# Patient Record
Sex: Male | Born: 1960 | Race: Black or African American | Hispanic: No | Marital: Married | Smoking: Never smoker
Health system: Southern US, Community
[De-identification: ages and names within clinical notes are randomized; demographics above are authoritative.]

## PROBLEM LIST (undated history)

## (undated) DIAGNOSIS — J45909 Unspecified asthma, uncomplicated: Secondary | ICD-10-CM

## (undated) DIAGNOSIS — C801 Malignant (primary) neoplasm, unspecified: Secondary | ICD-10-CM

## (undated) DIAGNOSIS — K219 Gastro-esophageal reflux disease without esophagitis: Secondary | ICD-10-CM

## (undated) DIAGNOSIS — G4730 Sleep apnea, unspecified: Secondary | ICD-10-CM

## (undated) HISTORY — PX: TONSILLECTOMY: SUR1361

## (undated) HISTORY — DX: Unspecified asthma, uncomplicated: J45.909

---

## 2020-04-30 ENCOUNTER — Emergency Department (HOSPITAL_COMMUNITY)
Admission: EM | Admit: 2020-04-30 | Discharge: 2020-04-30 | Disposition: A | Discharge status: Home / Self Care | Payer: BC Managed Care – PPO | Attending: Emergency Medicine | Admitting: Emergency Medicine

## 2020-04-30 ENCOUNTER — Encounter (HOSPITAL_COMMUNITY): Payer: Self-pay | Admitting: Emergency Medicine

## 2020-04-30 ENCOUNTER — Other Ambulatory Visit: Payer: Self-pay

## 2020-04-30 ENCOUNTER — Emergency Department (HOSPITAL_COMMUNITY): Payer: BC Managed Care – PPO

## 2020-04-30 DIAGNOSIS — R221 Localized swelling, mass and lump, neck: Secondary | ICD-10-CM

## 2020-04-30 HISTORY — DX: Gastro-esophageal reflux disease without esophagitis: K21.9

## 2020-04-30 LAB — CBC WITH DIFFERENTIAL/PLATELET
Abs Immature Granulocytes: 0.03 10*3/uL (ref 0.00–0.07)
Basophils Absolute: 0.1 10*3/uL (ref 0.0–0.1)
Basophils Relative: 1 %
Eosinophils Absolute: 0.2 10*3/uL (ref 0.0–0.5)
Eosinophils Relative: 2 %
HCT: 46.2 % (ref 39.0–52.0)
Hemoglobin: 15.4 g/dL (ref 13.0–17.0)
Immature Granulocytes: 0 %
Lymphocytes Relative: 22 %
Lymphs Abs: 2 10*3/uL (ref 0.7–4.0)
MCH: 30.6 pg (ref 26.0–34.0)
MCHC: 33.3 g/dL (ref 30.0–36.0)
MCV: 91.8 fL (ref 80.0–100.0)
Monocytes Absolute: 0.8 10*3/uL (ref 0.1–1.0)
Monocytes Relative: 8 %
Neutro Abs: 6.3 10*3/uL (ref 1.7–7.7)
Neutrophils Relative %: 67 %
Platelets: 215 10*3/uL (ref 150–400)
RBC: 5.03 MIL/uL (ref 4.22–5.81)
RDW: 14.1 % (ref 11.5–15.5)
WBC: 9.4 10*3/uL (ref 4.0–10.5)
nRBC: 0 % (ref 0.0–0.2)

## 2020-04-30 LAB — BASIC METABOLIC PANEL
Anion gap: 9 (ref 5–15)
BUN: 13 mg/dL (ref 6–20)
CO2: 25 mmol/L (ref 22–32)
Calcium: 9.5 mg/dL (ref 8.9–10.3)
Chloride: 103 mmol/L (ref 98–111)
Creatinine, Ser: 1.18 mg/dL (ref 0.61–1.24)
GFR, Estimated: >60 mL/min (ref >60)
Glucose, Bld: 103 mg/dL — ABNORMAL HIGH (ref 70–99)
Potassium: 4.4 mmol/L (ref 3.5–5.1)
Sodium: 137 mmol/L (ref 135–145)

## 2020-04-30 LAB — HEPATIC FUNCTION PANEL
ALT: 24 U/L (ref 0–44)
AST: 23 U/L (ref 15–41)
Albumin: 3.8 g/dL (ref 3.5–5.0)
Alkaline Phosphatase: 52 U/L (ref 38–126)
Bilirubin, Direct: 0.2 mg/dL (ref 0.0–0.2)
Indirect Bilirubin: 0.9 mg/dL (ref 0.3–0.9)
Total Bilirubin: 1.1 mg/dL (ref 0.3–1.2)
Total Protein: 7.4 g/dL (ref 6.5–8.1)

## 2020-04-30 LAB — LIPASE, BLOOD: Lipase: 38 U/L (ref 11–51)

## 2020-04-30 MED ORDER — IOHEXOL 300 MG/ML  SOLN
75.0000 mL | Freq: Once | INTRAMUSCULAR | Status: AC | PRN
  Administered 2020-04-30: 75 mL via INTRAVENOUS

## 2020-04-30 MED ORDER — PREDNISONE 10 MG PO TABS: 20.0000 mg | ORAL_TABLET | Freq: Every day | ORAL | 0 refills | Status: DC

## 2020-04-30 MED ORDER — PREDNISONE 20 MG PO TABS
40.0000 mg | ORAL_TABLET | Freq: Once | ORAL | Status: AC
  Administered 2020-04-30: 40 mg via ORAL
  Filled 2020-04-30: qty 2

## 2020-04-30 NOTE — ED Notes (Signed)
Pt is alert and oriented, speech changes noted that sound like something is swollen in throat, denies any pain at this time but reports radiating pain intermittently from throat into head. Pain increases in evening and pt reports that this swelling began about 4-55months ago and has been getting worse and pain has been increasing, lately he hasz required ibuprofen to be able to sleep at night and has become difficult to understand when speaking on the phone.  Delay in seeking dx due to issues with getting his insurance in [place] after move from [place].

## 2020-04-30 NOTE — ED Triage Notes (Signed)
Patient coming from home. Complaint of neck swelling that has progressively become worse recently. Patient endorses intermittent pain with movement. VSS. NAD.

## 2020-04-30 NOTE — ED Notes (Signed)
Pt verbalized understanding of d/c instructions, medications and strict instructions when to return to ED as well as appointment for f/u with ENT. Pt ambulatory to [facility] with steady gait, NAD

## 2020-04-30 NOTE — ED Provider Notes (Signed)
[facility]ERGENCY DEPARTMENT Provider Note   CSN: [id number] Arrival date & time: 04/30/20  2440     History Chief Complaint  Patient presents with  . Abscess    Alexander [name] is a 60 y.o. male.  HPI   60 year old male history of GERD presents today complaining of left neck swelling.  He states has been present for several months.  The symptoms have worsened.  He feels like it began gradually.  He had some voice change with this.  He has also noted that he has some difficulty swallowing.  He is not having any difficulty breathing.  The mass has been painless.  He has not noted fever or chills.  He has not had anything similar in the past.  He has not sought any previous medical evaluation for this.  Patient is non-smoker and drinks very no alcohol.  He has no history of malignancy  Past Medical History:  Diagnosis Date  . GERD (gastroesophageal reflux disease)     There are no problems to display for this patient.   The histories are not reviewed yet. Please review them in the "History" navigator section and refresh this [place].     No family history on file.     Home Medications Prior to Admission medications   Not on File    Allergies    Patient has no known allergies.  Review of Systems   Review of Systems  Constitutional: Negative for fever and unexpected weight change.  HENT: Positive for trouble swallowing and voice change.   Eyes: Negative.   Respiratory: Negative.   Cardiovascular: Negative.   Gastrointestinal: Negative.   Endocrine: Negative.   Genitourinary: Negative.   Musculoskeletal: Negative.   Skin: Negative.   Allergic/Immunologic: Negative.   Neurological: Negative.   Hematological: Negative.   Psychiatric/Behavioral: Negative.   All other systems reviewed and are negative.   Physical Exam Updated Vital Signs BP (!) 152/84   Pulse 79   Temp 98.4 F (36.9 C) (Oral)   Resp 16   SpO2 97%   Physical  Exam Vitals and nursing note reviewed.  Constitutional:      General: He is not in acute distress.    Appearance: Normal appearance. He is obese.  HENT:     Head: Normocephalic.     Right Ear: External ear normal.     Left Ear: External ear normal.     Nose: Nose normal.     Mouth/Throat:     Mouth: Mucous membranes are moist.     Comments: Left soft palate some swelling No fluctuance or pus noted Eyes:     Extraocular Movements: Extraocular movements intact.     Pupils: Pupils are equal, round, and reactive to light.  Neck:     Comments: Left submandibular with induration palpable Cardiovascular:     Rate and Rhythm: Normal rate and regular rhythm.     Pulses: Normal pulses.  Pulmonary:     Effort: Pulmonary effort is normal.  Abdominal:     General: Abdomen is flat. Bowel sounds are normal.     Palpations: Abdomen is soft.  Musculoskeletal:        General: Normal range of motion.     Cervical back: Normal range of motion. No rigidity.  Skin:    Capillary Refill: Capillary refill takes less than 2 seconds.  Neurological:     General: No focal deficit present.     Mental Status: He is alert.  Psychiatric:  Mood and Affect: Mood normal.     ED Results / Procedures / Treatments   Labs (all labs ordered are listed, but only abnormal results are displayed) Labs Reviewed  BASIC METABOLIC PANEL - Abnormal; Notable for the following components:      Result Value   Glucose, Bld 103 (*)    All other components within normal limits  CBC WITH DIFFERENTIAL/PLATELET  HEPATIC FUNCTION PANEL  LIPASE, BLOOD    EKG None  Radiology No results found.  Procedures Procedures   Medications Ordered in ED Medications - No data to display  ED Course  I have reviewed the triage vital signs and the nursing notes.  Pertinent labs & imaging results that were available during my care of the patient were reviewed by me and considered in my medical decision making (see chart  for details).  Clinical Course as of 04/30/20 1521  Mon Apr 30, 2020  1119 CBC with Differential [DR]  1610 Basic metabolic panel(!) Cbc and bmet reviewed and normal [DR]    Clinical Course User Index [DR] Pattricia Boss, MD   MDM Rules/Calculators/A&P                          Discussed discussed CT results with radiology.  Reviewed CT results personally Consulted with Dr. [name]- Patient's airway appears clear he is speaking without difficulty.  Plan close follow-up  Dr. [name] will see in office at 1230 on Thursday Plan steroids Discussed symptoms that should have the patient return to the emergency department such as difficulty breathing. Final Clinical Impression(s) / ED Diagnoses Final diagnoses:  Neck mass    Rx / DC Orders ED Discharge Orders    None       Pattricia Boss, MD 04/30/20 1534

## 2020-04-30 NOTE — Discharge Instructions (Addendum)
Please follow-up with Dr. [name] as scheduled on Thursday at 17 Return to the emergency department you have any difficulty breathing

## 2020-05-03 ENCOUNTER — Other Ambulatory Visit: Payer: Self-pay

## 2020-05-03 ENCOUNTER — Ambulatory Visit (INDEPENDENT_AMBULATORY_CARE_PROVIDER_SITE_OTHER): Payer: PRIVATE HEALTH INSURANCE | Admitting: Otolaryngology

## 2020-05-03 DIAGNOSIS — D3702 Neoplasm of uncertain behavior of tongue: Secondary | ICD-10-CM

## 2020-05-03 NOTE — Progress Notes (Signed)
HPI: Alexander [name] is a 60 y.o. male who presents is referred by ED for evaluation of left neck mass and left base of tongue mass noted on recent CT scan performed in the ED.  Patient just recently moved here about a year ago from [place].  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  He had seen the ENT in [place] for nasal polyps.  More recently over the last 3 to 4 months he has noticed a nodule in the left neck that is gradually gotten little bit larger.  He denies any sore throat.  He has had no weight loss. He does have history of obstructive sleep apnea but presently does not wear nasal CPAP.[name] Patient does not smoke or drink.  Past Medical History:  Diagnosis Date  . GERD (gastroesophageal reflux disease)    No past surgical history on file. Social History   Socioeconomic History  . Marital status: Single    Spouse name: Not on file  . Number of children: Not on file  . Years of education: Not on file  . Highest education level: Not on file  Occupational History  . Not on file  Tobacco Use  . Smoking status: Not on file  . Smokeless tobacco: Not on file  Substance and Sexual Activity  . Alcohol use: Not on file  . Drug use: Not on file  . Sexual activity: Not on file  Other Topics Concern  . Not on file  Social History Narrative  . Not on file   Social Determinants of Health   Financial Resource Strain: Not on file  Food Insecurity: Not on file  Transportation Needs: Not on file  Physical Activity: Not on file  Stress: Not on file  Social Connections: Not on file   No family history on file. No Known Allergies Prior to Admission medications   Medication Sig Start Date End Date Taking? Authorizing Provider  albuterol (VENTOLIN HFA) 108 (90 Base) MCG/ACT inhaler Inhale 2 puffs into the lungs as needed for wheezing or shortness of breath.    [provider]  Ascorbic Acid (VITAMIN C PO) Take 1 tablet by mouth daily.    [provider]  cetirizine (ZYRTEC) 10  MG tablet Take 10 mg by mouth daily.    [provider]  fluticasone (FLONASE) 50 MCG/ACT nasal spray Place 1 spray into both nostrils daily.    [provider]  Fluticasone-Salmeterol (WIXELA INHUB) 100-50 MCG/DOSE AEPB Inhale 1 puff into the lungs daily.    [provider]  Multiple Vitamin (MULTIVITAMIN ADULT PO) Take 1 tablet by mouth daily.    [provider]  Multiple Vitamins-Minerals (ZINC PO) Take 1 tablet by mouth daily.    [provider]  omeprazole (PRILOSEC) 40 MG capsule Take 40 mg by mouth daily.    [provider]  predniSONE (DELTASONE) 10 MG tablet Take 2 tablets (20 mg total) by mouth daily. 04/30/20   Pattricia Boss, MD  predniSONE (DELTASONE) 10 MG tablet Take 2 tablets (20 mg total) by mouth daily. 04/30/20   Pattricia Boss, MD     Positive ROS: Otherwise negative  All other systems have been reviewed and were otherwise negative with the exception of those mentioned in the HPI and as above.  Physical Exam: Constitutional: Alert, well-appearing, no acute distress.  Presently no hoarseness or airway problems. Ears: External ears without lesions or tenderness. Ear canals are clear bilaterally with intact, clear TMs.  Nasal: External nose without lesions.  Septum with minimal deformity.  Mild rhinitis bilaterally.  On nasal endoscopy patient has small sinonasal polyps that are nonobstructing..  Oral: Lips and gums without lesions. Tongue and palate mucosa without lesions. Posterior oropharynx clear.  Patient has a large tongue base making evaluation of the posterior oropharynx little bit difficult. Fiberoptic laryngoscopy was performed through the right nostril.  He has some polyps within the nasal cavity but these are not obstructing.  The nasopharynx is clear.  The base of tongue reveals a mass and questionable ulceration involving the left side.  It distorts the epiglottis back a little bit posteriorly.  On evaluation [name]  epiglottis the vocal cords were clear bilaterally with symmetric vocal cord mobility. Neck: Patient with firm adenopathy in the left upper neck consistent with metastatic disease. Lungs: Clear to auscultation Cardiac exam: Regular rate and rhythm without murmur. Respiratory: Breathing comfortably  Skin: No facial/neck lesions or rash noted.  Laryngoscopy  Date/Time: 05/03/2020 6:34 PM Performed by: Rozetta Nunnery, MD Authorized by: Rozetta Nunnery, MD   Consent:    Consent obtained:  Verbal   Consent given by:  Patient Procedure details:    Indications: direct visualization of the upper aerodigestive tract     Medication:  Afrin   Instrument: flexible fiberoptic laryngoscope     Scope location: right nare   Sinus:    Right nasopharynx: normal   Throat:    True vocal cords: normal   Comments:     On fiberoptic laryngoscopy through the right nostril the nasopharynx was clear.  The base of tongue was full on the left side and the epiglottis was pushed back posteriorly a little bit with questionable ulcer on the left base of tongue extending down to the left vallecular area.    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  Plan: We will schedule patient for direct laryngoscopy and biopsy next week.  Reviewed with the patient concerning probability of HPV positive squamous cell carcinoma of the base of tongue. We will also plan on scheduling patient for a PET scan. I reviewed the clinical findings and CT scan findings with the patient as well as his daughter.   Radene Journey, MD   CC:

## 2020-05-04 ENCOUNTER — Other Ambulatory Visit (INDEPENDENT_AMBULATORY_CARE_PROVIDER_SITE_OTHER): Payer: Self-pay

## 2020-05-04 DIAGNOSIS — D3702 Neoplasm of uncertain behavior of tongue: Secondary | ICD-10-CM

## 2020-05-07 ENCOUNTER — Other Ambulatory Visit (HOSPITAL_COMMUNITY)
Admission: RE | Admit: 2020-05-07 | Discharge: 2020-05-07 | Disposition: A | Discharge status: Home / Self Care | Payer: BC Managed Care – PPO | Source: Ambulatory Visit | Attending: Otolaryngology | Admitting: Otolaryngology

## 2020-05-07 ENCOUNTER — Ambulatory Visit: Payer: Self-pay | Admitting: Otolaryngology

## 2020-05-07 DIAGNOSIS — Z20822 Contact with and (suspected) exposure to covid-19: Secondary | ICD-10-CM | POA: Insufficient documentation

## 2020-05-07 DIAGNOSIS — Z01812 Encounter for preprocedural laboratory examination: Secondary | ICD-10-CM | POA: Insufficient documentation

## 2020-05-07 NOTE — H&P (View-Only) (Signed)
PREOPERATIVE H&P  Chief Complaint: Left neck and left base of tongue mass  HPI: Alexander [name] is a 60 y.o. male who presents for evaluation of left neck and left base of tongue mass.  Patient was referred by the ED because of an enlarged left neck mass left base of tongue mass with a CT scan demonstrated a large base of tongue mass on the left side and enlarged left neck node consistent with possible carcinoma.  On evaluation in the office patient has a large left neck lymphadenopathy as well as a large partially ulcerative left base of tongue mass.  This was unable to be biopsied in the office.  Patient is taken to the operating room for direct laryngoscopy and biopsy.  Past Medical History:  Diagnosis Date  . GERD (gastroesophageal reflux disease)    No past surgical history on file. Social History   Socioeconomic History  . Marital status: Single    Spouse name: Not on file  . Number of children: Not on file  . Years of education: Not on file  . Highest education level: Not on file  Occupational History  . Not on file  Tobacco Use  . Smoking status: Not on file  . Smokeless tobacco: Not on file  Substance and Sexual Activity  . Alcohol use: Not on file  . Drug use: Not on file  . Sexual activity: Not on file  Other Topics Concern  . Not on file  Social History Narrative  . Not on file   Social Determinants of Health   Financial Resource Strain: Not on file  Food Insecurity: Not on file  Transportation Needs: Not on file  Physical Activity: Not on file  Stress: Not on file  Social Connections: Not on file   No family history on file. No Known Allergies Prior to Admission medications   Medication Sig Start Date End Date Taking? Authorizing Provider  albuterol (VENTOLIN HFA) 108 (90 Base) MCG/ACT inhaler Inhale 2 puffs into the lungs as needed for wheezing or shortness of breath.    [provider]  Ascorbic Acid (VITAMIN C PO) Take 1 tablet by mouth daily.     [provider]  cetirizine (ZYRTEC) 10 MG tablet Take 10 mg by mouth daily.    [provider]  fluticasone (FLONASE) 50 MCG/ACT nasal spray Place 1 spray into both nostrils daily.    [provider]  Fluticasone-Salmeterol (WIXELA INHUB) 100-50 MCG/DOSE AEPB Inhale 1 puff into the lungs daily.    [provider]  Multiple Vitamin (MULTIVITAMIN ADULT PO) Take 1 tablet by mouth daily.    [provider]  Multiple Vitamins-Minerals (ZINC PO) Take 1 tablet by mouth daily.    [provider]  omeprazole (PRILOSEC) 40 MG capsule Take 40 mg by mouth daily.    [provider]  predniSONE (DELTASONE) 10 MG tablet Take 2 tablets (20 mg total) by mouth daily. 04/30/20   Pattricia Boss, MD  predniSONE (DELTASONE) 10 MG tablet Take 2 tablets (20 mg total) by mouth daily. 04/30/20   Pattricia Boss, MD     Positive ROS: Otherwise negative  All other systems have been reviewed and were otherwise negative with the exception of those mentioned in the HPI and as above.  Physical Exam: There were no vitals filed for this visit.  General: Alert, no acute distress Oral: Normal oral mucosa and tonsils.  Patient with a large base of tongue making visualization a little difficult but on fiberoptic laryngoscopy  patient has a mass involving mostly the left side of the base of tongue with some ulceration.  Epiglottis is partially pushed back posteriorly.  Vocal cords were clear with normal vocal mobility. Nasal: Clear nasal passages Neck: No palpable adenopathy or thyroid nodules.  Enlarged left upper neck nodes. Ear: Ear canal is clear with normal appearing TMs Cardiovascular: Regular rate and rhythm, no murmur.  Respiratory: Clear to auscultation Neurologic: Alert and oriented x 3   Assessment/Plan: Left base of tongue mass with left neck lymphadenopathy consistent with probable neoplasm.  Plan for direct laryngoscopy and biopsy of the base of  tongue mass.   Melony Overly, MD 05/07/2020 12:41 PM

## 2020-05-07 NOTE — H&P (Signed)
PREOPERATIVE H&P  Chief Complaint: Left neck and left base of tongue mass  HPI: Alexander [name] is a 60 y.o. male who presents for evaluation of left neck and left base of tongue mass.  Patient was referred by the ED because of an enlarged left neck mass left base of tongue mass with a CT scan demonstrated a large base of tongue mass on the left side and enlarged left neck node consistent with possible carcinoma.  On evaluation in the office patient has a large left neck lymphadenopathy as well as a large partially ulcerative left base of tongue mass.  This was unable to be biopsied in the office.  Patient is taken to the operating room for direct laryngoscopy and biopsy.  Past Medical History:  Diagnosis Date  . GERD (gastroesophageal reflux disease)    No past surgical history on file. Social History   Socioeconomic History  . Marital status: Single    Spouse name: Not on file  . Number of children: Not on file  . Years of education: Not on file  . Highest education level: Not on file  Occupational History  . Not on file  Tobacco Use  . Smoking status: Not on file  . Smokeless tobacco: Not on file  Substance and Sexual Activity  . Alcohol use: Not on file  . Drug use: Not on file  . Sexual activity: Not on file  Other Topics Concern  . Not on file  Social History Narrative  . Not on file   Social Determinants of Health   Financial Resource Strain: Not on file  Food Insecurity: Not on file  Transportation Needs: Not on file  Physical Activity: Not on file  Stress: Not on file  Social Connections: Not on file   No family history on file. No Known Allergies Prior to Admission medications   Medication Sig Start Date End Date Taking? Authorizing Provider  albuterol (VENTOLIN HFA) 108 (90 Base) MCG/ACT inhaler Inhale 2 puffs into the lungs as needed for wheezing or shortness of breath.    [provider]  Ascorbic Acid (VITAMIN C PO) Take 1 tablet by mouth daily.     [provider]  cetirizine (ZYRTEC) 10 MG tablet Take 10 mg by mouth daily.    [provider]  fluticasone (FLONASE) 50 MCG/ACT nasal spray Place 1 spray into both nostrils daily.    [provider]  Fluticasone-Salmeterol (WIXELA INHUB) 100-50 MCG/DOSE AEPB Inhale 1 puff into the lungs daily.    [provider]  Multiple Vitamin (MULTIVITAMIN ADULT PO) Take 1 tablet by mouth daily.    [provider]  Multiple Vitamins-Minerals (ZINC PO) Take 1 tablet by mouth daily.    [provider]  omeprazole (PRILOSEC) 40 MG capsule Take 40 mg by mouth daily.    [provider]  predniSONE (DELTASONE) 10 MG tablet Take 2 tablets (20 mg total) by mouth daily. 04/30/20   Pattricia Boss, MD  predniSONE (DELTASONE) 10 MG tablet Take 2 tablets (20 mg total) by mouth daily. 04/30/20   Pattricia Boss, MD     Positive ROS: Otherwise negative  All other systems have been reviewed and were otherwise negative with the exception of those mentioned in the HPI and as above.  Physical Exam: There were no vitals filed for this visit.  General: Alert, no acute distress Oral: Normal oral mucosa and tonsils.  Patient with a large base of tongue making visualization a little difficult but on fiberoptic laryngoscopy  patient has a mass involving mostly the left side of the base of tongue with some ulceration.  Epiglottis is partially pushed back posteriorly.  Vocal cords were clear with normal vocal mobility. Nasal: Clear nasal passages Neck: No palpable adenopathy or thyroid nodules.  Enlarged left upper neck nodes. Ear: Ear canal is clear with normal appearing TMs Cardiovascular: Regular rate and rhythm, no murmur.  Respiratory: Clear to auscultation Neurologic: Alert and oriented x 3   Assessment/Plan: Left base of tongue mass with left neck lymphadenopathy consistent with probable neoplasm.  Plan for direct laryngoscopy and biopsy of the base of  tongue mass.   Melony Overly, MD 05/07/2020 12:41 PM

## 2020-05-08 ENCOUNTER — Other Ambulatory Visit: Payer: Self-pay

## 2020-05-08 ENCOUNTER — Encounter (HOSPITAL_COMMUNITY): Payer: Self-pay | Admitting: Otolaryngology

## 2020-05-08 LAB — SARS CORONAVIRUS 2 (TAT 6-24 HRS): SARS Coronavirus 2: NEGATIVE

## 2020-05-08 MED ORDER — DEXTROSE 5 % IV SOLN
3.0000 g | INTRAVENOUS | Status: DC
  Filled 2020-05-08: qty 3000

## 2020-05-08 NOTE — Progress Notes (Signed)
PCP - denies - just moved from [place] and has not been able to establish new providers yet d/t covid delays Cardiologist - per pt, has some cardiology work-ups in [place] and will bring copies tomorrow to add into chart - per pt no significant heart hx and is not on any cardiac medications  Chest x-ray -  EKG -  Stress Test -  ECHO -  Cardiac Cath -   COVID TEST- 05/07/20  Anesthesia review: n/a  -------------  SDW INSTRUCTIONS:  Your procedure is scheduled on 05/09/20. Please report to [facility] Main Entrance "A" at 10:45 A.M., and check in at the Admitting office. Call this number if you have problems the morning of surgery: [phone]   Remember: Do not eat or drink after midnight the night before your surgery   Medications to take morning of surgery with a sip of water include: cetirizine (ZYRTEC)  predniSONE (DELTASONE)  omeprazole (PRILOSEC)  fluticasone (FLONASE)   If needed: albuterol (VENTOLIN HFA)    As of today, STOP taking any Aspirin (unless otherwise instructed by your surgeon), Aleve, Naproxen, Ibuprofen, Motrin, Advil, Goody's, BC's, all herbal medications, fish oil, and all vitamins.    The Morning of Surgery Do not wear jewelry Do not wear lotions, powders, colognes, or deodorant Men may shave face and neck. Do not bring valuables to the hospital. [facility] is not responsible for any belongings or valuables. If you are a smoker, DO NOT Smoke 24 hours prior to surgery If you wear a CPAP at night please bring your mask the morning of surgery  Remember that you must have someone to transport you home after your surgery, and remain with you for 24 hours if you are discharged the same day. Please bring cases for contacts, glasses, hearing aids, dentures or bridgework because it cannot be worn into surgery.   Patients discharged the day of surgery will not be allowed to drive home.   Please shower the NIGHT BEFORE SURGERY and the MORNING OF SURGERY with DIAL  Soap. Wear comfortable clothes the morning of surgery. Oral Hygiene is also important to reduce your risk of infection.  Remember - BRUSH YOUR TEETH THE MORNING OF SURGERY WITH YOUR REGULAR TOOTHPASTE  Patient denies shortness of breath, fever, cough and chest pain.

## 2020-05-09 ENCOUNTER — Inpatient Hospital Stay (HOSPITAL_COMMUNITY)
Admission: AD | Admit: 2020-05-09 | Discharge: 2020-05-12 | DRG: 011 | Disposition: A | Discharge status: Home / Self Care | Payer: BC Managed Care – PPO | Attending: Internal Medicine | Admitting: Internal Medicine

## 2020-05-09 ENCOUNTER — Encounter (HOSPITAL_COMMUNITY)
Admission: AD | Disposition: A | Discharge status: Home / Self Care | Payer: Self-pay | Source: Home / Self Care | Attending: Internal Medicine

## 2020-05-09 ENCOUNTER — Encounter (HOSPITAL_COMMUNITY): Payer: Self-pay | Admitting: Otolaryngology

## 2020-05-09 ENCOUNTER — Ambulatory Visit (HOSPITAL_COMMUNITY): Payer: BC Managed Care – PPO | Admitting: Anesthesiology

## 2020-05-09 DIAGNOSIS — R001 Bradycardia, unspecified: Secondary | ICD-10-CM | POA: Diagnosis present

## 2020-05-09 DIAGNOSIS — Z79899 Other long term (current) drug therapy: Secondary | ICD-10-CM | POA: Diagnosis not present

## 2020-05-09 DIAGNOSIS — K148 Other diseases of tongue: Secondary | ICD-10-CM | POA: Diagnosis not present

## 2020-05-09 DIAGNOSIS — D72829 Elevated white blood cell count, unspecified: Secondary | ICD-10-CM | POA: Diagnosis present

## 2020-05-09 DIAGNOSIS — E669 Obesity, unspecified: Secondary | ICD-10-CM | POA: Diagnosis present

## 2020-05-09 DIAGNOSIS — R634 Abnormal weight loss: Secondary | ICD-10-CM | POA: Diagnosis not present

## 2020-05-09 DIAGNOSIS — R590 Localized enlarged lymph nodes: Secondary | ICD-10-CM | POA: Diagnosis present

## 2020-05-09 DIAGNOSIS — C01 Malignant neoplasm of base of tongue: Secondary | ICD-10-CM | POA: Diagnosis present

## 2020-05-09 DIAGNOSIS — Z6836 Body mass index (BMI) 36.0-36.9, adult: Secondary | ICD-10-CM

## 2020-05-09 DIAGNOSIS — Z7952 Long term (current) use of systemic steroids: Secondary | ICD-10-CM | POA: Diagnosis not present

## 2020-05-09 DIAGNOSIS — G4733 Obstructive sleep apnea (adult) (pediatric): Secondary | ICD-10-CM | POA: Diagnosis present

## 2020-05-09 DIAGNOSIS — Z8042 Family history of malignant neoplasm of prostate: Secondary | ICD-10-CM

## 2020-05-09 DIAGNOSIS — Z7951 Long term (current) use of inhaled steroids: Secondary | ICD-10-CM

## 2020-05-09 DIAGNOSIS — K219 Gastro-esophageal reflux disease without esophagitis: Secondary | ICD-10-CM | POA: Diagnosis present

## 2020-05-09 DIAGNOSIS — Z430 Encounter for attention to tracheostomy: Secondary | ICD-10-CM

## 2020-05-09 DIAGNOSIS — C109 Malignant neoplasm of oropharynx, unspecified: Secondary | ICD-10-CM | POA: Diagnosis not present

## 2020-05-09 DIAGNOSIS — J9601 Acute respiratory failure with hypoxia: Secondary | ICD-10-CM | POA: Diagnosis present

## 2020-05-09 DIAGNOSIS — Z20822 Contact with and (suspected) exposure to covid-19: Secondary | ICD-10-CM | POA: Diagnosis present

## 2020-05-09 DIAGNOSIS — J988 Other specified respiratory disorders: Secondary | ICD-10-CM | POA: Diagnosis present

## 2020-05-09 DIAGNOSIS — J45909 Unspecified asthma, uncomplicated: Secondary | ICD-10-CM | POA: Diagnosis present

## 2020-05-09 DIAGNOSIS — R221 Localized swelling, mass and lump, neck: Secondary | ICD-10-CM | POA: Diagnosis not present

## 2020-05-09 DIAGNOSIS — C029 Malignant neoplasm of tongue, unspecified: Secondary | ICD-10-CM

## 2020-05-09 DIAGNOSIS — Z930 Tracheostomy status: Secondary | ICD-10-CM

## 2020-05-09 DIAGNOSIS — R1319 Other dysphagia: Secondary | ICD-10-CM | POA: Diagnosis not present

## 2020-05-09 HISTORY — PX: MASS EXCISION: SHX2000

## 2020-05-09 HISTORY — PX: TRACHEOSTOMY TUBE PLACEMENT: SHX814

## 2020-05-09 HISTORY — DX: Sleep apnea, unspecified: G47.30

## 2020-05-09 HISTORY — PX: DIRECT LARYNGOSCOPY: SHX5326

## 2020-05-09 LAB — GLUCOSE, CAPILLARY
Glucose-Capillary: 131 mg/dL — ABNORMAL HIGH (ref 70–99)
Glucose-Capillary: 136 mg/dL — ABNORMAL HIGH (ref 70–99)
Glucose-Capillary: 143 mg/dL — ABNORMAL HIGH (ref 70–99)

## 2020-05-09 LAB — MRSA PCR SCREENING: MRSA by PCR: NEGATIVE

## 2020-05-09 SURGERY — EXCISION MASS
Anesthesia: General | Site: Throat

## 2020-05-09 MED ORDER — FENTANYL CITRATE (PF) 100 MCG/2ML IJ SOLN
INTRAMUSCULAR | Status: DC | PRN
  Administered 2020-05-09 (×3): 50 ug via INTRAVENOUS
  Administered 2020-05-09: 150 ug via INTRAVENOUS
  Administered 2020-05-09: 50 ug via INTRAVENOUS

## 2020-05-09 MED ORDER — CHLORHEXIDINE GLUCONATE CLOTH 2 % EX PADS: 6.0000 | MEDICATED_PAD | Freq: Once | CUTANEOUS | Status: DC

## 2020-05-09 MED ORDER — MIDAZOLAM HCL 2 MG/2ML IJ SOLN
INTRAMUSCULAR | Status: AC
  Filled 2020-05-09: qty 2

## 2020-05-09 MED ORDER — PANTOPRAZOLE SODIUM 40 MG IV SOLR
40.0000 mg | Freq: Two times a day (BID) | INTRAVENOUS | Status: DC
  Administered 2020-05-09: 40 mg via INTRAVENOUS
  Filled 2020-05-09: qty 40

## 2020-05-09 MED ORDER — CEFAZOLIN SODIUM-DEXTROSE 1-4 GM/50ML-% IV SOLN
1.0000 g | Freq: Three times a day (TID) | INTRAVENOUS | Status: DC
  Administered 2020-05-09 – 2020-05-10 (×2): 1 g via INTRAVENOUS
  Filled 2020-05-09 (×3): qty 50

## 2020-05-09 MED ORDER — FENTANYL CITRATE (PF) 100 MCG/2ML IJ SOLN
25.0000 ug | INTRAMUSCULAR | Status: DC | PRN
  Administered 2020-05-09: 25 ug via INTRAVENOUS

## 2020-05-09 MED ORDER — ALBUTEROL SULFATE HFA 108 (90 BASE) MCG/ACT IN AERS
2.0000 | INHALATION_SPRAY | RESPIRATORY_TRACT | Status: DC | PRN
  Filled 2020-05-09: qty 6.7

## 2020-05-09 MED ORDER — SUGAMMADEX SODIUM 200 MG/2ML IV SOLN
INTRAVENOUS | Status: DC | PRN
  Administered 2020-05-09: 400 mg via INTRAVENOUS

## 2020-05-09 MED ORDER — LIDOCAINE-EPINEPHRINE 1 %-1:100000 IJ SOLN
INTRAMUSCULAR | Status: DC | PRN
  Administered 2020-05-09: 10 mL

## 2020-05-09 MED ORDER — GLYCOPYRROLATE PF 0.2 MG/ML IJ SOSY
PREFILLED_SYRINGE | INTRAMUSCULAR | Status: AC
  Filled 2020-05-09: qty 1

## 2020-05-09 MED ORDER — OXYCODONE HCL 5 MG/5ML PO SOLN
5.0000 mg | Freq: Once | ORAL | Status: DC | PRN
Start: 2020-05-09 — End: 2020-05-09

## 2020-05-09 MED ORDER — IBUPROFEN 100 MG/5ML PO SUSP: 400.0000 mg | Freq: Four times a day (QID) | ORAL | Status: DC | PRN

## 2020-05-09 MED ORDER — FENTANYL CITRATE (PF) 100 MCG/2ML IJ SOLN: 50.0000 ug | INTRAMUSCULAR | Status: DC | PRN

## 2020-05-09 MED ORDER — PANTOPRAZOLE SODIUM 40 MG PO TBEC: 40.0000 mg | DELAYED_RELEASE_TABLET | Freq: Every day | ORAL | Status: DC

## 2020-05-09 MED ORDER — EPINEPHRINE 1 MG/10ML IJ SOSY
PREFILLED_SYRINGE | INTRAMUSCULAR | Status: DC | PRN
  Administered 2020-05-09 (×3): .1 mg via INTRAVENOUS

## 2020-05-09 MED ORDER — DEXTROSE 5 % IV SOLN
INTRAVENOUS | Status: DC | PRN
  Administered 2020-05-09: 3 g via INTRAVENOUS

## 2020-05-09 MED ORDER — DEXMEDETOMIDINE (PRECEDEX) IN NS 20 MCG/5ML (4 MCG/ML) IV SYRINGE
PREFILLED_SYRINGE | INTRAVENOUS | Status: DC | PRN
  Administered 2020-05-09: 12 ug via INTRAVENOUS
  Administered 2020-05-09: 8 ug via INTRAVENOUS

## 2020-05-09 MED ORDER — HYDROMORPHONE HCL 1 MG/ML IJ SOLN: 0.2000 mg | INTRAMUSCULAR | Status: DC | PRN

## 2020-05-09 MED ORDER — ALBUMIN HUMAN 5 % IV SOLN: INTRAVENOUS | Status: DC | PRN

## 2020-05-09 MED ORDER — FENTANYL CITRATE (PF) 100 MCG/2ML IJ SOLN
INTRAMUSCULAR | Status: AC
  Filled 2020-05-09: qty 2

## 2020-05-09 MED ORDER — DEXAMETHASONE SODIUM PHOSPHATE 10 MG/ML IJ SOLN
INTRAMUSCULAR | Status: DC | PRN
  Administered 2020-05-09: 10 mg via INTRAVENOUS

## 2020-05-09 MED ORDER — HYDROCODONE-ACETAMINOPHEN 7.5-325 MG/15ML PO SOLN: 10.0000 mL | ORAL | Status: DC | PRN

## 2020-05-09 MED ORDER — ORAL CARE MOUTH RINSE: 15.0000 mL | Freq: Once | OROMUCOSAL | Status: AC

## 2020-05-09 MED ORDER — OXYCODONE HCL 5 MG PO TABS
5.0000 mg | ORAL_TABLET | Freq: Once | ORAL | Status: DC | PRN
Start: 2020-05-09 — End: 2020-05-09

## 2020-05-09 MED ORDER — MORPHINE SULFATE (PF) 2 MG/ML IV SOLN: 2.0000 mg | INTRAVENOUS | Status: DC | PRN

## 2020-05-09 MED ORDER — PHENYLEPHRINE HCL-NACL 10-0.9 MG/250ML-% IV SOLN
INTRAVENOUS | Status: DC | PRN
  Administered 2020-05-09: 50 ug/min via INTRAVENOUS

## 2020-05-09 MED ORDER — FENTANYL CITRATE (PF) 250 MCG/5ML IJ SOLN
INTRAMUSCULAR | Status: AC
  Filled 2020-05-09: qty 5

## 2020-05-09 MED ORDER — KCL IN DEXTROSE-NACL 20-5-0.45 MEQ/L-%-% IV SOLN
INTRAVENOUS | Status: DC
  Filled 2020-05-09: qty 1000

## 2020-05-09 MED ORDER — TRANEXAMIC ACID FOR INHALATION
500.0000 mg | Freq: Two times a day (BID) | RESPIRATORY_TRACT | Status: DC | PRN
  Filled 2020-05-09: qty 10

## 2020-05-09 MED ORDER — ONDANSETRON HCL 4 MG/2ML IJ SOLN: 4.0000 mg | INTRAMUSCULAR | Status: DC | PRN

## 2020-05-09 MED ORDER — PHENYLEPHRINE 40 MCG/ML (10ML) SYRINGE FOR IV PUSH (FOR BLOOD PRESSURE SUPPORT)
PREFILLED_SYRINGE | INTRAVENOUS | Status: DC | PRN
  Administered 2020-05-09 (×2): 120 ug via INTRAVENOUS
  Administered 2020-05-09 (×2): 80 ug via INTRAVENOUS

## 2020-05-09 MED ORDER — STERILE WATER FOR IRRIGATION IR SOLN
Status: DC | PRN
  Administered 2020-05-09: 1000 mL

## 2020-05-09 MED ORDER — BACITRACIN ZINC 500 UNIT/GM EX OINT
1.0000 "application " | TOPICAL_OINTMENT | Freq: Three times a day (TID) | CUTANEOUS | Status: DC
  Administered 2020-05-09 – 2020-05-12 (×9): 1 via TOPICAL
  Filled 2020-05-09: qty 28.4

## 2020-05-09 MED ORDER — LIDOCAINE 2% (20 MG/ML) 5 ML SYRINGE
INTRAMUSCULAR | Status: DC | PRN
  Administered 2020-05-09: 60 mg via INTRAVENOUS

## 2020-05-09 MED ORDER — SUCCINYLCHOLINE CHLORIDE 200 MG/10ML IV SOSY
PREFILLED_SYRINGE | INTRAVENOUS | Status: DC | PRN
  Administered 2020-05-09: 200 mg via INTRAVENOUS

## 2020-05-09 MED ORDER — OXYMETAZOLINE HCL 0.05 % NA SOLN
NASAL | Status: AC
  Filled 2020-05-09: qty 30

## 2020-05-09 MED ORDER — CHLORHEXIDINE GLUCONATE 0.12 % MT SOLN
15.0000 mL | Freq: Once | OROMUCOSAL | Status: AC
  Administered 2020-05-09: 15 mL via OROMUCOSAL
  Filled 2020-05-09: qty 15

## 2020-05-09 MED ORDER — LACTATED RINGERS IV SOLN: INTRAVENOUS | Status: DC

## 2020-05-09 MED ORDER — PROPOFOL 10 MG/ML IV BOLUS
INTRAVENOUS | Status: DC | PRN
  Administered 2020-05-09: 40 mg via INTRAVENOUS
  Administered 2020-05-09: 150 mg via INTRAVENOUS

## 2020-05-09 MED ORDER — ACETAMINOPHEN 500 MG PO TABS: 1000.0000 mg | ORAL_TABLET | Freq: Once | ORAL | Status: DC

## 2020-05-09 MED ORDER — PROPOFOL 10 MG/ML IV BOLUS
INTRAVENOUS | Status: AC
  Filled 2020-05-09: qty 20

## 2020-05-09 MED ORDER — ONDANSETRON HCL 4 MG/2ML IJ SOLN: 4.0000 mg | Freq: Once | INTRAMUSCULAR | Status: DC | PRN

## 2020-05-09 MED ORDER — LORAZEPAM 2 MG/ML IJ SOLN: 1.0000 mg | Freq: Every evening | INTRAMUSCULAR | Status: DC | PRN

## 2020-05-09 MED ORDER — MIDAZOLAM HCL 2 MG/2ML IJ SOLN
INTRAMUSCULAR | Status: DC | PRN
  Administered 2020-05-09 (×3): 2 mg via INTRAVENOUS

## 2020-05-09 MED ORDER — LIDOCAINE-EPINEPHRINE 1 %-1:100000 IJ SOLN
INTRAMUSCULAR | Status: AC
  Filled 2020-05-09: qty 1

## 2020-05-09 MED ORDER — ROCURONIUM BROMIDE 10 MG/ML (PF) SYRINGE
PREFILLED_SYRINGE | INTRAVENOUS | Status: DC | PRN
  Administered 2020-05-09: 60 mg via INTRAVENOUS

## 2020-05-09 MED ORDER — ONDANSETRON HCL 4 MG/2ML IJ SOLN
INTRAMUSCULAR | Status: DC | PRN
  Administered 2020-05-09: 4 mg via INTRAVENOUS

## 2020-05-09 MED ORDER — ONDANSETRON HCL 4 MG PO TABS: 4.0000 mg | ORAL_TABLET | ORAL | Status: DC | PRN

## 2020-05-09 MED ORDER — 0.9 % SODIUM CHLORIDE (POUR BTL) OPTIME
TOPICAL | Status: DC | PRN
  Administered 2020-05-09: 1000 mL

## 2020-05-09 MED ORDER — ESMOLOL HCL 100 MG/10ML IV SOLN
INTRAVENOUS | Status: DC | PRN
  Administered 2020-05-09: 60 mg via INTRAVENOUS

## 2020-05-09 SURGICAL SUPPLY — 61 items
ATTRACTOMAT 16X20 MAGNETIC DRP (DRAPES) IMPLANT
BLADE SURG 15 STRL LF DISP TIS (BLADE) IMPLANT
BLADE SURG 15 STRL SS (BLADE)
BNDG CONFORM 2 STRL LF (GAUZE/BANDAGES/DRESSINGS) IMPLANT
BNDG GAUZE ELAST 4 BULKY (GAUZE/BANDAGES/DRESSINGS) IMPLANT
CANISTER SUCT 3000ML PPV (MISCELLANEOUS) IMPLANT
CATH ROBINSON RED A/P 16FR (CATHETERS) IMPLANT
CLEANER TIP ELECTROSURG 2X2 (MISCELLANEOUS) ×4 IMPLANT
CNTNR URN SCR LID CUP LEK RST (MISCELLANEOUS) ×6 IMPLANT
CONT SPEC 4OZ STRL OR WHT (MISCELLANEOUS) ×2
COVER SURGICAL LIGHT HANDLE (MISCELLANEOUS) ×4 IMPLANT
COVER WAND RF STERILE (DRAPES) ×4 IMPLANT
DERMABOND ADVANCED (GAUZE/BANDAGES/DRESSINGS) ×1
DERMABOND ADVANCED .7 DNX12 (GAUZE/BANDAGES/DRESSINGS) ×3 IMPLANT
DRAIN PENROSE 1/4X12 LTX STRL (WOUND CARE) IMPLANT
DRAIN SNY 10 ROU (WOUND CARE) IMPLANT
DRAIN SNY 7 FPER (WOUND CARE) IMPLANT
DRAPE HALF SHEET 40X57 (DRAPES) IMPLANT
DRSG EMULSION OIL 3X3 NADH (GAUZE/BANDAGES/DRESSINGS) IMPLANT
DRSG MEPILEX BORD LITE 2X5 (GAUZE/BANDAGES/DRESSINGS) ×4 IMPLANT
ELECT COATED BLADE 2.86 ST (ELECTRODE) ×8 IMPLANT
ELECT NEEDLE TIP 2.8 STRL (NEEDLE) IMPLANT
ELECT REM PT RETURN 9FT ADLT (ELECTROSURGICAL) ×4
ELECTRODE REM PT RTRN 9FT ADLT (ELECTROSURGICAL) ×3 IMPLANT
GAUZE 4X4 16PLY RFD (DISPOSABLE) ×4 IMPLANT
GAUZE SPONGE 4X4 12PLY STRL (GAUZE/BANDAGES/DRESSINGS) IMPLANT
GLOVE ECLIPSE 7.5 STRL STRAW (GLOVE) ×4 IMPLANT
GOWN STRL REUS W/ TWL LRG LVL3 (GOWN DISPOSABLE) ×6 IMPLANT
GOWN STRL REUS W/ TWL XL LVL3 (GOWN DISPOSABLE) ×3 IMPLANT
GOWN STRL REUS W/TWL LRG LVL3 (GOWN DISPOSABLE) ×2
GOWN STRL REUS W/TWL XL LVL3 (GOWN DISPOSABLE) ×1
KIT BASIN OR (CUSTOM PROCEDURE TRAY) ×4 IMPLANT
KIT TURNOVER KIT B (KITS) ×4 IMPLANT
NEEDLE 25GX 5/8IN NON SAFETY (NEEDLE) IMPLANT
NEEDLE HYPO 25GX1X1/2 BEV (NEEDLE) IMPLANT
NS IRRIG 1000ML POUR BTL (IV SOLUTION) ×4 IMPLANT
PAD ARMBOARD 7.5X6 YLW CONV (MISCELLANEOUS) ×8 IMPLANT
PENCIL FOOT CONTROL (ELECTRODE) ×4 IMPLANT
POSITIONER HEAD DONUT 9IN (MISCELLANEOUS) IMPLANT
SPONGE DRAIN TRACH 4X4 STRL 2S (GAUZE/BANDAGES/DRESSINGS) ×4 IMPLANT
SPONGE NEURO XRAY DETECT 1X3 (DISPOSABLE) ×4 IMPLANT
SUT CHROMIC 4 0 P 3 18 (SUTURE) ×4 IMPLANT
SUT ETHILON 4 0 PS 2 18 (SUTURE) ×4 IMPLANT
SUT ETHILON 5 0 P 3 18 (SUTURE) ×1
SUT NYLON ETHILON 5-0 P-3 1X18 (SUTURE) ×3 IMPLANT
SUT SILK 2 0 SH CR/8 (SUTURE) ×4 IMPLANT
SUT SILK 4 0 (SUTURE) ×1
SUT SILK 4-0 18XBRD TIE 12 (SUTURE) ×3 IMPLANT
SWAB COLLECTION DEVICE MRSA (MISCELLANEOUS) IMPLANT
SWAB CULTURE ESWAB REG 1ML (MISCELLANEOUS) IMPLANT
SYR 10ML LL (SYRINGE) ×4 IMPLANT
SYR BULB IRRIG 60ML STRL (SYRINGE) IMPLANT
SYR CONTROL 10ML LL (SYRINGE) ×4 IMPLANT
SYR TB 1ML LUER SLIP (SYRINGE) IMPLANT
TOWEL GREEN STERILE FF (TOWEL DISPOSABLE) ×4 IMPLANT
TRAY ENT MC OR (CUSTOM PROCEDURE TRAY) ×4 IMPLANT
TUBE CONNECTING 20X1/4 (TUBING) ×4 IMPLANT
TUBE TRACH  6.0 CUFF FLEX (MISCELLANEOUS) ×1
TUBE TRACH 6.0 CUFF FLEX (MISCELLANEOUS) ×3 IMPLANT
WATER STERILE IRR 1000ML POUR (IV SOLUTION) ×4 IMPLANT
YANKAUER SUCT BULB TIP NO VENT (SUCTIONS) ×4 IMPLANT

## 2020-05-09 NOTE — Addendum Note (Signed)
Addendum  created 05/09/20 1628 by Pervis Hocking, DO   Order list changed, Pharmacy for encounter modified

## 2020-05-09 NOTE — Anesthesia Postprocedure Evaluation (Signed)
Anesthesia Post Note  Patient: Alexander [name]  Procedure(s) Performed: EXCISION TONGUE MASS (N/A ) DIRECT LARYNGOSCOPY (Throat) TRACHEOSTOMY (Neck)     Patient location during evaluation: PACU Anesthesia Type: General Level of consciousness: awake and alert, oriented and patient cooperative Pain management: pain level controlled Vital Signs Assessment: post-procedure vital signs reviewed and stable Respiratory status: spontaneous breathing, nonlabored ventilation, respiratory function stable and patient connected to tracheostomy mask oxygen Cardiovascular status: blood pressure returned to baseline and stable Postop Assessment: no apparent nausea or vomiting Anesthetic complications: yes (unable to intubate/ventilate) Comments: Unable to ventilate, extremely difficult mask ventilation 2/2 airway mass. Emergent tracheostomy intraoperatively. Pt alert and oriented in PACU, hemodynamically stable on tracheostomy collar.    No complications documented.  Last Vitals:  Vitals:   05/09/20 1104 05/09/20 1545  BP: (!) 140/95 (!) 142/82  Pulse: 97 86  Resp: 18 17  Temp: 37.1 C (!) 36.2 C  SpO2: 98% 95%    Last Pain:  Vitals:   05/09/20 1545  TempSrc:   PainSc: 0-No pain                 Pervis Hocking

## 2020-05-09 NOTE — Anesthesia Preprocedure Evaluation (Addendum)
Anesthesia Evaluation  Patient identified by MRN, date of birth, ID band Patient awake    Reviewed: Allergy & Precautions, NPO status , Patient's Chart, lab work & pertinent test results  Airway Mallampati: III  TM Distance: >3 FB Neck ROM: Full  Mouth opening: Limited Mouth Opening Comment: Limited mouth opening 2/2 neck swelling L side. Pt states that the limited mouth opening is not secondary to pain, but rather he states he simply cannot open further Neck extension relatively normal but left angle of mandible obscured by swelling Dental  (+) Teeth Intact, Missing, Dental Advisory Given,    Pulmonary sleep apnea (no CPAP for years) ,    Pulmonary exam normal breath sounds clear to auscultation       Cardiovascular negative cardio ROS Normal cardiovascular exam Rhythm:Regular Rate:Normal     Neuro/Psych negative neurological ROS  negative psych ROS   GI/Hepatic Neg liver ROS, GERD  Medicated and Controlled,  Endo/Other  Obesity BMI 37  Renal/GU negative Renal ROS  negative genitourinary   Musculoskeletal negative musculoskeletal ROS (+)   Abdominal (+) + obese,   Peds  Hematology negative hematology ROS (+)   Anesthesia Other Findings IMPRESSION: Large soft tissue mass centered in the region of the left tongue base also extending posteriorly to the glossotonsillar sulcus and vallecula. This portion of the mass has poorly defined margins but appears to cross midline and is estimated to measure at least 5.0 x 4.7 x 3.4 cm. The epiglottis and aryepiglottic folds are not well separately delineated and may be involved by the mass. Suspect tumor extension inferiorly at least to the level of the supraglottic larynx. There is also apparent tumor extension on the right into the strap muscles at the level of the supraglottic larynx. Also of note, there is an infiltrated appearance of the pre-epiglottic fat and a more  subtly infiltrated appearance of the paraglottic fat concerning for tumor infiltration. Furthermore, there is asymmetric prominence of the left piriformis sinus suspicious for left vocal cord palsy. ENT consultation is recommended.  Results in moderate/severe effacement of the oropharyngeal airway.  Nonspecific edema within the floor of mouth/submandibular space.  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 There is concern for extracapsular extension, as described.  The mid left internal jugular vein is markedly effaced by lymphadenopathy.  Reproductive/Obstetrics negative OB ROS                           Anesthesia Physical Anesthesia Plan  ASA: IV  Anesthesia Plan: General   Post-op Pain Management:    Induction: Inhalational and Intravenous  PONV Risk Score and Plan: 3 and Ondansetron, Dexamethasone, Midazolam and Treatment may vary due to age or medical condition  Airway Management Planned: Oral ETT, Awake Intubation Planned and Fiberoptic Intubation Planned  Additional Equipment: None  Intra-op Plan:   Post-operative Plan: Possible Post-op intubation/ventilation and Extubation in OR  Informed Consent: I have reviewed the patients History and Physical, chart, labs and discussed the procedure including the risks, benefits and alternatives for the proposed anesthesia with the patient or authorized representative who has indicated his/her understanding and acceptance.     Dental advisory given  Plan Discussed with: CRNA  Anesthesia Plan Comments: (High likelihood of difficult airway given "moderate-severe effacement of the oropharyngeal airway" by mass on CT. D/w patient "awake" intubation, with minimal sedation and he is reluctant but agreeable. D/w surgeon, who is requesting inducing general anesthesia normally and attempting glidescope  intubation to the right side of the mouth which is thought to  be unaffected by mass. Backup plan, per surgeon, is rigid bronchoscopy. tracheostomy tray available. )      Anesthesia Quick Evaluation

## 2020-05-09 NOTE — Brief Op Note (Signed)
05/09/2020  3:23 PM  PATIENT:  Alexander [name]  60 y.o. male  PRE-OPERATIVE DIAGNOSIS:  TONGUE MASS  POST-OPERATIVE DIAGNOSIS:  TONGUE MASS  PROCEDURE:  Procedure(s): EXCISION TONGUE MASS (N/A) DIRECT LARYNGOSCOPY TRACHEOSTOMY Emergent  SURGEON:  Surgeon(s) and Role:    Rozetta Nunnery, MD - Primary  PHYSICIAN ASSISTANT:   ASSISTANTS: none   ANESTHESIA:   general  EBL:  50 cc   BLOOD ADMINISTERED:none  DRAINS: none   LOCAL MEDICATIONS USED:  XYLOCAINE   SPECIMEN:  Source of Specimen:  base of tongue  DISPOSITION OF SPECIMEN:  PATHOLOGY  COUNTS:  YES  TOURNIQUET:  * No tourniquets in log *  DICTATION: .Other Dictation: Dictation Number [id number]  PLAN OF CARE: Admit to inpatient   PATIENT DISPOSITION:  PACU - hemodynamically stable.   Delay start of Pharmacological VTE agent (>24hrs) due to surgical blood loss or risk of bleeding: yes

## 2020-05-09 NOTE — Op Note (Signed)
Alexander [name], Alexander [name] MEDICAL RECORD NO: [id number] ACCOUNT NO: [id number] DATE OF BIRTH: [birth year] FACILITY: [facility] LOCATION: [facility] PHYSICIAN: Leonides Sake. [name], MD  Operative Report   DATE OF PROCEDURE: 05/09/2020  PREOPERATIVE DIAGNOSIS:  Base of tongue mass.  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  OPERATION PERFORMED:  Emergent tracheostomy.  Direct laryngoscopy and biopsy.  SURGEON:  Leonides Sake. [name], MD  ANESTHESIA:  General via tracheostomy.  BLOOD LOSS:  50 mL.  COMPLICATIONS:  The patient required emergent tracheostomy during the case.  BRIEF CLINICAL NOTE:  The patient is a 61 year old gentleman who was sent to me via the Emergency Department because of large left neck mass.  CT scan was performed and showed a large base of tongue mass.  This was consistent with probable carcinoma.  On  fiberoptic laryngoscopy in the office, the patient had a large base of tongue mass that pushed the epiglottis back a little bit posteriorly.  On exam of the vocal cords, vocal cords were clear with normal vocal cord mobility otherwise.  The patient has  been scheduled for a PET scan, but is taken to the operating room at this time for direct laryngoscopy and biopsy to get a tissue diagnosis.  DESCRIPTION OF PROCEDURE:  The patient was brought to the operating room and placed in the supine position.  Rapid sequence intubation was attempted, but was unable to adequately visualize the vocal cords.  The patient was a large patient, and the base  of tongue mass was large preventing adequate displacement of the tongue anteriorly.  One blind intubation was attempted, but this was not within the trachea.  Because of bleeding, further mask ventilation was very difficult, and his O2 sats were  dropping, and emergent tracheostomy was recommended.  The neck was quickly injected with Xylocaine with epinephrine and Betadine applied to the neck.   Vertical incision was made at midline neck just at the level of the cricoid cartilage.  Dissection was  carried down quickly with cautery and retractors down to the cricoid and trachea.  Horizontal tracheotomy was performed just below the cricoid cartilage, and a #6 endotracheal tube was emergently inserted and the patient was ventilated.  This was  confirmed to be within the trachea.  From the point of starting the emergent trach from when his sats dropped was probably 4-5 minutes before we got the trach in.  The patient's sats returned back to normal.  Following this, the neck was further prepped  with Betadine, draped out in sterile towels, and the tracheal incision was explored for bleeding as he had minor bleeding.  This was controlled with cautery.  Dissection was carried down bluntly to the trachea.  It appears the tracheostomy was just below  the cricoid cartilage.  The tracheostomy was initially intubated with a #6 endotracheal tube.  This was removed, and a standard Shiley #6 tracheostomy was positioned.  The patient bled well.  Hemostasis was obtained with cautery, and the new #6  tracheostomy tube was secured to the neck with 2-0 silk sutures x4 and Velcro trach collar around the neck.  Following completion of this, direct laryngoscopy was performed again in order to obtain tissue biopsy.  The patient had minor bleeding within  the oropharynx from attempted traumatic intubation, but there was no large amount of bleeding.  Several tissue fragments were obtained from the base of tongue mass and sent to pathology.  On palpation of this, this occupied majority of the  base of  tongue.  There was minimal bleeding within the oropharynx upon completion of this.  A nasogastric tube was passed into the stomach with minimal blood within the stomach.  The patient was subsequently transferred to the ICU postoperatively for postop  care.   ROH D: 05/09/2020 3:37:34 pm T: 05/09/2020 10:11:00 pm  JOB:  [id number]/ [id number]

## 2020-05-09 NOTE — Consult Note (Signed)
NAME:  Alexander [name], MRN:  [id number], DOB:  04/09/1960, LOS: 0 ADMISSION DATE:  05/09/2020, CONSULTATION DATE:  05/09/2020 REFERRING MD:  Dr. [name], CHIEF COMPLAINT:  Emergent trach    History of Present Illness:  Alexander [name] is a 60 y.o. male with PMH significant for recent left neck and left tongue base mass seen for which ENT was consulted 05/07/2020 CT confirmed a large base of the tongue mass on the left side and enlarged left neck node consistent with possible carcinoma. Given concern for cancer, patient elective for biopsy with Dr. [name] today 3/23.   Per anesthesia initially the palm was to preform an awake intubation but decision was later made to precede with sedation for intubation. Per ENT, Dr, [name] patient was unable to be intubated and hypoxia ensued resulting in need for emergent tracheostomy. ENT reported 4-5 min of hypoxia during establishment of trach.   PCCM consulted for further management.     Pertinent  Medical History   Sleep apnea, [facility] Events: Including procedures, antibiotic start and stop dates in addition to other pertinent events    3/23 > Left tongue and neck mass prompting biopsy and need for emergent trach given inability to endotracheal intubate   Interim History / Subjective:  Seen sitting up in bed in PACU, drowsy but able to ask appropriate questions and follow simple commands   Objective   Blood pressure (!) 140/95, pulse 97, temperature 98.7 F (37.1 C), temperature source Oral, resp. rate 18, height 6\' 3"  (1.905 m), weight 132.9 kg, SpO2 98 %.        Intake/Output Summary (Last 24 hours) at 05/09/2020 1555 Last data filed at 05/09/2020 1540 Gross per 24 hour  Intake 300 ml  Output 150 ml  Net 150 ml   Filed Weights   05/08/20 1247 05/09/20 1104  Weight: 133.8 kg 132.9 kg    Examination: General: acutely ill appearing obese M, reclined in bed NAD HENT: Anicteric sclera. Trach with small amount of blood,  secure. Small amount of bloody oral secretions. Pink mmm Lungs: CTAb. Symmetrical chest expansion, no accessory use Cardiovascular: rrr s1s2 cap refill brisk Abdomen: Soft round ndnt +bowel sounds Extremities: No acute deformity, no cyanosis or clubbing  Neuro: Drowsy, PERRL. Moving spontaneously, following commands  GU: defer   Labs/imaging that I havepersonally reviewed   PTA 3/14 CT soft tissue neck -- large soft tissue mass at left tongue base  No current labs or images to view at time of assessment   Resolved Hospital Problem list   Periop bradycardia  Assessment & Plan:  60 yo s/p emergent trach in OR for tongue mass excision. Post op, ENT has asked PCCM to take over as primary for ongoing care.   Acute encephalopathy -suspect this is related to sedation for case. Was hypoxic spo2 50s x about 5 minutes-- consider this if doesn't improve after metabolism of meds P Follow neuro exam Minimize cns depressing meds as able   Acute respiratory failure with hypoxia requiring emergent tracheostomy -unable to intubate HX of Sleep apnea  P Monitor in ICU given fresh trach  Trach per ENT Monitor for post-op bleeding, routine trach care and pulm hygiene Trach collar as tolerated  PRN analgesia   Tongue mass s/p excision -base of tongue excised 3/23 P Follow up  Path -- Per ENT  Will need radiation Defer VTE ppx x at least 24hrs  Will check post op CBC (EBL approx 143ml, pre-op CBC WNL)  GERD P PPI   Best practice (evaluated daily)  Diet:  NPO Pain/Anxiety/Delirium protocol (if indicated): Yes (RASS goal 0) VAP protocol (if indicated): Not indicated DVT prophylaxis: SCD GI prophylaxis: N/A Glucose control:  Monitor Central venous access:  N/A Arterial line:  N/A Foley:  N/A Mobility:  bed rest  PT consulted: Yes Last date of multidisciplinary goals of care discussion: N/A Code Status:  full code Disposition: ICU  Labs   CBC: No results for input(s): WBC,  NEUTROABS, HGB, HCT, MCV, PLT in the last 168 hours.  Basic Metabolic Panel: No results for input(s): NA, K, CL, CO2, GLUCOSE, BUN, CREATININE, CALCIUM, MG, PHOS in the last 168 hours. GFR: Estimated Creatinine Clearance: 99.1 mL/min (by C-G formula based on SCr of 1.18 mg/dL). No results for input(s): PROCALCITON, WBC, LATICACIDVEN in the last 168 hours.  Liver Function Tests: No results for input(s): AST, ALT, ALKPHOS, BILITOT, PROT, ALBUMIN in the last 168 hours. No results for input(s): LIPASE, AMYLASE in the last 168 hours. No results for input(s): AMMONIA in the last 168 hours.  ABG No results found for: PHART, PCO2ART, PO2ART, HCO3, TCO2, ACIDBASEDEF, O2SAT   Coagulation Profile: No results for input(s): INR, PROTIME in the last 168 hours.  Cardiac Enzymes: No results for input(s): CKTOTAL, CKMB, CKMBINDEX, TROPONINI in the last 168 hours.  HbA1C: No results found for: HGBA1C  CBG: No results for input(s): GLUCAP in the last 168 hours.  Review of Systems:   Negative except stated in HPI  Past Medical History:  He,  has a past medical history of GERD (gastroesophageal reflux disease) and Sleep apnea.   Surgical History:   Past Surgical History:  Procedure Laterality Date  . TONSILLECTOMY       Social History:   reports that he has never smoked. He has never used smokeless tobacco. He reports previous alcohol use. He reports that he does not use drugs.   Family History:  His family history is not on file.   Allergies No Known Allergies   Home Medications  Prior to Admission medications   Medication Sig Start Date End Date Taking? Authorizing Provider  Ascorbic Acid (VITAMIN C PO) Take 1 tablet by mouth daily.   Yes [provider]  cetirizine (ZYRTEC) 10 MG tablet Take 10 mg by mouth daily.   Yes [provider]  fluticasone (FLONASE) 50 MCG/ACT nasal spray Place 1 spray into both nostrils daily.   Yes [provider]   Fluticasone-Salmeterol (ADVAIR) 100-50 MCG/DOSE AEPB Inhale 1 puff into the lungs daily.   Yes [provider]  Multiple Vitamin (MULTIVITAMIN ADULT PO) Take 1 tablet by mouth daily.   Yes [provider]  Multiple Vitamins-Minerals (ZINC PO) Take 1 tablet by mouth daily.   Yes [provider]  omeprazole (PRILOSEC) 40 MG capsule Take 40 mg by mouth daily.   Yes [provider]  predniSONE (DELTASONE) 10 MG tablet Take 2 tablets (20 mg total) by mouth daily. 04/30/20  Yes Pattricia Boss, MD  predniSONE (DELTASONE) 10 MG tablet Take 2 tablets (20 mg total) by mouth daily. 04/30/20  Yes Pattricia Boss, MD  albuterol (VENTOLIN HFA) 108 (90 Base) MCG/ACT inhaler Inhale 2 puffs into the lungs as needed for wheezing or shortness of breath.    [provider]     Signature:    Johnsie Cancel, NP-C  Pulmonary & Critical Care Personal contact information can be found on Amion  If no response please page: Adult pulmonary  and critical care medicine pager on Amion unitl 7pm After 7pm please call [phone] 05/09/2020, 4:29 PM

## 2020-05-09 NOTE — Transfer of Care (Signed)
Immediate Anesthesia Transfer of Care Note  Patient: Alexander [name]  Procedure(s) Performed: EXCISION TONGUE MASS (N/A ) DIRECT LARYNGOSCOPY (Throat) TRACHEOSTOMY (Neck)  Patient Location: PACU  Anesthesia Type:General  Level of Consciousness: awake, alert  and patient cooperative  Airway & Oxygen Therapy: Patient Spontanous Breathing  Post-op Assessment: Report given to RN and Post -op Vital signs reviewed and stable  Post vital signs: Reviewed and stable  Last Vitals:  Vitals Value Taken Time  BP    Temp    Pulse    Resp    SpO2      Last Pain:  Vitals:   05/09/20 1125  TempSrc:   PainSc: 4          Complications: No complications documented.

## 2020-05-09 NOTE — Interval H&P Note (Signed)
History and Physical Interval Note:  05/09/2020 12:18 PM  Alexander [name]  has presented today for surgery, with the diagnosis of TONGUE MASS.  The various methods of treatment have been discussed with the patient and family. After consideration of risks, benefits and other options for treatment, the patient has consented to  Procedure(s): EXCISION TONGUE MASS (N/A) as a surgical intervention.  The patient's history has been reviewed, patient examined, no change in status, stable for surgery.  I have reviewed the patient's chart and labs.  Questions were answered to the patient's satisfaction.     Melony Overly

## 2020-05-10 ENCOUNTER — Ambulatory Visit
Admit: 2020-05-10 | Discharge: 2020-05-10 | Disposition: A | Discharge status: Home / Self Care | Payer: BC Managed Care – PPO | Attending: Radiation Oncology | Admitting: Radiation Oncology

## 2020-05-10 ENCOUNTER — Inpatient Hospital Stay (HOSPITAL_COMMUNITY): Payer: BC Managed Care – PPO

## 2020-05-10 ENCOUNTER — Encounter (HOSPITAL_COMMUNITY): Payer: Self-pay | Admitting: Otolaryngology

## 2020-05-10 ENCOUNTER — Encounter (INDEPENDENT_AMBULATORY_CARE_PROVIDER_SITE_OTHER): Payer: Self-pay | Admitting: Otolaryngology

## 2020-05-10 DIAGNOSIS — C029 Malignant neoplasm of tongue, unspecified: Secondary | ICD-10-CM | POA: Diagnosis not present

## 2020-05-10 DIAGNOSIS — R221 Localized swelling, mass and lump, neck: Secondary | ICD-10-CM

## 2020-05-10 DIAGNOSIS — K148 Other diseases of tongue: Secondary | ICD-10-CM

## 2020-05-10 DIAGNOSIS — R1319 Other dysphagia: Secondary | ICD-10-CM

## 2020-05-10 DIAGNOSIS — K219 Gastro-esophageal reflux disease without esophagitis: Secondary | ICD-10-CM

## 2020-05-10 DIAGNOSIS — Z430 Encounter for attention to tracheostomy: Secondary | ICD-10-CM

## 2020-05-10 DIAGNOSIS — C109 Malignant neoplasm of oropharynx, unspecified: Secondary | ICD-10-CM

## 2020-05-10 DIAGNOSIS — R634 Abnormal weight loss: Secondary | ICD-10-CM

## 2020-05-10 DIAGNOSIS — G4733 Obstructive sleep apnea (adult) (pediatric): Secondary | ICD-10-CM

## 2020-05-10 DIAGNOSIS — Z930 Tracheostomy status: Secondary | ICD-10-CM

## 2020-05-10 DIAGNOSIS — Z79899 Other long term (current) drug therapy: Secondary | ICD-10-CM

## 2020-05-10 DIAGNOSIS — J9601 Acute respiratory failure with hypoxia: Secondary | ICD-10-CM | POA: Diagnosis not present

## 2020-05-10 LAB — CBC
HCT: 44.3 % (ref 39.0–52.0)
Hemoglobin: 15 g/dL (ref 13.0–17.0)
MCH: 30.7 pg (ref 26.0–34.0)
MCHC: 33.9 g/dL (ref 30.0–36.0)
MCV: 90.8 fL (ref 80.0–100.0)
Platelets: 218 10*3/uL (ref 150–400)
RBC: 4.88 MIL/uL (ref 4.22–5.81)
RDW: 14.3 % (ref 11.5–15.5)
WBC: 16.3 10*3/uL — ABNORMAL HIGH (ref 4.0–10.5)
nRBC: 0 % (ref 0.0–0.2)

## 2020-05-10 LAB — GLUCOSE, CAPILLARY
Glucose-Capillary: 117 mg/dL — ABNORMAL HIGH (ref 70–99)
Glucose-Capillary: 112 mg/dL — ABNORMAL HIGH (ref 70–99)
Glucose-Capillary: 100 mg/dL — ABNORMAL HIGH (ref 70–99)
Glucose-Capillary: 99 mg/dL (ref 70–99)
Glucose-Capillary: 101 mg/dL — ABNORMAL HIGH (ref 70–99)
Glucose-Capillary: 107 mg/dL — ABNORMAL HIGH (ref 70–99)

## 2020-05-10 LAB — BASIC METABOLIC PANEL
Anion gap: 8 (ref 5–15)
BUN: 10 mg/dL (ref 6–20)
CO2: 28 mmol/L (ref 22–32)
Calcium: 9.3 mg/dL (ref 8.9–10.3)
Chloride: 100 mmol/L (ref 98–111)
Creatinine, Ser: 1.07 mg/dL (ref 0.61–1.24)
GFR, Estimated: >60 mL/min (ref >60)
Glucose, Bld: 128 mg/dL — ABNORMAL HIGH (ref 70–99)
Potassium: 4.4 mmol/L (ref 3.5–5.1)
Sodium: 136 mmol/L (ref 135–145)

## 2020-05-10 LAB — MAGNESIUM: Magnesium: 2.2 mg/dL (ref 1.7–2.4)

## 2020-05-10 LAB — PHOSPHORUS: Phosphorus: 4.1 mg/dL (ref 2.5–4.6)

## 2020-05-10 MED ORDER — CHLORHEXIDINE GLUCONATE CLOTH 2 % EX PADS
6.0000 | MEDICATED_PAD | Freq: Every day | CUTANEOUS | Status: DC
  Administered 2020-05-10 – 2020-05-12 (×3): 6 via TOPICAL

## 2020-05-10 MED ORDER — PANTOPRAZOLE SODIUM 40 MG IV SOLR
40.0000 mg | INTRAVENOUS | Status: DC
  Administered 2020-05-10: 40 mg via INTRAVENOUS
  Filled 2020-05-10: qty 40

## 2020-05-10 NOTE — Evaluation (Signed)
Clinical/Bedside Swallow Evaluation Patient Details  Name: Alexander [name] MRN: [id number] Date of Birth: [birth year]  Today's Date: 05/10/2020 Time: SLP Start Time (ACUTE ONLY): 1028 SLP Stop Time (ACUTE ONLY): 1050 SLP Time Calculation (min) (ACUTE ONLY): 22 min  Past Medical History:  Past Medical History:  Diagnosis Date   GERD (gastroesophageal reflux disease)    Sleep apnea    Past Surgical History:  Past Surgical History:  Procedure Laterality Date   DIRECT LARYNGOSCOPY  05/09/2020   Procedure: DIRECT LARYNGOSCOPY;  Surgeon: Rozetta Nunnery, MD;  Location: [facility] OR;  Service: ENT;;   MASS EXCISION N/A 05/09/2020   Procedure: EXCISION TONGUE MASS;  Surgeon: Rozetta Nunnery, MD;  Location: [place];  Service: ENT;  Laterality: N/A;   TONSILLECTOMY     TRACHEOSTOMY TUBE PLACEMENT  05/09/2020   Procedure: TRACHEOSTOMY;  Surgeon: Rozetta Nunnery, MD;  Location: [place];  Service: ENT;;   HPI:  Pt is admitted following biopsy of a large base of tongue mass. Under fiberoptic laryngoscopy in the ENT office, pt "had a large base of tongue mass that pushed the epiglottis back a little bit posteriorly.  On exam of the vocal cords, vocal cords were clear with normal vocal cord mobility otherwise."  During biopsy procedure pt could not be intubated due to mass and body habitus obstructing the airway and emergent trach was placed.   Assessment / Plan / Recommendation Clinical Impression  Pt reports a mild dysphagia prior to surgery related to mass; pt says he swallowed "on the right" but also gestures that he tilted his head to the right. Reports he needed to follow bites with sips. Pt grimaces during swallowing, but shows no other signs of aspiration though his intake was limited. Pt fearful regarding swallowing asks "what keeps it from going down the wrong pipe?" SLP provided visual instruction on mechanism of airway protection with swallowing and a trach to reinforce minimal  change in pts swallowing ability after trach. Reinforced the benefit of PMSV with swallowing for effective coughing. Pt able to continue full liquids and should be able to upgrade when he is comfortable. Will continue to follow pt. Will need to consider appropriate f/u given probably future radiation therapy. SLP Visit Diagnosis: Dysphagia, oropharyngeal phase (R13.12)    Aspiration Risk  Mild aspiration risk    Diet Recommendation Thin liquid;Regular   Liquid Administration via: Cup;Straw Medication Administration: Crushed with puree Supervision: Patient able to self feed Compensations: Slow rate;Small sips/bites;Follow solids with liquid Postural Changes: Seated upright at 90 degrees    Other  Recommendations Oral Care Recommendations: Oral care BID   Follow up Recommendations Home health SLP      Frequency and Duration min 2x/week  2 weeks       Prognosis        Swallow Study   General HPI: Pt is admitted following biopsy of a large base of tongue mass. Under fiberoptic laryngoscopy in the ENT office, pt "had a large base of tongue mass that pushed the epiglottis back a little bit posteriorly.  On exam of the vocal cords, vocal cords were clear with normal vocal cord mobility otherwise."  During biopsy procedure pt could not be intubated due to mass and body habitus obstructing the airway and emergent trach was placed. Type of Study: Bedside Swallow Evaluation Diet Prior to this Study: Thin liquids Respiratory Status: Trach Collar History of Recent Intubation: No Behavior/Cognition: Alert;Cooperative Oral Cavity Assessment: Within Functional Limits Oral Care Completed by SLP: No  Oral Cavity - Dentition: Adequate natural dentition Self-Feeding Abilities: Able to feed self Patient Positioning: Upright in bed Baseline Vocal Quality: Hoarse Volitional Cough: Strong Volitional Swallow: Able to elicit    Oral/Motor/Sensory Function Overall Oral Motor/Sensory Function: Within  functional limits   Ice Chips     Thin Liquid Thin Liquid: Within functional limits Presentation: Straw    Nectar Thick Nectar Thick Liquid: Not tested   Honey Thick Honey Thick Liquid: Not tested   Puree Puree: Within functional limits   Solid     Solid: Not tested     Herbie Baltimore, MA [facility] Pager [phone] Office [phone]  Lynann Beaver 05/10/2020,2:03 PM

## 2020-05-10 NOTE — Consult Note (Addendum)
[facility]  [phone] [phone]   MEDICAL ONCOLOGY - INITIAL CONSULTATION  Referral MD: Dr. Jacky Kindle  Reason for Referral: Tongue mass  HPI: Alexander [name] is a 60 year old male with a past medical history significant for asthma, GERD, and obstructive sleep apnea.  The patient presented to the emergency room on 3/14 for a neck mass and neck swelling.  Symptoms have been present for several months.  He had a CT of the neck performed which showed a large soft tissue mass centered in the region of the left tongue base also extending posteriorly to the glossotonsillar sulcus and vallecula.  The portion of the mass has poorly defined margins but appears to cross midline and is estimated to measure at least 5.0 x 4.7 x 3.4 cm, the epiglottis and aryepiglottic folds are not well separately delineated may involve the mass, suspect tumor extension inferiorly at least to the level of the supraglottic larynx, also apparent tumor extension on the right into the strap muscles at the level of supraglottic larynx. Outpatient ENT referral was made.  The patient was set up for laryngoscopy and biopsy but required emergent tracheostomy on 05/07/2020.  He was transferred to the ICU postop.  I met with the patient in his hospital room.  His daughter joined [place] at the bedside.  The patient reports that he has been having neck swelling for the past 4 months which has increased in size.  He also reported having some headaches at night which seem to have now resolved.  He was having difficulty eating and swallowing due to the mass on his tongue.  He has lost approximately 30 pounds.  He is not having any discomfort to his trach site.  He denies fevers, chills, chest pain, shortness of breath, abdominal pain, nausea, vomiting.  No bleeding reported.  The patient is divorced.  He has 4 children.  Drinks alcohol rarely and denies history of tobacco use.  Family history significant for a  brother and a father with prostate cancer and a mother with breast and cervical cancer.  Medical oncology was asked see the patient made recommendations regarding his tongue mass.   Past Medical History:  Diagnosis Date  . GERD (gastroesophageal reflux disease)   . Sleep apnea   :  Past Surgical History:  Procedure Laterality Date  . DIRECT LARYNGOSCOPY  05/09/2020   Procedure: DIRECT LARYNGOSCOPY;  Surgeon: Rozetta Nunnery, MD;  Location: [place];  Service: ENT;;  . MASS EXCISION N/A 05/09/2020   Procedure: EXCISION TONGUE MASS;  Surgeon: Rozetta Nunnery, MD;  Location: [place];  Service: ENT;  Laterality: N/A;  . TONSILLECTOMY    . TRACHEOSTOMY TUBE PLACEMENT  05/09/2020   Procedure: TRACHEOSTOMY;  Surgeon: Rozetta Nunnery, MD;  Location: [place];  Service: ENT;;  :  Current Facility-Administered Medications  Medication Dose Route Frequency Provider Last Rate Last Admin  . albuterol (VENTOLIN HFA) 108 (90 Base) MCG/ACT inhaler 2 puff  2 puff Inhalation PRN Rozetta Nunnery, MD      . bacitracin ointment 1 application  1 application Topical P8E Rozetta Nunnery, MD   1 application at 42/35/36 0645  . Chlorhexidine Gluconate Cloth 2 % PADS 6 each  6 each Topical Daily Candee Furbish, MD      . fentaNYL (SUBLIMAZE) injection 50 mcg  50 mcg Intravenous Q1H PRN Candee Furbish, MD      . LORazepam (ATIVAN) injection 1 mg  1 mg Intravenous QHS  PRN Candee Furbish, MD      . ondansetron [facility]) tablet 4 mg  4 mg Oral Q4H PRN Rozetta Nunnery, MD       Or  . ondansetron [facility]) injection 4 mg  4 mg Intravenous Q4H PRN Rozetta Nunnery, MD      . pantoprazole (PROTONIX) injection 40 mg  40 mg Intravenous Q24H Jacky Kindle, MD      . tranexamic acid (CYKLOKAPRON) 1000 MG/10ML nebulizer solution 500 mg  500 mg Nebulization Q12H PRN Candee Furbish, MD         No Known Allergies:  History reviewed. No pertinent family history.:  Social History    Socioeconomic History  . Marital status: Single    Spouse name: Not on file  . Number of children: Not on file  . Years of education: Not on file  . Highest education level: Not on file  Occupational History  . Not on file  Tobacco Use  . Smoking status: Never Smoker  . Smokeless tobacco: Never Used  Vaping Use  . Vaping Use: Never used  Substance and Sexual Activity  . Alcohol use: Not Currently    Comment: "4-5 drinks a year for special ocassions" 05/08/20  . Drug use: Never  . Sexual activity: Not on file  Other Topics Concern  . Not on file  Social History Narrative  . Not on file   Social Determinants of Health   Financial Resource Strain: Not on file  Food Insecurity: Not on file  Transportation Needs: Not on file  Physical Activity: Not on file  Stress: Not on file  Social Connections: Not on file  Intimate Partner Violence: Not on file  :  Review of Systems: A comprehensive 14 point review of systems was negative except as noted in the HPI.  Exam: Patient Vitals for the past 24 hrs:  BP Temp Temp src Pulse Resp SpO2  05/10/20 1400 119/86 - - 78 17 95 %  05/10/20 1300 132/78 - - (!) 55 16 94 %  05/10/20 1200 133/81 - - 68 18 94 %  05/10/20 1130 - 98.4 F (36.9 C) Oral - - -  05/10/20 1114 133/85 - - 77 15 96 %  05/10/20 1100 133/85 - - 61 15 95 %  05/10/20 1000 (!) 119/101 - - 83 (!) 24 96 %  05/10/20 0900 - - - 66 17 95 %  05/10/20 0800 131/77 - - 63 20 95 %  05/10/20 0742 - 98.3 F (36.8 C) Oral - - -  05/10/20 0715 127/82 - - 64 14 97 %  05/10/20 0700 127/82 - - (!) 57 18 96 %  05/10/20 0600 129/80 - - 88 (!) 28 96 %  05/10/20 0500 128/80 - - (!) 57 19 97 %  05/10/20 0400 135/79 - - 68 17 96 %  05/10/20 0333 - 98.1 F (36.7 C) Oral - - -  05/10/20 0324 - - - (!) 57 17 97 %  05/10/20 0300 131/80 - - (!) 53 18 96 %  05/10/20 0200 137/81 - - (!) 54 17 98 %  05/10/20 0100 135/75 - - (!) 55 18 97 %  05/10/20 0034 - - - 68 18 97 %  05/10/20 0000  139/87 - - 73 17 96 %  05/09/20 2336 - 97.9 F (36.6 C) Oral - - -  05/09/20 2300 (!) 147/86 - - 63 20 96 %  05/09/20 2200 (!) 144/81 - - 71 19 94 %  05/09/20 2100 - - - (!) 52 17 96 %  05/09/20 2000 135/76 - - 73 14 97 %  05/09/20 1935 - 98 F (36.7 C) Oral - - -  05/09/20 1800 132/87 - - 92 16 97 %  05/09/20 1723 - 97.6 F (36.4 C) Oral - - -  05/09/20 1645 136/84 (!) 97.2 F (36.2 C) - 83 15 94 %  05/09/20 1630 131/85 - - 89 16 93 %  05/09/20 1615 118/76 - - 85 18 93 %  05/09/20 1545 (!) 142/82 (!) 97.1 F (36.2 C) - 86 17 95 %    General: Awake and alert, no distress Eyes:  no scleral icterus.   ENT: No thrush or mucositis. Neck: Tracheostomy midline. Lymphatics: Palpable left cervical adenopathy. Respiratory: lungs were clear bilaterally without wheezing or crackles.   Cardiovascular:  Regular rate and rhythm, S1/S2, without murmur, rub or gallop.  There was no pedal edema.   GI:  abdomen was soft, flat, nontender, nondistended, without organomegaly.   Musculoskeletal: Strength symmetrical in the upper and lower extremities. Skin exam was without echymosis, petichae.   Neuro exam was nonfocal. Patient was alert and oriented.  Attention was good.   Communicates through writing.   Lab Results  Component Value Date   WBC 16.3 (H) 05/10/2020   HGB 15.0 05/10/2020   HCT 44.3 05/10/2020   PLT 218 05/10/2020   GLUCOSE 128 (H) 05/10/2020   ALT 24 04/30/2020   AST 23 04/30/2020   NA 136 05/10/2020   K 4.4 05/10/2020   CL 100 05/10/2020   CREATININE 1.07 05/10/2020   BUN 10 05/10/2020   CO2 28 05/10/2020    CT Soft Tissue Neck W Contrast  Result Date: 04/30/2020 CLINICAL DATA:  Soft tissue mass, neck, no prior imaging. EXAM: CT NECK WITH CONTRAST TECHNIQUE: Multidetector CT imaging of the neck was performed using the standard protocol following the bolus administration of intravenous contrast. CONTRAST:  47mL OMNIPAQUE IOHEXOL 300 MG/ML  SOLN COMPARISON:  No pertinent  prior exams available for comparison. FINDINGS: Pharynx and larynx: There is a large soft tissue mass centered in the region of the left tongue base which also extends posteriorly to the glossotonsillar sulcus and vallecula. The mass has poorly defined margins appears to cross midline and is estimated to measure at least 5.0 x 4.7 x 3.4 cm. The epiglottis and aryepiglottic folds are not well separately delineated and may be involved by the mass. Suspect tumor extension inferiorly at least to the level of the supraglottic larynx. Apparent tumor extension on the right into the strap muscles at the level of the supraglottic larynx (series 3, image 89). Infiltrated appearance of the pre epiglottic fat. There is a more subtly infiltrated appearance of the paraglottic fat. Additionally, there is asymmetric prominence of the left piriformis sinus suspicious for left vocal cord palsy. There is moderate/severe effacement of the oropharyngeal airway. Nonspecific edema within the floor of mouth/submandibular space. Salivary glands: No inflammation, mass, or stone. Thyroid: Unremarkable. Lymph nodes: Extensive bilateral cervical lymphadenopathy compatible with nodal metastatic disease. Some of the lymph nodes demonstrate abnormal internal low-density. Most notably, there is a bulky node/nodal conglomerate at the left level 2/3 stations measuring 6.2 x 3.4 cm (series 5, image 81). This lymph node has poorly defined margins concerning for extracapsular extension. Additional lymphadenopathy at the left level 2, 3 and 5 stations. An enlarged right level 2 lymph node measures 4.9 x 2.5 cm in transaxial dimensions. Additional lymphadenopathy at the right  2 and 3 stations. For instance, an enlarged right level 3 lymph node measures 1.7 cm in short axis. Vascular: The major vascular structures of the neck are patent. The mid left internal jugular vein is markedly narrowed by left level 2/3 lymphadenopathy. Limited intracranial: No  acute intracranial abnormality is identified. Visualized orbits: No mass or acute finding. Mastoids and visualized paranasal sinuses: Mild bilateral frontal sinus mucosal thickening. Extensive partial opacification of the bilateral ethmoid air cells. Mild right maxillary sinus mucosal thickening. Moderate left maxillary sinus mucosal thickening greatest inferiorly. Skeleton: Mild age-indeterminate C4 superior endplate vertebral compression deformity. Elsewhere, there is no acute bony abnormality or aggressive osseous lesion. Upper chest: No consolidation within the imaged lung apices. These results were called by telephone at the time of interpretation on 04/30/2020 at 2:38 pm to provider [facility] RAY , who verbally acknowledged these results. IMPRESSION: Large soft tissue mass centered in the region of the left tongue base also extending posteriorly to the glossotonsillar sulcus and vallecula. This portion of the mass has poorly defined margins but appears to cross midline and is estimated to measure at least 5.0 x 4.7 x 3.4 cm. The epiglottis and aryepiglottic folds are not well separately delineated and may be involved by the mass. Suspect tumor extension inferiorly at least to the level of the supraglottic larynx. There is also apparent tumor extension on the right into the strap muscles at the level of the supraglottic larynx. Also of note, there is an infiltrated appearance of the pre-epiglottic fat and a more subtly infiltrated appearance of the paraglottic fat concerning for tumor infiltration. Furthermore, there is asymmetric prominence of the left piriformis sinus suspicious for left vocal cord palsy. ENT consultation is recommended. Results in moderate/severe effacement of the oropharyngeal airway. Nonspecific edema within the floor of mouth/submandibular space. 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 There is concern for extracapsular extension, as described.  The mid left internal jugular vein is markedly effaced by lymphadenopathy. Paranasal sinus disease as described. Mild age-indeterminate T4 superior endplate vertebral compression deformity. Electronically Signed   By: Jackey Loge DO   On: 04/30/2020 14:38   DG CHEST PORT 1 VIEW  Result Date: 05/10/2020 CLINICAL DATA:  Tracheostomy. EXAM: PORTABLE CHEST 1 VIEW COMPARISON:  No prior. FINDINGS: Tracheostomy noted in stable position. Heart size normal. Low lung volumes with mild bibasilar atelectasis. Questionable tiny nodule left upper lung. PA lateral chest x-ray suggested for further evaluation. Tiny bilateral pleural effusions cannot be excluded. No pneumothorax. IMPRESSION: 1. Tracheostomy tube in stable position. 2. Low lung volumes with mild bibasilar atelectasis. Tiny bilateral pleural effusions cannot be excluded. 3. Questionable tiny pulmonary nodule left upper lung. PA lateral chest x-ray suggested for further evaluation. Electronically Signed   By: Maisie Fus  Register   On: 05/10/2020 08:16     CT Soft Tissue Neck W Contrast  Result Date: 04/30/2020 CLINICAL DATA:  Soft tissue mass, neck, no prior imaging. EXAM: CT NECK WITH CONTRAST TECHNIQUE: Multidetector CT imaging of the neck was performed using the standard protocol following the bolus administration of intravenous contrast. CONTRAST:  41mL OMNIPAQUE IOHEXOL 300 MG/ML  SOLN COMPARISON:  No pertinent prior exams available for comparison. FINDINGS: Pharynx and larynx: There is a large soft tissue mass centered in the region of the left tongue base which also extends posteriorly to the glossotonsillar sulcus and vallecula. The mass has poorly defined margins appears to cross midline and is estimated to measure at least 5.0 x 4.7 x 3.4 cm.  The epiglottis and aryepiglottic folds are not well separately delineated and may be involved by the mass. Suspect tumor extension inferiorly at least to the level of the supraglottic larynx. Apparent tumor  extension on the right into the strap muscles at the level of the supraglottic larynx (series 3, image 89). Infiltrated appearance of the pre epiglottic fat. There is a more subtly infiltrated appearance of the paraglottic fat. Additionally, there is asymmetric prominence of the left piriformis sinus suspicious for left vocal cord palsy. There is moderate/severe effacement of the oropharyngeal airway. Nonspecific edema within the floor of mouth/submandibular space. Salivary glands: No inflammation, mass, or stone. Thyroid: Unremarkable. Lymph nodes: Extensive bilateral cervical lymphadenopathy compatible with nodal metastatic disease. Some of the lymph nodes demonstrate abnormal internal low-density. Most notably, there is a bulky node/nodal conglomerate at the left level 2/3 stations measuring 6.2 x 3.4 cm (series 5, image 81). This lymph node has poorly defined margins concerning for extracapsular extension. Additional lymphadenopathy at the left level 2, 3 and 5 stations. An enlarged right level 2 lymph node measures 4.9 x 2.5 cm in transaxial dimensions. Additional lymphadenopathy at the right 2 and 3 stations. For instance, an enlarged right level 3 lymph node measures 1.7 cm in short axis. Vascular: The major vascular structures of the neck are patent. The mid left internal jugular vein is markedly narrowed by left level 2/3 lymphadenopathy. Limited intracranial: No acute intracranial abnormality is identified. Visualized orbits: No mass or acute finding. Mastoids and visualized paranasal sinuses: Mild bilateral frontal sinus mucosal thickening. Extensive partial opacification of the bilateral ethmoid air cells. Mild right maxillary sinus mucosal thickening. Moderate left maxillary sinus mucosal thickening greatest inferiorly. Skeleton: Mild age-indeterminate C4 superior endplate vertebral compression deformity. Elsewhere, there is no acute bony abnormality or aggressive osseous lesion. Upper chest: No  consolidation within the imaged lung apices. These results were called by telephone at the time of interpretation on 04/30/2020 at 2:38 pm to provider [facility] RAY , who verbally acknowledged these results. IMPRESSION: Large soft tissue mass centered in the region of the left tongue base also extending posteriorly to the glossotonsillar sulcus and vallecula. This portion of the mass has poorly defined margins but appears to cross midline and is estimated to measure at least 5.0 x 4.7 x 3.4 cm. The epiglottis and aryepiglottic folds are not well separately delineated and may be involved by the mass. Suspect tumor extension inferiorly at least to the level of the supraglottic larynx. There is also apparent tumor extension on the right into the strap muscles at the level of the supraglottic larynx. Also of note, there is an infiltrated appearance of the pre-epiglottic fat and a more subtly infiltrated appearance of the paraglottic fat concerning for tumor infiltration. Furthermore, there is asymmetric prominence of the left piriformis sinus suspicious for left vocal cord palsy. ENT consultation is recommended. Results in moderate/severe effacement of the oropharyngeal airway. Nonspecific edema within the floor of mouth/submandibular space. 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 There is concern for extracapsular extension, as described. The mid left internal jugular vein is markedly effaced by lymphadenopathy. Paranasal sinus disease as described. Mild age-indeterminate T4 superior endplate vertebral compression deformity. Electronically Signed   By: Kellie Simmering DO   On: 04/30/2020 14:38   DG CHEST PORT 1 VIEW  Result Date: 05/10/2020 CLINICAL DATA:  Tracheostomy. EXAM: PORTABLE CHEST 1 VIEW COMPARISON:  No prior. FINDINGS: Tracheostomy noted in stable position. Heart size normal. Low lung volumes with mild bibasilar  atelectasis. Questionable tiny nodule left upper  lung. PA lateral chest x-ray suggested for further evaluation. Tiny bilateral pleural effusions cannot be excluded. No pneumothorax. IMPRESSION: 1. Tracheostomy tube in stable position. 2. Low lung volumes with mild bibasilar atelectasis. Tiny bilateral pleural effusions cannot be excluded. 3. Questionable tiny pulmonary nodule left upper lung. PA lateral chest x-ray suggested for further evaluation. Electronically Signed   By: Dustin Acres   On: 05/10/2020 08:16   Assessment and Plan:  1.  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  Acute hypoxic respiratory failure status post emergent tracheostomy 3.  Asthma 4.  GERD 5.  Obstructive sleep apnea  -Imaging findings concerning for malignancy of the tongue.  Biopsy is currently pending.  Discussed with the patient and his daughter that we will await the biopsy results to confirm malignancy.  Further recommendations regarding treatment pending biopsy results. -The patient is already scheduled for a PET scan as an outpatient on 05/15/2020.  Recommend for him to keep this appointment.  Will defer to Dr. Marin Olp whether we need to obtain additional scans while in the hospital such as CT scans or MRI brain. -Radiation oncology consult pending.  Thank you for this referral.   Alexander Bussing, DNP, AGPCNP-BC, AOCNP  ADDENDUM: I saw and examined Alexander [name].  He is very nice.  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  He has never smoked or drank.  He clearly has extensive cervical disease.  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  The p16 test will be pending.  I would think a CT scan of the chest would not be a bad idea.  He clearly will need chemotherapy.  I would think induction chemotherapy would be a reasonable way to go given the extent of his cervical disease.  He has a tracheotomy and right now.  I talked him about his situation.  The E83 test will certainly be  helpful.  He really is a very healthy individual.  It is unusual for [place] to see patients with head neck cancer being such good shape.  I would have to think that this tumor is somehow related to him being a first responder on 9/11.  Hopefully, he does not have metastatic disease to his chest.  I think he is due for a PET scan next week.  I think that he will need to have a Port-A-Cath placed.  Whether or not he needs a feeding tube I think will really come down to the stage of his disease.  If he has locally advanced disease in his neck, clearly a feeding tube will be needed given that he will have radiation treatments.  Again, I do think that induction chemotherapy is probably going to be the way to initially treat him.  He is certainly able to take aggressive therapy.  I thanked him for his dedication is for sacrifice as a first responder.  I would love to be able to talk to him and he be able to talk back 1 day.  Lattie Haw, MD  [place] 14:14

## 2020-05-10 NOTE — Progress Notes (Signed)
NAME:  Alexander [name], MRN:  [id number], DOB:  02-02-1961, LOS: 1 ADMISSION DATE:  05/09/2020, CONSULTATION DATE:  05/09/2020 REFERRING MD:  Dr. [name] CHIEF COMPLAINT:  Emergent trach  History of Present Illness:  Alexander [name] is a 60 y.o. male with PMH significant for recent left neck and left tongue base mass seen for which ENT was consulted 05/07/2020 CT confirmed a large base of the tongue mass on the left side and enlarged left neck node consistent with possible carcinoma. Given concern for cancer, patient elective for biopsy with Dr. [name] today 3/23.   Per anesthesia initially the palm was to preform an awake intubation but decision was later made to precede with sedation for intubation. Per ENT, Dr, [name] patient was unable to be intubated and hypoxia ensued resulting in need for emergent tracheostomy. ENT reported 4-5 min of hypoxia during establishment of trach.   PCCM consulted for further management.    Pertinent  Medical History  Nasal polyps, GERD, OSA  Significant Hospital Events: Including procedures, antibiotic start and stop dates in addition to other pertinent events    3/23 > Left tongue and neck mass prompting biopsy and need for emergent trach given inability to endotracheal intubate.  CXR: Questionable tiny pulmonary nodule on L upper lobe of lung  Surg path 3/23 >>  Interim History / Subjective:  Patient laying comfortable in bed on trach collar Follows commands, ask questions using pen and paper.  Update: Now able to speak through speaking valve  Objective   Blood pressure (!) 119/101, pulse 83, temperature 98.3 F (36.8 C), temperature source Oral, resp. rate (!) 24, height 6\' 3"  (1.905 m), weight 132.9 kg, SpO2 96 %.    FiO2 (%):  [28 %] 28 %   Intake/Output Summary (Last 24 hours) at 05/10/2020 1013 Last data filed at 05/10/2020 0800 Gross per 24 hour  Intake 400 ml  Output 850 ml  Net -450 ml   Filed Weights   05/08/20 1247 05/09/20 1104   Weight: 133.8 kg 132.9 kg    Examination: General: Obese, pleasant man laying in bed with trach collar on. NAD HENT: Large neck. Trach in place w/ small bloody secretions Lungs: CTAB. No wheezing or rales Cardiovascular: RRR. No m.r.g. No LE edema Abdomen: Soft. NT/ND. Normal BS. Extremities: Well-perfused Neuro: Alter and oriented. Moves all extremities spontaneously. Respond to questions by using head movements. Ask questions by writing on paper.   Labs/imaging that I havepersonally reviewed   White count 16.3, hemoglobin, BMP, Mag and all within normal limits CXR: Questionable tiny pulmonary nodule on L upper lobe of lung  Resolved Hospital Problem list   Acute encephalopathy  Assessment & Plan:  Acute Hypoxic respiratory failure s/p emergent trach Patient w/ hx of OSA now with tracheostomy due to inability to be intubated for surgery. Patient now on trach collar with appropriate O2 sat. Mild oozing of blood around trach but trach in stable position. Hemodynically stable and mentation back to baseline.  P: --Continue trach collar, on room air with O2 sats above 92% --Speaking valve in place --Routine trach care --PRN fentanyl and ativan --PRN albuterol  Tongue mass s/p excisional biopsy Post-op day 1. Biopsy of base of tongue mass sent to lab. Small oozing of blood around trach so will hold off on anticoagulation until at least 24 hr from surgery. Mild leukocytosis 2/2 stress but normal hemoglobin and electrolytes. P: --ENT following, recommending radiation --Consulting medical oncology and radiation --Surg path pending --Swallow study --Morning CBC,  BMP, Mag, phos --Scheduled for PET scan on 3/29  GERD --IV Protonix 40 mg q24hr  OSA --Will need CPAP at home  Best practice (evaluated daily)  Diet:  NPO Pain/Anxiety/Delirium protocol (if indicated): Yes, Ativan, fentanyl VAP protocol (if indicated): Not indicated DVT prophylaxis: SCD GI prophylaxis: PPI Glucose  control: N/A Central venous access:  N/A Arterial line:  N/A Foley:  N/A Mobility:  bed rest  PT consulted: N/A Last date of multidisciplinary goals of care discussion 3/24 with daughter Tandy Gaw Code Status:  full code Disposition: Transferring to floor, progressive  Labs   CBC: Recent Labs  Lab 05/10/20 0210  WBC 16.3*  HGB 15.0  HCT 44.3  MCV 90.8  PLT 981    Basic Metabolic Panel: Recent Labs  Lab 05/10/20 0210  NA 136  K 4.4  CL 100  CO2 28  GLUCOSE 128*  BUN 10  CREATININE 1.07  CALCIUM 9.3  MG 2.2  PHOS 4.1   GFR: Estimated Creatinine Clearance: 109.2 mL/min (by C-G formula based on SCr of 1.07 mg/dL). Recent Labs  Lab 05/10/20 0210  WBC 16.3*    Liver Function Tests: No results for input(s): AST, ALT, ALKPHOS, BILITOT, PROT, ALBUMIN in the last 168 hours. No results for input(s): LIPASE, AMYLASE in the last 168 hours. No results for input(s): AMMONIA in the last 168 hours.  ABG No results found for: PHART, PCO2ART, PO2ART, HCO3, TCO2, ACIDBASEDEF, O2SAT   Coagulation Profile: No results for input(s): INR, PROTIME in the last 168 hours.  Cardiac Enzymes: No results for input(s): CKTOTAL, CKMB, CKMBINDEX, TROPONINI in the last 168 hours.  HbA1C: No results found for: HGBA1C  CBG: Recent Labs  Lab 05/09/20 1720 05/09/20 1901 05/09/20 2304 05/10/20 0308 05/10/20 0705  GLUCAP 131* 136* 143* 117* 112*    Past Medical History:  He,  has a past medical history of GERD (gastroesophageal reflux disease) and Sleep apnea.   Surgical History:   Past Surgical History:  Procedure Laterality Date  . DIRECT LARYNGOSCOPY  05/09/2020   Procedure: DIRECT LARYNGOSCOPY;  Surgeon: Rozetta Nunnery, MD;  Location: [place];  Service: ENT;;  . MASS EXCISION N/A 05/09/2020   Procedure: EXCISION TONGUE MASS;  Surgeon: Rozetta Nunnery, MD;  Location: [place];  Service: ENT;  Laterality: N/A;  . TONSILLECTOMY    . TRACHEOSTOMY TUBE PLACEMENT   05/09/2020   Procedure: TRACHEOSTOMY;  Surgeon: Rozetta Nunnery, MD;  Location: [place];  Service: ENT;;     Social History:   reports that he has never smoked. He has never used smokeless tobacco. He reports previous alcohol use. He reports that he does not use drugs.   Family History:  His family history is not on file.   Allergies No Known Allergies   Home Medications  Prior to Admission medications   Medication Sig Start Date End Date Taking? Authorizing Provider  Ascorbic Acid (VITAMIN C PO) Take 1 tablet by mouth daily.   Yes [provider]  cetirizine (ZYRTEC) 10 MG tablet Take 10 mg by mouth daily.   Yes [provider]  fluticasone (FLONASE) 50 MCG/ACT nasal spray Place 1 spray into both nostrils daily.   Yes [provider]  Fluticasone-Salmeterol (ADVAIR) 100-50 MCG/DOSE AEPB Inhale 1 puff into the lungs daily.   Yes [provider]  Multiple Vitamin (MULTIVITAMIN ADULT PO) Take 1 tablet by mouth daily.   Yes [provider]  Multiple Vitamins-Minerals (ZINC PO) Take 1 tablet by mouth daily.  Yes [provider]  omeprazole (PRILOSEC) 40 MG capsule Take 40 mg by mouth daily.   Yes [provider]  predniSONE (DELTASONE) 10 MG tablet Take 2 tablets (20 mg total) by mouth daily. 04/30/20  Yes Pattricia Boss, MD  predniSONE (DELTASONE) 10 MG tablet Take 2 tablets (20 mg total) by mouth daily. 04/30/20  Yes Pattricia Boss, MD  albuterol (VENTOLIN HFA) 108 (90 Base) MCG/ACT inhaler Inhale 2 puffs into the lungs as needed for wheezing or shortness of breath.    [provider]     Critical care time: 61

## 2020-05-10 NOTE — Progress Notes (Signed)
POD 1 Patient doing reasonably well following emergent tracheostomy performed yesterday. He is able to drink liquids. He is having no airway problems and minimal bleeding from the tracheostomy site. Reviewed with the patient as well as his daughters concerning the final path report of the base of tongue biopsy showed invasive squamous cell carcinoma. Radiation oncology has already been consulted as has medical oncology.  Most likely will require combined therapy to treat this. Presently patient is scheduled for a PET scan for next Tuesday and hopefully he can be discharged prior to that time to have this performed as an outpatient. Discussed with the patient as well as his daughters concerning follow-up in my office in 12 to 16 days for recheck of the tracheostomy.

## 2020-05-10 NOTE — Progress Notes (Signed)
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 Tracheostomy care ([place])  Z43.0 DG CHEST PORT 1 VIEW    DG CHEST PORT 1 VIEW    HISTORY OF PRESENT ILLNESS::Alexander [name] is a 60 y.o. 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  He is registered with [employer] as a first responder due to exposure at the [facility] site.  He relocated to [facility] in the past year.  He noted gradually enlarging left neck mass with some voice changes.  He had some challenges transferring to new physicians due to [place] and he presented to the ED on 04/30/20 with an enlarging neck mass.  CT neck showed a 5 cm soft tissue mass centered in the region of the left tongue base also extending posteriorly to the glossotonsillar sulcus and vallecula without clear separation from the epiglottis:     In addition, bilateral cervical lymphadenopathy was seen measuring up to 6.2 cm on the left:    Subsequently, he was referred for ENT consultation with Dr. Radene Journey on 05/03/20.  In the office, fiberoptic laryngoscopy showed a left base of tongue mass pushing the epiglottis back with possible ulceration.  Given suspicion for a primary cancer, the patient was set up for PET-CT at [facility] on 05/15/20 and diagnostic laryngoscopy with biopsy.  The laryngoscopy procedure was performed yesterday and intraoperatively, and emergent tracheostomy was required due to tumor obscuring and affecting the airway.  Palpation of the tongue intraoperatively suggested diffuse infiltration of the tongue base.  Biopsies were obtained, revealing squamous  cell carcinoma.  Additional pathology features pending.  The patient is recovering from the procedure in the medical ICU with stable airway.  We have been asked to evaluate the patient from the perspective of potential radiation therapy.  PREVIOUS RADIATION THERAPY: No  Past Medical History:  Diagnosis Date  . GERD (gastroesophageal reflux disease)   . Sleep apnea   :  Past Surgical History:  Procedure Laterality Date  . DIRECT LARYNGOSCOPY  05/09/2020   Procedure: DIRECT LARYNGOSCOPY;  Surgeon: Rozetta Nunnery, MD;  Location: [place];  Service: ENT;;  . MASS EXCISION N/A 05/09/2020   Procedure: EXCISION TONGUE MASS;  Surgeon: Rozetta Nunnery, MD;  Location: [place];  Service: ENT;  Laterality: N/A;  . TONSILLECTOMY    . TRACHEOSTOMY TUBE PLACEMENT  05/09/2020   Procedure: TRACHEOSTOMY;  Surgeon: Rozetta Nunnery, MD;  Location: [place];  Service: ENT;;  :   Current Facility-Administered Medications:  .  albuterol (VENTOLIN HFA) 108 (90 Base) MCG/ACT inhaler 2 puff, 2 puff, Inhalation, PRN, Rozetta Nunnery, MD .  bacitracin ointment 1 application, 1 application, Topical, Z3A, Rozetta Nunnery, MD, 1 application at 07/62/26 0645 .  Chlorhexidine Gluconate Cloth 2 % PADS 6 each, 6 each, Topical, Daily, Candee Furbish, MD .  fentaNYL (SUBLIMAZE) injection 50 mcg, 50 mcg, Intravenous, Q1H PRN, Candee Furbish, MD .  LORazepam (ATIVAN) injection 1 mg, 1 mg, Intravenous, QHS PRN, Candee Furbish, MD .  ondansetron [facility]) tablet 4 mg, 4 mg, Oral, Q4H PRN **OR** ondansetron (ZOFRAN) injection  4 mg, 4 mg, Intravenous, Q4H PRN, Rozetta Nunnery, MD .  pantoprazole (PROTONIX) injection 40 mg, 40 mg, Intravenous, Q24H, Chand, Sudham, MD .  tranexamic acid (CYKLOKAPRON) 1000 MG/10ML nebulizer solution 500 mg, 500 mg, Nebulization, Q12H PRN, Candee Furbish, MD:  No Known Allergies:  History reviewed. No pertinent family history.:  Social History   Socioeconomic  History  . Marital status: Single    Spouse name: Not on file  . Number of children: Not on file  . Years of education: Not on file  . Highest education level: Not on file  Occupational History  . Not on file  Tobacco Use  . Smoking status: Never Smoker  . Smokeless tobacco: Never Used  Vaping Use  . Vaping Use: Never used  Substance and Sexual Activity  . Alcohol use: Not Currently    Comment: "4-5 drinks a year for special ocassions" 05/08/20  . Drug use: Never  . Sexual activity: Not on file  Other Topics Concern  . Not on file  Social History Narrative  . Not on file   Social Determinants of Health   Financial Resource Strain: Not on file  Food Insecurity: Not on file  Transportation Needs: Not on file  Physical Activity: Not on file  Stress: Not on file  Social Connections: Not on file  Intimate Partner Violence: Not on file  :  REVIEW OF SYSTEMS:  A 15 point review of systems is documented in the electronic medical record. This was obtained by the nursing staff. However, I reviewed this with the patient to discuss relevant findings and make appropriate changes.  Pertinent items are noted in HPI.   PHYSICAL EXAM:  Blood pressure 119/86, pulse 78, temperature 98.4 F (36.9 C), temperature source Axillary, resp. rate 17, height 6\' 3"  (1.905 m), weight 293 lb (132.9 kg), SpO2 95 %. Per ENT:  General: Alert, no acute distress Oral: Normal oral mucosa and tonsils.  Patient with a large base of tongue making visualization a little difficult but on fiberoptic laryngoscopy patient has a mass involving mostly the left side of the base of tongue with some ulceration.  Epiglottis is partially pushed back posteriorly.  Vocal cords were clear with normal vocal mobility. Nasal: Clear nasal passages Neck: No palpable thyroid nodules.  Enlarged left upper neck nodes. Ear: Ear canal is clear with normal appearing TMs Cardiovascular: Regular rate and rhythm, no murmur.  Respiratory:  Clear to auscultation Neurologic: Alert and oriented x 3  KPS = 80  100 - Normal; no complaints; no evidence of disease. 90   - Able to carry on normal activity; minor signs or symptoms of disease. 80   - Normal activity with effort; some signs or symptoms of disease. 43   - Cares for self; unable to carry on normal activity or to do active work. 60   - Requires occasional assistance, but is able to care for most of his personal needs. 50   - Requires considerable assistance and frequent medical care. 74   - Disabled; requires special care and assistance. 68   - Severely disabled; hospital admission is indicated although death not imminent. 23   - Very sick; hospital admission necessary; active supportive treatment necessary. 10   - Moribund; fatal processes progressing rapidly. 0     - Dead  Karnofsky DA, Abelmann WH, Craver LS and Burchenal [facility] [phone]) The use of the nitrogen mustards in the palliative treatment of carcinoma: with particular reference to bronchogenic carcinoma Cancer  1 634-56  LABORATORY DATA:  Lab Results  Component Value Date   WBC 16.3 (H) 05/10/2020   HGB 15.0 05/10/2020   HCT 44.3 05/10/2020   MCV 90.8 05/10/2020   PLT 218 05/10/2020   Lab Results  Component Value Date   NA 136 05/10/2020   K 4.4 05/10/2020   CL 100 05/10/2020   CO2 28 05/10/2020   Lab Results  Component Value Date   ALT 24 04/30/2020   AST 23 04/30/2020   ALKPHOS 52 04/30/2020   BILITOT 1.1 04/30/2020     RADIOGRAPHY: CT Soft Tissue Neck W Contrast  Result Date: 04/30/2020 CLINICAL DATA:  Soft tissue mass, neck, no prior imaging. EXAM: CT NECK WITH CONTRAST TECHNIQUE: Multidetector CT imaging of the neck was performed using the standard protocol following the bolus administration of intravenous contrast. CONTRAST:  59mL OMNIPAQUE IOHEXOL 300 MG/ML  SOLN COMPARISON:  No pertinent prior exams available for comparison. FINDINGS: Pharynx and larynx: There is a large soft tissue mass  centered in the region of the left tongue base which also extends posteriorly to the glossotonsillar sulcus and vallecula. The mass has poorly defined margins appears to cross midline and is estimated to measure at least 5.0 x 4.7 x 3.4 cm. The epiglottis and aryepiglottic folds are not well separately delineated and may be involved by the mass. Suspect tumor extension inferiorly at least to the level of the supraglottic larynx. Apparent tumor extension on the right into the strap muscles at the level of the supraglottic larynx (series 3, image 89). Infiltrated appearance of the pre epiglottic fat. There is a more subtly infiltrated appearance of the paraglottic fat. Additionally, there is asymmetric prominence of the left piriformis sinus suspicious for left vocal cord palsy. There is moderate/severe effacement of the oropharyngeal airway. Nonspecific edema within the floor of mouth/submandibular space. Salivary glands: No inflammation, mass, or stone. Thyroid: Unremarkable. Lymph nodes: Extensive bilateral cervical lymphadenopathy compatible with nodal metastatic disease. Some of the lymph nodes demonstrate abnormal internal low-density. Most notably, there is a bulky node/nodal conglomerate at the left level 2/3 stations measuring 6.2 x 3.4 cm (series 5, image 81). This lymph node has poorly defined margins concerning for extracapsular extension. Additional lymphadenopathy at the left level 2, 3 and 5 stations. An enlarged right level 2 lymph node measures 4.9 x 2.5 cm in transaxial dimensions. Additional lymphadenopathy at the right 2 and 3 stations. For instance, an enlarged right level 3 lymph node measures 1.7 cm in short axis. Vascular: The major vascular structures of the neck are patent. The mid left internal jugular vein is markedly narrowed by left level 2/3 lymphadenopathy. Limited intracranial: No acute intracranial abnormality is identified. Visualized orbits: No mass or acute finding. Mastoids and  visualized paranasal sinuses: Mild bilateral frontal sinus mucosal thickening. Extensive partial opacification of the bilateral ethmoid air cells. Mild right maxillary sinus mucosal thickening. Moderate left maxillary sinus mucosal thickening greatest inferiorly. Skeleton: Mild age-indeterminate C4 superior endplate vertebral compression deformity. Elsewhere, there is no acute bony abnormality or aggressive osseous lesion. Upper chest: No consolidation within the imaged lung apices. These results were called by telephone at the time of interpretation on 04/30/2020 at 2:38 pm to provider [facility] RAY , who verbally acknowledged these results. IMPRESSION: Large soft tissue mass centered in the region of the left tongue base also extending posteriorly to the glossotonsillar sulcus and vallecula. This portion of the mass has poorly defined margins but appears to cross midline and is estimated to  measure at least 5.0 x 4.7 x 3.4 cm. The epiglottis and aryepiglottic folds are not well separately delineated and may be involved by the mass. Suspect tumor extension inferiorly at least to the level of the supraglottic larynx. There is also apparent tumor extension on the right into the strap muscles at the level of the supraglottic larynx. Also of note, there is an infiltrated appearance of the pre-epiglottic fat and a more subtly infiltrated appearance of the paraglottic fat concerning for tumor infiltration. Furthermore, there is asymmetric prominence of the left piriformis sinus suspicious for left vocal cord palsy. ENT consultation is recommended. Results in moderate/severe effacement of the oropharyngeal airway. Nonspecific edema within the floor of mouth/submandibular space. 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 There is concern for extracapsular extension, as described. The mid left internal jugular vein is markedly effaced by lymphadenopathy. Paranasal sinus disease as  described. Mild age-indeterminate T4 superior endplate vertebral compression deformity. Electronically Signed   By: Kellie Simmering DO   On: 04/30/2020 14:38   DG CHEST PORT 1 VIEW  Result Date: 05/10/2020 CLINICAL DATA:  Tracheostomy. EXAM: PORTABLE CHEST 1 VIEW COMPARISON:  No prior. FINDINGS: Tracheostomy noted in stable position. Heart size normal. Low lung volumes with mild bibasilar atelectasis. Questionable tiny nodule left upper lung. PA lateral chest x-ray suggested for further evaluation. Tiny bilateral pleural effusions cannot be excluded. No pneumothorax. IMPRESSION: 1. Tracheostomy tube in stable position. 2. Low lung volumes with mild bibasilar atelectasis. Tiny bilateral pleural effusions cannot be excluded. 3. Questionable tiny pulmonary nodule left upper lung. PA lateral chest x-ray suggested for further evaluation. 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  He now has a stable airway and is recovering uneventfully.  He has not had staging PET-CT set for next Tuesday.  His molecular pathology for p16 and HPV are pending.  PLAN:Today, I talked to the patient about the findings and work-up thus far.  We discussed the natural history of locally advanced oropharyngeal cancer and general treatment, highlighting the role of radiotherapy in the management.  We discussed the available radiation techniques, and focused on the details of logistics and delivery.  We reviewed the anticipated acute and late sequelae associated with radiation in this setting.    At this point, I would suggest that the patient recover from recent surgery and ideally complete staging PET-CT as outpatient next week.  Following staging, I will arrange for the patient to follow-up with Dr. Eppie Gibson in our clinic to discuss radiation treatment from a  multidisciplinary perspective.  I spent 70 minutes on this encounter gathering medical records, reviewing notes, reviewing his CT scan, authoring this note, seeing the patient face-to-face, corresponding with multidisciplinary teammates for ongoing care.   ------------------------------------------------   Tyler Pita, MD [place]: [phone]  Fax: [phone] [email or web address]  LinkedIn

## 2020-05-10 NOTE — Evaluation (Signed)
Passy-Muir Speaking Valve - Evaluation Patient Details  Name: Alexander [name] MRN: [id number] Date of Birth: December 02, 1960  Today's Date: 05/10/2020 Time: 1028-1050 SLP Time Calculation (min) (ACUTE ONLY): 22 min  Past Medical History:  Past Medical History:  Diagnosis Date  . GERD (gastroesophageal reflux disease)   . Sleep apnea    Past Surgical History:  Past Surgical History:  Procedure Laterality Date  . DIRECT LARYNGOSCOPY  05/09/2020   Procedure: DIRECT LARYNGOSCOPY;  Surgeon: Rozetta Nunnery, MD;  Location: [facility];  Service: ENT;;  . MASS EXCISION N/A 05/09/2020   Procedure: EXCISION TONGUE MASS;  Surgeon: Rozetta Nunnery, MD;  Location: [facility];  Service: ENT;  Laterality: N/A;  . TONSILLECTOMY    . TRACHEOSTOMY TUBE PLACEMENT  05/09/2020   Procedure: TRACHEOSTOMY;  Surgeon: Rozetta Nunnery, MD;  Location: [facility];  Service: ENT;;   HPI:  Pt is admitted following biopsy of a large base of tongue mass. Under fiberoptic laryngoscopy in the ENT office, pt "had a large base of tongue mass that pushed the epiglottis back a little bit posteriorly.  On exam of the vocal cords, vocal cords were clear with normal vocal cord mobility otherwise."  During biopsy procedure pt could not be intubated due to mass and body habitus obstructing the airway and emergent trach was placed.   Assessment / Plan / Recommendation Clinical Impression  Pt demonstrates excellent tolerance of PMSV placement. SLP provided verbal and visual teaching to pt regarding PMSV prior to placement to assuage his anxiety. Pt is a retired [employer] account executive and is familiar with head and neck anatomy. Pt was able to orally expectorate secretions with PMSV in place and then achieve dysphonic but intelligible phonation and speech. Pt denies discomfort, no signs of airtrapping or change in vital signs over 20 minutes. Pt able to follow cues to increased breath support and volume while counting with a 1:1 word to breath ratio. Provided  verbal and written education regarding precautions. Pt may wear PMSV all waking hours. Demonstrated placement and removal with daughter. Will f/u for further education and care as needed.      SLP Assessment       Follow Up Recommendations  Home health SLP    Frequency and Duration min 2x/week       PMSV Trial PMSV was placed for: 20 minutes Able to Attain Phonation: Yes Voice Quality: Low vocal intensity;Hoarse Able to Expectorate Secretions: Yes Level of Secretion Expectoration with PMSV: Oral Breath Support for Phonation: Moderately decreased Intelligibility: Intelligible Respirations During Trial: 17 SpO2 During Trial: 98 %   Tracheostomy Tube  Additional Tracheostomy Tube Assessment Trach Collar Period: all waking hours Secretion Description: thin bloody    Vent Dependency  FiO2 (%): (S) 21 %    Cuff Deflation Trial  GO Tolerated Cuff Deflation: Yes Length of Time for Cuff Deflation Trial: yes Behavior: Alert;Cooperative Cuff Deflation Trial - Comments: deflated at baseline        DeBlois, Katherene Ponto 05/10/2020, 1:52 PM

## 2020-05-10 NOTE — Progress Notes (Signed)
Postop check Patient is status post direct laryngoscopy and biopsy with an emergent tracheostomy performed in the operating room yesterday. Patient is presently wearing a Passy-Muir valve on his tracheostomy is able to speak okay.  His daughter is in the room with him. He has had no significant bleeding from around the tracheostomy and no swelling of his neck. He is able to drink liquids with mild discomfort. 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 Presently he is scheduled for a PET scan at [facility] next Tuesday. Following the PET scan he will have an appointment arranged with radiation oncology concerning further treatment of this with probable chemoradiation as this is nonsurgical. He should follow-up in my office in 10 to 14days and discussed this with the patient as well as his daughter. He should be able to be discharged when he is comfortable with care of his trach at home and should be receiving trach care.

## 2020-05-11 ENCOUNTER — Inpatient Hospital Stay (HOSPITAL_COMMUNITY): Payer: BC Managed Care – PPO

## 2020-05-11 DIAGNOSIS — Z79899 Other long term (current) drug therapy: Secondary | ICD-10-CM

## 2020-05-11 DIAGNOSIS — G4733 Obstructive sleep apnea (adult) (pediatric): Secondary | ICD-10-CM

## 2020-05-11 DIAGNOSIS — R221 Localized swelling, mass and lump, neck: Secondary | ICD-10-CM

## 2020-05-11 DIAGNOSIS — R634 Abnormal weight loss: Secondary | ICD-10-CM

## 2020-05-11 DIAGNOSIS — Z930 Tracheostomy status: Secondary | ICD-10-CM

## 2020-05-11 DIAGNOSIS — K148 Other diseases of tongue: Secondary | ICD-10-CM

## 2020-05-11 DIAGNOSIS — C109 Malignant neoplasm of oropharynx, unspecified: Secondary | ICD-10-CM

## 2020-05-11 DIAGNOSIS — J9601 Acute respiratory failure with hypoxia: Secondary | ICD-10-CM

## 2020-05-11 DIAGNOSIS — R1319 Other dysphagia: Secondary | ICD-10-CM

## 2020-05-11 DIAGNOSIS — K219 Gastro-esophageal reflux disease without esophagitis: Secondary | ICD-10-CM

## 2020-05-11 LAB — BASIC METABOLIC PANEL
Anion gap: 10 (ref 5–15)
BUN: 13 mg/dL (ref 6–20)
CO2: 26 mmol/L (ref 22–32)
Calcium: 9.5 mg/dL (ref 8.9–10.3)
Chloride: 99 mmol/L (ref 98–111)
Creatinine, Ser: 1.1 mg/dL (ref 0.61–1.24)
GFR, Estimated: >60 mL/min (ref >60)
Glucose, Bld: 106 mg/dL — ABNORMAL HIGH (ref 70–99)
Potassium: 4.3 mmol/L (ref 3.5–5.1)
Sodium: 135 mmol/L (ref 135–145)

## 2020-05-11 LAB — CBC
HCT: 46 % (ref 39.0–52.0)
Hemoglobin: 15.5 g/dL (ref 13.0–17.0)
MCH: 30.4 pg (ref 26.0–34.0)
MCHC: 33.7 g/dL (ref 30.0–36.0)
MCV: 90.2 fL (ref 80.0–100.0)
Platelets: 206 10*3/uL (ref 150–400)
RBC: 5.1 MIL/uL (ref 4.22–5.81)
RDW: 14.3 % (ref 11.5–15.5)
WBC: 15.5 10*3/uL — ABNORMAL HIGH (ref 4.0–10.5)
nRBC: 0 % (ref 0.0–0.2)

## 2020-05-11 LAB — SURGICAL PATHOLOGY

## 2020-05-11 LAB — PHOSPHORUS: Phosphorus: 3.3 mg/dL (ref 2.5–4.6)

## 2020-05-11 LAB — MAGNESIUM: Magnesium: 2.2 mg/dL (ref 1.7–2.4)

## 2020-05-11 LAB — GLUCOSE, CAPILLARY: Glucose-Capillary: 99 mg/dL (ref 70–99)

## 2020-05-11 MED ORDER — ADULT MULTIVITAMIN LIQUID CH
15.0000 mL | Freq: Every day | ORAL | Status: DC
  Administered 2020-05-11 – 2020-05-12 (×2): 15 mL via ORAL
  Filled 2020-05-11 (×2): qty 15

## 2020-05-11 MED ORDER — IOHEXOL 300 MG/ML  SOLN
75.0000 mL | Freq: Once | INTRAMUSCULAR | Status: AC | PRN
  Administered 2020-05-11: 75 mL via INTRAVENOUS

## 2020-05-11 MED ORDER — ENOXAPARIN SODIUM 80 MG/0.8ML ~~LOC~~ SOLN
65.0000 mg | SUBCUTANEOUS | Status: DC
  Administered 2020-05-11 – 2020-05-12 (×2): 65 mg via SUBCUTANEOUS
  Filled 2020-05-11: qty 0.8
  Filled 2020-05-11: qty 0.65

## 2020-05-11 MED ORDER — MELATONIN 3 MG PO TABS: 3.0000 mg | ORAL_TABLET | Freq: Every evening | ORAL | Status: DC | PRN

## 2020-05-11 MED ORDER — PANTOPRAZOLE SODIUM 40 MG PO PACK
40.0000 mg | PACK | Freq: Every day | ORAL | Status: DC
  Administered 2020-05-11 – 2020-05-12 (×2): 40 mg via ORAL
  Filled 2020-05-11 (×2): qty 20

## 2020-05-11 NOTE — Progress Notes (Signed)
   Speech Language Pathology Treatment: Nada Boozer Speaking valve  Patient Details Name: Alexander [name] MRN: [id number] DOB: [birth year] Today's Date: 05/11/2020 Time: 4098-1191 SLP Time Calculation (min) (ACUTE ONLY): 26.98 min  Assessment / Plan / Recommendation Clinical Impression  Pt was alert and coopertive throughout the session and his fiancee was present. Both parties agreed that his voice and speech are notably different from his baseline and they were educated regarding the impact of the tumor on articulation and resonance. Both parties verbalized understanding as well as agreement. Vitals were RR 19, SpO2 95%, HR 83 at baseline. He tolerated PMSV for 27 minutes with vitals ranging RR 17-19, SpO2 93-95, and HR 76-85 during this period and pt denied any respiratory difficulty. Vocal quality was hoarse, vocal intensity reduced, articulatory precision reduced, and respiratory support reduced. Pt demonstrated clavicular breathing during attempts to increase breath support. He was educated regarding diaphragmatic breathing and was able to implement this with verbal prompts and tactile feedback of pt's hand on stomach. Vocal intensity and speech intelligibility were improved with facilitative technique of continuous phonation with /h/(I) words. PMSV may be used during all waking hours. SLP will continue to follow pt.    HPI HPI: Pt is admitted following biopsy of a large base of tongue mass. Under fiberoptic laryngoscopy in the ENT office, pt "had a large base of tongue mass that pushed the epiglottis back a little bit posteriorly.  On exam of the vocal cords, vocal cords were clear with normal vocal cord mobility otherwise."  During biopsy procedure pt could not be intubated due to mass and body habitus obstructing the airway and emergent trach was placed. CT soft tissue 3/14: Large soft tissue mass centered in the region of the left tongue  base also extending posteriorly to the glossotonsillar  sulcus and  vallecula. This portion of the mass has poorly defined margins but appears to cross midline and is estimated to measure at least 5.0 x  4.7 x 3.4 cm. The epiglottis and aryepiglottic folds are not well  separately delineated and may be involved by the mass. Suspect tumor  extension inferiorly at least to the level of the supraglottic  larynx. There is also apparent tumor extension on the right into the  strap muscles at the level of the supraglottic larynx.      SLP Plan  Continue with current plan of care       Recommendations         Patient may use Passy-Muir Speech Valve: During PO intake/meals;During all waking hours (remove during sleep);During all therapies with supervision PMSV Supervision: Intermittent MD: Please consider changing trach tube to : Cuffless         Oral Care Recommendations: Oral care BID Follow up Recommendations: Home health SLP SLP Visit Diagnosis: Dysphagia, oropharyngeal phase (R13.12) Plan: Continue with current plan of care       Shayaan Parke I. Hardin Negus, [place], [place] Office number [phone] Pager [place] 05/11/2020, 1:55 PM

## 2020-05-11 NOTE — Progress Notes (Addendum)
Patient ID: Alexander [name], male   DOB: [birth year], 60 y.o.   MRN: [id number]  PROGRESS NOTE    Vivian Okelley  GQB:[id number] DOB: [birth year] DOA: 05/09/2020 PCP: Patient, No Pcp Per   Brief Narrative:  60 year old male with history of GERD, OSA with recent diagnosis of left neck and left tongue base mass for which patient underwent he underwent elective biopsy by Dr. [name] on 05/09/2020.  Subsequently, patient was unable to be extubated and required emergent tracheostomy by ENT.  He was admitted to ICU.  PCCM was consulted.  Patient was subsequently switched to to trach collar.  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  Oncology and radiation oncology were consulted.  He was transferred to [facility] service on 05/11/2020.  Assessment & Plan:   Acute hypoxic respiratory failure status post emergent tracheostomy History of OSA -Patient underwent emergent tracheostomy and subsequently was switched to trach collar -Care has been transferred to [facility] service on 05/11/20 -Follow further ENT recommendations. -Diet advancement as per SLP.  Will try and advance to soft diet today: Patient currently hesitant/scared of advancing diet  Squamous cell carcinoma of tongue -He underwent biopsy on 05/09/2020.  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  He will need outpatient PET scan and follow-up with oncology and radiation oncology.  Possible port placement as an outpatient as well.  Leukocytosis -Probably reactive.  Monitor  GERD - continue Protonix   DVT prophylaxis: SCDs Code Status: Full Family Communication: None at bedside Disposition Plan: Status is: Inpatient  Remains inpatient appropriate because:Inpatient level of care appropriate due to severity of illness   Dispo: The patient is from: Home              Anticipated d/c is to: Home in 1 to 2 days if remains stable and tolerates advanced diet               Patient currently is not medically stable to d/c.   Difficult to place patient No  Consultants: ENT/PCCM  Procedures: Intubation/emergent tracheostomy  Antimicrobials: Perioperative   Subjective: Patient seen and examined at bedside.  Responds to questions by nodding his head.  Denies worsening cough, chest pain, abdominal pain, vomiting.  No overnight fever reported  Objective: Vitals:   05/11/20 0708 05/11/20 0715 05/11/20 0831 05/11/20 0832  BP: 122/88  102/67   Pulse: 73  92 94  Resp: 17  (!) 24 15  Temp:  98.5 F (36.9 C)    TempSrc:  Oral    SpO2: 92%  92% 92%  Weight:      Height:        Intake/Output Summary (Last 24 hours) at 05/11/2020 1117 Last data filed at 05/10/2020 2005 Gross per 24 hour  Intake 200 ml  Output --  Net 200 ml   Filed Weights   05/08/20 1247 05/09/20 1104  Weight: 133.8 kg 132.9 kg    Examination:  General exam: Appears calm and comfortable.  Currently on 5 L oxygen via tracheostomy ENT: Tracheostomy in place Respiratory system: Bilateral decreased breath sounds at bases with scattered crackles.  Intermittently tachypneic Cardiovascular system: S1 & S2 heard, Rate controlled Gastrointestinal system: Abdomen is obese, nondistended, soft and nontender. Normal bowel sounds heard. Extremities: No cyanosis, clubbing, edema  Central nervous system: Alert and awake. No focal neurological deficits. Moving extremities Skin: No rashes, lesions or ulcers Psychiatry: Flat affect.    Data Reviewed: I have personally reviewed following labs and imaging studies  CBC: Recent Labs  Lab 05/10/20 0210 05/11/20 0432  WBC 16.3* 15.5*  HGB 15.0 15.5  HCT 44.3 46.0  MCV 90.8 90.2  PLT 218 355   Basic Metabolic Panel: Recent Labs  Lab 05/10/20 0210 05/11/20 0432  NA 136 135  K 4.4 4.3  CL 100 99  CO2 28 26  GLUCOSE 128* 106*  BUN 10 13  CREATININE 1.07 1.10  CALCIUM 9.3 9.5  MG 2.2 2.2  PHOS 4.1 3.3   GFR: Estimated Creatinine  Clearance: 106.3 mL/min (by C-G formula based on SCr of 1.1 mg/dL). Liver Function Tests: No results for input(s): AST, ALT, ALKPHOS, BILITOT, PROT, ALBUMIN in the last 168 hours. No results for input(s): LIPASE, AMYLASE in the last 168 hours. No results for input(s): AMMONIA in the last 168 hours. Coagulation Profile: No results for input(s): INR, PROTIME in the last 168 hours. Cardiac Enzymes: No results for input(s): CKTOTAL, CKMB, CKMBINDEX, TROPONINI in the last 168 hours. BNP (last 3 results) No results for input(s): PROBNP in the last 8760 hours. HbA1C: No results for input(s): HGBA1C in the last 72 hours. CBG: Recent Labs  Lab 05/10/20 1122 05/10/20 1515 05/10/20 1907 05/10/20 2313 05/11/20 0302  GLUCAP 100* 99 101* 107* 99   Lipid Profile: No results for input(s): CHOL, HDL, LDLCALC, TRIG, CHOLHDL, LDLDIRECT in the last 72 hours. Thyroid Function Tests: No results for input(s): TSH, T4TOTAL, FREET4, T3FREE, THYROIDAB in the last 72 hours. Anemia Panel: No results for input(s): VITAMINB12, FOLATE, FERRITIN, TIBC, IRON, RETICCTPCT in the last 72 hours. Sepsis Labs: No results for input(s): PROCALCITON, LATICACIDVEN in the last 168 hours.  Recent Results (from the past 240 hour(s))  SARS CORONAVIRUS 2 (TAT 6-24 HRS) Nasopharyngeal Nasopharyngeal Swab     Status: None   Collection Time: 05/07/20 10:01 AM   Specimen: Nasopharyngeal Swab  Result Value Ref Range Status   SARS Coronavirus 2 NEGATIVE NEGATIVE Final    Comment: (NOTE) SARS-CoV-2 target nucleic acids are NOT DETECTED.  The SARS-CoV-2 RNA is generally detectable in upper and lower respiratory specimens during the acute phase of infection. Negative results do not preclude SARS-CoV-2 infection, do not rule out co-infections with other pathogens, and should not be used as the sole basis for treatment or other patient management decisions. Negative results must be combined with clinical observations, patient  history, and epidemiological information. The expected result is Negative.  Fact Sheet for Patients: SugarRoll.be  Fact Sheet for Healthcare Providers: https://www.woods-mathews.com/  This test is not yet approved or cleared by the [place] FDA and  has been authorized for detection and/or diagnosis of SARS-CoV-2 by FDA under an Emergency Use Authorization (EUA). This EUA will remain  in effect (meaning this test can be used) for the duration of the COVID-19 declaration under Se ction 564(b)(1) of the Act, 21 U.S.C. section 360bbb-3(b)(1), unless the authorization is terminated or revoked sooner.  Performed at [facility], [place] [address] [place], [place] [address]   MRSA PCR Screening     Status: None   Collection Time: 05/09/20  6:29 PM   Specimen: Nasal Mucosa; Nasopharyngeal  Result Value Ref Range Status   MRSA by PCR NEGATIVE NEGATIVE Final    Comment:        The GeneXpert MRSA Assay (FDA approved for NASAL specimens only), is one component of a comprehensive MRSA colonization surveillance program. It is not intended to diagnose MRSA infection nor to guide or monitor treatment for MRSA infections. Performed at [facility]  [facility], [place] [address] [place], [place] [address]          Radiology Studies: CT CHEST W CONTRAST  Result Date: 05/11/2020 CLINICAL DATA:  60 year old male with left tongue base mass. EXAM: CT CHEST WITH CONTRAST TECHNIQUE: Multidetector CT imaging of the chest was performed during intravenous contrast administration. CONTRAST:  16mL OMNIPAQUE IOHEXOL 300 MG/ML  SOLN COMPARISON:  Chest radiograph dated 05/10/2020 FINDINGS: Cardiovascular: There is no cardiomegaly or pericardial effusion. The thoracic aorta is unremarkable. The origins of the great vessels of the aortic arch appear patent as visualized. The central pulmonary arteries appear patent for the degree of opacification.  Mediastinum/Nodes: Subcarinal mass/adenopathy measures 2.3 cm in short axis. No hilar adenopathy. The esophagus is grossly unremarkable. No mediastinal fluid collection. Small amount of fluid and air in the anterior mediastinum likely secondary to tracheostomy. Lungs/Pleura: There is shallow inspiration with bibasilar atelectasis. No pleural effusion or pneumothorax. The central airways are patent. A tracheostomy is noted with tip approximately 4.5 cm above the carina. Upper Abdomen: Probable fatty liver. Musculoskeletal: Degenerative changes of the spine. No acute osseous pathology. IMPRESSION: 1. No acute intrathoracic pathology. 2. Subcarinal mass/adenopathy. 3. Low lung volumes with bibasilar atelectasis. Electronically Signed   By: Anner Crete M.D.   On: 05/11/2020 02:06   DG CHEST PORT 1 VIEW  Result Date: 05/10/2020 CLINICAL DATA:  Tracheostomy. EXAM: PORTABLE CHEST 1 VIEW COMPARISON:  No prior. FINDINGS: Tracheostomy noted in stable position. Heart size normal. Low lung volumes with mild bibasilar atelectasis. Questionable tiny nodule left upper lung. PA lateral chest x-ray suggested for further evaluation. Tiny bilateral pleural effusions cannot be excluded. No pneumothorax. IMPRESSION: 1. Tracheostomy tube in stable position. 2. Low lung volumes with mild bibasilar atelectasis. Tiny bilateral pleural effusions cannot be excluded. 3. Questionable tiny pulmonary nodule left upper lung. PA lateral chest x-ray suggested for further evaluation. Electronically Signed   By: [place]   On: 05/10/2020 08:16        Scheduled Meds: . bacitracin  1 application Topical L8L  . Chlorhexidine Gluconate Cloth  6 each Topical Daily  . pantoprazole sodium  40 mg Oral Daily   Continuous Infusions:        Aline August, MD [facility] 05/11/2020, 11:17 AM

## 2020-05-11 NOTE — Progress Notes (Signed)
PT Cancellation Note  Patient Details Name: Alexander [name] MRN: [id number] DOB: December 14, 1960   Cancelled Treatment:    Reason Eval/Treat Not Completed: PT screened, no needs identified, will sign off. Pt is amb without difficulty. Amb from 30M to 2W. Instructed pt to have staff free him from lines to be able to amb several times a day.   Shary Decamp [facility] 05/11/2020, 4:21 PM [place] Pager [phone] Office [phone]

## 2020-05-11 NOTE — Progress Notes (Signed)
Speech Language Pathology Treatment: Dysphagia  Patient Details Name: Alexander [name] MRN: [id number] DOB: Jan 24, 1961 Today's Date: 05/11/2020 Time: 5885-0277 SLP Time Calculation (min) (ACUTE ONLY): 24.03 min  Assessment / Plan / Recommendation Clinical Impression  Pt was seen for dysphagia and PMSV treatment. PMSV care was deferred during the earlier session to allow MD to evaluate the pt. SLP returned on this date since pt may be discharged prior to next planned SLP session. Pt and his fiancee reported that the pt tolerated the upgraded soft diet. Pt reported that it has been easier for him to swallow solids with liquids or using a liquid wash. However, he denied any symptoms of pharyngeal dysphagia. Pt stated he is comfortable with the soft food that he received for lunch and that he was able to eat the mashed potatoes and some of the roast beef without difficulty. No s/sx of aspiration were noted with chopped peaches, mixed consistency boluses, or thin liquids via straw. Mastication as mildly prolonged, but functional and pt consistently used a liquid wash. PMSV was in place throughout the session and vitals were stable. Pt and his fiancee were educated regarding PMSV care and precautions. They were referred to the provided PMSV literature and specific pages regarding care were highlighted. Pt was educated on donning and doffing. He was was independent with this following initial instruction and cueing for hand/finger placement. Pt initially used his phone's camera for visual feedback, but this was soon faded. Pt reported that he is hopeful that he will be discharged over the weekend. A dysphagia 3 diet is recommended at this time. SLP will continue to follow pt acutely and continued SLP services are recommended following discharge.    HPI HPI: Pt is admitted following biopsy of a large base of tongue mass. Under fiberoptic laryngoscopy in the ENT office, pt "had a large base of tongue mass that  pushed the epiglottis back a little bit posteriorly.  On exam of the vocal cords, vocal cords were clear with normal vocal cord mobility otherwise."  During biopsy procedure pt could not be intubated due to mass and body habitus obstructing the airway and emergent trach was placed. CT soft tissue 3/14: Large soft tissue mass centered in the region of the left tongue  base also extending posteriorly to the glossotonsillar sulcus and  vallecula. This portion of the mass has poorly defined margins but appears to cross midline and is estimated to measure at least 5.0 x  4.7 x 3.4 cm. The epiglottis and aryepiglottic folds are not well  separately delineated and may be involved by the mass. Suspect tumor  extension inferiorly at least to the level of the supraglottic  larynx. There is also apparent tumor extension on the right into the  strap muscles at the level of the supraglottic larynx. Plan for radiation treatment.      SLP Plan  Continue with current plan of care       Recommendations  Diet recommendations: Dysphagia 3 (mechanical soft);Thin liquid Liquids provided via: Cup;Straw Medication Administration: Crushed with puree Supervision: Staff to assist with self feeding Compensations: Slow rate;Small sips/bites;Follow solids with liquid Postural Changes and/or Swallow Maneuvers: Seated upright 90 degrees      Patient may use Passy-Muir Speech Valve: During PO intake/meals;During all waking hours (remove during sleep);During all therapies with supervision PMSV Supervision: Intermittent MD: Please consider changing trach tube to : Cuffless         Oral Care Recommendations: Oral care BID Follow up Recommendations: Home health  SLP SLP Visit Diagnosis: Dysphagia, oropharyngeal phase (R13.12) Plan: Continue with current plan of care       Verdene Creson I. Hardin Negus, [place], [place] Office number [phone] Pager [place] 05/11/2020, 2:52 PM

## 2020-05-11 NOTE — Progress Notes (Addendum)
NAME:  Alexander [name], MRN:  [id number], DOB:  Nov 22, 1960, LOS: 2 ADMISSION DATE:  05/09/2020, CONSULTATION DATE:  05/09/2020 REFERRING MD:  Dr. [name], CHIEF COMPLAINT:  Emergent trach    History of Present Illness:  Alexander [name] is a 60 y.o. male with PMH significant for recent left neck and left tongue base mass seen for which ENT was consulted 05/07/2020 CT confirmed a large base of the tongue mass on the left side and enlarged left neck node consistent with possible carcinoma. Given concern for cancer, patient elective for biopsy with Dr. [name] today 3/23.   Per anesthesia initially the palm was to preform an awake intubation but decision was later made to precede with sedation for intubation. Per ENT, Dr, [name] patient was unable to be intubated and hypoxia ensued resulting in need for emergent tracheostomy. ENT reported 4-5 min of hypoxia during establishment of trach.   PCCM consulted for further management.     Pertinent  Medical History   Sleep apnea, [facility] Events: Including procedures, antibiotic start and stop dates in addition to other pertinent events    3/23 > Left tongue and neck mass prompting biopsy and need for emergent trach given inability to endotracheal intubate   Interim History / Subjective:  Awake and alert.  Has passed swallow evaluation.  Currently on room room air.  Objective   Blood pressure 102/67, pulse 94, temperature 98.5 F (36.9 C), temperature source Oral, resp. rate 15, height 6\' 3"  (1.905 m), weight 132.9 kg, SpO2 92 %.    FiO2 (%):  [21 %] 21 %   Intake/Output Summary (Last 24 hours) at 05/11/2020 1124 Last data filed at 05/10/2020 2005 Gross per 24 hour  Intake 200 ml  Output --  Net 200 ml   Filed Weights   05/08/20 1247 05/09/20 1104  Weight: 133.8 kg 132.9 kg    Examination: General: Well-nourished well-developed male no acute distress HEENT: Tracheostomy is in place.  Currently on 21% FiO2 via humidified  trach collar.  Sutures in place on trach.  This is a cuffed trach. Neuro: Grossly intact without focal defects awake alert CV: Heart sounds are regular without murmur  PULM: Clear to auscultation   GI: soft, bsx4 active  GU: Voids Extremities: warm/dry,  edema  Skin: no rashes or lesions   Labs/imaging that I havepersonally reviewed   PTA 3/14 CT soft tissue neck -- large soft tissue mass at left tongue base  No current labs or images to view at time of assessment   Resolved Hospital Problem list   Periop bradycardia  Assessment & Plan:  60 yo s/p emergent trach in OR for tongue mass excision. Post op, ENT has asked PCCM to take over as primary for ongoing care.   Acute encephalopathy -suspect this is related to sedation for case. Was hypoxic spo2 50s x about 5 minutes-- consider this if doesn't improve after metabolism of meds P Resolved  Acute respiratory failure with hypoxia requiring emergent tracheostomy -unable to intubate HX of Sleep apnea  P Respiratory failure resolved with emergency trach performed on 05/09/2020 per ENT Meets criteria for moving out of the intensive care unit Note his tracheostomy is cuffed and sutures remain in place. Tracheostomy change and suture removal will need to be done by the ENT physician office follow-up. Instructions given the patient concerning a cuffed trach not to any time inflate the cuff.  Also not to remove the sutures until done by ENT physician  Tongue mass s/p excision -base of tongue excised 3/23 P Pathology follow.  Per ENT Radiation and medical oncology consult completed  Discussion: Pulmonary critical care is signed off.  He will need to follow-up with ENT for trach change suture removal and further evaluation of airway per ENT. He can follow-up with pulmonary critical care is trach clinic in the future. [phone] He and daughters will need training on trach care  He has had radiation and medical oncology  consults with treatments to ensue in the future. He can do inhalers vis trach collar. He was instructed that he has a cuffed trach therefore not to inflate the cuff at any time.  He also instructed he has sutures in the trach does not to be removed until ENT sees him. Check with ENT concerning need for prednisone since he now has tracheostomy.  GERD P PPI  Was on preop  Best practice (evaluated daily)  Diet:  regular Pain/Anxiety/Delirium protocol (if indicated): no VAP protocol (if indicated): Not indicated DVT prophylaxis: SCD GI prophylaxis: N/A Glucose control:  Monitor Central venous access:  N/A Arterial line:  N/A Foley:  N/A Mobility:  bed rest  PT consulted: Yes Last date of multidisciplinary goals of care discussion: N/A Code Status:  full code Disposition:SDU  Labs   CBC: Recent Labs  Lab 05/10/20 0210 05/11/20 0432  WBC 16.3* 15.5*  HGB 15.0 15.5  HCT 44.3 46.0  MCV 90.8 90.2  PLT 218 940    Basic Metabolic Panel: Recent Labs  Lab 05/10/20 0210 05/11/20 0432  NA 136 135  K 4.4 4.3  CL 100 99  CO2 28 26  GLUCOSE 128* 106*  BUN 10 13  CREATININE 1.07 1.10  CALCIUM 9.3 9.5  MG 2.2 2.2  PHOS 4.1 3.3   GFR: Estimated Creatinine Clearance: 106.3 mL/min (by C-G formula based on SCr of 1.1 mg/dL). Recent Labs  Lab 05/10/20 0210 05/11/20 0432  WBC 16.3* 15.5*    Liver Function Tests: No results for input(s): AST, ALT, ALKPHOS, BILITOT, PROT, ALBUMIN in the last 168 hours. No results for input(s): LIPASE, AMYLASE in the last 168 hours. No results for input(s): AMMONIA in the last 168 hours.  ABG No results found for: PHART, PCO2ART, PO2ART, HCO3, TCO2, ACIDBASEDEF, O2SAT   Coagulation Profile: No results for input(s): INR, PROTIME in the last 168 hours.  Cardiac Enzymes: No results for input(s): CKTOTAL, CKMB, CKMBINDEX, TROPONINI in the last 168 hours.  HbA1C: No results found for: HGBA1C  CBG: Recent Labs  Lab 05/10/20 1122  05/10/20 1515 05/10/20 1907 05/10/20 2313 05/11/20 0302  GLUCAP 100* 99 101* 107* 99    Signature:    Richardson Landry Alexander [name] ACNP Acute Care Nurse Practitioner [place] Please consult Amion 05/11/2020, 11:24 AM

## 2020-05-12 LAB — BASIC METABOLIC PANEL
Anion gap: 7 (ref 5–15)
BUN: 14 mg/dL (ref 6–20)
CO2: 29 mmol/L (ref 22–32)
Calcium: 9.4 mg/dL (ref 8.9–10.3)
Chloride: 100 mmol/L (ref 98–111)
Creatinine, Ser: 1.08 mg/dL (ref 0.61–1.24)
GFR, Estimated: >60 mL/min (ref >60)
Glucose, Bld: 118 mg/dL — ABNORMAL HIGH (ref 70–99)
Potassium: 3.6 mmol/L (ref 3.5–5.1)
Sodium: 136 mmol/L (ref 135–145)

## 2020-05-12 LAB — MAGNESIUM: Magnesium: 2.2 mg/dL (ref 1.7–2.4)

## 2020-05-12 LAB — CBC
HCT: 45.5 % (ref 39.0–52.0)
Hemoglobin: 14.8 g/dL (ref 13.0–17.0)
MCH: 30.2 pg (ref 26.0–34.0)
MCHC: 32.5 g/dL (ref 30.0–36.0)
MCV: 92.9 fL (ref 80.0–100.0)
Platelets: 203 10*3/uL (ref 150–400)
RBC: 4.9 MIL/uL (ref 4.22–5.81)
RDW: 14.3 % (ref 11.5–15.5)
WBC: 13.3 10*3/uL — ABNORMAL HIGH (ref 4.0–10.5)
nRBC: 0 % (ref 0.0–0.2)

## 2020-05-12 MED ORDER — BACITRACIN ZINC 500 UNIT/GM EX OINT: 1.0000 "application " | TOPICAL_OINTMENT | Freq: Three times a day (TID) | CUTANEOUS | 0 refills | Status: DC

## 2020-05-12 NOTE — Discharge Summary (Signed)
Physician Discharge Summary  Alexander [name] TIW:[id number] DOB: November 14, 1960 DOA: 05/09/2020  PCP: Patient, No Pcp Per  Admit date: 05/09/2020 Discharge date: 05/12/2020  Admitted From: Home Disposition: Home  Recommendations for Outpatient Follow-up:  1. Follow up with PCP in 1 week with repeat CBC/BMP 2. Outpatient follow-up with ENT/oncology/radiation oncology 3. Follow up in ED if symptoms worsen or new appear   Home Health: No Equipment/Devices: Tracheostomy  Discharge Condition: Stable CODE STATUS: Full Diet recommendation: Heart healthy/dysphagia 3 diet as per SLP recommendations  Brief/Interim Summary: 60 year old male with history of GERD, OSA with recent diagnosis of left neck and left tongue base mass for which patient underwent he underwent elective biopsy by Dr. [name] on 05/09/2020.  Subsequently, patient was unable to be extubated and required emergent tracheostomy by ENT.  He was admitted to ICU.  PCCM was consulted.  Patient was subsequently switched to to trach collar.  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  Oncology and radiation oncology were consulted.  He was transferred to [facility] service on 05/11/2020.  Patient has been placed on diet as per SLP recommendations.  He will be discharged home today once patient/family have been taught about trach care.  Outpatient follow-up with ENT/oncology/radiation oncology.  Discharge Diagnoses:   Acute hypoxic respiratory failure status post emergent tracheostomy History of OSA -Patient underwent emergent tracheostomy and subsequently was switched to trach collar -Currently requiring 5 L oxygen via trach -Care has been transferred to [facility] service on 05/11/20 -Follow further ENT recommendations. -Diet advancement as per SLP.  -He will be discharged home today once patient/family have been taught about trach care.  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  He will need outpatient PET scan and follow-up with oncology and radiation oncology.  Possible port placement as an outpatient as well.  Leukocytosis -Probably reactive.    Improving  GERD - continue PPI Discharge Instructions  Discharge Instructions    Diet - low sodium heart healthy   Complete by: As directed    Dysphagia 3 diet   Discharge wound care:   Complete by: As directed    Trach care as per ENT recommendations/trach team   Increase activity slowly   Complete by: As directed      Allergies as of 05/12/2020   No Known Allergies     Medication List    STOP taking these medications   MULTIVITAMIN ADULT PO   predniSONE 10 MG tablet Commonly known as: DELTASONE     TAKE these medications   albuterol 108 (90 Base) MCG/ACT inhaler Commonly known as: VENTOLIN HFA Inhale 2 puffs into the lungs as needed for wheezing or shortness of breath.   bacitracin ointment Apply 1 application topically every 8 (eight) hours. Apply around trach wound   cetirizine 10 MG tablet Commonly known as: ZYRTEC Take 10 mg by mouth daily.   fluticasone 50 MCG/ACT nasal spray Commonly known as: FLONASE Place 1 spray into both nostrils daily.   Fluticasone-Salmeterol 100-50 MCG/DOSE Aepb Commonly known as: ADVAIR Inhale 1 puff into the lungs daily.   omeprazole 40 MG capsule Commonly known as: PRILOSEC Take 40 mg by mouth daily.   VITAMIN C PO Take 1 tablet by mouth daily.   ZINC PO Take 1 tablet by mouth daily.            Discharge Care Instructions  (From admission,  onward)         Start     Ordered   05/12/20 0000  Discharge wound care:       Comments: Trach care as per ENT recommendations/trach team   05/12/20 [phone]          Follow-up Information    Rozetta Nunnery, MD.   Specialty: Otolaryngology Why: Call office for follow up appt  in 11-15 days Contact information: [place] [address] [phone]        pcp. Schedule an appointment as soon as possible for a visit in 1 week(s).              No Known Allergies  Consultations:  ENT/PCCM/oncology/radiation oncology   Procedures/Studies: CT Soft Tissue Neck W Contrast  Result Date: 04/30/2020 CLINICAL DATA:  Soft tissue mass, neck, no prior imaging. EXAM: CT NECK WITH CONTRAST TECHNIQUE: Multidetector CT imaging of the neck was performed using the standard protocol following the bolus administration of intravenous contrast. CONTRAST:  56mL OMNIPAQUE IOHEXOL 300 MG/ML  SOLN COMPARISON:  No pertinent prior exams available for comparison. FINDINGS: Pharynx and larynx: There is a large soft tissue mass centered in the region of the left tongue base which also extends posteriorly to the glossotonsillar sulcus and vallecula. The mass has poorly defined margins appears to cross midline and is estimated to measure at least 5.0 x 4.7 x 3.4 cm. The epiglottis and aryepiglottic folds are not well separately delineated and may be involved by the mass. Suspect tumor extension inferiorly at least to the level of the supraglottic larynx. Apparent tumor extension on the right into the strap muscles at the level of the supraglottic larynx (series 3, image 89). Infiltrated appearance of the pre epiglottic fat. There is a more subtly infiltrated appearance of the paraglottic fat. Additionally, there is asymmetric prominence of the left piriformis sinus suspicious for left vocal cord palsy. There is moderate/severe effacement of the oropharyngeal airway. Nonspecific edema within the floor of mouth/submandibular space. Salivary glands: No inflammation, mass, or stone. Thyroid: Unremarkable. Lymph nodes: Extensive bilateral cervical lymphadenopathy compatible with nodal metastatic disease. Some of the lymph nodes demonstrate abnormal internal low-density. Most  notably, there is a bulky node/nodal conglomerate at the left level 2/3 stations measuring 6.2 x 3.4 cm (series 5, image 81). This lymph node has poorly defined margins concerning for extracapsular extension. Additional lymphadenopathy at the left level 2, 3 and 5 stations. An enlarged right level 2 lymph node measures 4.9 x 2.5 cm in transaxial dimensions. Additional lymphadenopathy at the right 2 and 3 stations. For instance, an enlarged right level 3 lymph node measures 1.7 cm in short axis. Vascular: The major vascular structures of the neck are patent. The mid left internal jugular vein is markedly narrowed by left level 2/3 lymphadenopathy. Limited intracranial: No acute intracranial abnormality is identified. Visualized orbits: No mass or acute finding. Mastoids and visualized paranasal sinuses: Mild bilateral frontal sinus mucosal thickening. Extensive partial opacification of the bilateral ethmoid air cells. Mild right maxillary sinus mucosal thickening. Moderate left maxillary sinus mucosal thickening greatest inferiorly. Skeleton: Mild age-indeterminate C4 superior endplate vertebral compression deformity. Elsewhere, there is no acute bony abnormality or aggressive osseous lesion. Upper chest: No consolidation within the imaged lung apices. These results were called by telephone at the time of interpretation on 04/30/2020 at 2:38 pm to provider [facility] RAY , who verbally acknowledged these results. IMPRESSION: Large soft tissue mass centered in the region of the left  tongue base also extending posteriorly to the glossotonsillar sulcus and vallecula. This portion of the mass has poorly defined margins but appears to cross midline and is estimated to measure at least 5.0 x 4.7 x 3.4 cm. The epiglottis and aryepiglottic folds are not well separately delineated and may be involved by the mass. Suspect tumor extension inferiorly at least to the level of the supraglottic larynx. There is also apparent tumor  extension on the right into the strap muscles at the level of the supraglottic larynx. Also of note, there is an infiltrated appearance of the pre-epiglottic fat and a more subtly infiltrated appearance of the paraglottic fat concerning for tumor infiltration. Furthermore, there is asymmetric prominence of the left piriformis sinus suspicious for left vocal cord palsy. ENT consultation is recommended. Results in moderate/severe effacement of the oropharyngeal airway. Nonspecific edema within the floor of mouth/submandibular space. 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 There is concern for extracapsular extension, as described. The mid left internal jugular vein is markedly effaced by lymphadenopathy. Paranasal sinus disease as described. Mild age-indeterminate T4 superior endplate vertebral compression deformity. Electronically Signed   By: Kellie Simmering DO   On: 04/30/2020 14:38   CT CHEST W CONTRAST  Result Date: 05/11/2020 CLINICAL DATA:  60 year old male with left tongue base mass. EXAM: CT CHEST WITH CONTRAST TECHNIQUE: Multidetector CT imaging of the chest was performed during intravenous contrast administration. CONTRAST:  21mL OMNIPAQUE IOHEXOL 300 MG/ML  SOLN COMPARISON:  Chest radiograph dated 05/10/2020 FINDINGS: Cardiovascular: There is no cardiomegaly or pericardial effusion. The thoracic aorta is unremarkable. The origins of the great vessels of the aortic arch appear patent as visualized. The central pulmonary arteries appear patent for the degree of opacification. Mediastinum/Nodes: Subcarinal mass/adenopathy measures 2.3 cm in short axis. No hilar adenopathy. The esophagus is grossly unremarkable. No mediastinal fluid collection. Small amount of fluid and air in the anterior mediastinum likely secondary to tracheostomy. Lungs/Pleura: There is shallow inspiration with bibasilar atelectasis. No pleural effusion or pneumothorax. The central airways  are patent. A tracheostomy is noted with tip approximately 4.5 cm above the carina. Upper Abdomen: Probable fatty liver. Musculoskeletal: Degenerative changes of the spine. No acute osseous pathology. IMPRESSION: 1. No acute intrathoracic pathology. 2. Subcarinal mass/adenopathy. 3. Low lung volumes with bibasilar atelectasis. Electronically Signed   By: Anner Crete M.D.   On: 05/11/2020 02:06   DG CHEST PORT 1 VIEW  Result Date: 05/10/2020 CLINICAL DATA:  Tracheostomy. EXAM: PORTABLE CHEST 1 VIEW COMPARISON:  No prior. FINDINGS: Tracheostomy noted in stable position. Heart size normal. Low lung volumes with mild bibasilar atelectasis. Questionable tiny nodule left upper lung. PA lateral chest x-ray suggested for further evaluation. Tiny bilateral pleural effusions cannot be excluded. No pneumothorax. IMPRESSION: 1. Tracheostomy tube in stable position. 2. Low lung volumes with mild bibasilar atelectasis. Tiny bilateral pleural effusions cannot be excluded. 3. Questionable tiny pulmonary nodule left upper lung. PA lateral chest x-ray suggested for further evaluation. Electronically Signed   By: Marcello Moores  Register   On: 05/10/2020 08:16       Subjective: Patient seen and examined at bedside.  Denies any overnight fever, vomiting.  Tolerating some diet.  Waiting for his daughter's to be taught about trach care.  Discharge Exam: Vitals:   05/12/20 0423 05/12/20 0433  BP:  129/71  Pulse: 84 81  Resp: 18 18  Temp:  98.6 F (37 C)  SpO2: 94% 93%    General: Pt is alert, awake, not in  acute distress.  Currently has trach and is on 5 L oxygen via trach Cardiovascular: rate controlled, S1/S2 + Respiratory: bilateral decreased breath sounds at bases with some scattered crackles Abdominal: Soft, NT, ND, bowel sounds + Extremities: Trace lower extremity edema; no cyanosis    The results of significant diagnostics from this hospitalization (including imaging, microbiology, ancillary and  laboratory) are listed below for reference.     Microbiology: Recent Results (from the past 240 hour(s))  SARS CORONAVIRUS 2 (TAT 6-24 HRS) Nasopharyngeal Nasopharyngeal Swab     Status: None   Collection Time: 05/07/20 10:01 AM   Specimen: Nasopharyngeal Swab  Result Value Ref Range Status   SARS Coronavirus 2 NEGATIVE NEGATIVE Final    Comment: (NOTE) SARS-CoV-2 target nucleic acids are NOT DETECTED.  The SARS-CoV-2 RNA is generally detectable in upper and lower respiratory specimens during the acute phase of infection. Negative results do not preclude SARS-CoV-2 infection, do not rule out co-infections with other pathogens, and should not be used as the sole basis for treatment or other patient management decisions. Negative results must be combined with clinical observations, patient history, and epidemiological information. The expected result is Negative.  Fact Sheet for Patients: SugarRoll.be  Fact Sheet for Healthcare Providers: https://www.woods-mathews.com/  This test is not yet approved or cleared by the [place] FDA and  has been authorized for detection and/or diagnosis of SARS-CoV-2 by FDA under an Emergency Use Authorization (EUA). This EUA will remain  in effect (meaning this test can be used) for the duration of the COVID-19 declaration under Se ction 564(b)(1) of the Act, 21 U.S.C. section 360bbb-3(b)(1), unless the authorization is terminated or revoked sooner.  Performed at [facility], [place] [address] [place], [place] [address]   MRSA PCR Screening     Status: None   Collection Time: 05/09/20  6:29 PM   Specimen: Nasal Mucosa; Nasopharyngeal  Result Value Ref Range Status   MRSA by PCR NEGATIVE NEGATIVE Final    Comment:        The GeneXpert MRSA Assay (FDA approved for NASAL specimens only), is one component of a comprehensive MRSA colonization surveillance program. It is not intended to  diagnose MRSA infection nor to guide or monitor treatment for MRSA infections. Performed at [facility], [place] [address] [place], [place] [address]      Labs: BNP (last 3 results) No results for input(s): BNP in the last 8760 hours. Basic Metabolic Panel: Recent Labs  Lab 05/10/20 0210 05/11/20 0432 05/12/20 0142  NA 136 135 136  K 4.4 4.3 3.6  CL 100 99 100  CO2 28 26 29   GLUCOSE 128* 106* 118*  BUN 10 13 14   CREATININE 1.07 1.10 1.08  CALCIUM 9.3 9.5 9.4  MG 2.2 2.2 2.2  PHOS 4.1 3.3  --    Liver Function Tests: No results for input(s): AST, ALT, ALKPHOS, BILITOT, PROT, ALBUMIN in the last 168 hours. No results for input(s): LIPASE, AMYLASE in the last 168 hours. No results for input(s): AMMONIA in the last 168 hours. CBC: Recent Labs  Lab 05/10/20 0210 05/11/20 0432 05/12/20 0142  WBC 16.3* 15.5* 13.3*  HGB 15.0 15.5 14.8  HCT 44.3 46.0 45.5  MCV 90.8 90.2 92.9  PLT 218 206 203   Cardiac Enzymes: No results for input(s): CKTOTAL, CKMB, CKMBINDEX, TROPONINI in the last 168 hours. BNP: Invalid input(s): POCBNP CBG: Recent Labs  Lab 05/10/20 1122 05/10/20 1515 05/10/20 1907 05/10/20 2313 05/11/20 0302  GLUCAP 100* 99 101* 107* 99   D-Dimer No results for input(s): DDIMER in the last 72 hours. Hgb A1c No results for input(s): HGBA1C in the last 72 hours. Lipid Profile No results for input(s): CHOL, HDL, LDLCALC, TRIG, CHOLHDL, LDLDIRECT in the last 72 hours. Thyroid function studies No results for input(s): TSH, T4TOTAL, T3FREE, THYROIDAB in the last 72 hours.  Invalid input(s): FREET3 Anemia work up No results for input(s): VITAMINB12, FOLATE, FERRITIN, TIBC, IRON, RETICCTPCT in the last 72 hours. Urinalysis No results found for: COLORURINE, APPEARANCEUR, [place], [place], [place], [place], [place], [place], PROTEINUR, UROBILINOGEN, NITRITE, LEUKOCYTESUR Sepsis Labs Invalid input(s): PROCALCITONIN,  WBC,   LACTICIDVEN Microbiology Recent Results (from the past 240 hour(s))  SARS CORONAVIRUS 2 (TAT 6-24 HRS) Nasopharyngeal Nasopharyngeal Swab     Status: None   Collection Time: 05/07/20 10:01 AM   Specimen: Nasopharyngeal Swab  Result Value Ref Range Status   SARS Coronavirus 2 NEGATIVE NEGATIVE Final    Comment: (NOTE) SARS-CoV-2 target nucleic acids are NOT DETECTED.  The SARS-CoV-2 RNA is generally detectable in upper and lower respiratory specimens during the acute phase of infection. Negative results do not preclude SARS-CoV-2 infection, do not rule out co-infections with other pathogens, and should not be used as the sole basis for treatment or other patient management decisions. Negative results must be combined with clinical observations, patient history, and epidemiological information. The expected result is Negative.  Fact Sheet for Patients: SugarRoll.be  Fact Sheet for Healthcare Providers: https://www.woods-mathews.com/  This test is not yet approved or cleared by the [place] FDA and  has been authorized for detection and/or diagnosis of SARS-CoV-2 by FDA under an Emergency Use Authorization (EUA). This EUA will remain  in effect (meaning this test can be used) for the duration of the COVID-19 declaration under Se ction 564(b)(1) of the Act, 21 U.S.C. section 360bbb-3(b)(1), unless the authorization is terminated or revoked sooner.  Performed at [facility], [place] [address] [place], [place] [address]   MRSA PCR Screening     Status: None   Collection Time: 05/09/20  6:29 PM   Specimen: Nasal Mucosa; Nasopharyngeal  Result Value Ref Range Status   MRSA by PCR NEGATIVE NEGATIVE Final    Comment:        The GeneXpert MRSA Assay (FDA approved for NASAL specimens only), is one component of a comprehensive MRSA colonization surveillance program. It is not intended to diagnose MRSA infection nor to guide  or monitor treatment for MRSA infections. Performed at [facility], [place] [address] [place], [place] [address]      Time coordinating discharge: 35 minutes  SIGNED:   Aline August, MD  [facility] 05/12/2020, 9:25 AM

## 2020-05-12 NOTE — Progress Notes (Signed)
SATURATION QUALIFICATIONS: (This note is used to comply with regulatory documentation for home oxygen)  Patient Saturations on Room Air at Rest = 94%  Patient Saturations on Room Air while Ambulating = 86%  Patient Saturations on 5 Liters of oxygen while Ambulating = 93%  Please briefly explain why patient needs home oxygen: Patient has new trach, requiring 5L for ambulation. Requires humidification at rest.

## 2020-05-12 NOTE — TOC Transition Note (Addendum)
Transition of Care [facility]) - CM/SW Discharge Note   Patient Details  Name: Alexander [name] MRN: [id number] Date of Birth: July 27, 1960  Transition of Care [facility] CM/SW Contact:  Carles Collet, RN Phone Number: 05/12/2020, 11:18 AM   Clinical Narrative:   [name] w patient at bedside. He states that he lives at home alone. He has a daughter who lives across the street, a daughter who lives 8 minutes away, and a son in town who will all be covering 24 hour supervision for him when he gets home.  Patient will need trach supplies (order obtained from MD), oxygen, humidity, suction, yaunkers, 14 Fr suction caths, trach cleaning kits for home. Patient verified address on file and he understands that suction and oxygen will need to be set up at the house before he can discharge. He will DC by car, verified w Dr Erin Fulling that he can travel home with unhumidified oxygen.   Patient states that he is to have training by RT today before discharge, and his children will be coming to the room to participate in trach care training. He states that he retired as an Public relations account executive in 2015 and in 2021 and is familiar with suctioning and some trach care.   [employer] will also send an RT, [name], to the home today to assess set up of equipment.   Discussed follow up appointment w him, he understands that he will need to call Monday to schedule an appointment w ENT, contact info is in AVS. I also included the contact number for the trach clinic.     Final next level of care: Home/Self Care Barriers to Discharge: Equipment Delay   Patient Goals and CMS Choice Patient states their goals for this hospitalization and ongoing recovery are:: to go home      Discharge Placement                       Discharge Plan and Services                DME Arranged: [name] supplies,Oxygen,Suction DME Agency: [employer] Date DME Agency Contacted: 05/12/20 Time DME Agency Contacted: 1117 Representative spoke with at DME Agency:  Darrall Dears Arranged: Respirator Therapy   Date [name]: 05/12/20 Time [name]: 1117 Representative spoke with at [place]: Thedore Mins ([employer] will send RT to patient's house after DC today to assess eqipment set up)  Social Determinants of Health (SDOH) Interventions     Readmission Risk Interventions No flowsheet data found.

## 2020-05-12 NOTE — Progress Notes (Signed)
Tracheostomy care completed, family at bedside, instructions given, family and patient have a good understanding of airway safety and management.  Extra supplies, suction catheters, extra trach, obturator, inner cannulas, H20 bottle, drain sponges, PMV packed up with family.  Pt and family understand about his oxygen flow and demand.  All questions answered, airway is secure.

## 2020-05-14 ENCOUNTER — Telehealth: Payer: Self-pay | Admitting: Radiation Oncology

## 2020-05-14 NOTE — Telephone Encounter (Signed)
The patient is aware of his upcoming appt w/Dr. Isidore Moos. While he had me on the phone, he asked about his trach supplies. I talked to Dr. [name] and was instructed that the [facility] team, at [facility], would/should tell him about his supplies. Our nurse, [name] called them and left them a message. The number she has is [phone].Anderson Malta called and had to leave a message. She has asked them to call her back. I'm really not sure how quickly they will be able to see the patient. Home Health may be helping with the supplies. She's looked at his discharge notes from 3/26. He was given extra supplies and the case manager appears to have contacted [place] to help with future supplies. He was also given the trach clinic's number.   I have also sent a message to Carles Collet, RN.

## 2020-05-15 ENCOUNTER — Ambulatory Visit: Admission: RE | Admit: 2020-05-15 | Payer: BC Managed Care – PPO | Source: Ambulatory Visit

## 2020-05-15 ENCOUNTER — Ambulatory Visit: Payer: BC Managed Care – PPO

## 2020-05-16 ENCOUNTER — Ambulatory Visit: Payer: BC Managed Care – PPO

## 2020-05-16 NOTE — Progress Notes (Signed)
Head and Neck Cancer Location of Tumor / Histology:  Squamous cell carcinoma of base of tongue, p16(+)  Patient presented with symptoms of: left neck swelling that had been present for several months. Went to ED on 04/30/2020 for worsening symptoms (increased swelling and some difficulty swallowing). Also has history of OSA but doesn't wear a CPAP currently.  CT Neck w/ Contrast --04/30/2020 IMPRESSION: --Large soft tissue mass centered in the region of the left tongue base also extending posteriorly to the glossotonsillar sulcus and vallecula. This portion of the mass has poorly defined margins but appears to cross midline and is estimated to measure at least 5.0 x 4.7 x 3.4 cm. The epiglottis and aryepiglottic folds are not well separately delineated and may be involved by the mass. Suspect tumor extension inferiorly at least to the level of the supraglottic larynx. There is also apparent tumor extension on the right into the strap muscles at the level of the supraglottic larynx. Also of note, there is an infiltrated appearance of the pre-epiglottic fat and a more subtly infiltrated appearance of the paraglottic fat concerning for tumor infiltration. Furthermore, there is asymmetric prominence of the left piriformis sinus suspicious for left vocal cord palsy. --ENT consultation is recommended  Biopsies revealed:  05/09/2020 FINAL MICROSCOPIC DIAGNOSIS:  A. TONGUE MASS, BASE, BIOPSY:  - Invasive squamous cell carcinoma.  COMMENT:  P16 will be ordered and reported in an addendum. ADDENDUM:  P16 is strongly positive  Nutrition Status Yes No Comments  Weight changes? [x]  []  Reports ~30 lb unintentional weight loss since being hospitalized  Swallowing concerns? [x]  []  States that ever since his biopsy he has struggled with solids and liquids. He also reports a depressed appetite  PEG? []  [x]     Referrals Yes No Comments  Social Work? [x]  []    Dentistry? [x]  []    Swallowing therapy? [x]  []     Nutrition? [x]  []    Med/Onc? []  [x]  No referral placed at this time   Safety Issues Yes No Comments  Prior radiation? []  [x]    Pacemaker/ICD? []  [x]    Possible current pregnancy? []  [x]  N/A  Is the patient on methotrexate? []  [x]     Tobacco/Marijuana/Snuff/ETOH use: Patient has never smoked or used smokeless tobacco, drinks alcohol rarely and denies any recreational drug use  Past/Anticipated interventions by otolaryngology, if any:  05/09/2020 Dr. Melony Overly --[place] (N/A) --DIRECT LARYNGOSCOPY --TRACHEOSTOMY Emergent  Past/Anticipated interventions by medical oncology, if any:  No referral placed at this time  Current Complaints / other details:   Patient has received both Pfizer vaccines as well as Moderna booster. Tentatively scheduled for video bronchoscopy and biopsy on 05/22/2020 by Dr. Leory Plowman Icard. PET scan scheduled for 05/28/2020

## 2020-05-17 ENCOUNTER — Encounter: Payer: Self-pay | Admitting: Pulmonary Disease

## 2020-05-17 ENCOUNTER — Telehealth (INDEPENDENT_AMBULATORY_CARE_PROVIDER_SITE_OTHER): Payer: Self-pay

## 2020-05-17 ENCOUNTER — Ambulatory Visit (INDEPENDENT_AMBULATORY_CARE_PROVIDER_SITE_OTHER): Payer: BC Managed Care – PPO | Admitting: Pulmonary Disease

## 2020-05-17 ENCOUNTER — Other Ambulatory Visit: Payer: Self-pay

## 2020-05-17 ENCOUNTER — Telehealth: Payer: Self-pay | Admitting: Pulmonary Disease

## 2020-05-17 VITALS — BP 116/78 | HR 102 | Temp 97.6°F | Ht 74.0 in | Wt 286.8 lb

## 2020-05-17 DIAGNOSIS — R590 Localized enlarged lymph nodes: Secondary | ICD-10-CM | POA: Diagnosis not present

## 2020-05-17 DIAGNOSIS — C109 Malignant neoplasm of oropharynx, unspecified: Secondary | ICD-10-CM | POA: Diagnosis not present

## 2020-05-17 DIAGNOSIS — C01 Malignant neoplasm of base of tongue: Secondary | ICD-10-CM | POA: Diagnosis present

## 2020-05-17 MED ORDER — ALBUTEROL SULFATE (2.5 MG/3ML) 0.083% IN NEBU: 2.5000 mg | INHALATION_SOLUTION | Freq: Four times a day (QID) | RESPIRATORY_TRACT | 12 refills | Status: AC | PRN

## 2020-05-17 NOTE — Progress Notes (Signed)
Synopsis: Referred in March 2022 for mediastinal adenopathy referred by Dr. Isidore Moos  Subjective:   PATIENT ID: Alexander [name] GENDER: male DOB: May 25, 1960, MRN: [id number]  Chief Complaint  Patient presents with  . Consult    Referral for abnormal chest CT.     This is a 60 year old gentleman, past medical history of GERD, sleep apnea, recent hospitalization following biopsy of mass at left tongue base.  Patient was found to have a left neck and left tongue base mass by ear nose and throat on 05/07/2020.  Patient was taken to the operating room by Dr. [name].  They were unable to successfully intubate the patient and the decision was made for need of tracheostomy during this procedure.  Pulmonary critical care was actually consulted for admission to the ICU following the surgery.  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  At discharge patient was scheduled for a nuclear medicine PET scan.  However this has been pushed out and rescheduled for a few weeks from now.  I was contacted by Dr. Isidore Moos from radiation oncology for consideration of biopsy of the mediastinum as there is an enlarged lymph node on his CT chest.  CT chest was completed on 05/11/2020 by Dr. Marin Olp.  There is subcarinal adenopathy concerning for potential metastatic disease.  Patient is seeing me today for discussion regarding video bronchoscopy endobronchial ultrasound and tissue sampling of the mediastinal node.     Past Medical History:  Diagnosis Date  . Asthma   . GERD (gastroesophageal reflux disease)   . Sleep apnea      History reviewed. No pertinent family history.   Past Surgical History:  Procedure Laterality Date  . DIRECT LARYNGOSCOPY  05/09/2020   Procedure: DIRECT LARYNGOSCOPY;  Surgeon: Rozetta Nunnery, MD;  Location: [facility];  Service: ENT;;  . MASS EXCISION N/A 05/09/2020   Procedure: EXCISION TONGUE MASS;  Surgeon: Rozetta Nunnery, MD;  Location: [facility];   Service: ENT;  Laterality: N/A;  . TONSILLECTOMY    . TRACHEOSTOMY TUBE PLACEMENT  05/09/2020   Procedure: TRACHEOSTOMY;  Surgeon: Rozetta Nunnery, MD;  Location: [facility];  Service: ENT;;    Social History   Socioeconomic History  . Marital status: Single    Spouse name: Not on file  . Number of children: Not on file  . Years of education: Not on file  . Highest education level: Not on file  Occupational History  . Not on file  Tobacco Use  . Smoking status: Never Smoker  . Smokeless tobacco: Never Used  Vaping Use  . Vaping Use: Never used  Substance and Sexual Activity  . Alcohol use: Not Currently    Comment: "4-5 drinks a year for special ocassions" 05/08/20  . Drug use: Never  . Sexual activity: Not on file  Other Topics Concern  . Not on file  Social History Narrative  . Not on file   Social Determinants of Health   Financial Resource Strain: Not on file  Food Insecurity: Not on file  Transportation Needs: Not on file  Physical Activity: Not on file  Stress: Not on file  Social Connections: Not on file  Intimate Partner Violence: Not on file     No Known Allergies   Outpatient Medications Prior to Visit  Medication Sig Dispense Refill  . cetirizine (ZYRTEC) 10 MG tablet Take 10 mg by mouth daily.    . fluticasone (FLONASE) 50 MCG/ACT nasal spray Place 1 spray into both  nostrils daily.    [name] omeprazole (PRILOSEC) 40 MG capsule Take 40 mg by mouth daily.    [name] albuterol (VENTOLIN HFA) 108 (90 Base) MCG/ACT inhaler Inhale 2 puffs into the lungs as needed for wheezing or shortness of breath. (Patient not taking: Reported on 05/17/2020)    . Ascorbic Acid (VITAMIN C PO) Take 1 tablet by mouth daily. (Patient not taking: Reported on 05/17/2020)    . bacitracin ointment Apply 1 application topically every 8 (eight) hours. Apply around trach wound (Patient not taking: Reported on 05/17/2020) 120 g 0  . Fluticasone-Salmeterol (ADVAIR) 100-50 MCG/DOSE AEPB Inhale 1 puff  into the lungs daily. (Patient not taking: Reported on 05/17/2020)    . Multiple Vitamins-Minerals (ZINC PO) Take 1 tablet by mouth daily. (Patient not taking: Reported on 05/17/2020)     No facility-administered medications prior to visit.    Review of Systems  Constitutional: Negative for chills, fever, malaise/fatigue and weight loss.  HENT: Negative for hearing loss, sore throat and tinnitus.   Eyes: Negative for blurred vision and double vision.  Respiratory: Positive for cough and shortness of breath. Negative for hemoptysis, sputum production, wheezing and stridor.   Cardiovascular: Negative for chest pain, palpitations, orthopnea, leg swelling and PND.  Gastrointestinal: Negative for abdominal pain, constipation, diarrhea, heartburn, nausea and vomiting.  Genitourinary: Negative for dysuria, hematuria and urgency.  Musculoskeletal: Negative for joint pain and myalgias.  Skin: Negative for itching and rash.  Neurological: Negative for dizziness, tingling, weakness and headaches.  Endo/Heme/Allergies: Negative for environmental allergies. Does not bruise/bleed easily.  Psychiatric/Behavioral: Negative for depression. The patient is not nervous/anxious and does not have insomnia.   All other systems reviewed and are negative.    Objective:  Physical Exam Vitals reviewed.  Constitutional:      General: He is not in acute distress.    Appearance: He is well-developed. He is obese.  HENT:     Head: Normocephalic and atraumatic.  Eyes:     General: No scleral icterus.    Conjunctiva/sclera: Conjunctivae normal.     Pupils: Pupils are equal, round, and reactive to light.  Neck:     Vascular: No JVD.     Trachea: No tracheal deviation.     Comments: Tracheostomy in place  Cardiovascular:     Rate and Rhythm: Normal rate and regular rhythm.     Heart sounds: Normal heart sounds. No murmur heard.   Pulmonary:     Effort: Pulmonary effort is normal. No tachypnea, accessory  muscle usage or respiratory distress.     Breath sounds: No stridor. No wheezing, rhonchi or rales.  Abdominal:     General: Bowel sounds are normal. There is no distension.     Palpations: Abdomen is soft.     Tenderness: There is no abdominal tenderness.  Musculoskeletal:        General: No tenderness.     Cervical back: Neck supple.  Lymphadenopathy:     Cervical: No cervical adenopathy.  Skin:    General: Skin is warm and dry.     Capillary Refill: Capillary refill takes less than 2 seconds.     Findings: No rash.  Neurological:     Mental Status: He is alert and oriented to person, place, and time.  Psychiatric:        Behavior: Behavior normal.      Vitals:   05/17/20 1350  BP: 116/78  Pulse: (!) 102  Temp: 97.6 F (36.4 C)  TempSrc: Temporal  SpO2: 96%  Weight: 286 lb 12.8 oz (130.1 kg)  Height: 6\' 2"  (1.88 m)   96% on RA BMI Readings from Last 3 Encounters:  05/17/20 36.82 kg/m  05/09/20 36.62 kg/m   Wt Readings from Last 3 Encounters:  05/17/20 286 lb 12.8 oz (130.1 kg)  05/09/20 293 lb (132.9 kg)     CBC    Component Value Date/Time   WBC 13.3 (H) 05/12/2020 0142   RBC 4.90 05/12/2020 0142   HGB 14.8 05/12/2020 0142   HCT 45.5 05/12/2020 0142   PLT 203 05/12/2020 0142   MCV 92.9 05/12/2020 0142   MCH 30.2 05/12/2020 0142   MCHC 32.5 05/12/2020 0142   RDW 14.3 05/12/2020 0142   LYMPHSABS 2.0 04/30/2020 0953   MONOABS 0.8 04/30/2020 0953   EOSABS 0.2 04/30/2020 0953   BASOSABS 0.1 04/30/2020 0953      Chest Imaging: 05/11/2020: CT chest no obvious pulmonary nodules however 2.3 cm subcarinal adenopathy. Concerning for metastatic disease of invasive squamous cell carcinoma from the head neck. The patient's images have been independently reviewed by me.    Pulmonary Functions Testing Results: No flowsheet data found.  FeNO:   Pathology:   05/09/2020 tongue left base biopsy: Invasive squamous cell carcinoma.  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)  C01   3. 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  Presented to the hospital with newfound oropharyngeal mass, biopsy was completed by ENT unable to maintain airway and urgent tracheostomy tube was placed.  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  He had CT scan of the chest with an enlarged subcarinal node at 2.3 cm concerning for metastatic disease.  Plan: Today in the office we discussed risk benefits and alternatives of proceeding with tissue biopsy and bronchoscopy . Patient needs to undergo video bronchoscopy with endobronchial ultrasound transbronchial needle aspirations of the station 7 lymph node.  This was a referral from radiation oncology.  He has yet to meet with Dr. Isidore Moos and has an appointment scheduled with her tomorrow.  We will tentatively schedule his biopsy on 05/22/2020.    Current Outpatient Medications:  .  cetirizine (ZYRTEC) 10 MG tablet, Take 10 mg by mouth daily., Disp: , Rfl:  .  fluticasone (FLONASE) 50 MCG/ACT nasal spray, Place 1 spray into both nostrils daily., Disp: , Rfl:  .  omeprazole (PRILOSEC) 40 MG capsule, Take 40 mg by mouth daily., Disp: , Rfl:  .  albuterol (VENTOLIN HFA) 108 (90 Base) MCG/ACT inhaler, Inhale 2 puffs into the lungs as needed for wheezing or shortness of breath. (Patient not taking: Reported on 05/17/2020), Disp: , Rfl:  .  Ascorbic Acid (VITAMIN C PO), Take 1 tablet by mouth daily. (Patient not taking: Reported on 05/17/2020), Disp: , Rfl:  .  bacitracin ointment, Apply 1 application topically every 8 (eight) hours. Apply around trach wound (Patient not taking: Reported on 05/17/2020), Disp: 120 g, Rfl: 0 .  Fluticasone-Salmeterol  (ADVAIR) 100-50 MCG/DOSE AEPB, Inhale 1 puff into the lungs daily. (Patient not taking: Reported on 05/17/2020), Disp: , Rfl:  .  Multiple Vitamins-Minerals (ZINC PO), Take 1 tablet by mouth daily. (Patient not taking: Reported on  05/17/2020), Disp: , Rfl:    I spent 61 minutes dedicated to the care of this patient on the date of this encounter to include pre-visit review of records, face-to-face time with the patient discussing conditions above, post visit ordering of testing, clinical documentation with the electronic health record, making appropriate referrals as documented, and communicating necessary findings to members of the patients care team.   Garner Nash, DO [place] 05/17/2020 2:18 PM

## 2020-05-17 NOTE — H&P (View-Only) (Signed)
Synopsis: Referred in March 2022 for mediastinal adenopathy referred by Dr. Isidore Moos  Subjective:   PATIENT ID: Alexander [name] GENDER: male DOB: 04-11-1960, MRN: [id number]  Chief Complaint  Patient presents with  . Consult    Referral for abnormal chest CT.     This is a 60 year old gentleman, past medical history of GERD, sleep apnea, recent hospitalization following biopsy of mass at left tongue base.  Patient was found to have a left neck and left tongue base mass by ear nose and throat on 05/07/2020.  Patient was taken to the operating room by Dr. [name].  They were unable to successfully intubate the patient and the decision was made for need of tracheostomy during this procedure.  Pulmonary critical care was actually consulted for admission to the ICU following the surgery.  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  At discharge patient was scheduled for a nuclear medicine PET scan.  However this has been pushed out and rescheduled for a few weeks from now.  I was contacted by Dr. Isidore Moos from radiation oncology for consideration of biopsy of the mediastinum as there is an enlarged lymph node on his CT chest.  CT chest was completed on 05/11/2020 by Dr. Marin Olp.  There is subcarinal adenopathy concerning for potential metastatic disease.  Patient is seeing me today for discussion regarding video bronchoscopy endobronchial ultrasound and tissue sampling of the mediastinal node.     Past Medical History:  Diagnosis Date  . Asthma   . GERD (gastroesophageal reflux disease)   . Sleep apnea      History reviewed. No pertinent family history.   Past Surgical History:  Procedure Laterality Date  . DIRECT LARYNGOSCOPY  05/09/2020   Procedure: DIRECT LARYNGOSCOPY;  Surgeon: Rozetta Nunnery, MD;  Location: [facility];  Service: ENT;;  . MASS EXCISION N/A 05/09/2020   Procedure: EXCISION TONGUE MASS;  Surgeon: Rozetta Nunnery, MD;  Location: [facility];   Service: ENT;  Laterality: N/A;  . TONSILLECTOMY    . TRACHEOSTOMY TUBE PLACEMENT  05/09/2020   Procedure: TRACHEOSTOMY;  Surgeon: Rozetta Nunnery, MD;  Location: [facility];  Service: ENT;;    Social History   Socioeconomic History  . Marital status: Single    Spouse name: Not on file  . Number of children: Not on file  . Years of education: Not on file  . Highest education level: Not on file  Occupational History  . Not on file  Tobacco Use  . Smoking status: Never Smoker  . Smokeless tobacco: Never Used  Vaping Use  . Vaping Use: Never used  Substance and Sexual Activity  . Alcohol use: Not Currently    Comment: "4-5 drinks a year for special ocassions" 05/08/20  . Drug use: Never  . Sexual activity: Not on file  Other Topics Concern  . Not on file  Social History Narrative  . Not on file   Social Determinants of Health   Financial Resource Strain: Not on file  Food Insecurity: Not on file  Transportation Needs: Not on file  Physical Activity: Not on file  Stress: Not on file  Social Connections: Not on file  Intimate Partner Violence: Not on file     No Known Allergies   Outpatient Medications Prior to Visit  Medication Sig Dispense Refill  . cetirizine (ZYRTEC) 10 MG tablet Take 10 mg by mouth daily.    . fluticasone (FLONASE) 50 MCG/ACT nasal spray Place 1 spray into both  nostrils daily.    [name] omeprazole (PRILOSEC) 40 MG capsule Take 40 mg by mouth daily.    [name] albuterol (VENTOLIN HFA) 108 (90 Base) MCG/ACT inhaler Inhale 2 puffs into the lungs as needed for wheezing or shortness of breath. (Patient not taking: Reported on 05/17/2020)    . Ascorbic Acid (VITAMIN C PO) Take 1 tablet by mouth daily. (Patient not taking: Reported on 05/17/2020)    . bacitracin ointment Apply 1 application topically every 8 (eight) hours. Apply around trach wound (Patient not taking: Reported on 05/17/2020) 120 g 0  . Fluticasone-Salmeterol (ADVAIR) 100-50 MCG/DOSE AEPB Inhale 1 puff  into the lungs daily. (Patient not taking: Reported on 05/17/2020)    . Multiple Vitamins-Minerals (ZINC PO) Take 1 tablet by mouth daily. (Patient not taking: Reported on 05/17/2020)     No facility-administered medications prior to visit.    Review of Systems  Constitutional: Negative for chills, fever, malaise/fatigue and weight loss.  HENT: Negative for hearing loss, sore throat and tinnitus.   Eyes: Negative for blurred vision and double vision.  Respiratory: Positive for cough and shortness of breath. Negative for hemoptysis, sputum production, wheezing and stridor.   Cardiovascular: Negative for chest pain, palpitations, orthopnea, leg swelling and PND.  Gastrointestinal: Negative for abdominal pain, constipation, diarrhea, heartburn, nausea and vomiting.  Genitourinary: Negative for dysuria, hematuria and urgency.  Musculoskeletal: Negative for joint pain and myalgias.  Skin: Negative for itching and rash.  Neurological: Negative for dizziness, tingling, weakness and headaches.  Endo/Heme/Allergies: Negative for environmental allergies. Does not bruise/bleed easily.  Psychiatric/Behavioral: Negative for depression. The patient is not nervous/anxious and does not have insomnia.   All other systems reviewed and are negative.    Objective:  Physical Exam Vitals reviewed.  Constitutional:      General: He is not in acute distress.    Appearance: He is well-developed. He is obese.  HENT:     Head: Normocephalic and atraumatic.  Eyes:     General: No scleral icterus.    Conjunctiva/sclera: Conjunctivae normal.     Pupils: Pupils are equal, round, and reactive to light.  Neck:     Vascular: No JVD.     Trachea: No tracheal deviation.     Comments: Tracheostomy in place  Cardiovascular:     Rate and Rhythm: Normal rate and regular rhythm.     Heart sounds: Normal heart sounds. No murmur heard.   Pulmonary:     Effort: Pulmonary effort is normal. No tachypnea, accessory  muscle usage or respiratory distress.     Breath sounds: No stridor. No wheezing, rhonchi or rales.  Abdominal:     General: Bowel sounds are normal. There is no distension.     Palpations: Abdomen is soft.     Tenderness: There is no abdominal tenderness.  Musculoskeletal:        General: No tenderness.     Cervical back: Neck supple.  Lymphadenopathy:     Cervical: No cervical adenopathy.  Skin:    General: Skin is warm and dry.     Capillary Refill: Capillary refill takes less than 2 seconds.     Findings: No rash.  Neurological:     Mental Status: He is alert and oriented to person, place, and time.  Psychiatric:        Behavior: Behavior normal.      Vitals:   05/17/20 1350  BP: 116/78  Pulse: (!) 102  Temp: 97.6 F (36.4 C)  TempSrc: Temporal  SpO2: 96%  Weight: 286 lb 12.8 oz (130.1 kg)  Height: 6\' 2"  (1.88 m)   96% on RA BMI Readings from Last 3 Encounters:  05/17/20 36.82 kg/m  05/09/20 36.62 kg/m   Wt Readings from Last 3 Encounters:  05/17/20 286 lb 12.8 oz (130.1 kg)  05/09/20 293 lb (132.9 kg)     CBC    Component Value Date/Time   WBC 13.3 (H) 05/12/2020 0142   RBC 4.90 05/12/2020 0142   HGB 14.8 05/12/2020 0142   HCT 45.5 05/12/2020 0142   PLT 203 05/12/2020 0142   MCV 92.9 05/12/2020 0142   MCH 30.2 05/12/2020 0142   MCHC 32.5 05/12/2020 0142   RDW 14.3 05/12/2020 0142   LYMPHSABS 2.0 04/30/2020 0953   MONOABS 0.8 04/30/2020 0953   EOSABS 0.2 04/30/2020 0953   BASOSABS 0.1 04/30/2020 0953      Chest Imaging: 05/11/2020: CT chest no obvious pulmonary nodules however 2.3 cm subcarinal adenopathy. Concerning for metastatic disease of invasive squamous cell carcinoma from the head neck. The patient's images have been independently reviewed by me.    Pulmonary Functions Testing Results: No flowsheet data found.  FeNO:   Pathology:   05/09/2020 tongue left base biopsy: Invasive squamous cell carcinoma.  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)  C01   3. 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  Presented to the hospital with newfound oropharyngeal mass, biopsy was completed by ENT unable to maintain airway and urgent tracheostomy tube was placed.  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  He had CT scan of the chest with an enlarged subcarinal node at 2.3 cm concerning for metastatic disease.  Plan: Today in the office we discussed risk benefits and alternatives of proceeding with tissue biopsy and bronchoscopy . Patient needs to undergo video bronchoscopy with endobronchial ultrasound transbronchial needle aspirations of the station 7 lymph node.  This was a referral from radiation oncology.  He has yet to meet with Dr. Isidore Moos and has an appointment scheduled with her tomorrow.  We will tentatively schedule his biopsy on 05/22/2020.    Current Outpatient Medications:  .  cetirizine (ZYRTEC) 10 MG tablet, Take 10 mg by mouth daily., Disp: , Rfl:  .  fluticasone (FLONASE) 50 MCG/ACT nasal spray, Place 1 spray into both nostrils daily., Disp: , Rfl:  .  omeprazole (PRILOSEC) 40 MG capsule, Take 40 mg by mouth daily., Disp: , Rfl:  .  albuterol (VENTOLIN HFA) 108 (90 Base) MCG/ACT inhaler, Inhale 2 puffs into the lungs as needed for wheezing or shortness of breath. (Patient not taking: Reported on 05/17/2020), Disp: , Rfl:  .  Ascorbic Acid (VITAMIN C PO), Take 1 tablet by mouth daily. (Patient not taking: Reported on 05/17/2020), Disp: , Rfl:  .  bacitracin ointment, Apply 1 application topically every 8 (eight) hours. Apply around trach wound (Patient not taking: Reported on 05/17/2020), Disp: 120 g, Rfl: 0 .  Fluticasone-Salmeterol  (ADVAIR) 100-50 MCG/DOSE AEPB, Inhale 1 puff into the lungs daily. (Patient not taking: Reported on 05/17/2020), Disp: , Rfl:  .  Multiple Vitamins-Minerals (ZINC PO), Take 1 tablet by mouth daily. (Patient not taking: Reported on  05/17/2020), Disp: , Rfl:    I spent 61 minutes dedicated to the care of this patient on the date of this encounter to include pre-visit review of records, face-to-face time with the patient discussing conditions above, post visit ordering of testing, clinical documentation with the electronic health record, making appropriate referrals as documented, and communicating necessary findings to members of the patients care team.   Garner Nash, DO [facility] 05/17/2020 2:18 PM

## 2020-05-17 NOTE — Telephone Encounter (Signed)
Alexander [name] called from the [facility] coverage for people that were there. She stated to me that she is sending me papers to be filled out for a PET order to be sent to [facility] for pt so he can have coverage on PET. I stated to her that pt ins. [employer] stated to me that he did not have out of state radiology coverage. I spoke w/pt on Tues. And told him I would be out yesterday and would contact him as soon ZI recalled insurance again to confirm coverage again. Waiting on fax from Alexander [name] at this point and will f/up ins. Again after clinic today.

## 2020-05-17 NOTE — Patient Instructions (Signed)
Thank you for visiting Dr. Valeta Harms at [facility]. Today we recommend the following:  Orders Placed This Encounter  Procedures  . Procedural/ Surgical Case Request: VIDEO BRONCHOSCOPY WITH ENDOBRONCHIAL ULTRASOUND  . Ambulatory referral to Pulmonology   Case scheduled for 05/22/2020 Nothing to eat or drink past midnight the night before.   Return in about 3 months (around 08/16/2020), or if symptoms worsen or fail to improve, for with APP or Dr. Valeta Harms.    Please do your part to reduce the spread of COVID-19.

## 2020-05-18 ENCOUNTER — Ambulatory Visit
Admission: RE | Admit: 2020-05-18 | Discharge: 2020-05-18 | Disposition: A | Discharge status: Home / Self Care | Payer: BC Managed Care – PPO | Source: Ambulatory Visit | Attending: Radiation Oncology | Admitting: Radiation Oncology

## 2020-05-18 ENCOUNTER — Other Ambulatory Visit (INDEPENDENT_AMBULATORY_CARE_PROVIDER_SITE_OTHER): Payer: Self-pay

## 2020-05-18 ENCOUNTER — Other Ambulatory Visit: Payer: Self-pay

## 2020-05-18 ENCOUNTER — Encounter: Payer: Self-pay | Admitting: Radiation Oncology

## 2020-05-18 ENCOUNTER — Telehealth (INDEPENDENT_AMBULATORY_CARE_PROVIDER_SITE_OTHER): Payer: Self-pay

## 2020-05-18 VITALS — BP 130/75 | HR 92 | Temp 98.8°F | Resp 19 | Ht 75.0 in | Wt 285.2 lb

## 2020-05-18 DIAGNOSIS — C01 Malignant neoplasm of base of tongue: Secondary | ICD-10-CM | POA: Diagnosis not present

## 2020-05-18 DIAGNOSIS — C109 Malignant neoplasm of oropharynx, unspecified: Secondary | ICD-10-CM

## 2020-05-18 MED ORDER — CEPHALEXIN 500 MG PO CAPS: 500.0000 mg | ORAL_CAPSULE | Freq: Three times a day (TID) | ORAL | 0 refills | Status: AC

## 2020-05-18 NOTE — Telephone Encounter (Signed)
After several calls redirected through pt [employer]. I got answers from [place] carrier. No P/A is needed for pt's PET scan. It is out of net work coverage and p twill have a $200.00 deductible then 100% coverage. Call Ref# [id number]. I spoke w/pt and his daughter Tandy Gaw about this matter and informed them I had received all the paper work from [facility] but they would only cover [place]. Burdette and his daughter stated to move ahead with [place] and our faculties for his care. He did not want to go to [facility]. I have e mailed the person to reschedule Lennette Bihari w.[facility] for PET and informed them to move forward.

## 2020-05-18 NOTE — Progress Notes (Signed)
Oncology Nurse Navigator Documentation  Met with patient during initial consult with Mr. Bazen. He was accompanied by his fiancee and daughter.  .  Introduced myself as his/their Navigator, explained my role as a member of the Care Team. . Provided New Patient Information packet: o Contact information for physician, this navigator, other members of the Care Team o Advance Directive information ([place] blue pamphlet with LCSW insert); provided [facility] AD booklet at his request,  o Fall Prevention Patient [place] sheet o Symptom Management Clinic information o [facility] campus map with highlight of [place] o SLP Information sheet . Provided and discussed educational handouts for PEG and PAC. [name] Assisted with post-consult appt scheduling. . Explained the location of Dr. Raynelle Dick office and [facility] as reference for future appts, including arrival procedure for these appts.   . I have called the trach clinic and set up an appointment for him on 4/7with COVID screening on 4/4. [name] Dr. Isidore Moos has placed an order for a MBSS and he is aware that he will be called with an appointment date/time.  . They verbalized understanding of information provided. . I encouraged them to call with questions/concerns moving forward.    Navigator Initial Assessment . Employment Status: he is retired . Currently on FMLA / STD: no . Living Situation: He lives with his fiancee.  . Support System: family and friends  . PCP: no . PCD: no . Financial Concerns:no . Transportation Needs: no . Sensory Deficits:no . Language Barriers/Interpreter Needed:  no . Ambulation Needs: no . DME Used in Home: no . Psychosocial Needs:  no . Concerns/Needs Understanding Cancer:  addressed/answered by navigator to best of ability . Self-Expressed Needs: no   [name] RN, BSN, OCN Head & Neck Oncology Nurse [place] at [facility] Phone # [phone]  Fax # [phone]

## 2020-05-18 NOTE — Telephone Encounter (Signed)
Placed in pt's chart Alexander [name]

## 2020-05-18 NOTE — Progress Notes (Signed)
Radiation Oncology         [phone] ________________________________  Outpatient follow-up  Name: Alexander [name] MRN: [id number]  Date: 05/18/2020  DOB: [birth year]  YO:VZCHYIF, No Pcp Per (Inactive)  Rozetta Nunnery, *   REFERRING PHYSICIAN: Melony Overly E, *  DIAGNOSIS:    ICD-10-CM   1. 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 male who presented to the ED on 04/30/2020 for evaluation of several month history of left-sided neck swelling.  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  He is here with his daughter and fianc today.  He recently moved to [facility]  The patient saw Dr. [name] on 05/03/2020, who performed a fiberoptic laryngoscopy through the right nostril. Nasopharyngeal was clear, but the base of the tongue was full on the left side and epiglottis was slightly pushed back posteriorly with questionable ulcer on the left base of the tongue extending down tot he left vallecular area.  Biopsy of the base of the tongue on 05/09/2020 revealed: invasive squamous cell carcinoma.  Strongly  p16 positive; emergent tracheostomy was also performed at that time.  He has been seen by my partner, Dr. Tammi Klippel.  The patient presents to see me as an outpatient to establish care given that I subspecialist and head and neck oncology and Dr. Tammi Klippel recommended that I assume his care.  The patient has seen Dr. Marin Olp of medical  oncology but he wishes to establish care at our outpatient [facility] office in the long-term.  Pertinent imaging thus far includes: (I have personally reviewed his imaging) 1. Soft tissue neck CT scan performed on 04/30/2020 that showed a large soft tissue mass centered in the region of thel eft tongue base that also extended posteriorly to the glossotonsillar sulcus and vallecula. That portion of the mass had poorly defined margins but appeared to cross the midline and was estimated to measure at least 5.0 x 4.7 x 3.4 cm. The epiglottis and aryeglottic folds were not well separately delineated and may have been involved by mass. Tumor extension was suspected inferiorly at least to the level of the supraglottic larynx. There was also apparent tumor extension on the right into the strap muscles at the level of the supraglottic larynx. Also of note, there was an infiltrated appearance of the pre-epiglottic fat and a more subtly infiltrated appearance of the paraglottic fat, concerning for tumor infiltration. Furthermore, there was asymmetric prominence of th left piriformis sinus, suspicious for left vocal cord palsy. Results were in favor of moderate/severe effacement of the oropharyngeal airway. Additionally, there was non-specific edema within the floor of the mouth/submandibular space as well as extensive bilateral cervical lymphadenopathy that was compatible with nodal metastatic disease. There was also concern for extracapsular extension. Finally, the mid left internal jugular vein was markedly effaced by lymphadenopathy, there was paranasal sinus disease noted, and the was a mild, age-indeterminate T4 superior endplate vertebral compression deformity.  2. Chest CT scan  on 05/11/2020 that showed subcarinal mass/adenopathy and low lung volumes with bibasilar atelectasis. However, there was no acute intrathoracic pathology.  Swallowing issues, if any: He states that ever since his biopsy he is struggled  with solids and liquids.  He has a depressed appetite  Weight Changes: Unintentional 30 pound weight loss  Tobacco history, if any: Denies  ETOH abuse, if any: Denies/rare drinker  He is vaccinated and boosted against COVID.  PREVIOUS RADIATION THERAPY: No  PAST MEDICAL HISTORY:  has a past medical history of Asthma, Cancer ([place]), GERD (gastroesophageal reflux disease), and Sleep apnea.    PAST SURGICAL HISTORY: Past Surgical History:  Procedure Laterality Date  . DIRECT LARYNGOSCOPY  05/09/2020   Procedure: DIRECT LARYNGOSCOPY;  Surgeon: Rozetta Nunnery, MD;  Location: [place];  Service: ENT;;  . MASS EXCISION N/A 05/09/2020   Procedure: EXCISION TONGUE MASS;  Surgeon: Rozetta Nunnery, MD;  Location: [place];  Service: ENT;  Laterality: N/A;  . TONSILLECTOMY    . TRACHEOSTOMY TUBE PLACEMENT  05/09/2020   Procedure: TRACHEOSTOMY;  Surgeon: Rozetta Nunnery, MD;  Location: [place];  Service: ENT;;    FAMILY HISTORY: family history is not on file.  SOCIAL HISTORY:  reports that he has never smoked. He has never used smokeless tobacco. He reports previous alcohol use. He reports that he does not use drugs.  ALLERGIES: Patient has no known allergies.  MEDICATIONS:  No current facility-administered medications for this encounter.   No current outpatient medications on file.   Facility-Administered Medications Ordered in Other Encounters  Medication Dose Route Frequency Provider Last Rate Last Admin  . lactated ringers infusion   Intravenous Continuous Annye Asa, MD 10 mL/hr at 05/22/20 0813 Restarted at 05/22/20 0815    REVIEW OF SYSTEMS:  Notable for that above.   PHYSICAL EXAM:  height is 6\' 3"  (1.905 m) and weight is 285 lb 4 oz (129.4 kg). His oral temperature is 98.8 F (37.1 C). His blood pressure is 130/75 and his pulse is 92. His respiration is 19 and oxygen saturation is 98%.   General: Alert and oriented, in no acute distress HEENT: Head is  normocephalic. Extraocular movements are intact. Oropharynx is notable for firmness in the posterior tongue with asymmetric elevation of posterior tongue.  No visible mucosal tumor in upper throat. Neck: Neck is notable for bilateral palpable adenopathy in the cervical neck, bulky, left greater than right.  Trach intact with copious mucus secretions. Heart: Regular in rate and rhythm with no murmurs, rubs, or gallops. Chest: Clear to auscultation bilaterally, with no rhonchi, wheezes, or rales. Abdomen: Soft, nontender, nondistended, with no rigidity or guarding. Lymphatics: see Neck Exam Skin: Notable for a wound at the inferior aspect of tracheostomy/plastic trach device appears to be digging into his tissues with moist desquamation and fluid discharge Musculoskeletal: symmetric strength and muscle tone throughout. Neurologic: Cranial nerves II through XII are grossly intact. No obvious focalities. Speech is fluent. Coordination is intact. Psychiatric: Judgment and insight are intact. Affect is appropriate.   ECOG = 1  0 - Asymptomatic (Fully active, able to carry on all predisease activities without restriction)  1 - Symptomatic but completely ambulatory (Restricted in physically strenuous activity but ambulatory and able to carry out work of a light or sedentary nature. For example, light housework, office work)  2 - Symptomatic, <50% in bed during the day (Ambulatory and capable of all self care but unable to carry out any work activities. Up and about more  than 50% of waking hours)  3 - Symptomatic, >50% in bed, but not bedbound (Capable of only limited self-care, confined to bed or chair 50% or more of waking hours)  4 - Bedbound (Completely disabled. Cannot carry on any self-care. Totally confined to bed or chair)  5 - Death   Eustace Pen MM, Creech RH, Tormey DC, et al. [phone]). "Toxicity and response criteria of the [facility]". [place] Oncol. 5 (6):  649-55   LABORATORY DATA:  Lab Results  Component Value Date   WBC 13.3 (H) 05/12/2020   HGB 14.8 05/12/2020   HCT 45.5 05/12/2020   MCV 92.9 05/12/2020   PLT 203 05/12/2020   CMP     Component Value Date/Time   NA 136 05/12/2020 0142   K 3.6 05/12/2020 0142   CL 100 05/12/2020 0142   CO2 29 05/12/2020 0142   GLUCOSE 118 (H) 05/12/2020 0142   BUN 14 05/12/2020 0142   CREATININE 1.08 05/12/2020 0142   CALCIUM 9.4 05/12/2020 0142   PROT 7.4 04/30/2020 0953   ALBUMIN 3.8 04/30/2020 0953   AST 23 04/30/2020 0953   ALT 24 04/30/2020 0953   ALKPHOS 52 04/30/2020 0953   BILITOT 1.1 04/30/2020 0953   GFRNONAA >60 05/12/2020 0142      No results found for: TSH   RADIOGRAPHY: CT Soft Tissue Neck W Contrast  Result Date: 04/30/2020 CLINICAL DATA:  Soft tissue mass, neck, no prior imaging. EXAM: CT NECK WITH CONTRAST TECHNIQUE: Multidetector CT imaging of the neck was performed using the standard protocol following the bolus administration of intravenous contrast. CONTRAST:  64mL OMNIPAQUE IOHEXOL 300 MG/ML  SOLN COMPARISON:  No pertinent prior exams available for comparison. FINDINGS: Pharynx and larynx: There is a large soft tissue mass centered in the region of the left tongue base which also extends posteriorly to the glossotonsillar sulcus and vallecula. The mass has poorly defined margins appears to cross midline and is estimated to measure at least 5.0 x 4.7 x 3.4 cm. The epiglottis and aryepiglottic folds are not well separately delineated and may be involved by the mass. Suspect tumor extension inferiorly at least to the level of the supraglottic larynx. Apparent tumor extension on the right into the strap muscles at the level of the supraglottic larynx (series 3, image 89). Infiltrated appearance of the pre epiglottic fat. There is a more subtly infiltrated appearance of the paraglottic fat. Additionally, there is asymmetric prominence of the left piriformis sinus suspicious for  left vocal cord palsy. There is moderate/severe effacement of the oropharyngeal airway. Nonspecific edema within the floor of mouth/submandibular space. Salivary glands: No inflammation, mass, or stone. Thyroid: Unremarkable. Lymph nodes: Extensive bilateral cervical lymphadenopathy compatible with nodal metastatic disease. Some of the lymph nodes demonstrate abnormal internal low-density. Most notably, there is a bulky node/nodal conglomerate at the left level 2/3 stations measuring 6.2 x 3.4 cm (series 5, image 81). This lymph node has poorly defined margins concerning for extracapsular extension. Additional lymphadenopathy at the left level 2, 3 and 5 stations. An enlarged right level 2 lymph node measures 4.9 x 2.5 cm in transaxial dimensions. Additional lymphadenopathy at the right 2 and 3 stations. For instance, an enlarged right level 3 lymph node measures 1.7 cm in short axis. Vascular: The major vascular structures of the neck are patent. The mid left internal jugular vein is markedly narrowed by left level 2/3 lymphadenopathy. Limited intracranial: No acute intracranial abnormality is identified. Visualized orbits: No mass or  acute finding. Mastoids and visualized paranasal sinuses: Mild bilateral frontal sinus mucosal thickening. Extensive partial opacification of the bilateral ethmoid air cells. Mild right maxillary sinus mucosal thickening. Moderate left maxillary sinus mucosal thickening greatest inferiorly. Skeleton: Mild age-indeterminate C4 superior endplate vertebral compression deformity. Elsewhere, there is no acute bony abnormality or aggressive osseous lesion. Upper chest: No consolidation within the imaged lung apices. These results were called by telephone at the time of interpretation on 04/30/2020 at 2:38 pm to provider [employer] RAY , who verbally acknowledged these results. IMPRESSION: Large soft tissue mass centered in the region of the left tongue base also extending posteriorly to the  glossotonsillar sulcus and vallecula. This portion of the mass has poorly defined margins but appears to cross midline and is estimated to measure at least 5.0 x 4.7 x 3.4 cm. The epiglottis and aryepiglottic folds are not well separately delineated and may be involved by the mass. Suspect tumor extension inferiorly at least to the level of the supraglottic larynx. There is also apparent tumor extension on the right into the strap muscles at the level of the supraglottic larynx. Also of note, there is an infiltrated appearance of the pre-epiglottic fat and a more subtly infiltrated appearance of the paraglottic fat concerning for tumor infiltration. Furthermore, there is asymmetric prominence of the left piriformis sinus suspicious for left vocal cord palsy. ENT consultation is recommended. Results in moderate/severe effacement of the oropharyngeal airway. Nonspecific edema within the floor of mouth/submandibular space. 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 There is concern for extracapsular extension, as described. The mid left internal jugular vein is markedly effaced by lymphadenopathy. Paranasal sinus disease as described. Mild age-indeterminate T4 superior endplate vertebral compression deformity. Electronically Signed   By: Kellie Simmering DO   On: 04/30/2020 14:38   CT CHEST W CONTRAST  Result Date: 05/11/2020 CLINICAL DATA:  60 year old male with left tongue base mass. EXAM: CT CHEST WITH CONTRAST TECHNIQUE: Multidetector CT imaging of the chest was performed during intravenous contrast administration. CONTRAST:  42mL OMNIPAQUE IOHEXOL 300 MG/ML  SOLN COMPARISON:  Chest radiograph dated 05/10/2020 FINDINGS: Cardiovascular: There is no cardiomegaly or pericardial effusion. The thoracic aorta is unremarkable. The origins of the great vessels of the aortic arch appear patent as visualized. The central pulmonary arteries appear patent for the degree of  opacification. Mediastinum/Nodes: Subcarinal mass/adenopathy measures 2.3 cm in short axis. No hilar adenopathy. The esophagus is grossly unremarkable. No mediastinal fluid collection. Small amount of fluid and air in the anterior mediastinum likely secondary to tracheostomy. Lungs/Pleura: There is shallow inspiration with bibasilar atelectasis. No pleural effusion or pneumothorax. The central airways are patent. A tracheostomy is noted with tip approximately 4.5 cm above the carina. Upper Abdomen: Probable fatty liver. Musculoskeletal: Degenerative changes of the spine. No acute osseous pathology. IMPRESSION: 1. No acute intrathoracic pathology. 2. Subcarinal mass/adenopathy. 3. Low lung volumes with bibasilar atelectasis. Electronically Signed   By: Anner Crete M.D.   On: 05/11/2020 02:06   DG CHEST PORT 1 VIEW  Result Date: 05/10/2020 CLINICAL DATA:  Tracheostomy. EXAM: PORTABLE CHEST 1 VIEW COMPARISON:  No prior. FINDINGS: Tracheostomy noted in stable position. Heart size normal. Low lung volumes with mild bibasilar atelectasis. Questionable tiny nodule left upper lung. PA lateral chest x-ray suggested for further evaluation. Tiny bilateral pleural effusions cannot be excluded. No pneumothorax. IMPRESSION: 1. Tracheostomy tube in stable position. 2. Low lung volumes with mild bibasilar atelectasis. Tiny bilateral pleural effusions cannot be excluded. 3. Questionable tiny  pulmonary nodule left upper lung. PA lateral chest x-ray suggested for further evaluation. Electronically Signed   By: Marcello Moores  Register   On: 05/10/2020 08:16      IMPRESSION/PLAN: Tongue cancer  This is a delightful patient with head and neck cancer.  He has gone through so much already and I let the patient and his family know that I am so sorry that he is suffering from such a terrible and aggressive disease.  He understands his disease is life-threatening and serious, and his final staging is not yet complete.  He wants to be  aggressive in his treatment.  He has served our Chartered loss adjuster and we will do everything we can to fight for him and help him heal. He and his family understand that more information is needed before his care plan can be optimized.  PET scan is pending.  He also has a subcarinal mass of unclear etiology.  I have spoken with pulmonology, Dr. Valeta Harms, about his case to expedite bronchoscopy and biopsy of the subcarinal mass.  The patient has family understand that if he has evidence of distant metastatic disease that it may end up being in his best interest to start induction chemotherapy alone and radiation would be held indefinitely.  Alternatively, if his disease is confined to the head and neck region we may start with chemoradiation concurrently.  Anderson Malta, our navigator, was present for this appointment.  She will transition medical oncology care to Dr. Chryl Heck at the patient's request so that care can be given close to home.  We discussed the potential risks, benefits, and side effects of radiotherapy to the head and neck region in case it is conducted. We talked in detail about acute and late effects. We discussed that some of the most bothersome acute effects may be mucositis, dysgeusia, salivary changes, skin irritation, hair loss, dehydration, weight loss and fatigue. We talked about late effects which include but are not necessarily limited to dysphagia, hypothyroidism, nerve injury, vascular injury, spinal cord injury, xerostomia, trismus, neck edema, and potential injury to any of the tissues in the head and neck region. No guarantees of treatment were given. A consent form was signed and placed in the patient's medical record. The patient is enthusiastic about proceeding with treatment. I look forward to participating in the patient's care as warranted.    We also discussed that the treatment of head and neck cancer is a multidisciplinary process to maximize treatment outcomes and quality of  life. For this reason the following referrals have been or will be made:   Medical oncology to discuss chemotherapy    Dentistry for dental evaluation, possible extractions in the radiation fields, and /or advice on reducing risk of cavities, osteoradionecrosis, or other oral issues.   Nutritionist for nutrition support during and after treatment.   Speech language pathology for swallowing and/or speech therapy.  MBSS due to dysphagia   Social work for social support.    Physical therapy due to risk of lymphedema in neck and deconditioning.   Baseline labs including TSH.  Referral to trach clinic given wound around tracheostomy and copious secretions.    On date of service, in total, I spent 60 minutes on this encounter. Patient was seen in person.  __________________________________________   Eppie Gibson, MD  This document serves as a record of services personally performed by Eppie Gibson, MD. It was created on his behalf by Clerance Lav, a trained medical scribe. The creation of this record is based on  the scribe's personal observations and the provider's statements to them. This document has been checked and approved by the attending provider.

## 2020-05-21 ENCOUNTER — Other Ambulatory Visit (HOSPITAL_COMMUNITY): Payer: PRIVATE HEALTH INSURANCE

## 2020-05-21 ENCOUNTER — Other Ambulatory Visit (HOSPITAL_COMMUNITY)
Admission: RE | Admit: 2020-05-21 | Discharge: 2020-05-21 | Disposition: A | Discharge status: Home / Self Care | Payer: BC Managed Care – PPO | Source: Ambulatory Visit | Attending: Pulmonary Disease | Admitting: Pulmonary Disease

## 2020-05-21 ENCOUNTER — Ambulatory Visit (INDEPENDENT_AMBULATORY_CARE_PROVIDER_SITE_OTHER): Payer: PRIVATE HEALTH INSURANCE | Admitting: Otolaryngology

## 2020-05-21 ENCOUNTER — Telehealth: Payer: Self-pay | Admitting: Hematology and Oncology

## 2020-05-21 ENCOUNTER — Other Ambulatory Visit: Payer: Self-pay

## 2020-05-21 ENCOUNTER — Encounter (HOSPITAL_COMMUNITY): Payer: Self-pay | Admitting: Pulmonary Disease

## 2020-05-21 VITALS — Temp 97.3°F

## 2020-05-21 DIAGNOSIS — C01 Malignant neoplasm of base of tongue: Secondary | ICD-10-CM | POA: Diagnosis present

## 2020-05-21 DIAGNOSIS — G4730 Sleep apnea, unspecified: Secondary | ICD-10-CM | POA: Diagnosis not present

## 2020-05-21 DIAGNOSIS — J45909 Unspecified asthma, uncomplicated: Secondary | ICD-10-CM | POA: Diagnosis not present

## 2020-05-21 DIAGNOSIS — Z79899 Other long term (current) drug therapy: Secondary | ICD-10-CM | POA: Diagnosis not present

## 2020-05-21 DIAGNOSIS — Z01812 Encounter for preprocedural laboratory examination: Secondary | ICD-10-CM | POA: Insufficient documentation

## 2020-05-21 DIAGNOSIS — Z4889 Encounter for other specified surgical aftercare: Secondary | ICD-10-CM

## 2020-05-21 DIAGNOSIS — Z20822 Contact with and (suspected) exposure to covid-19: Secondary | ICD-10-CM | POA: Insufficient documentation

## 2020-05-21 DIAGNOSIS — C109 Malignant neoplasm of oropharynx, unspecified: Secondary | ICD-10-CM | POA: Diagnosis not present

## 2020-05-21 DIAGNOSIS — Z7951 Long term (current) use of inhaled steroids: Secondary | ICD-10-CM | POA: Diagnosis not present

## 2020-05-21 DIAGNOSIS — K219 Gastro-esophageal reflux disease without esophagitis: Secondary | ICD-10-CM | POA: Diagnosis not present

## 2020-05-21 NOTE — Progress Notes (Signed)
HPI: Alexander [name] is a 60 y.o. male who presents 12 days s/p emergent tracheostomy with direct laryngoscopy and biopsy of base of tongue cancer.  He has had no airway problems.  Presents today to have the tracheostomy wound checked.  It has been doing better the past few days..   Past Medical History:  Diagnosis Date  . Asthma   . GERD (gastroesophageal reflux disease)   . Sleep apnea    Past Surgical History:  Procedure Laterality Date  . DIRECT LARYNGOSCOPY  05/09/2020   Procedure: DIRECT LARYNGOSCOPY;  Surgeon: Rozetta Nunnery, MD;  Location: [place];  Service: ENT;;  . MASS EXCISION N/A 05/09/2020   Procedure: EXCISION TONGUE MASS;  Surgeon: Rozetta Nunnery, MD;  Location: [place];  Service: ENT;  Laterality: N/A;  . TONSILLECTOMY    . TRACHEOSTOMY TUBE PLACEMENT  05/09/2020   Procedure: TRACHEOSTOMY;  Surgeon: Rozetta Nunnery, MD;  Location: [place];  Service: ENT;;   Social History   Socioeconomic History  . Marital status: Single    Spouse name: Not on file  . Number of children: Not on file  . Years of education: Not on file  . Highest education level: Not on file  Occupational History  . Not on file  Tobacco Use  . Smoking status: Never Smoker  . Smokeless tobacco: Never Used  Vaping Use  . Vaping Use: Never used  Substance and Sexual Activity  . Alcohol use: Not Currently    Comment: "4-5 drinks a year for special ocassions" 05/08/20  . Drug use: Never  . Sexual activity: Not Currently  Other Topics Concern  . Not on file  Social History Narrative  . Not on file   Social Determinants of Health   Financial Resource Strain: Not on file  Food Insecurity: Not on file  Transportation Needs: Not on file  Physical Activity: Not on file  Stress: Not on file  Social Connections: Not on file   No family history on file. No Known Allergies Prior to Admission medications   Medication Sig Start Date End Date Taking? Authorizing Provider  albuterol  (PROVENTIL) (2.5 MG/3ML) 0.083% nebulizer solution Take 3 mLs (2.5 mg total) by nebulization every 6 (six) hours as needed for wheezing or shortness of breath. Patient not taking: Reported on 05/18/2020 05/17/20   June Leap L, DO  albuterol (VENTOLIN HFA) 108 (90 Base) MCG/ACT inhaler Inhale 2 puffs into the lungs as needed for wheezing or shortness of breath. Patient not taking: Reported on 05/18/2020    [provider]  Ascorbic Acid (VITAMIN C PO) Take 1 tablet by mouth daily. Patient not taking: Reported on 05/18/2020    [provider]  bacitracin ointment Apply 1 application topically every 8 (eight) hours. Apply around trach wound Patient not taking: Reported on 05/18/2020 05/12/20   Aline August, MD  cephALEXin (KEFLEX) 500 MG capsule Take 1 capsule (500 mg total) by mouth 3 (three) times daily for 5 days. 05/18/20 05/23/20  Rozetta Nunnery, MD  cetirizine (ZYRTEC) 10 MG tablet Take 10 mg by mouth daily.    [provider]  fluticasone (FLONASE) 50 MCG/ACT nasal spray Place 1 spray into both nostrils daily.    [provider]  Fluticasone-Salmeterol (ADVAIR) 100-50 MCG/DOSE AEPB Inhale 1 puff into the lungs daily. Patient not taking: Reported on 05/18/2020    [provider]  Multiple Vitamins-Minerals (ZINC PO) Take 1 tablet by mouth daily. Patient not taking: Reported on 05/18/2020  [provider]  omeprazole (PRILOSEC) 40 MG capsule Take 40 mg by mouth daily.    [provider]     Physical Exam: Sutures are still intact and these were removed in the office today.  He has an open wound inferior to the tracheostomy from the emergent tracheostomy performed in the OR.  This is healing adequately with no signs of surrounding cellulitis or infection.  He has moderate granulation tissue.  The lower bevel of the tracheostomy is digging slightly into the wound.  The sutures were removed in the office today and reapplied the  tracheostomy spine so it lied inferior to the bore portion of the tracheostomy on.   Assessment: S/p emergent tracheostomy with direct laryngoscopy and biopsy  Plan: He apparently is having a procedure done tomorrow.  He will follow-up on Friday for possible change of the tracheostomy from a cuffed tube to a noncuffed tracheostomy tube.   Radene Journey, MD

## 2020-05-21 NOTE — Telephone Encounter (Signed)
Scheduled apt per 4/1 inbasket msg. Called pt, no answer. Left msg with appt date and time.

## 2020-05-21 NOTE — Telephone Encounter (Signed)
Scheduled appt per 4/4 inbasket msg. Pt aware.

## 2020-05-21 NOTE — Progress Notes (Signed)
Alexander [name] denies chest pain or shortness of breath. Patient was tested for Covid and has been in quarantine since that time.

## 2020-05-21 NOTE — Anesthesia Preprocedure Evaluation (Addendum)
Anesthesia Evaluation  Patient identified by MRN, date of birth, ID band Patient awake    Reviewed: Allergy & Precautions, NPO status , Patient's Chart, lab work & pertinent test results  Airway Mallampati: Trach  TM Distance: >3 FB Neck ROM: Full  Mouth opening: Limited Mouth Opening Comment: Limited mouth opening 2/2 neck swelling L side. Pt states that the limited mouth opening is not secondary to pain, but rather he states he simply cannot open further Neck extension relatively normal but left angle of mandible obscured by swelling Dental  (+)    Pulmonary sleep apnea (no CPAP for years) ,    Pulmonary exam normal breath sounds clear to auscultation       Cardiovascular negative cardio ROS Normal cardiovascular exam Rhythm:Regular Rate:Normal     Neuro/Psych negative neurological ROS  negative psych ROS   GI/Hepatic Neg liver ROS, GERD  Medicated and Controlled,  Endo/Other  Obesity BMI 37  Renal/GU negative Renal ROS  negative genitourinary   Musculoskeletal negative musculoskeletal ROS (+)   Abdominal (+) + obese,   Peds  Hematology negative hematology ROS (+)   Anesthesia Other Findings IMPRESSION: Large soft tissue mass centered in the region of the left tongue base also extending posteriorly to the glossotonsillar sulcus and vallecula. This portion of the mass has poorly defined margins but appears to cross midline and is estimated to measure at least 5.0 x 4.7 x 3.4 cm. The epiglottis and aryepiglottic folds are not well separately delineated and may be involved by the mass. Suspect tumor extension inferiorly at least to the level of the supraglottic larynx. There is also apparent tumor extension on the right into the strap muscles at the level of the supraglottic larynx. Also of note, there is an infiltrated appearance of the pre-epiglottic fat and a more subtly infiltrated appearance of the paraglottic  fat concerning for tumor infiltration. Furthermore, there is asymmetric prominence of the left piriformis sinus suspicious for left vocal cord palsy. ENT consultation is recommended.  Results in moderate/severe effacement of the oropharyngeal airway.  Nonspecific edema within the floor of mouth/submandibular space.  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 There is concern for extracapsular extension, as described.  The mid left internal jugular vein is markedly effaced by lymphadenopathy.  Reproductive/Obstetrics negative OB ROS                            Anesthesia Physical  Anesthesia Plan  ASA: IV  Anesthesia Plan: General   Post-op Pain Management:    Induction: Inhalational and Intravenous  PONV Risk Score and Plan: 3 and Ondansetron, Dexamethasone, Midazolam and Treatment may vary due to age or medical condition  Airway Management Planned: Tracheostomy  Additional Equipment: None  Intra-op Plan:   Post-operative Plan: Possible Post-op intubation/ventilation and Extubation in OR  Informed Consent: I have reviewed the patients History and Physical, chart, labs and discussed the procedure including the risks, benefits and alternatives for the proposed anesthesia with the patient or authorized representative who has indicated his/her understanding and acceptance.     Dental advisory given  Plan Discussed with: CRNA and Anesthesiologist  Anesthesia Plan Comments:         Anesthesia Quick Evaluation

## 2020-05-22 ENCOUNTER — Encounter: Payer: Self-pay | Admitting: Radiation Oncology

## 2020-05-22 ENCOUNTER — Other Ambulatory Visit: Payer: Self-pay

## 2020-05-22 ENCOUNTER — Encounter (HOSPITAL_COMMUNITY): Payer: Self-pay | Admitting: Pulmonary Disease

## 2020-05-22 ENCOUNTER — Ambulatory Visit (HOSPITAL_COMMUNITY)
Admission: RE | Admit: 2020-05-22 | Discharge: 2020-05-22 | Disposition: A | Discharge status: Home / Self Care | Payer: BC Managed Care – PPO | Attending: Pulmonary Disease | Admitting: Pulmonary Disease

## 2020-05-22 ENCOUNTER — Ambulatory Visit (HOSPITAL_COMMUNITY): Payer: BC Managed Care – PPO | Admitting: Anesthesiology

## 2020-05-22 ENCOUNTER — Encounter (HOSPITAL_COMMUNITY)
Admission: RE | Disposition: A | Discharge status: Home / Self Care | Payer: Self-pay | Source: Home / Self Care | Attending: Pulmonary Disease

## 2020-05-22 DIAGNOSIS — R590 Localized enlarged lymph nodes: Secondary | ICD-10-CM | POA: Diagnosis present

## 2020-05-22 DIAGNOSIS — C01 Malignant neoplasm of base of tongue: Secondary | ICD-10-CM | POA: Diagnosis not present

## 2020-05-22 DIAGNOSIS — J45909 Unspecified asthma, uncomplicated: Secondary | ICD-10-CM | POA: Insufficient documentation

## 2020-05-22 DIAGNOSIS — C109 Malignant neoplasm of oropharynx, unspecified: Secondary | ICD-10-CM | POA: Insufficient documentation

## 2020-05-22 DIAGNOSIS — Z7951 Long term (current) use of inhaled steroids: Secondary | ICD-10-CM | POA: Insufficient documentation

## 2020-05-22 DIAGNOSIS — Z20822 Contact with and (suspected) exposure to covid-19: Secondary | ICD-10-CM | POA: Insufficient documentation

## 2020-05-22 DIAGNOSIS — Z79899 Other long term (current) drug therapy: Secondary | ICD-10-CM | POA: Insufficient documentation

## 2020-05-22 DIAGNOSIS — K219 Gastro-esophageal reflux disease without esophagitis: Secondary | ICD-10-CM | POA: Insufficient documentation

## 2020-05-22 DIAGNOSIS — R5383 Other fatigue: Secondary | ICD-10-CM

## 2020-05-22 DIAGNOSIS — G4730 Sleep apnea, unspecified: Secondary | ICD-10-CM | POA: Insufficient documentation

## 2020-05-22 DIAGNOSIS — R5381 Other malaise: Secondary | ICD-10-CM

## 2020-05-22 HISTORY — PX: BRONCHIAL NEEDLE ASPIRATION BIOPSY: SHX5106

## 2020-05-22 HISTORY — PX: VIDEO BRONCHOSCOPY WITH ENDOBRONCHIAL ULTRASOUND: SHX6177

## 2020-05-22 HISTORY — DX: Malignant (primary) neoplasm, unspecified: C80.1

## 2020-05-22 LAB — SARS CORONAVIRUS 2 (TAT 6-24 HRS): SARS Coronavirus 2: NEGATIVE

## 2020-05-22 SURGERY — BRONCHOSCOPY, WITH EBUS
Anesthesia: General

## 2020-05-22 MED ORDER — DEXAMETHASONE SODIUM PHOSPHATE 10 MG/ML IJ SOLN
INTRAMUSCULAR | Status: DC | PRN
  Administered 2020-05-22: 10 mg via INTRAVENOUS

## 2020-05-22 MED ORDER — CHLORHEXIDINE GLUCONATE 0.12 % MT SOLN
OROMUCOSAL | Status: AC
  Administered 2020-05-22: 15 mL
  Filled 2020-05-22: qty 15

## 2020-05-22 MED ORDER — PROPOFOL 10 MG/ML IV BOLUS
INTRAVENOUS | Status: DC | PRN
  Administered 2020-05-22: 50 mg via INTRAVENOUS

## 2020-05-22 MED ORDER — ONDANSETRON HCL 4 MG/2ML IJ SOLN
INTRAMUSCULAR | Status: DC | PRN
  Administered 2020-05-22: 4 mg via INTRAVENOUS

## 2020-05-22 MED ORDER — LACTATED RINGERS IV SOLN: INTRAVENOUS | Status: DC

## 2020-05-22 SURGICAL SUPPLY — 29 items

## 2020-05-22 NOTE — Transfer of Care (Signed)
Immediate Anesthesia Transfer of Care Note  Patient: Alexander [name]  Procedure(s) Performed: VIDEO BRONCHOSCOPY WITH ENDOBRONCHIAL ULTRASOUND (N/A ) BRONCHIAL NEEDLE ASPIRATION BIOPSIES  Patient Location: PACU  Anesthesia Type:General  Level of Consciousness: awake and patient cooperative  Airway & Oxygen Therapy: Patient Spontanous Breathing and Patient connected to tracheostomy mask oxygen  Post-op Assessment: Report given to RN and Post -op Vital signs reviewed and stable  Post vital signs: Reviewed and stable  Last Vitals:  Vitals Value Taken Time  BP 121/85 05/22/20 0858  Temp    Pulse 84 05/22/20 0902  Resp 26 05/22/20 0902  SpO2 96 % 05/22/20 0902  Vitals shown include unvalidated device data.  Last Pain:  Vitals:   05/22/20 0725  TempSrc:   PainSc: 0-No pain      Patients Stated Pain Goal: 3 (27/78/24 2353)  Complications: No complications documented.

## 2020-05-22 NOTE — Interval H&P Note (Signed)
History and Physical Interval Note:  05/22/2020 8:10 AM  Alexander [name]  has presented today for surgery, with the diagnosis of adenopathy, squamous cell carcinoma.  The various methods of treatment have been discussed with the patient and family. After consideration of risks, benefits and other options for treatment, the patient has consented to  Procedure(s): [place] (N/A) as a surgical intervention.  The patient's history has been reviewed, patient examined, no change in status, stable for surgery.  I have reviewed the patient's chart and labs.  Questions were answered to the patient's satisfaction.     [name]

## 2020-05-22 NOTE — Anesthesia Postprocedure Evaluation (Signed)
Anesthesia Post Note  Patient: Alexander [name]  Procedure(s) Performed: VIDEO BRONCHOSCOPY WITH ENDOBRONCHIAL ULTRASOUND (N/A ) BRONCHIAL NEEDLE ASPIRATION BIOPSIES     Patient location during evaluation: PACU Anesthesia Type: General Level of consciousness: awake and alert Pain management: pain level controlled Vital Signs Assessment: post-procedure vital signs reviewed and stable Respiratory status: spontaneous breathing, nonlabored ventilation, respiratory function stable and patient connected to nasal cannula oxygen Cardiovascular status: blood pressure returned to baseline and stable Postop Assessment: no apparent nausea or vomiting Anesthetic complications: no   No complications documented.  Last Vitals:  Vitals:   05/22/20 0930 05/22/20 0945  BP: 115/82 115/68  Pulse: 81 78  Resp: 15 16  Temp:  36.7 C  SpO2: 100% 92%    Last Pain:  Vitals:   05/22/20 0930  TempSrc:   PainSc: 0-No pain                 Kiandria Clum

## 2020-05-22 NOTE — Discharge Instructions (Signed)
Flexible Bronchoscopy, Care After This sheet gives you information about how to care for yourself after your test. Your doctor may also give you more specific instructions. If you have problems or questions, contact your doctor. Follow these instructions at home: Eating and drinking  Do not eat or drink anything (not even water) for 2 hours after your test, or until your numbing medicine (local anesthetic) wears off.  When your numbness is gone and your cough and gag reflexes have come back, you may: ? Eat only soft foods. ? Slowly drink liquids.  The day after the test, go back to your normal diet. Driving  Do not drive for 24 hours if you were given a medicine to help you relax (sedative).  Do not drive or use heavy machinery while taking prescription pain medicine. General instructions   Take over-the-counter and prescription medicines only as told by your doctor.  Return to your normal activities as told. Ask what activities are safe for you.  Do not use any products that have nicotine or tobacco in them. This includes cigarettes and e-cigarettes. If you need help quitting, ask your doctor.  Keep all follow-up visits as told by your doctor. This is important. It is very important if you had a tissue sample (biopsy) taken. Get help right away if:  You have shortness of breath that gets worse.  You get light-headed.  You feel like you are going to pass out (faint).  You have chest pain.  You cough up: ? More than a little blood. ? More blood than before. Summary  Do not eat or drink anything (not even water) for 2 hours after your test, or until your numbing medicine wears off.  Do not use cigarettes. Do not use e-cigarettes.  Get help right away if you have chest pain.   This information is not intended to replace advice given to you by your health care provider. Make sure you discuss any questions you have with your health care provider. Document Released:  12/01/2008 Document Revised: 01/16/2017 Document Reviewed: 02/22/2016 Elsevier Patient Education  2020 [employer].

## 2020-05-22 NOTE — Progress Notes (Signed)
Dental Form with Estimates of Radiation Dose      Diagnosis:    ICD-10-CM   1. 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  We still do not have pet imaging.  Anticipate that right tooth roots will be spared but the 50 Gray line refers to his LEFT sided tooth roots.  He is pending biopsy results of chest and PET scan.  Unfortunately there has been a delay in getting pet imaging.  Want to have him ready for radiation therapy if it is ultimately indicated.  Thanks Please contact Eppie Gibson, MD, with patient's disposition after evaluation and/or dental treatment.

## 2020-05-22 NOTE — Op Note (Signed)
Video Bronchoscopy with Endobronchial Ultrasound Procedure Note  Date of Operation: 05/22/2020  Pre-op Diagnosis: adenopathy, squamous cell of tongue  Post-op Diagnosis: adenopathy, squamous cell of tongue  Surgeon: Garner Nash, DO   Assistants: none  Anesthesia: General endotracheal anesthesia  Operation: Flexible video fiberoptic bronchoscopy with endobronchial ultrasound and biopsies.  Estimated Blood Loss: Minimal, <7SJ   Complications: None   Indications and History: Alexander [name] is a 60 y.o. male with history of squamous cell of tongue base, now with ct with mediastinal adenopathy.  The risks, benefits, complications, treatment options and expected outcomes were discussed with the patient.  The possibilities of pneumothorax, pneumonia, reaction to medication, pulmonary aspiration, perforation of a viscus, bleeding, failure to diagnose a condition and creating a complication requiring transfusion or operation were discussed with the patient who freely signed the consent.    Description of Procedure: The patient was examined in the preoperative area and history and data from the preprocedure consultation were reviewed. It was deemed appropriate to proceed.  The patient was taken to [facility] endoscopy room 2, identified as Gae Gallop and the procedure verified as Flexible Video Fiberoptic Bronchoscopy.  A Time Out was held and the above information confirmed. After being taken to the operating room general anesthesia was initiated and the patient's existing tracheostomy was used for insertion into the airway. The video fiberoptic bronchoscope was introduced via the endotracheal tube and a general inspection was performed which showed normal right and left lung anatomy no evidence of endobronchial lesion however the mucosa was erythematous.. The standard scope was then withdrawn and the endobronchial ultrasound was used to identify and characterize the peritracheal, hilar and bronchial  lymph nodes. Inspection showed enlarged subcarinal adenopathy. Using real-time ultrasound guidance Wang needle biopsies were take from Station 7 nodes and were sent for cytology. The patient tolerated the procedure well without apparent complications.  The standard therapeutic bronchoscope was inserted back into the patient's airway and used for therapeutic aspiration of the bilateral mainstem's for removal of any remaining debris, thick secretions and blood clots.  There was patency of all distal subsegments at the termination of the procedure.  There was no significant blood loss. The bronchoscope was withdrawn. Anesthesia was reversed and the patient was taken to the PACU for recovery.   Samples: 1. Wang needle biopsies from Station 7 node  Plans:  The patient will be discharged from the PACU to home when recovered from anesthesia. We will review the cytology results with the patient when they become available. Outpatient followup will be with Garner Nash, DO, as well as medical and radiation oncology.    Garner Nash, DO [facility] 05/22/2020 8:50 AM

## 2020-05-23 ENCOUNTER — Ambulatory Visit: Payer: PRIVATE HEALTH INSURANCE | Admitting: Hematology and Oncology

## 2020-05-23 ENCOUNTER — Other Ambulatory Visit: Payer: Self-pay

## 2020-05-23 ENCOUNTER — Encounter (HOSPITAL_COMMUNITY): Payer: Self-pay | Admitting: Pulmonary Disease

## 2020-05-23 ENCOUNTER — Telehealth (INDEPENDENT_AMBULATORY_CARE_PROVIDER_SITE_OTHER): Payer: Self-pay

## 2020-05-23 LAB — CYTOLOGY - NON PAP

## 2020-05-24 ENCOUNTER — Other Ambulatory Visit: Payer: Self-pay

## 2020-05-24 ENCOUNTER — Ambulatory Visit
Admission: RE | Admit: 2020-05-24 | Discharge: 2020-05-24 | Disposition: A | Discharge status: Home / Self Care | Payer: Commercial Managed Care - PPO | Source: Ambulatory Visit | Attending: Otolaryngology | Admitting: Otolaryngology

## 2020-05-24 ENCOUNTER — Ambulatory Visit (HOSPITAL_COMMUNITY)
Admission: RE | Admit: 2020-05-24 | Discharge: 2020-05-24 | Disposition: A | Discharge status: Home / Self Care | Payer: BC Managed Care – PPO | Source: Ambulatory Visit | Attending: Acute Care | Admitting: Acute Care

## 2020-05-24 DIAGNOSIS — C7800 Secondary malignant neoplasm of unspecified lung: Secondary | ICD-10-CM | POA: Insufficient documentation

## 2020-05-24 DIAGNOSIS — J9509 Other tracheostomy complication: Secondary | ICD-10-CM | POA: Diagnosis present

## 2020-05-24 DIAGNOSIS — L89899 Pressure ulcer of other site, unspecified stage: Secondary | ICD-10-CM | POA: Insufficient documentation

## 2020-05-24 DIAGNOSIS — C760 Malignant neoplasm of head, face and neck: Secondary | ICD-10-CM | POA: Diagnosis not present

## 2020-05-24 DIAGNOSIS — C029 Malignant neoplasm of tongue, unspecified: Secondary | ICD-10-CM | POA: Diagnosis not present

## 2020-05-24 DIAGNOSIS — D3702 Neoplasm of uncertain behavior of tongue: Secondary | ICD-10-CM

## 2020-05-24 DIAGNOSIS — L8990 Pressure ulcer of unspecified site, unspecified stage: Secondary | ICD-10-CM | POA: Diagnosis not present

## 2020-05-24 DIAGNOSIS — C7989 Secondary malignant neoplasm of other specified sites: Secondary | ICD-10-CM | POA: Diagnosis not present

## 2020-05-24 DIAGNOSIS — Y838 Other surgical procedures as the cause of abnormal reaction of the patient, or of later complication, without mention of misadventure at the time of the procedure: Secondary | ICD-10-CM | POA: Diagnosis not present

## 2020-05-24 DIAGNOSIS — Z930 Tracheostomy status: Secondary | ICD-10-CM

## 2020-05-24 LAB — GLUCOSE, CAPILLARY: Glucose-Capillary: 100 mg/dL — ABNORMAL HIGH (ref 70–99)

## 2020-05-24 MED ORDER — FLUDEOXYGLUCOSE F - 18 (FDG) INJECTION
14.8000 | Freq: Once | INTRAVENOUS | Status: AC | PRN
  Administered 2020-05-24: 15.01 via INTRAVENOUS

## 2020-05-24 NOTE — Progress Notes (Signed)
[facility]   Reason for visit:  Trach management and care  HPI:  This is a 60 year old male w/ recent dx of SCC of tongue for which he had emergent trach placed 3/23 for airway compromise by [employer]. He is followed by Vida Rigger for radiation ONC and [facility] who just performed FOB w/ EBUS which was cytology positive for malignant cells c/w NSCC and is s/p PET scan today for further staging. He actually is scheduled to see Dr [name] again on 4/8 for planned trach change but has been referred to [place] by radiation oncology for additional support.   Note: I actually initially recommended that he hold off on our visit as he is still being managed by ENT (and I prefer these cases to be released from ENT first)  but he voiced concern that he needs further trach home care education and support so I agreed to see him    PMH   has a past medical history of Asthma, Cancer ([place]), GERD (gastroesophageal reflux disease), and Sleep apnea.  Additional Malignant hx mentioned above  Surgical history    Past Surgical History:  Procedure Laterality Date  . BRONCHIAL NEEDLE ASPIRATION BIOPSY  05/22/2020   Procedure: BRONCHIAL NEEDLE ASPIRATION BIOPSIES;  Surgeon: Garner Nash, DO;  Location: [place];  Service: Pulmonary;;  . DIRECT LARYNGOSCOPY  05/09/2020   Procedure: DIRECT LARYNGOSCOPY;  Surgeon: Rozetta Nunnery, MD;  Location: [place];  Service: ENT;;  . MASS EXCISION N/A 05/09/2020   Procedure: EXCISION TONGUE MASS;  Surgeon: Rozetta Nunnery, MD;  Location: [place];  Service: ENT;  Laterality: N/A;  . TONSILLECTOMY    . TRACHEOSTOMY TUBE PLACEMENT  05/09/2020   Procedure: TRACHEOSTOMY;  Surgeon: Rozetta Nunnery, MD;  Location: [place];  Service: ENT;;  . VIDEO BRONCHOSCOPY WITH ENDOBRONCHIAL ULTRASOUND N/A 05/22/2020   Procedure: VIDEO BRONCHOSCOPY WITH ENDOBRONCHIAL ULTRASOUND;  Surgeon: Garner Nash, DO;  Location: [place];  Service: Pulmonary;  Laterality:  N/A;   Social History   Retired first responder  Not smoking & never did  Family history   I did not ask  Allergies   No Known Allergies     No outpatient medications have been marked as taking for the 05/24/20 encounter [facility] Encounter) with [facility].     ROS  Review of Systems - History obtained from the patient General ROS: negative Psychological ROS: negative ENT ROS: positive for - some tracheal secretions requiring suction. clear mostly does have fairly large ulceration located at 6 o'clock in relation to stoma. see below. He reports pain associated with this has improve.d  negative for - epistaxis, headaches, nasal congestion, nasal discharge, sinus pain or sore throat Hematological and Lymphatic ROS: negative Respiratory ROS: no cough, shortness of breath, or wheezing Cardiovascular ROS: no chest pain or dyspnea on exertion Gastrointestinal ROS: no abdominal pain, change in bowel habits, or black or bloody stools Musculoskeletal ROS: negative Neurological ROS: no TIA or stroke symptoms  Vital signs:  reviewed Exam:  Physical Exam Vitals reviewed.  Constitutional:      General: He is not in acute distress.    Appearance: Normal appearance. He is not ill-appearing.  HENT:     Head: Normocephalic and atraumatic.     Nose: Nose normal.     Mouth/Throat:     Mouth: Mucous membranes are moist.  Eyes:     Pupils: Pupils are equal, round, and reactive to light.  Neck:  Comments: [name] site w/out sig secretions. Has no stridor w/ occlusion of trach but does have small rush of air. Phonation is strong w/ PMV however. (see picture below re: wound) Cardiovascular:     Rate and Rhythm: Normal rate.     Pulses: Normal pulses.  Pulmonary:     Effort: Pulmonary effort is normal.     Breath sounds: Normal breath sounds.  Abdominal:     General: Abdomen is flat. Bowel sounds are normal.     Palpations: Abdomen is soft.  Musculoskeletal:        General: Normal  range of motion.     Cervical back: Normal range of motion.  Skin:    General: Skin is warm.     Capillary Refill: Capillary refill takes less than 2 seconds.  Neurological:     General: No focal deficit present.     Mental Status: He is alert.  Psychiatric:        Mood and Affect: Mood normal.     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 He has yet to undergo chemo or XRT but my understanding is that tumor board has met and plan is currently being arranged. From a trach standpoint he has a appointment w/ Dr [name] tomorrow for routine trach change as he currently has a # 6 cuffed. I had initially planned on sending him w/ 6 cuffless flexible shiley BUT when I saw the ulcer noted above I fear that continuing to use this model of trach will not really help the situation. I had hoped to send him with a 7 portex but unfortunately We do not have any in stock here.  Plan  I am out of town for next 2 weeks.  I am setting him up to see me when I get back.  He believes Dr [name] has already planned to change the trach to a model w/ a smaller flange. I have messaged him and instructed Alexander [name] to ask Dr [name] to let me know what trach he decides to go with so that I can order him replacements.  In the meantime I have also provided him w/ mepiplex  dressings which he can change every 4 days My hope is that this will help heal the wound faster and get at least some pressure off the wound so that it can heal.   My time 45 minutes.      Alexander [name] ACNP-BC [place]

## 2020-05-24 NOTE — Progress Notes (Signed)
Tracheostomy Procedure Note  Gervase Colberg [id number] 1960-11-12  Pre Procedure Tracheostomy Information  Trach Brand: Shiley Size: 6.0  Flexed trach Style: Cuffed Secured by: Velcro   Procedure: Cleaning and Education    Post Procedure Tracheostomy Information  Trach Brand: Shiley Size: 6.0 Style: cuffed Secured by: Velcro   Post Procedure Evaluation: No trach change today   Education: Cleaning and trach changing instructions wound care  Prescription needs: none    Additional needs: New PMV given to patient today

## 2020-05-25 ENCOUNTER — Telehealth (INDEPENDENT_AMBULATORY_CARE_PROVIDER_SITE_OTHER): Payer: Self-pay

## 2020-05-25 ENCOUNTER — Ambulatory Visit: Payer: PRIVATE HEALTH INSURANCE | Admitting: Hematology and Oncology

## 2020-05-25 ENCOUNTER — Encounter (INDEPENDENT_AMBULATORY_CARE_PROVIDER_SITE_OTHER): Payer: PRIVATE HEALTH INSURANCE | Admitting: Otolaryngology

## 2020-05-25 ENCOUNTER — Ambulatory Visit (INDEPENDENT_AMBULATORY_CARE_PROVIDER_SITE_OTHER): Payer: BC Managed Care – PPO | Admitting: Otolaryngology

## 2020-05-25 ENCOUNTER — Other Ambulatory Visit: Payer: Self-pay

## 2020-05-25 ENCOUNTER — Other Ambulatory Visit (INDEPENDENT_AMBULATORY_CARE_PROVIDER_SITE_OTHER): Payer: Self-pay

## 2020-05-25 VITALS — Temp 97.3°F

## 2020-05-25 DIAGNOSIS — Z4889 Encounter for other specified surgical aftercare: Secondary | ICD-10-CM

## 2020-05-25 MED ORDER — FLUTICASONE PROPIONATE 50 MCG/ACT NA SUSP: 2.0000 | Freq: Every day | NASAL | 11 refills | Status: DC

## 2020-05-25 MED ORDER — CETIRIZINE HCL 10 MG PO TABS: 10.0000 mg | ORAL_TABLET | Freq: Every day | ORAL | 11 refills | Status: DC

## 2020-05-25 NOTE — Progress Notes (Signed)
Zyrtec  

## 2020-05-25 NOTE — Telephone Encounter (Signed)
[name] from [facility] called me about sending Alexander [name] in a Rx for Alexander [name] to get Flonase and Zyertec so they can cover the meds for him. This was OK by Dr. [name] and sent in to pharmacy.

## 2020-05-25 NOTE — Progress Notes (Signed)
HPI: Alexander [name] is a 60 y.o. male who presents emergent tracheostomy a little over 2 weeks ago during direct laryngoscopy and biopsy for evaluation of base of tongue cancer.  He underwent a PET scan earlier today.  He returns today for follow-up with his tracheostomy care.  He is receiving care from the tracheostomy team.  He presently has a cuffed #6 Shiley tracheostomy tube in place.  Past Medical History:  Diagnosis Date  . Asthma   . Cancer ([place])   . GERD (gastroesophageal reflux disease)   . Sleep apnea    CPAP- does not wear   Past Surgical History:  Procedure Laterality Date  . BRONCHIAL NEEDLE ASPIRATION BIOPSY  05/22/2020   Procedure: BRONCHIAL NEEDLE ASPIRATION BIOPSIES;  Surgeon: Garner Nash, DO;  Location: [facility];  Service: Pulmonary;;  . DIRECT LARYNGOSCOPY  05/09/2020   Procedure: DIRECT LARYNGOSCOPY;  Surgeon: Rozetta Nunnery, MD;  Location: [place];  Service: ENT;;  . MASS EXCISION N/A 05/09/2020   Procedure: EXCISION TONGUE MASS;  Surgeon: Rozetta Nunnery, MD;  Location: [place];  Service: ENT;  Laterality: N/A;  . TONSILLECTOMY    . TRACHEOSTOMY TUBE PLACEMENT  05/09/2020   Procedure: TRACHEOSTOMY;  Surgeon: Rozetta Nunnery, MD;  Location: [place];  Service: ENT;;  . VIDEO BRONCHOSCOPY WITH ENDOBRONCHIAL ULTRASOUND N/A 05/22/2020   Procedure: VIDEO BRONCHOSCOPY WITH ENDOBRONCHIAL ULTRASOUND;  Surgeon: Garner Nash, DO;  Location: [place];  Service: Pulmonary;  Laterality: N/A;   Social History   Socioeconomic History  . Marital status: Single    Spouse name: Not on file  . Number of children: Not on file  . Years of education: Not on file  . Highest education level: Not on file  Occupational History  . Not on file  Tobacco Use  . Smoking status: Never Smoker  . Smokeless tobacco: Never Used  Vaping Use  . Vaping Use: Never used  Substance and Sexual Activity  . Alcohol use: Not Currently    Comment: "4-5 drinks a year for  special ocassions" 05/08/20  . Drug use: Never  . Sexual activity: Not Currently  Other Topics Concern  . Not on file  Social History Narrative  . Not on file   Social Determinants of Health   Financial Resource Strain: Not on file  Food Insecurity: Not on file  Transportation Needs: Not on file  Physical Activity: Not on file  Stress: Not on file  Social Connections: Not on file   No family history on file. No Known Allergies Prior to Admission medications   Medication Sig Start Date End Date Taking? Authorizing Provider  albuterol (PROVENTIL) (2.5 MG/3ML) 0.083% nebulizer solution Take 3 mLs (2.5 mg total) by nebulization every 6 (six) hours as needed for wheezing or shortness of breath. Patient not taking: No sig reported 05/17/20   Icard, Bradley L, DO  albuterol (VENTOLIN HFA) 108 (90 Base) MCG/ACT inhaler Inhale 2 puffs into the lungs as needed for wheezing or shortness of breath. Patient not taking: No sig reported    [provider]  Ascorbic Acid (VITAMIN C PO) Take 1 tablet by mouth daily.    [provider]  bacitracin ointment Apply 1 application topically every 8 (eight) hours. Apply around trach wound 05/12/20   Aline August, MD  cetirizine (ZYRTEC) 10 MG tablet Take 10 mg by mouth daily.    [provider]  fluticasone (FLONASE) 50 MCG/ACT nasal spray Place 1 spray into both nostrils daily.  [provider]  Fluticasone-Salmeterol (ADVAIR) 100-50 MCG/DOSE AEPB Inhale 1 puff into the lungs daily.    [provider]  Multiple Vitamins-Minerals (ZINC PO) Take 1 tablet by mouth daily.    [provider]  omeprazole (PRILOSEC) 40 MG capsule Take 40 mg by mouth daily.    [provider]     Physical Exam: The cuffed #6 tracheostomy tube was removed in the office today.  He has an open wound inferior to the tracheostomy that has granulation tissue and seems to be healing adequately with no signs of cellulitis  or infection.  A new #6 cuffless Shiley tracheostomy tube was inserted along with gauze around the tracheostomy.  I reviewed the tracheostomy with the patient.  He has a red stopper for this tracheostomy they could use if he does not feel like he needs to use the trach and we tried this temporarily but he felt more comfortable breathing with the tracheostomy functioning.  We put the inner cannula which is reasonable and should be cleaned at least once a day and reviewed this with him.  Suggested possibly using the cannula with blockage of the tracheostomy with a red top to occlude the trach when he takes a shower so they can wash around this area safely without aspirating water.  Definitely recommended use of the tracheostomy being open at night when he sleeps as he has history of obstructive sleep apnea as well as a large base of tongue cancer.  But he is doing well and is comfortable with the trach.  He will receive further trach care with the tracheostomy team.   Assessment: S/p emergent tracheostomy following direct laryngoscopy and biopsy for evaluation of a large base of tongue cancer.  Plan: Patient has just completed his PET scan and hopefully will be starting radiation and chemotherapy soon.  Discussed with him that he will probably require use of the tracheostomy until he completes radiation therapy. Reviewed the tracheostomy with the patient and his daughter in the office today.   Radene Journey, MD

## 2020-05-27 ENCOUNTER — Encounter (HOSPITAL_COMMUNITY): Payer: Self-pay

## 2020-05-27 ENCOUNTER — Other Ambulatory Visit: Payer: Self-pay

## 2020-05-27 ENCOUNTER — Emergency Department (HOSPITAL_COMMUNITY): Payer: BC Managed Care – PPO

## 2020-05-27 ENCOUNTER — Inpatient Hospital Stay (HOSPITAL_COMMUNITY)
Admission: EM | Admit: 2020-05-27 | Discharge: 2020-05-30 | DRG: 202 | Disposition: A | Discharge status: Home / Self Care | Payer: BC Managed Care – PPO | Attending: Internal Medicine | Admitting: Internal Medicine

## 2020-05-27 DIAGNOSIS — J40 Bronchitis, not specified as acute or chronic: Principal | ICD-10-CM | POA: Diagnosis present

## 2020-05-27 DIAGNOSIS — E869 Volume depletion, unspecified: Secondary | ICD-10-CM | POA: Diagnosis present

## 2020-05-27 DIAGNOSIS — Z20822 Contact with and (suspected) exposure to covid-19: Secondary | ICD-10-CM | POA: Diagnosis present

## 2020-05-27 DIAGNOSIS — K219 Gastro-esophageal reflux disease without esophagitis: Secondary | ICD-10-CM | POA: Diagnosis present

## 2020-05-27 DIAGNOSIS — R52 Pain, unspecified: Secondary | ICD-10-CM

## 2020-05-27 DIAGNOSIS — R6510 Systemic inflammatory response syndrome (SIRS) of non-infectious origin without acute organ dysfunction: Secondary | ICD-10-CM | POA: Diagnosis not present

## 2020-05-27 DIAGNOSIS — A419 Sepsis, unspecified organism: Secondary | ICD-10-CM | POA: Diagnosis present

## 2020-05-27 DIAGNOSIS — E441 Mild protein-calorie malnutrition: Secondary | ICD-10-CM | POA: Diagnosis present

## 2020-05-27 DIAGNOSIS — Z930 Tracheostomy status: Secondary | ICD-10-CM

## 2020-05-27 DIAGNOSIS — C01 Malignant neoplasm of base of tongue: Secondary | ICD-10-CM | POA: Diagnosis present

## 2020-05-27 DIAGNOSIS — I959 Hypotension, unspecified: Secondary | ICD-10-CM | POA: Diagnosis present

## 2020-05-27 DIAGNOSIS — G4733 Obstructive sleep apnea (adult) (pediatric): Secondary | ICD-10-CM | POA: Diagnosis present

## 2020-05-27 DIAGNOSIS — C109 Malignant neoplasm of oropharynx, unspecified: Secondary | ICD-10-CM | POA: Diagnosis present

## 2020-05-27 DIAGNOSIS — Z6835 Body mass index (BMI) 35.0-35.9, adult: Secondary | ICD-10-CM

## 2020-05-27 DIAGNOSIS — E440 Moderate protein-calorie malnutrition: Secondary | ICD-10-CM | POA: Diagnosis present

## 2020-05-27 DIAGNOSIS — R824 Acetonuria: Secondary | ICD-10-CM | POA: Diagnosis present

## 2020-05-27 DIAGNOSIS — R509 Fever, unspecified: Secondary | ICD-10-CM

## 2020-05-27 DIAGNOSIS — E871 Hypo-osmolality and hyponatremia: Secondary | ICD-10-CM | POA: Diagnosis present

## 2020-05-27 LAB — CBC WITH DIFFERENTIAL/PLATELET
Abs Immature Granulocytes: 0.11 10*3/uL — ABNORMAL HIGH (ref 0.00–0.07)
Basophils Absolute: 0 10*3/uL (ref 0.0–0.1)
Basophils Relative: 0 %
Eosinophils Absolute: 0 10*3/uL (ref 0.0–0.5)
Eosinophils Relative: 0 %
HCT: 40 % (ref 39.0–52.0)
Hemoglobin: 13.1 g/dL (ref 13.0–17.0)
Immature Granulocytes: 1 %
Lymphocytes Relative: 3 %
Lymphs Abs: 0.5 10*3/uL — ABNORMAL LOW (ref 0.7–4.0)
MCH: 30.1 pg (ref 26.0–34.0)
MCHC: 32.8 g/dL (ref 30.0–36.0)
MCV: 92 fL (ref 80.0–100.0)
Monocytes Absolute: 0.6 10*3/uL (ref 0.1–1.0)
Monocytes Relative: 3 %
Neutro Abs: 17.3 10*3/uL — ABNORMAL HIGH (ref 1.7–7.7)
Neutrophils Relative %: 93 %
Platelets: 207 10*3/uL (ref 150–400)
RBC: 4.35 MIL/uL (ref 4.22–5.81)
RDW: 13.6 % (ref 11.5–15.5)
WBC: 18.6 10*3/uL — ABNORMAL HIGH (ref 4.0–10.5)
nRBC: 0 % (ref 0.0–0.2)

## 2020-05-27 LAB — COMPREHENSIVE METABOLIC PANEL
ALT: 57 U/L — ABNORMAL HIGH (ref 0–44)
AST: 33 U/L (ref 15–41)
Albumin: 3.1 g/dL — ABNORMAL LOW (ref 3.5–5.0)
Alkaline Phosphatase: 52 U/L (ref 38–126)
Anion gap: 10 (ref 5–15)
BUN: 7 mg/dL (ref 6–20)
CO2: 23 mmol/L (ref 22–32)
Calcium: 8.8 mg/dL — ABNORMAL LOW (ref 8.9–10.3)
Chloride: 101 mmol/L (ref 98–111)
Creatinine, Ser: 1.35 mg/dL — ABNORMAL HIGH (ref 0.61–1.24)
GFR, Estimated: >60 mL/min (ref >60)
Glucose, Bld: 128 mg/dL — ABNORMAL HIGH (ref 70–99)
Potassium: 3.7 mmol/L (ref 3.5–5.1)
Sodium: 134 mmol/L — ABNORMAL LOW (ref 135–145)
Total Bilirubin: 1.8 mg/dL — ABNORMAL HIGH (ref 0.3–1.2)
Total Protein: 6.5 g/dL (ref 6.5–8.1)

## 2020-05-27 LAB — APTT: aPTT: 35 seconds (ref 24–36)

## 2020-05-27 LAB — LACTIC ACID, PLASMA
Lactic Acid, Venous: 1.2 mmol/L (ref 0.5–1.9)
Lactic Acid, Venous: 1.1 mmol/L (ref 0.5–1.9)

## 2020-05-27 LAB — PROTIME-INR
INR: 1.3 — ABNORMAL HIGH (ref 0.8–1.2)
Prothrombin Time: 15.7 seconds — ABNORMAL HIGH (ref 11.4–15.2)

## 2020-05-27 MED ORDER — LACTATED RINGERS IV BOLUS (SEPSIS)
1000.0000 mL | Freq: Once | INTRAVENOUS | Status: AC
  Administered 2020-05-28: 1000 mL via INTRAVENOUS

## 2020-05-27 MED ORDER — SODIUM CHLORIDE 0.9 % IV SOLN
2.0000 g | Freq: Three times a day (TID) | INTRAVENOUS | Status: DC
  Administered 2020-05-28 – 2020-05-29 (×4): 2 g via INTRAVENOUS
  Filled 2020-05-27 (×4): qty 2

## 2020-05-27 MED ORDER — VANCOMYCIN HCL 1000 MG/200ML IV SOLN: 1000.0000 mg | Freq: Once | INTRAVENOUS | Status: DC

## 2020-05-27 MED ORDER — VANCOMYCIN HCL 1000 MG/200ML IV SOLN
1000.0000 mg | Freq: Two times a day (BID) | INTRAVENOUS | Status: DC
  Administered 2020-05-28 – 2020-05-29 (×3): 1000 mg via INTRAVENOUS
  Filled 2020-05-27 (×3): qty 200

## 2020-05-27 MED ORDER — LACTATED RINGERS IV BOLUS (SEPSIS)
600.0000 mL | Freq: Once | INTRAVENOUS | Status: AC
  Administered 2020-05-28: 600 mL via INTRAVENOUS

## 2020-05-27 MED ORDER — ACETAMINOPHEN 500 MG PO TABS
1000.0000 mg | ORAL_TABLET | Freq: Once | ORAL | Status: AC
  Administered 2020-05-27: 1000 mg via ORAL
  Filled 2020-05-27: qty 2

## 2020-05-27 MED ORDER — LACTATED RINGERS IV SOLN: INTRAVENOUS | Status: AC

## 2020-05-27 MED ORDER — SODIUM CHLORIDE 0.9 % IV SOLN
2.0000 g | Freq: Once | INTRAVENOUS | Status: AC
  Administered 2020-05-27: 2 g via INTRAVENOUS
  Filled 2020-05-27: qty 2

## 2020-05-27 MED ORDER — METRONIDAZOLE IN NACL 5-0.79 MG/ML-% IV SOLN
500.0000 mg | Freq: Once | INTRAVENOUS | Status: AC
  Administered 2020-05-28: 500 mg via INTRAVENOUS
  Filled 2020-05-27: qty 100

## 2020-05-27 MED ORDER — IOHEXOL 300 MG/ML  SOLN
75.0000 mL | Freq: Once | INTRAMUSCULAR | Status: AC | PRN
  Administered 2020-05-27: 75 mL via INTRAVENOUS

## 2020-05-27 MED ORDER — VANCOMYCIN HCL 2000 MG/400ML IV SOLN
2000.0000 mg | Freq: Once | INTRAVENOUS | Status: AC
  Administered 2020-05-28: 2000 mg via INTRAVENOUS
  Filled 2020-05-27: qty 400

## 2020-05-27 NOTE — Progress Notes (Signed)
Pharmacy Antibiotic Note  Alexander [name] is a 60 y.o. male admitted on 05/27/2020 with with recent trach admitted with fevers and sepsis. Pharmacy has been consulted for vancomycin and cefepime dosing. Cr is up slightly from baseline.  Plan: Cefepime 2g q8h Vancomycin 2000mg  x1 then 1000mg  q12h Follow Cr, Cx, LOT Vancomycin levels as needed  Height: 6\' 3"  (190.5 cm) Weight: 129.3 kg (285 lb) IBW/kg (Calculated) : 84.5  Temp (24hrs), Avg:103.3 F (39.6 C), Min:103.3 F (39.6 C), Max:103.3 F (39.6 C)  Recent Labs  Lab 05/27/20 2201  WBC 18.6*    Estimated Creatinine Clearance: 106.7 mL/min (by C-G formula based on SCr of 1.08 mg/dL).    No Known Allergies  Antimicrobials this admission: Vancomycin 4/10  >>   Cefepime 4/10 >>    Microbiology results: pending  Thank you for allowing pharmacy to be a part of this patient's care.  Arrie Senate, PharmD, BCPS, [facility] Clinical Pharmacist [phone] Please check AMION for all [place] numbers 05/27/2020

## 2020-05-27 NOTE — Progress Notes (Signed)
[facility] following for Sepsis Protocol 

## 2020-05-27 NOTE — ED Provider Notes (Signed)
[facility] EMERGENCY DEPARTMENT Provider Note   CSN: [id number] Arrival date & time: 05/27/20  2141     History Chief Complaint  Patient presents with  . Fever    Kayce Betty is a 60 y.o. male.  Patient presents to the ED with a chief complaint of fever and chills.  He states that the fever started this morning.  He has history of throat cancer, but has not yet started chemo or radiation.  He just had his trach changed from cuffed to uncuffed two days ago.  He reports taking Tylenol 4 hours ago.  He states that he has had some productive cough with yellow sputum.  He denies SOB, CP, or abdominal pain.  The history is provided by the patient. No language interpreter was used.       Past Medical History:  Diagnosis Date  . Asthma   . Cancer ([place])   . GERD (gastroesophageal reflux disease)   . Sleep apnea    CPAP- does not wear    Patient Active Problem List   Diagnosis Date Noted  . Squamous cell cancer of tongue ([place])   . Pressure ulcer caused by device   . 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 Mediastinal adenopathy 05/17/2020  . 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 Compromised airway 05/09/2020  . Tracheostomy care ([place])   . Acute hypoxemic respiratory failure [facility])     Past Surgical History:  Procedure Laterality Date  . BRONCHIAL NEEDLE ASPIRATION BIOPSY  05/22/2020   Procedure: BRONCHIAL NEEDLE ASPIRATION BIOPSIES;  Surgeon: Garner Nash, DO;  Location: [place] [facility];  Service: Pulmonary;;  . DIRECT LARYNGOSCOPY  05/09/2020   Procedure: DIRECT LARYNGOSCOPY;  Surgeon: Rozetta Nunnery, MD;  Location: [place];  Service: ENT;;  . MASS EXCISION N/A 05/09/2020   Procedure: EXCISION TONGUE MASS;  Surgeon: Rozetta Nunnery, MD;  Location: [place];  Service: ENT;  Laterality: N/A;  . TONSILLECTOMY    . TRACHEOSTOMY TUBE PLACEMENT  05/09/2020   Procedure: TRACHEOSTOMY;   Surgeon: Rozetta Nunnery, MD;  Location: [place];  Service: ENT;;  . VIDEO BRONCHOSCOPY WITH ENDOBRONCHIAL ULTRASOUND N/A 05/22/2020   Procedure: VIDEO BRONCHOSCOPY WITH ENDOBRONCHIAL ULTRASOUND;  Surgeon: Garner Nash, DO;  Location: [place];  Service: Pulmonary;  Laterality: N/A;       History reviewed. No pertinent family history.  Social History   Tobacco Use  . Smoking status: Never Smoker  . Smokeless tobacco: Never Used  Vaping Use  . Vaping Use: Never used  Substance Use Topics  . Alcohol use: Not Currently    Comment: "4-5 drinks a year for special ocassions" 05/08/20  . Drug use: Never    Home Medications Prior to Admission medications   Medication Sig Start Date End Date Taking? Authorizing Provider  albuterol (PROVENTIL) (2.5 MG/3ML) 0.083% nebulizer solution Take 3 mLs (2.5 mg total) by nebulization every 6 (six) hours as needed for wheezing or shortness of breath. Patient not taking: No sig reported 05/17/20   Icard, Bradley L, DO  albuterol (VENTOLIN HFA) 108 (90 Base) MCG/ACT inhaler Inhale 2 puffs into the lungs as needed for wheezing or shortness of breath. Patient not taking: No sig reported    [provider]  Ascorbic Acid (VITAMIN C PO) Take 1 tablet by mouth daily.    [provider]  bacitracin ointment Apply 1 application topically every 8 (eight) hours. Apply around trach  wound 05/12/20   Aline August, MD  cetirizine (ZYRTEC) 10 MG tablet Take 10 mg by mouth daily.    [provider]  cetirizine (ZYRTEC) 10 MG tablet Take 1 tablet (10 mg total) by mouth daily. 05/25/20   Rozetta Nunnery, MD  fluticasone (FLONASE) 50 MCG/ACT nasal spray Place 1 spray into both nostrils daily.    [provider]  fluticasone (FLONASE) 50 MCG/ACT nasal spray Place 2 sprays into both nostrils daily. Place 1 spray each nostril at night 05/25/20   Rozetta Nunnery, MD  Fluticasone-Salmeterol (ADVAIR) 100-50 MCG/DOSE AEPB  Inhale 1 puff into the lungs daily.    [provider]  Multiple Vitamins-Minerals (ZINC PO) Take 1 tablet by mouth daily.    [provider]  omeprazole (PRILOSEC) 40 MG capsule Take 40 mg by mouth daily.    [provider]    Allergies    Patient has no known allergies.  Review of Systems   Review of Systems  All other systems reviewed and are negative.   Physical Exam Updated Vital Signs BP (!) 87/59 (BP Location: Left Arm)   Pulse (!) 115   Temp (!) 103.3 F (39.6 C) (Oral)   Resp 20   Ht 6\' 3"  (1.905 m)   Wt 129.3 kg   SpO2 100%   BMI 35.62 kg/m   Physical Exam Vitals and nursing note reviewed.  Constitutional:      Appearance: He is well-developed.  HENT:     Head: Normocephalic and atraumatic.     Mouth/Throat:     Comments: Trach in place Surround tissue is CDI Eyes:     Conjunctiva/sclera: Conjunctivae normal.  Cardiovascular:     Rate and Rhythm: Normal rate and regular rhythm.     Heart sounds: No murmur heard.   Pulmonary:     Effort: Pulmonary effort is normal. No respiratory distress.     Breath sounds: Normal breath sounds.  Abdominal:     Palpations: Abdomen is soft.     Tenderness: There is no abdominal tenderness.  Musculoskeletal:        General: Normal range of motion.     Cervical back: Neck supple.  Skin:    General: Skin is warm and dry.  Neurological:     Mental Status: He is alert and oriented to person, place, and time.  Psychiatric:        Mood and Affect: Mood normal.        Behavior: Behavior normal.     ED Results / Procedures / Treatments   Labs (all labs ordered are listed, but only abnormal results are displayed) Labs Reviewed  COMPREHENSIVE METABOLIC PANEL - Abnormal; Notable for the following components:      Result Value   Sodium 134 (*)    Glucose, Bld 128 (*)    Creatinine, Ser 1.35 (*)    Calcium 8.8 (*)    Albumin 3.1 (*)    ALT 57 (*)    Total Bilirubin 1.8 (*)    All other  components within normal limits  CBC WITH DIFFERENTIAL/PLATELET - Abnormal; Notable for the following components:   WBC 18.6 (*)    Neutro Abs 17.3 (*)    Lymphs Abs 0.5 (*)    Abs Immature Granulocytes 0.11 (*)    All other components within normal limits  PROTIME-INR - Abnormal; Notable for the following components:   Prothrombin Time 15.7 (*)    INR 1.3 (*)    All other  components within normal limits  RESP PANEL BY RT-PCR (FLU A&B, COVID) ARPGX2  CULTURE, BLOOD (ROUTINE X 2)  CULTURE, BLOOD (ROUTINE X 2)  URINE CULTURE  LACTIC ACID, PLASMA  LACTIC ACID, PLASMA  APTT  URINALYSIS, ROUTINE W REFLEX MICROSCOPIC    EKG None  Radiology DG Chest 2 View  Result Date: 05/28/2020 CLINICAL DATA:  Fever. EXAM: CHEST - 2 VIEW COMPARISON:  May 10, 2020 FINDINGS: Stable tracheostomy tube positioning is noted. The heart size and mediastinal contours are within normal limits. Both lungs are clear. The questionable tiny pulmonary nodule seen overlying the upper left lung on the prior study is not visualized on the current exam. The visualized skeletal structures are unremarkable. IMPRESSION: No active cardiopulmonary disease. Electronically Signed   By: Virgina Norfolk M.D.   On: 05/28/2020 00:04   CT Soft Tissue Neck W Contrast  Result Date: 05/27/2020 CLINICAL DATA:  Fever and sore throat. 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 EXAM: CT NECK WITH CONTRAST TECHNIQUE: Multidetector CT imaging of the neck was performed using the standard protocol following the bolus administration of intravenous contrast. CONTRAST:  52mL OMNIPAQUE IOHEXOL 300 MG/ML  SOLN COMPARISON:  CT neck 04/30/2020 FINDINGS: Pharynx and larynx: Mass of the left tongue base is unchanged compared 2 05/24/2020. The pharyngeal airway remains deviated to the right. Mild thickening of the epiglottis is unchanged. Salivary glands: Parotid and submandibular glands are normal. Thyroid: Normal. Lymph nodes: Unchanged  size of left level 2 lymph node measuring 2.9 cm (3:71). Unchanged 1.8 cm right level 2A node (3:57). Unchanged right level 3 node measuring 1.5 cm (3:85). Unchanged 10 mm right level 3 node (3:73). Vascular: Unremarkable Limited intracranial: Normal Visualized orbits: Normal Mastoids and visualized paranasal sinuses: Clear. Skeleton: No acute or aggressive process. Upper chest: Tracheostomy tube tip is at the level of the clavicular heads. Lung apices are clear. Other: None IMPRESSION: 1. Unchanged appearance of mass of the left tongue base and bilateral cervical lymphadenopathy. 2. Tracheostomy tube tip is at the level of the clavicular heads. Electronically Signed   By: Ulyses Jarred M.D.   On: 05/27/2020 23:40    Procedures .Critical Care Performed by: Montine Circle, PA-C Authorized by: Montine Circle, PA-C   Critical care provider statement:    Critical care time (minutes):  45   Critical care was necessary to treat or prevent imminent or life-threatening deterioration of the following conditions:  Sepsis   Critical care was time spent personally by me on the following activities:  Discussions with consultants, evaluation of patient's response to treatment, examination of patient, ordering and performing treatments and interventions, ordering and review of laboratory studies, ordering and review of radiographic studies, pulse oximetry, re-evaluation of patient's condition, obtaining history from patient or surrogate and review of old charts     Medications Ordered in ED Medications  lactated ringers infusion (has no administration in time range)  lactated ringers bolus 1,000 mL (has no administration in time range)    And  lactated ringers bolus 1,000 mL (has no administration in time range)    And  lactated ringers bolus 600 mL (has no administration in time range)  ceFEPIme (MAXIPIME) 2 g in sodium chloride 0.9 % 100 mL IVPB (has no administration in time range)  metroNIDAZOLE  (FLAGYL) IVPB 500 mg (has no administration in time range)    ED Course  I have reviewed the triage vital signs and the nursing notes.  Pertinent labs & imaging results that  were available during my care of the patient were reviewed by me and considered in my medical decision making (see chart for details).    MDM Rules/Calculators/A&P                          This patient complains of fever and chills, this involves an extensive number of treatment options, and is a complaint that carries with it a high risk of complications and morbidity.    Differential Dx Sepsis, pneumonia, UTI, postop infection  Pertinent Labs I ordered, reviewed, and interpreted labs, which included sepsis labs, leukocytosis to 18, lactate 1.2, creatinine mildly elevated 1.35.  Imaging Interpretation I ordered imaging studies which included chest x-ray.  I independently visualized and interpreted the chest x-ray, which showed no obvious infiltrate.  CT soft tissue neck, which showed no obvious signs of infection.  Medications I ordered medication broad-spectrum antibiotics for sepsis.  Critical Interventions  Broad-spectrum antibiotics, multiple fluid boluses  Reassessments After the interventions stated above, I reevaluated the patient and found still feeling about the same.  Mentating well.   Consultants Dr. Olevia Bowens, who is appreciated for admitting.  Plan Admit    Final Clinical Impression(s) / ED Diagnoses Final diagnoses:  SIRS (systemic inflammatory response syndrome) [facility])    Rx / DC Orders ED Discharge Orders    None       Montine Circle, PA-C 05/28/20 0124    Lajean Saver, MD 05/28/20 1505

## 2020-05-27 NOTE — ED Notes (Signed)
One set of blood cultures are in the main lab

## 2020-05-27 NOTE — ED Notes (Signed)
This rn attempted 5 iv sticks w/o success, pt requesting to only use 22g on left wrist.

## 2020-05-27 NOTE — ED Triage Notes (Signed)
Pt coming from home. Pt states he was recently hospitalized for throat cancer and had been discharged 2-3 weeks ago. Pt had trach switched from cuffed to uncuffed trach two days ago. Pt woke up today feeling tired and had a fever of 102.9. Pt took tylenol approximately 4hrs ago. Pt states he feels worn out, denies pain at this time.

## 2020-05-27 NOTE — ED Notes (Signed)
MD AT BEDSIDE 

## 2020-05-27 NOTE — ED Notes (Signed)
pts daughter at bedside, updated and pt now headed to CT

## 2020-05-28 ENCOUNTER — Other Ambulatory Visit: Payer: Self-pay

## 2020-05-28 ENCOUNTER — Inpatient Hospital Stay: Admission: RE | Admit: 2020-05-28 | Payer: BC Managed Care – PPO | Source: Ambulatory Visit

## 2020-05-28 ENCOUNTER — Observation Stay (HOSPITAL_COMMUNITY): Payer: BC Managed Care – PPO

## 2020-05-28 DIAGNOSIS — E440 Moderate protein-calorie malnutrition: Secondary | ICD-10-CM | POA: Insufficient documentation

## 2020-05-28 DIAGNOSIS — E869 Volume depletion, unspecified: Secondary | ICD-10-CM | POA: Diagnosis present

## 2020-05-28 DIAGNOSIS — E441 Mild protein-calorie malnutrition: Secondary | ICD-10-CM | POA: Diagnosis present

## 2020-05-28 DIAGNOSIS — A419 Sepsis, unspecified organism: Secondary | ICD-10-CM | POA: Diagnosis present

## 2020-05-28 DIAGNOSIS — K219 Gastro-esophageal reflux disease without esophagitis: Secondary | ICD-10-CM | POA: Diagnosis present

## 2020-05-28 DIAGNOSIS — E871 Hypo-osmolality and hyponatremia: Secondary | ICD-10-CM | POA: Diagnosis present

## 2020-05-28 LAB — COMPREHENSIVE METABOLIC PANEL
ALT: 54 U/L — ABNORMAL HIGH (ref 0–44)
AST: 31 U/L (ref 15–41)
Albumin: 3 g/dL — ABNORMAL LOW (ref 3.5–5.0)
Alkaline Phosphatase: 55 U/L (ref 38–126)
Anion gap: 6 (ref 5–15)
BUN: 6 mg/dL (ref 6–20)
CO2: 25 mmol/L (ref 22–32)
Calcium: 8.5 mg/dL — ABNORMAL LOW (ref 8.9–10.3)
Chloride: 104 mmol/L (ref 98–111)
Creatinine, Ser: 1.27 mg/dL — ABNORMAL HIGH (ref 0.61–1.24)
GFR, Estimated: >60 mL/min (ref >60)
Glucose, Bld: 101 mg/dL — ABNORMAL HIGH (ref 70–99)
Potassium: 3.8 mmol/L (ref 3.5–5.1)
Sodium: 135 mmol/L (ref 135–145)
Total Bilirubin: 1.3 mg/dL — ABNORMAL HIGH (ref 0.3–1.2)
Total Protein: 6.3 g/dL — ABNORMAL LOW (ref 6.5–8.1)

## 2020-05-28 LAB — RESPIRATORY PANEL BY PCR

## 2020-05-28 LAB — CBC WITH DIFFERENTIAL/PLATELET
Abs Immature Granulocytes: 0.08 10*3/uL — ABNORMAL HIGH (ref 0.00–0.07)
Basophils Absolute: 0 10*3/uL (ref 0.0–0.1)
Basophils Relative: 0 %
Eosinophils Absolute: 0 10*3/uL (ref 0.0–0.5)
Eosinophils Relative: 0 %
HCT: 39.3 % (ref 39.0–52.0)
Hemoglobin: 12.7 g/dL — ABNORMAL LOW (ref 13.0–17.0)
Immature Granulocytes: 1 %
Lymphocytes Relative: 3 %
Lymphs Abs: 0.4 10*3/uL — ABNORMAL LOW (ref 0.7–4.0)
MCH: 30.3 pg (ref 26.0–34.0)
MCHC: 32.3 g/dL (ref 30.0–36.0)
MCV: 93.8 fL (ref 80.0–100.0)
Monocytes Absolute: 0.5 10*3/uL (ref 0.1–1.0)
Monocytes Relative: 4 %
Neutro Abs: 12.9 10*3/uL — ABNORMAL HIGH (ref 1.7–7.7)
Neutrophils Relative %: 92 %
Platelets: 214 10*3/uL (ref 150–400)
RBC: 4.19 MIL/uL — ABNORMAL LOW (ref 4.22–5.81)
RDW: 13.9 % (ref 11.5–15.5)
WBC: 14 10*3/uL — ABNORMAL HIGH (ref 4.0–10.5)
nRBC: 0 % (ref 0.0–0.2)

## 2020-05-28 LAB — RESP PANEL BY RT-PCR (FLU A&B, COVID) ARPGX2
Influenza A by PCR: NEGATIVE
Influenza B by PCR: NEGATIVE
SARS Coronavirus 2 by RT PCR: NEGATIVE

## 2020-05-28 LAB — URINALYSIS, ROUTINE W REFLEX MICROSCOPIC
Bilirubin Urine: NEGATIVE
Glucose, UA: NEGATIVE mg/dL
Hgb urine dipstick: NEGATIVE
Ketones, ur: 5 mg/dL — AB
Leukocytes,Ua: NEGATIVE
Nitrite: NEGATIVE
Protein, ur: NEGATIVE mg/dL
Specific Gravity, Urine: 1.044 — ABNORMAL HIGH (ref 1.005–1.030)
pH: 5 (ref 5.0–8.0)

## 2020-05-28 LAB — GLUCOSE, CAPILLARY: Glucose-Capillary: 100 mg/dL — ABNORMAL HIGH (ref 70–99)

## 2020-05-28 LAB — HIV ANTIBODY (ROUTINE TESTING W REFLEX): HIV Screen 4th Generation wRfx: NONREACTIVE

## 2020-05-28 MED ORDER — LORATADINE 10 MG PO TABS
10.0000 mg | ORAL_TABLET | Freq: Every day | ORAL | Status: DC
  Administered 2020-05-28 – 2020-05-30 (×3): 10 mg via ORAL
  Filled 2020-05-28 (×3): qty 1

## 2020-05-28 MED ORDER — METRONIDAZOLE IN NACL 5-0.79 MG/ML-% IV SOLN
500.0000 mg | Freq: Three times a day (TID) | INTRAVENOUS | Status: DC
  Administered 2020-05-28 – 2020-05-30 (×7): 500 mg via INTRAVENOUS
  Filled 2020-05-28 (×7): qty 100

## 2020-05-28 MED ORDER — ONDANSETRON HCL 4 MG/2ML IJ SOLN: 4.0000 mg | Freq: Four times a day (QID) | INTRAMUSCULAR | Status: DC | PRN

## 2020-05-28 MED ORDER — ONDANSETRON HCL 4 MG PO TABS: 4.0000 mg | ORAL_TABLET | Freq: Four times a day (QID) | ORAL | Status: DC | PRN

## 2020-05-28 MED ORDER — MOMETASONE FURO-FORMOTEROL FUM 100-5 MCG/ACT IN AERO
2.0000 | INHALATION_SPRAY | Freq: Two times a day (BID) | RESPIRATORY_TRACT | Status: DC
  Filled 2020-05-28: qty 8.8

## 2020-05-28 MED ORDER — ALBUTEROL SULFATE (2.5 MG/3ML) 0.083% IN NEBU: 2.5000 mg | INHALATION_SOLUTION | Freq: Four times a day (QID) | RESPIRATORY_TRACT | Status: DC | PRN

## 2020-05-28 MED ORDER — PANTOPRAZOLE SODIUM 40 MG PO TBEC
40.0000 mg | DELAYED_RELEASE_TABLET | Freq: Every day | ORAL | Status: DC
  Administered 2020-05-28 – 2020-05-30 (×3): 40 mg via ORAL
  Filled 2020-05-28 (×3): qty 1

## 2020-05-28 MED ORDER — ACETAMINOPHEN 650 MG RE SUPP
650.0000 mg | Freq: Four times a day (QID) | RECTAL | Status: DC | PRN
Start: 2020-05-28 — End: 2020-05-30

## 2020-05-28 MED ORDER — BACITRACIN ZINC 500 UNIT/GM EX OINT
1.0000 "application " | TOPICAL_OINTMENT | Freq: Three times a day (TID) | CUTANEOUS | Status: DC
  Administered 2020-05-28 – 2020-05-30 (×6): 1 via TOPICAL
  Filled 2020-05-28: qty 28.4

## 2020-05-28 MED ORDER — ENOXAPARIN SODIUM 40 MG/0.4ML ~~LOC~~ SOLN
40.0000 mg | SUBCUTANEOUS | Status: DC
  Administered 2020-05-28 – 2020-05-30 (×3): 40 mg via SUBCUTANEOUS
  Filled 2020-05-28 (×3): qty 0.4

## 2020-05-28 MED ORDER — ENSURE ENLIVE PO LIQD: 237.0000 mL | Freq: Two times a day (BID) | ORAL | Status: DC

## 2020-05-28 MED ORDER — ACETAMINOPHEN 325 MG PO TABS
650.0000 mg | ORAL_TABLET | Freq: Four times a day (QID) | ORAL | Status: DC | PRN
Start: 2020-05-28 — End: 2020-05-30
  Administered 2020-05-28 – 2020-05-29 (×3): 650 mg via ORAL
  Filled 2020-05-28 (×3): qty 2

## 2020-05-28 NOTE — Progress Notes (Signed)
Initial Nutrition Assessment  DOCUMENTATION CODES:   Non-severe (moderate) malnutrition in context of acute illness/injury  INTERVENTION:   Last recommend diet per SLP on 3/25 was DYS 3 with thin liquids   Magic cup TID with meals, each supplement provides 290 kcal and 9 grams of protein  Carnation Instant Breakfast PO BID, each supplement provides 220 kcal and 13 grams of protein.   MVI daily   NUTRITION DIAGNOSIS:   Moderate Malnutrition related to acute illness (squamos cell carcinoma of the tongue) as evidenced by mild muscle depletion,energy intake < or equal to 50% for > or equal to 5 days.  GOAL:   Patient will meet greater than or equal to 90% of their needs  MONITOR:   PO intake,Supplement acceptance,Labs,Weight trends,I & O's  REASON FOR ASSESSMENT:   Malnutrition Screening Tool    ASSESSMENT:   Patient with PMH significant for asthma, GERD, and squamous cell carcinoma of the tongue base s/p tracheostomy. Presents this admission with sepsis due to undetermined organism and hypernatremia.  Patient endorses decreased intake over the last 3-4 months due to the lump on the side of patients neck making it difficult to swallow. States over this time he consumed softer foods such as scrambled eggs with cheese, soup, mashed potatoes, mac and cheese, melons, and greens. Did not use supplementation. Last SLP visit was on 3/25 and patient was advised to stay on DYS 3 with thin liquids. Patient may benefit from another evaluation. Discussed the importance of protein intake for preservation of lean body mass. Patient does not like milky supplements as they increase his mucus production. Does not wish to have Ensure but willing to try [employer] and carnations.   Patient reports a UBW of 320 lb and a 40 lb weight loss over the last month. Records indicate patient weighed 292 lb on 3/23 and 285 lb this admission (2.5% wt loss in 2.5 weeks, insignificant for time frame).    Medications: reviewed  Labs: CBG 101-128  NUTRITION - FOCUSED PHYSICAL EXAM:  Flowsheet Row Most Recent Value  Orbital Region No depletion  Upper Arm Region No depletion  Thoracic and Lumbar Region Unable to assess  Buccal Region No depletion  Temple Region Mild depletion  Clavicle Bone Region Mild depletion  Clavicle and Acromion Bone Region Mild depletion  Scapular Bone Region Unable to assess  Dorsal Hand No depletion  Patellar Region No depletion  Anterior Thigh Region No depletion  Posterior Calf Region No depletion  Edema (RD Assessment) Mild  Hair Reviewed  Eyes Reviewed  Mouth Reviewed  Skin Reviewed  Nails Reviewed     Diet Order:   Diet Order            Diet Heart Room service appropriate? Yes; Fluid consistency: Thin  Diet effective now                 EDUCATION NEEDS:   Education needs have been addressed  Skin:  Skin Assessment: Skin Integrity Issues: Skin Integrity Issues:: Incisions Incisions: closed neck  Last BM:  PTA  Height:   Ht Readings from Last 1 Encounters:  05/28/20 _0  (1.905 m)    Weight:   Wt Readings from Last 1 Encounters:  05/28/20 129.3 kg    BMI:  Body mass index is 35.64 kg/m.  Estimated Nutritional Needs:   Kcal:  2600-2800 kcal  Protein:  130-145 grams  Fluid:  >/= 2.5 L/day  Mariana Single RD, LDN Clinical Nutrition Pager listed in [place]

## 2020-05-28 NOTE — Progress Notes (Addendum)
Patient placed in observation after midnight, please see H&P.  Here with fever.  Unclear etiology.  Urine ok, x ray ok, check NP swab and tracheal aspirate.  Cultures pending. Continue broad spectrum abx Will notify ENT of admission- dr [name] -repeat chest x ray in AM Patient to have appointment with dental on 4/12-- have left message with office and get dg orthopantogram Patient also to have appointment with oncology on Wednesday- added to treatment team.  Eulogio Bear DO

## 2020-05-28 NOTE — ED Notes (Signed)
Pt sleeping and bp became soft, 94/62 map 63, pts fluids on pressure bag, dr Sedonia Small at bedside

## 2020-05-28 NOTE — H&P (Signed)
History and Physical    Alexander [name] GUR:[id number] DOB: [birth year] DOA: 05/27/2020  PCP: Pcp, No  Patient coming from: Home.  I have personally briefly reviewed patient's old medical records in [place]  Chief Complaint: Fever.  HPI: Alexander [name] is a 60 y.o. 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  The patient had his tracheostomy cough changed on Friday.  He mentions that he has been coughing yellowish looking sputum for the past few days.  He denies rhinorrhea, wheezing or hemoptysis.  No chest pain, palpitations, diaphoresis, PND, orthopnea or pitting edema of the lower extremities.  Denies abdominal pain, nausea, emesis, diarrhea, constipation, melena hematochezia.  No dysuria, frequency hematuria.  No polyuria, polydipsia, polyphagia or blurred vision.  ED Course: Initial vital signs were temperature 103.3 F, pulse 115, respiration 20, BP 87/59 mmHg O2 sat 100% on room air.  The patient received a 2600 mL LR bolus, cefepime, metronidazole and vancomycin.  Labwork: Urinalysis showed a specific gravity of 1.044 and ketonuria 5 mg/dL.  The rest of the UA is unremarkable.  CBC showed a white count of 18.6 with 93% neutrophils, hemoglobin 13.1 g/dL platelets 207.  PT 15.7, INR 1.3 and PTT 35.  Lactic acid was 1.2 and then 1.1 mmol/L.  CMP showed a sodium 134 mmol/L, but all other electrolytes are normal point calcium is corrected to albumin.  Glucose 128, BUN 7 and creatinine 1.35 mg/dL.  His hepatic function tests showed an albumin level of 3.1 g/dL, ALT of 57 units/L and total bilirubin of 1.8 mg/dL.  Imaging: 2 view chest radiograph did not show any active cardiopulmonary disease.  CT soft tissue neck with contrast show unchanged  appearance of mass of the left tongue base and bilateral cervical lymphadenopathy.  Tracheostomy tube tip is at the level of the clavicular heads.  Please see images and full radiology report for further detail.   Review of Systems: As per HPI otherwise all other systems reviewed and are negative.  Past Medical History:  Diagnosis Date  . Asthma   . Cancer ([place])   . GERD (gastroesophageal reflux disease)   . Sleep apnea    CPAP- does not wear   Past Surgical History:  Procedure Laterality Date  . BRONCHIAL NEEDLE ASPIRATION BIOPSY  05/22/2020   Procedure: BRONCHIAL NEEDLE ASPIRATION BIOPSIES;  Surgeon: Garner Nash, DO;  Location: [facility];  Service: Pulmonary;;  . DIRECT LARYNGOSCOPY  05/09/2020   Procedure: DIRECT LARYNGOSCOPY;  Surgeon: Rozetta Nunnery, MD;  Location: [facility];  Service: ENT;;  . MASS EXCISION N/A 05/09/2020   Procedure: EXCISION TONGUE MASS;  Surgeon: Rozetta Nunnery, MD;  Location: [facility];  Service: ENT;  Laterality: N/A;  . TONSILLECTOMY    . TRACHEOSTOMY TUBE PLACEMENT  05/09/2020   Procedure: TRACHEOSTOMY;  Surgeon: Rozetta Nunnery, MD;  Location: [facility];  Service: ENT;;  . VIDEO BRONCHOSCOPY WITH ENDOBRONCHIAL ULTRASOUND N/A 05/22/2020   Procedure: VIDEO BRONCHOSCOPY WITH ENDOBRONCHIAL ULTRASOUND;  Surgeon: Garner Nash, DO;  Location: [facility];  Service: Pulmonary;  Laterality: N/A;    Social History  reports that he has never smoked. He has never used smokeless tobacco. He reports previous alcohol use. He reports that he does not use drugs.  No  Known Allergies  History reviewed. No pertinent family history.  Prior to Admission medications   Medication Sig Start Date End Date Taking? Authorizing Provider  albuterol (PROVENTIL) (2.5 MG/3ML) 0.083% nebulizer solution Take 3 mLs (2.5 mg total) by nebulization every 6 (six) hours as needed for wheezing or shortness of breath. Patient not taking: No sig reported 05/17/20   Icard, Bradley  L, DO  albuterol (VENTOLIN HFA) 108 (90 Base) MCG/ACT inhaler Inhale 2 puffs into the lungs as needed for wheezing or shortness of breath. Patient not taking: No sig reported    [provider]  Ascorbic Acid (VITAMIN C PO) Take 1 tablet by mouth daily.    [provider]  bacitracin ointment Apply 1 application topically every 8 (eight) hours. Apply around trach wound 05/12/20   Aline August, MD  cetirizine (ZYRTEC) 10 MG tablet Take 10 mg by mouth daily.    [provider]  cetirizine (ZYRTEC) 10 MG tablet Take 1 tablet (10 mg total) by mouth daily. 05/25/20   Rozetta Nunnery, MD  fluticasone (FLONASE) 50 MCG/ACT nasal spray Place 1 spray into both nostrils daily.    [provider]  fluticasone (FLONASE) 50 MCG/ACT nasal spray Place 2 sprays into both nostrils daily. Place 1 spray each nostril at night 05/25/20   Rozetta Nunnery, MD  Fluticasone-Salmeterol (ADVAIR) 100-50 MCG/DOSE AEPB Inhale 1 puff into the lungs daily.    [provider]  Multiple Vitamins-Minerals (ZINC PO) Take 1 tablet by mouth daily.    [provider]  omeprazole (PRILOSEC) 40 MG capsule Take 40 mg by mouth daily.    [provider]   Physical Exam: Vitals:   05/28/20 0130 05/28/20 0145 05/28/20 0230 05/28/20 0308  BP: 102/61 104/74 107/60 109/64  Pulse: 91 99 91 93  Resp: 20 (!) 21 14 19   Temp:    99.6 F (37.6 C)  TempSrc:    Oral  SpO2: 99% (!) 89% 100% 100%  Weight:    129.3 kg  Height:    6\' 3"  (1.905 m)   Constitutional: Looks acutely ill.  NAD, calm, comfortable Eyes: PERRL, lids and conjunctivae normal ENMT: Mucous membranes are mildly dry. Posterior pharynx clear of any exudate or lesions. Neck: Tracheostomy in place.  Supple, no masses, no thyromegaly Respiratory: Decreased breath sounds in bases, otherwise clear to auscultation bilaterally, no wheezing, no crackles. Normal respiratory effort. No accessory muscle use.   Cardiovascular: Regular rate and rhythm, no murmurs / rubs / gallops. No extremity edema. 2+ pedal pulses. No carotid bruits.  Abdomen: Obese, no distention.  Bowel sounds positive.  Soft, no tenderness, no masses palpated. No hepatosplenomegaly. Musculoskeletal: no clubbing / cyanosis.  Good ROM, no contractures. Normal muscle tone.  Skin: no acute rashes, lesions, ulcers on very limited dermatological examination. Neurologic: CN 2-12 grossly intact. Sensation intact, DTR normal. Strength 5/5 in all 4.  Psychiatric: Normal judgment and insight. Alert and oriented x 3. Normal mood.   Labs on Admission: I have personally reviewed following labs and imaging studies  CBC: Recent Labs  Lab 05/27/20 2201  WBC 18.6*  NEUTROABS 17.3*  HGB 13.1  HCT 40.0  MCV 92.0  PLT 850    Basic Metabolic Panel: Recent Labs  Lab 05/27/20 2201  NA 134*  K 3.7  CL 101  CO2 23  GLUCOSE 128*  BUN 7  CREATININE 1.35*  CALCIUM 8.8*    GFR: Estimated Creatinine Clearance: 85.3 mL/min (A) (by C-G formula based on  SCr of 1.35 mg/dL (H)).  Liver Function Tests: Recent Labs  Lab 05/27/20 2201  AST 33  ALT 57*  ALKPHOS 52  BILITOT 1.8*  PROT 6.5  ALBUMIN 3.1*   Urine analysis:    Component Value Date/Time   COLORURINE YELLOW 05/28/2020 0129   APPEARANCEUR CLEAR 05/28/2020 0129   LABSPEC 1.044 (H) 05/28/2020 0129   PHURINE 5.0 05/28/2020 0129   GLUCOSEU NEGATIVE 05/28/2020 0129   HGBUR NEGATIVE 05/28/2020 0129   BILIRUBINUR NEGATIVE 05/28/2020 0129   KETONESUR 5 (A) 05/28/2020 0129   PROTEINUR NEGATIVE 05/28/2020 0129   NITRITE NEGATIVE 05/28/2020 0129   LEUKOCYTESUR NEGATIVE 05/28/2020 0129   Radiological Exams on Admission: DG Chest 2 View  Result Date: 05/28/2020 CLINICAL DATA:  Fever. EXAM: CHEST - 2 VIEW COMPARISON:  May 10, 2020 FINDINGS: Stable tracheostomy tube positioning is noted. The heart size and mediastinal contours are within normal limits. Both lungs are clear. The  questionable tiny pulmonary nodule seen overlying the upper left lung on the prior study is not visualized on the current exam. The visualized skeletal structures are unremarkable. IMPRESSION: No active cardiopulmonary disease. Electronically Signed   By: Virgina Norfolk M.D.   On: 05/28/2020 00:04   CT Soft Tissue Neck W Contrast  Result Date: 05/27/2020 CLINICAL DATA:  Fever and sore throat. 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 EXAM: CT NECK WITH CONTRAST TECHNIQUE: Multidetector CT imaging of the neck was performed using the standard protocol following the bolus administration of intravenous contrast. CONTRAST:  16mL OMNIPAQUE IOHEXOL 300 MG/ML  SOLN COMPARISON:  CT neck 04/30/2020 FINDINGS: Pharynx and larynx: Mass of the left tongue base is unchanged compared 2 05/24/2020. The pharyngeal airway remains deviated to the right. Mild thickening of the epiglottis is unchanged. Salivary glands: Parotid and submandibular glands are normal. Thyroid: Normal. Lymph nodes: Unchanged size of left level 2 lymph node measuring 2.9 cm (3:71). Unchanged 1.8 cm right level 2A node (3:57). Unchanged right level 3 node measuring 1.5 cm (3:85). Unchanged 10 mm right level 3 node (3:73). Vascular: Unremarkable Limited intracranial: Normal Visualized orbits: Normal Mastoids and visualized paranasal sinuses: Clear. Skeleton: No acute or aggressive process. Upper chest: Tracheostomy tube tip is at the level of the clavicular heads. Lung apices are clear. Other: None IMPRESSION: 1. Unchanged appearance of mass of the left tongue base and bilateral cervical lymphadenopathy. 2. Tracheostomy tube tip is at the level of the clavicular heads. Electronically Signed   By: Ulyses Jarred M.D.   On: 05/27/2020 23:40    EKG: Independently reviewed.  Vent. rate 103 BPM PR interval 146 ms QRS duration 78 ms QT/QTcB 297/389 ms P-R-T axes 29 11 0 Sinus tachycardia Low voltage, precordial leads Left ventricular  hypertrophy  Assessment/Plan Principal Problem:   Sepsis due to undetermined organism ([place]) Observation/progressive unit. Continue time-limited LR infusion. Continue cefepime per pharmacy per Continue vancomycin per pharmacy. Continue metronidazole 500 mg IVPB q 8 hr. Follow-up blood culture and sensitivity  Active Problems:   Hyponatremia In the setting of decreased oral intake. Continue IV fluids. Monitor sodium level.    Volume depletion Continue IV fluids. Monitor intake and output.    Hyperbilirubinemia In the setting of volume depletion. Follow-up bilirubin level.  Tracheostomy dependence (HCC) Supplemental oxygen, bronchodilators and suctioning as needed.    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 Treatment may be postponed until his sepsis resolves.    GERD (gastroesophageal reflux disease) Continue PPI.  Mild protein-calorie malnutrition (HCC) Protein supplementation. Consider nutritional services evaluation.   DVT prophylaxis: Lovenox SQ. Code Status:   Full code. Family Communication: Disposition Plan:   Patient is from:  Home.  Anticipated DC to:  Home.  Anticipated DC date:  05/29/2020  Or 05/30/2020.  Anticipated DC barriers: Clinical status.  Consults called: Admission status:  Observation/progressive.  Severity of Illness: High severity due to presenting with fever, hypotension, tachycardia, fatigue and malaise requiring vigorous fluid resuscitation along initial presentation.  The patient will need to remain in the hospital for 48 to 72 hours to continue IV antibiotic therapy.   Reubin Milan MD [facility]  How to contact the [facility] Attending or Consulting provider [place] or covering provider during after hours [place], for this patient?   1. Check the care team in [facility] and look for a) attending/consulting [facility] provider listed and b) the [facility] team listed 2. Log into [email or web address] and use [facility]'s universal password to access. If you do not have the password, please contact the hospital operator. 3. Locate the [facility] provider you are looking for under [facility] and page to a number that you can be directly reached. 4. If you still have difficulty reaching the provider, please page the [facility] (Director on Call) for the Hospitalists listed on amion for assistance.  05/28/2020, 3:30 AM   This document was prepared using Dragon voice recognition software and may contain some unintended transcription errors.

## 2020-05-29 ENCOUNTER — Observation Stay (HOSPITAL_COMMUNITY): Payer: BC Managed Care – PPO

## 2020-05-29 ENCOUNTER — Other Ambulatory Visit (HOSPITAL_COMMUNITY): Payer: PRIVATE HEALTH INSURANCE | Admitting: Dentistry

## 2020-05-29 DIAGNOSIS — Z6835 Body mass index (BMI) 35.0-35.9, adult: Secondary | ICD-10-CM | POA: Diagnosis not present

## 2020-05-29 DIAGNOSIS — C01 Malignant neoplasm of base of tongue: Secondary | ICD-10-CM | POA: Diagnosis present

## 2020-05-29 DIAGNOSIS — A419 Sepsis, unspecified organism: Secondary | ICD-10-CM | POA: Diagnosis not present

## 2020-05-29 DIAGNOSIS — Z20822 Contact with and (suspected) exposure to covid-19: Secondary | ICD-10-CM | POA: Diagnosis present

## 2020-05-29 DIAGNOSIS — G4733 Obstructive sleep apnea (adult) (pediatric): Secondary | ICD-10-CM | POA: Diagnosis present

## 2020-05-29 DIAGNOSIS — J40 Bronchitis, not specified as acute or chronic: Secondary | ICD-10-CM | POA: Diagnosis present

## 2020-05-29 DIAGNOSIS — I959 Hypotension, unspecified: Secondary | ICD-10-CM | POA: Diagnosis present

## 2020-05-29 DIAGNOSIS — E871 Hypo-osmolality and hyponatremia: Secondary | ICD-10-CM | POA: Diagnosis present

## 2020-05-29 DIAGNOSIS — K219 Gastro-esophageal reflux disease without esophagitis: Secondary | ICD-10-CM | POA: Diagnosis present

## 2020-05-29 DIAGNOSIS — R6510 Systemic inflammatory response syndrome (SIRS) of non-infectious origin without acute organ dysfunction: Secondary | ICD-10-CM | POA: Diagnosis present

## 2020-05-29 DIAGNOSIS — E440 Moderate protein-calorie malnutrition: Secondary | ICD-10-CM | POA: Diagnosis present

## 2020-05-29 DIAGNOSIS — Z930 Tracheostomy status: Secondary | ICD-10-CM | POA: Diagnosis not present

## 2020-05-29 DIAGNOSIS — E869 Volume depletion, unspecified: Secondary | ICD-10-CM | POA: Diagnosis present

## 2020-05-29 DIAGNOSIS — R824 Acetonuria: Secondary | ICD-10-CM | POA: Diagnosis present

## 2020-05-29 LAB — CBC WITH DIFFERENTIAL/PLATELET
Abs Immature Granulocytes: 0.06 10*3/uL (ref 0.00–0.07)
Basophils Absolute: 0 10*3/uL (ref 0.0–0.1)
Basophils Relative: 0 %
Eosinophils Absolute: 0 10*3/uL (ref 0.0–0.5)
Eosinophils Relative: 0 %
HCT: 34.9 % — ABNORMAL LOW (ref 39.0–52.0)
Hemoglobin: 11.6 g/dL — ABNORMAL LOW (ref 13.0–17.0)
Immature Granulocytes: 1 %
Lymphocytes Relative: 5 %
Lymphs Abs: 0.6 10*3/uL — ABNORMAL LOW (ref 0.7–4.0)
MCH: 30.6 pg (ref 26.0–34.0)
MCHC: 33.2 g/dL (ref 30.0–36.0)
MCV: 92.1 fL (ref 80.0–100.0)
Monocytes Absolute: 1 10*3/uL (ref 0.1–1.0)
Monocytes Relative: 9 %
Neutro Abs: 9.9 10*3/uL — ABNORMAL HIGH (ref 1.7–7.7)
Neutrophils Relative %: 85 %
Platelets: 172 10*3/uL (ref 150–400)
RBC: 3.79 MIL/uL — ABNORMAL LOW (ref 4.22–5.81)
RDW: 13.8 % (ref 11.5–15.5)
WBC: 11.6 10*3/uL — ABNORMAL HIGH (ref 4.0–10.5)
nRBC: 0 % (ref 0.0–0.2)

## 2020-05-29 LAB — GLUCOSE, CAPILLARY
Glucose-Capillary: 103 mg/dL — ABNORMAL HIGH (ref 70–99)
Glucose-Capillary: 119 mg/dL — ABNORMAL HIGH (ref 70–99)

## 2020-05-29 LAB — BASIC METABOLIC PANEL
Anion gap: 9 (ref 5–15)
BUN: 8 mg/dL (ref 6–20)
CO2: 23 mmol/L (ref 22–32)
Calcium: 8.4 mg/dL — ABNORMAL LOW (ref 8.9–10.3)
Chloride: 101 mmol/L (ref 98–111)
Creatinine, Ser: 1.12 mg/dL (ref 0.61–1.24)
GFR, Estimated: >60 mL/min (ref >60)
Glucose, Bld: 108 mg/dL — ABNORMAL HIGH (ref 70–99)
Potassium: 3.5 mmol/L (ref 3.5–5.1)
Sodium: 133 mmol/L — ABNORMAL LOW (ref 135–145)

## 2020-05-29 LAB — MRSA PCR SCREENING: MRSA by PCR: NEGATIVE

## 2020-05-29 MED ORDER — SODIUM CHLORIDE 0.9 % IV SOLN
2.0000 g | INTRAVENOUS | Status: DC
  Administered 2020-05-29: 2 g via INTRAVENOUS
  Filled 2020-05-29 (×2): qty 20

## 2020-05-29 NOTE — Consult Note (Signed)
I was notified yesterday that Alexander [name] was admitted to the hospital with elevated temperature and sepsis symptoms and started on broad-spectrum IV antibiotics. Patient is well-known to me as he is status post tracheostomy, direct laryngoscopy and biopsy of a large base of tongue mass performed 3 weeks ago.  He had just recently undergone PET scan. He has a scheduled appointment this week to see radiation oncology and begin treatment therapy for his base of tongue cancer. Patient is awake and alert and talking well.  He has some difficulty swallowing as expected. On exam of the tracheostomy he has no swelling or signs of cellulitis.  Review of the CT scan shows the trach to be in good position. Patient is having no difficulty breathing and uses a Passy-Muir valve with good speech.  He does have history of obstructive sleep apnea but this is resolved with the tracheostomy. Hopefully he will be able to start his cancer treatment sooner than later. I will be available if he has any trouble with his tracheostomy.

## 2020-05-29 NOTE — Progress Notes (Signed)
PROGRESS NOTE    Alexander [name]  WCH:[id number] DOB: 21-Mar-1960 DOA: 05/27/2020 PCP: Pcp, No   Chief Complaint  Patient presents with  . Fever  Brief Narrative: 60 year old male with asthma, GERD, sleep apnea not on CPAP, squamous cell carcinoma of the tongue, tracheostomy in place recently admitted and discharged several weeks ago due to throat cancer presents to the ED with fatigue, decreased appetite, fever of 102.9. As per report at this tracheostomy cough changed on Friday and has been having yellowish sputum cough past few days no wheezing hemoptysis chest pain palpitation diaphoresis orthopnea, diarrhea dysuria urinary frequency. ED Course: Initial vital signs were temperature 103.3 F, pulse 115, respiration 20, BP 87/59 mmHg O2 sat 100% on room air.  The patient received a 2600 mL LR bolus, cefepime, metronidazole and vancomycin.  Labwork: Urinalysis showed a specific gravity of 1.044 and ketonuria 5 mg/dL.  The rest of the UA is unremarkable.  CBC showed a white count of 18.6 with 93% neutrophils, hemoglobin 13.1 g/dL platelets 207.  PT 15.7, INR 1.3 and PTT 35.  Lactic acid was 1.2 and then 1.1 mmol/L.  CMP showed a sodium 134 mmol/L, but all other electrolytes are normal point calcium is corrected to albumin.  Glucose 128, BUN 7 and creatinine 1.35 mg/dL.  His hepatic function tests showed an albumin level of 3.1 g/dL, ALT of 57 units/L and total bilirubin of 1.8 mg/dL.  Imaging: 2 view chest radiograph did not show any active cardiopulmonary disease.  CT soft tissue neck with contrast show unchanged appearance of mass of the left tongue base and bilateral cervical lymphadenopathy.  Tracheostomy tube tip is at the level of the clavicular heads.    Patient was admitted for broad-spectrum antibiotics. Respiratory culture, urine culture and blood culture were drawn.  Subjective: Seen this morning patient is alert awake not in distress tracheal cough in place saturating well.  Able to  interact and speak.  Having productive cough. Reports he saw ENT doctor this morning No fever spike since 3 PM yesterday.  On trach collar 5 L saturating well, WBC downtrending.  Assessment & Plan: Sepsis POA due to undetermined organism: Unclear source.Respiratory culture - gram stain- abundant GPC, GNR, CPR, culture pending, respiratory virus panel/COVID-19 negative, blood culture pending.  On Broard spectrum antibiotics with vancomycin cefepime and Flagyl, no fever recurrence since yesterday afternoon,WBC downtrending.  Has tracheostomy in place with increased cough recently could be bronchitis, ENT Dr. 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   Recent Labs  Lab 05/27/20 2201 05/27/20 2230 05/28/20 0602 05/29/20 0321  WBC 18.6*  --  14.0* 11.6*  LATICACIDVEN 1.2 1.1  --   --    Large base of tongue mass 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Tracheostomy dependence  PET scan 05/24/20: Large mass in the oropharynx extending into the supraglottic larynx bilateral cervical lymphadenopathy Marked hypermetabolic features subcarinal positive noeds- questionable areas of variable density in the liver potentially increased metabolic activity tah twill need ct w/ contrast.small pulmonary nodules nonspecific. ENT Dr [name] saw patient this am. Patient has oncology follow-up tomorrow at 10:00. Dental office notified 4/11, and has an appointment on 4/12 orthopantogram ordered- by Dr Eliseo Squires and no destructive bony lesion. Oncology  team has been added on treatment team.  Hopefully can start chemotherapy soon  Volume depletion/hyponatremia: Received IV fluids.  Sodium stable. Recent Labs  Lab 05/27/20 2201 05/28/20 0602 05/29/20 0321  NA 134* 135 133*   GERD continue PPI  Moderate protein calorie malnutrition dietitian on board augment  nutrition  Hyperbilirubinemia-monitor.  Diet Order            DIET DYS 3 Room service appropriate? Yes; Fluid consistency: Thin  Diet effective now                 Nutrition Problem: Moderate Malnutrition Etiology: acute illness (squamos cell carcinoma of the tongue) Signs/Symptoms: mild muscle depletion,energy intake < or equal to 50% for > or equal to 5 days Interventions: Magic cup,Carnation Instant Breakfast Patient's Body mass index is 35.64 kg/m. DVT prophylaxis: enoxaparin (LOVENOX) injection 40 mg Start: 05/28/20 1000 Code Status:   Code Status: Full Code  Family Communication: plan of care discussed with patient at bedside.  Status is: admitted as Observation Remains hospitalized for ongoing management of fever possible sepsis Dispo: The patient is from: Home              Anticipated d/c is to: Home likely tomorrow if remains fever free overnight and cultures negative. Can d/c in am so taht he can see his oncology tomorrow.              Patient currently is not medically stable to d/c.   Difficult to place patient No    Unresulted Labs (From admission, onward)          Start     Ordered   06/04/20 0500  Creatinine, serum  (enoxaparin (LOVENOX)    CrCl >/= 30 ml/min)  Weekly,   R     Comments: while on enoxaparin therapy    05/28/20 0331   05/28/20 0500  CBC with Differential  Daily,   R      05/28/20 0244   05/27/20 2213  Urine culture  (Septic presentation on arrival (screening labs, nursing and treatment orders for obvious sepsis))  ONCE - STAT,   STAT        05/27/20 2214         Medications reviewed:  Scheduled Meds: . bacitracin  1 application Topical G1W  . enoxaparin (LOVENOX) injection  40 mg Subcutaneous Q24H  . feeding supplement  237 mL Oral BID BM  . loratadine  10 mg Oral Daily  . pantoprazole  40 mg Oral Daily   Continuous Infusions: . ceFEPime (MAXIPIME) IV 2 g (05/29/20 0609)  . metronidazole Stopped (05/28/20 2333)  . vancomycin 1,000  mg (05/29/20 0206)    Consultants:see note  Procedures:see note  Antimicrobials: Anti-infectives (From admission, onward)   Start     Dose/Rate Route Frequency Ordered Stop   05/28/20 1100  vancomycin (VANCOREADY) IVPB 1000 mg/200 mL        1,000 mg 200 mL/hr over 60 Minutes Intravenous Every 12 hours 05/27/20 2246     05/28/20 0700  ceFEPIme (MAXIPIME) 2 g in sodium chloride 0.9 % 100 mL IVPB        2 g 200 mL/hr over 30 Minutes Intravenous Every 8 hours 05/27/20 2246     05/28/20 0600  metroNIDAZOLE (FLAGYL) IVPB 500 mg        500 mg 100 mL/hr over 60 Minutes Intravenous Every 8 hours 05/28/20 0242     05/27/20 2300  vancomycin (VANCOREADY) IVPB 2000 mg/400 mL        2,000 mg 200 mL/hr over 120 Minutes Intravenous  Once 05/27/20 2246 05/28/20 0346   05/27/20 2215  ceFEPIme (  MAXIPIME) 2 g in sodium chloride 0.9 % 100 mL IVPB        2 g 200 mL/hr over 30 Minutes Intravenous  Once 05/27/20 2214 05/28/20 0001   05/27/20 2215  metroNIDAZOLE (FLAGYL) IVPB 500 mg        500 mg 100 mL/hr over 60 Minutes Intravenous  Once 05/27/20 2214 05/28/20 0115   05/27/20 2215  vancomycin (VANCOREADY) IVPB 1000 mg/200 mL  Status:  Discontinued        1,000 mg 200 mL/hr over 60 Minutes Intravenous  Once 05/27/20 2214 05/27/20 2219     Culture/Microbiology    Component Value Date/Time   SDES TRACHEAL ASPIRATE 05/28/2020 [place] 05/28/2020 [place] PENDING 05/28/2020 0953   REPTSTATUS PENDING 05/28/2020 0953    Other culture-see note  Objective: Vitals: Today's Vitals   05/28/20 2052 05/28/20 2100 05/28/20 2346 05/29/20 0331  BP: (!) 123/58  133/80 103/84  Pulse: 77  95 92  Resp: 17  20 20   Temp: 100.1 F (37.8 C)  99.6 F (37.6 C) 99.6 F (37.6 C)  TempSrc: Oral  Oral Oral  SpO2: 100%  100% 100%  Weight:      Height:      PainSc:  0-No pain      Intake/Output Summary (Last 24 hours) at 05/29/2020 0731 Last data filed at 05/29/2020 0709 Gross per 24 hour   Intake 469.38 ml  Output --  Net 469.38 ml   Filed Weights   05/27/20 2151 05/28/20 0308  Weight: 129.3 kg 129.3 kg   Weight change:   Intake/Output from previous day: 04/11 0701 - 04/12 0700 In: 200 [IV Piggyback:200] Out: -  Intake/Output this shift: Total I/O In: 269.4 [IV Piggyback:269.4] Out: -  Filed Weights   05/27/20 2151 05/28/20 0308  Weight: 129.3 kg 129.3 kg    Examination: General exam: AAOx3, obese,NAD, weak appearing. HEENT:Oral mucosa moist, Ear/Nose WNL grossly,dentition normal. Respiratory system: bilaterally diminished,no use of accessory muscle, non tender.  Tracheostomy in place.  Passy-Muir valve +, able to speak with Cardiovascular system: S1 & S2 +, regular, No JVD. Gastrointestinal system: Abdomen soft, NT,ND, BS+. Nervous System:Alert, awake, moving extremities and grossly nonfocal Extremities: No edema, distal peripheral pulses palpable.  Skin: No rashes,no icterus. MSK: Normal muscle bulk,tone, power  Data Reviewed: I have personally reviewed following labs and imaging studies CBC: Recent Labs  Lab 05/27/20 2201 05/28/20 0602 05/29/20 0321  WBC 18.6* 14.0* 11.6*  NEUTROABS 17.3* 12.9* 9.9*  HGB 13.1 12.7* 11.6*  HCT 40.0 39.3 34.9*  MCV 92.0 93.8 92.1  PLT 207 214 124   Basic Metabolic Panel: Recent Labs  Lab 05/27/20 2201 05/28/20 0602 05/29/20 0321  NA 134* 135 133*  K 3.7 3.8 3.5  CL 101 104 101  CO2 23 25 23   GLUCOSE 128* 101* 108*  BUN 7 6 8   CREATININE 1.35* 1.27* 1.12  CALCIUM 8.8* 8.5* 8.4*   GFR: Estimated Creatinine Clearance: 102.9 mL/min (by C-G formula based on SCr of 1.12 mg/dL). Liver Function Tests: Recent Labs  Lab 05/27/20 2201 05/28/20 0602  AST 33 31  ALT 57* 54*  ALKPHOS 52 55  BILITOT 1.8* 1.3*  PROT 6.5 6.3*  ALBUMIN 3.1* 3.0*   No results for input(s): LIPASE, AMYLASE in the last 168 hours. No results for input(s): AMMONIA in the last 168 hours. Coagulation Profile: Recent Labs  Lab  05/27/20 2235  INR 1.3*   Cardiac Enzymes: No results for input(s):  CKTOTAL, CKMB, CKMBINDEX, TROPONINI in the last 168 hours. BNP (last 3 results) No results for input(s): PROBNP in the last 8760 hours. HbA1C: No results for input(s): HGBA1C in the last 72 hours. CBG: Recent Labs  Lab 05/24/20 1021 05/28/20 2057 05/29/20 0700  GLUCAP 100* 100* 103*   Lipid Profile: No results for input(s): CHOL, HDL, LDLCALC, TRIG, CHOLHDL, LDLDIRECT in the last 72 hours. Thyroid Function Tests: No results for input(s): TSH, T4TOTAL, FREET4, T3FREE, THYROIDAB in the last 72 hours. Anemia Panel: No results for input(s): VITAMINB12, FOLATE, FERRITIN, TIBC, IRON, RETICCTPCT in the last 72 hours. Sepsis Labs: Recent Labs  Lab 05/27/20 2201 05/27/20 2230  LATICACIDVEN 1.2 1.1    Recent Results (from the past 240 hour(s))  SARS CORONAVIRUS 2 (TAT 6-24 HRS) Nasopharyngeal Nasopharyngeal Swab     Status: None   Collection Time: 05/21/20  1:31 PM   Specimen: Nasopharyngeal Swab  Result Value Ref Range Status   SARS Coronavirus 2 NEGATIVE NEGATIVE Final    Comment: (NOTE) SARS-CoV-2 target nucleic acids are NOT DETECTED.  The SARS-CoV-2 RNA is generally detectable in upper and lower respiratory specimens during the acute phase of infection. Negative results do not preclude SARS-CoV-2 infection, do not rule out co-infections with other pathogens, and should not be used as the sole basis for treatment or other patient management decisions. Negative results must be combined with clinical observations, patient history, and epidemiological information. The expected result is Negative.  Fact Sheet for Patients: SugarRoll.be  Fact Sheet for Healthcare Providers: https://www.woods-mathews.com/  This test is not yet approved or cleared by the [place] FDA and  has been authorized for detection and/or diagnosis of SARS-CoV-2 by FDA under an Emergency  Use Authorization (EUA). This EUA will remain  in effect (meaning this test can be used) for the duration of the COVID-19 declaration under Se ction 564(b)(1) of the Act, 21 U.S.C. section 360bbb-3(b)(1), unless the authorization is terminated or revoked sooner.  Performed at [facility], [place] [address] [place], [place] [address]   Blood Culture (routine x 2)     Status: None (Preliminary result)   Collection Time: 05/27/20 10:01 PM   Specimen: BLOOD RIGHT HAND  Result Value Ref Range Status   Specimen Description BLOOD RIGHT HAND  Final   Special Requests   Final    BOTTLES DRAWN AEROBIC AND ANAEROBIC Blood Culture results may not be optimal due to an inadequate volume of blood received in culture bottles   Culture   Final    NO GROWTH < 12 HOURS Performed at [facility], [place] [address] [place], [place] [address]    Report Status PENDING  Incomplete  Blood Culture (routine x 2)     Status: None (Preliminary result)   Collection Time: 05/27/20 10:34 PM   Specimen: BLOOD RIGHT HAND  Result Value Ref Range Status   Specimen Description BLOOD RIGHT HAND  Final   Special Requests   Final    BOTTLES DRAWN AEROBIC AND ANAEROBIC Blood Culture results may not be optimal due to an inadequate volume of blood received in culture bottles   Culture   Final    NO GROWTH < 12 HOURS Performed at [facility], [place] [address], [place], [place] [address]    Report Status PENDING  Incomplete  Resp Panel by RT-PCR (Flu A&B, Covid) Nasopharyngeal Swab     Status: None   Collection Time: 05/27/20 11:40 PM   Specimen: Nasopharyngeal Swab; Nasopharyngeal(NP)  swabs in vial transport medium  Result Value Ref Range Status   SARS Coronavirus 2 by RT PCR NEGATIVE NEGATIVE Final    Comment: (NOTE) SARS-CoV-2 target nucleic acids are NOT DETECTED.  The SARS-CoV-2 RNA is generally detectable in upper respiratory specimens during the acute phase of infection. The lowest concentration  of SARS-CoV-2 viral copies this assay can detect is 138 copies/mL. A negative result does not preclude SARS-Cov-2 infection and should not be used as the sole basis for treatment or other patient management decisions. A negative result may occur with  improper specimen collection/handling, submission of specimen other than nasopharyngeal swab, presence of viral mutation(s) within the areas targeted by this assay, and inadequate number of viral copies(<138 copies/mL). A negative result must be combined with clinical observations, patient history, and epidemiological information. The expected result is Negative.  Fact Sheet for Patients:  EntrepreneurPulse.com.au  Fact Sheet for Healthcare Providers:  IncredibleEmployment.be  This test is no t yet approved or cleared by the [place] FDA and  has been authorized for detection and/or diagnosis of SARS-CoV-2 by FDA under an Emergency Use Authorization (EUA). This EUA will remain  in effect (meaning this test can be used) for the duration of the COVID-19 declaration under Section 564(b)(1) of the Act, 21 U.S.C.section 360bbb-3(b)(1), unless the authorization is terminated  or revoked sooner.       Influenza A by PCR NEGATIVE NEGATIVE Final   Influenza B by PCR NEGATIVE NEGATIVE Final    Comment: (NOTE) The Xpert Xpress SARS-CoV-2/FLU/RSV plus assay is intended as an aid in the diagnosis of influenza from Nasopharyngeal swab specimens and should not be used as a sole basis for treatment. Nasal washings and aspirates are unacceptable for Xpert Xpress SARS-CoV-2/FLU/RSV testing.  Fact Sheet for Patients: EntrepreneurPulse.com.au  Fact Sheet for Healthcare Providers: IncredibleEmployment.be  This test is not yet approved or cleared by the [place] FDA and has been authorized for detection and/or diagnosis of SARS-CoV-2 by FDA under an Emergency Use  Authorization (EUA). This EUA will remain in effect (meaning this test can be used) for the duration of the COVID-19 declaration under Section 564(b)(1) of the Act, 21 U.S.C. section 360bbb-3(b)(1), unless the authorization is terminated or revoked.  Performed at [facility], [place] [address], [place], [place] [address]   Culture, Respiratory w Gram Stain     Status: None (Preliminary result)   Collection Time: 05/28/20  9:53 AM   Specimen: Tracheal Aspirate; Respiratory  Result Value Ref Range Status   Specimen Description TRACHEAL ASPIRATE  Final   Special Requests NONE  Final   Gram Stain   Final    RARE WBC PRESENT, PREDOMINANTLY PMN RARE SQUAMOUS EPITHELIAL CELLS PRESENT ABUNDANT GRAM POSITIVE COCCI ABUNDANT GRAM NEGATIVE RODS MODERATE GRAM POSITIVE RODS Performed at [facility], [place] [address] [place], [place] [address]    Culture PENDING  Incomplete   Report Status PENDING  Incomplete  Respiratory (~20 pathogens) panel by PCR     Status: None   Collection Time: 05/28/20 10:15 AM   Specimen: Tracheal Aspirate; Respiratory  Result Value Ref Range Status   Adenovirus NOT DETECTED NOT DETECTED Final   Coronavirus 229E NOT DETECTED NOT DETECTED Final    Comment: (NOTE) The Coronavirus on the Respiratory Panel, DOES NOT test for the novel  Coronavirus (2019 nCoV)    Coronavirus HKU1 NOT DETECTED NOT DETECTED Final   Coronavirus NL63 NOT DETECTED NOT DETECTED Final   Coronavirus OC43 NOT DETECTED  NOT DETECTED Final   Metapneumovirus NOT DETECTED NOT DETECTED Final   Rhinovirus / Enterovirus NOT DETECTED NOT DETECTED Final   Influenza A NOT DETECTED NOT DETECTED Final   Influenza B NOT DETECTED NOT DETECTED Final   Parainfluenza Virus 1 NOT DETECTED NOT DETECTED Final   Parainfluenza Virus 2 NOT DETECTED NOT DETECTED Final   Parainfluenza Virus 3 NOT DETECTED NOT DETECTED Final   Parainfluenza Virus 4 NOT DETECTED NOT DETECTED Final   Respiratory Syncytial  Virus NOT DETECTED NOT DETECTED Final   Bordetella pertussis NOT DETECTED NOT DETECTED Final   Bordetella Parapertussis NOT DETECTED NOT DETECTED Final   Chlamydophila pneumoniae NOT DETECTED NOT DETECTED Final   Mycoplasma pneumoniae NOT DETECTED NOT DETECTED Final    Comment: Performed at [facility], [place] [address], [place], [place] [address]  MRSA PCR Screening     Status: None   Collection Time: 05/28/20  4:52 PM   Specimen: Nasal Mucosa; Nasopharyngeal  Result Value Ref Range Status   MRSA by PCR NEGATIVE NEGATIVE Final    Comment:        The GeneXpert MRSA Assay (FDA approved for NASAL specimens only), is one component of a comprehensive MRSA colonization surveillance program. It is not intended to diagnose MRSA infection nor to guide or monitor treatment for MRSA infections. Performed at [facility], [place] [address], [place], [place] [address]      Radiology Studies: DG Orthopantogram  Result Date: 05/28/2020 CLINICAL DATA:  Tongue cancer EXAM: ORTHOPANTOGRAM/PANORAMIC COMPARISON:  None. FINDINGS: Panorex view of the mandible was performed. There are no acute or destructive bony lesions. Visualized soft tissues are unremarkable. IMPRESSION: 1. No acute or destructive bony lesions. Electronically Signed   By: Randa Ngo M.D.   On: 05/28/2020 17:18   DG Chest 2 View  Result Date: 05/29/2020 CLINICAL DATA:  60 year old male with fever. EXAM: CHEST - 2 VIEW COMPARISON:  Chest radiographs 05/27/2020 and earlier. FINDINGS: PA and lateral views today. Tracheostomy appears stable and satisfactory. Normal cardiac size and mediastinal contours. Larger lung volumes. Mild new streaky opacity at both lung bases most resembles atelectasis. No pneumothorax, pulmonary edema, pleural effusion or consolidation. No acute osseous abnormality identified. Negative visible bowel gas pattern. IMPRESSION: Larger lung volumes with mild bibasilar streaky opacity that more resembles  atelectasis than infection. No pleural effusion. Electronically Signed   By: Genevie Ann M.D.   On: 05/29/2020 07:18   DG Chest 2 View  Result Date: 05/28/2020 CLINICAL DATA:  Fever. EXAM: CHEST - 2 VIEW COMPARISON:  May 10, 2020 FINDINGS: Stable tracheostomy tube positioning is noted. The heart size and mediastinal contours are within normal limits. Both lungs are clear. The questionable tiny pulmonary nodule seen overlying the upper left lung on the prior study is not visualized on the current exam. The visualized skeletal structures are unremarkable. IMPRESSION: No active cardiopulmonary disease. Electronically Signed   By: Virgina Norfolk M.D.   On: 05/28/2020 00:04   CT Soft Tissue Neck W Contrast  Result Date: 05/27/2020 CLINICAL DATA:  Fever and sore throat. 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 EXAM: CT NECK WITH CONTRAST TECHNIQUE: Multidetector CT imaging of the neck was performed using the standard protocol following the bolus administration of intravenous contrast. CONTRAST:  67mL OMNIPAQUE IOHEXOL 300 MG/ML  SOLN COMPARISON:  CT neck 04/30/2020 FINDINGS: Pharynx and larynx: Mass of the left tongue base is unchanged compared 2 05/24/2020. The pharyngeal airway remains deviated to the  right. Mild thickening of the epiglottis is unchanged. Salivary glands: Parotid and submandibular glands are normal. Thyroid: Normal. Lymph nodes: Unchanged size of left level 2 lymph node measuring 2.9 cm (3:71). Unchanged 1.8 cm right level 2A node (3:57). Unchanged right level 3 node measuring 1.5 cm (3:85). Unchanged 10 mm right level 3 node (3:73). Vascular: Unremarkable Limited intracranial: Normal Visualized orbits: Normal Mastoids and visualized paranasal sinuses: Clear. Skeleton: No acute or aggressive process. Upper chest: Tracheostomy tube tip is at the level of the clavicular heads. Lung apices are clear. Other: None IMPRESSION: 1. Unchanged appearance of mass of the left tongue base and  bilateral cervical lymphadenopathy. 2. Tracheostomy tube tip is at the level of the clavicular heads. Electronically Signed   By: Ulyses Jarred M.D.   On: 05/27/2020 23:40     LOS: 0 days   Antonieta Pert, MD [facility]  05/29/2020, 7:31 AM

## 2020-05-30 ENCOUNTER — Ambulatory Visit: Payer: PRIVATE HEALTH INSURANCE | Admitting: Hematology and Oncology

## 2020-05-30 ENCOUNTER — Telehealth: Payer: Self-pay

## 2020-05-30 LAB — CBC WITH DIFFERENTIAL/PLATELET
Abs Immature Granulocytes: 0.06 10*3/uL (ref 0.00–0.07)
Basophils Absolute: 0 10*3/uL (ref 0.0–0.1)
Basophils Relative: 0 %
Eosinophils Absolute: 0.1 10*3/uL (ref 0.0–0.5)
Eosinophils Relative: 1 %
HCT: 35.9 % — ABNORMAL LOW (ref 39.0–52.0)
Hemoglobin: 12.2 g/dL — ABNORMAL LOW (ref 13.0–17.0)
Immature Granulocytes: 1 %
Lymphocytes Relative: 6 %
Lymphs Abs: 0.7 10*3/uL (ref 0.7–4.0)
MCH: 30.3 pg (ref 26.0–34.0)
MCHC: 34 g/dL (ref 30.0–36.0)
MCV: 89.1 fL (ref 80.0–100.0)
Monocytes Absolute: 1.1 10*3/uL — ABNORMAL HIGH (ref 0.1–1.0)
Monocytes Relative: 10 %
Neutro Abs: 9.1 10*3/uL — ABNORMAL HIGH (ref 1.7–7.7)
Neutrophils Relative %: 82 %
Platelets: 185 10*3/uL (ref 150–400)
RBC: 4.03 MIL/uL — ABNORMAL LOW (ref 4.22–5.81)
RDW: 13.5 % (ref 11.5–15.5)
WBC: 11.1 10*3/uL — ABNORMAL HIGH (ref 4.0–10.5)
nRBC: 0 % (ref 0.0–0.2)

## 2020-05-30 LAB — URINE CULTURE: Culture: NO GROWTH

## 2020-05-30 LAB — CULTURE, RESPIRATORY W GRAM STAIN: Culture: NORMAL

## 2020-05-30 MED ORDER — AMOXICILLIN-POT CLAVULANATE 875-125 MG PO TABS: 1.0000 | ORAL_TABLET | Freq: Two times a day (BID) | ORAL | 0 refills | Status: AC

## 2020-05-30 NOTE — Discharge Summary (Signed)
Physician Discharge Summary  Alexander [name] ZDG:[id number] DOB: 01/01/1961 DOA: 05/27/2020  PCP: Pcp, No  Admit date: 05/27/2020 Discharge date: 05/30/2020  Admitted From: home Disposition:  home  Recommendations for Outpatient Follow-up:  1. Follow up with PCP, dentist, ENT and oncology ( appointment on Friday)- call office. 2. Please obtain BMP/CBC in one week 3. Please follow up on the following pending results:  Home Health:no  Equipment/Devices: none  Discharge Condition: Stable Code Status:   Code Status: Full Code Diet recommendation:  Diet Order            DIET DYS 3 Room service appropriate? Yes; Fluid consistency: Thin  Diet effective now                  Brief/Interim Summary: 60 year old male with asthma, GERD, sleep apnea not on CPAP, squamous cell carcinoma of the tongue, tracheostomy in place recently admitted and discharged several weeks ago due to throat cancer presents to the ED with fatigue, decreased appetite, fever of 102.9. As per report at this tracheostomy cough changed on Friday and has been having yellowish sputum cough past few days no wheezing hemoptysis chest pain palpitation diaphoresis orthopnea, diarrhea dysuria urinary frequency. ED Course:Initial vital signs were temperature103.3 F, pulse 115, respiration 20, BP 87/59 mmHg O2 sat 100% on room air. The patient received a 2600 mL LR bolus, cefepime, metronidazole and vancomycin.  Labwork:Urinalysis showed a specific gravity of 1.044 and ketonuria 5 mg/dL. The rest of the UA is unremarkable. CBC showed a white count of 18.6 with 93% neutrophils, hemoglobin 13.1 g/dL platelets 207. PT 15.7, INR 1.3 and PTT 35. Lactic acid was 1.2 and then 1.1 mmol/L. CMP showed a sodium 134 mmol/L, but all other electrolytes are normal point calcium is corrected to albumin. Glucose 128, BUN 7 and creatinine 1.35 mg/dL. His hepatic function tests showed an albumin level of 3.1 g/dL, ALT of 57 units/L and total  bilirubin of 1.8 mg/dL.  Imaging: 2 view chest radiograph did not show any active cardiopulmonary disease. CT soft tissue neck with contrast show unchanged appearance of mass of the left tongue base and bilateral cervical lymphadenopathy. Tracheostomy tube tip is at the level of the clavicular heads.   Patient was admitted for broad-spectrum antibiotics. Respiratory culture, urine culture and blood culture were drawn. Respiratory status is stable. Could be bronchitis, respiratory culture normal flora, no pneumonia no tracheal inflammation cellulitis. Patient stable wants to go home today is being discharged on oral Augmentin and will follow up with outpatient oncology.  Discharge Diagnoses:  Supsected sepsis POA due to undetermined organism/Unclear source.patient did meet sepsis parameters on admission, cultures negative blood culture and respiratory culture with normal flora.  Will discharge on oral Augmentin. Sepsis ruled out. No obvious UTI or pneumonia present.  Leukocytosis resolved has remained afebrile. Recent Labs  Lab 05/27/20 2201 05/27/20 2230 05/28/20 0602 05/29/20 0321 05/30/20 0328  WBC 18.6*  --  14.0* 11.6* 11.1*  LATICACIDVEN 1.2 1.1  --   --   --    Large base of tongue mass 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Tracheostomy dependence  PET scan 05/24/20: Large mass in the oropharynx extending into the supraglottic larynx bilateral cervical lymphadenopathy Marked hypermetabolic features subcarinal positive noeds- questionable areas of variable density in the liver potentially increased metabolic activity tah twill need ct w/ contrast.small pulmonary nodules nonspecific. ENT Dr [name] saw patient  4/12. Patient has oncology follow-up Altha Harm was notified and will likely plan for outpatient follow-up.  Follow-up with his dental  office. orthopantogram - no destructive bony lesion. Oncology has rescheduled appointment for coming Friday.  Volume depletion/hyponatremia: Received IV  fluids.  Sodium stable. Recent Labs  Lab 05/27/20 2201 05/28/20 0602 05/29/20 0321  NA 134* 135 133*   GERD continue PPI  Moderate protein calorie malnutrition dietitian on board augment nutrition  Hyperbilirubinemia-monitor.  Morbid obesity BMI more than 35 will benefit with weight loss outpatient PCP follow-up  Diet Order            DIET DYS 3 Room service appropriate? Yes; Fluid consistency: Thin  Diet effective now                 Nutrition Problem: Moderate Malnutrition Etiology: acute illness (squamos cell carcinoma of the tongue) Signs/Symptoms: mild muscle depletion,energy intake < or equal to 50% for > or equal to 5 days Interventions: Magic cup,Carnation Instant Breakfast Patient's Body mass index is 35.64 kg/m.  Consults:  ent  Subjective: Aaox3, wants to go home now. Wife at bedside No fever. couhg better.  Discharge Exam: Vitals:   05/30/20 0736 05/30/20 0825  BP: 133/70   Pulse: 87 81  Resp: 16 13  Temp: 98.8 F (37.1 C)   SpO2: 100% 99%   General: Pt is alert, awake, not in acute distress Cardiovascular: RRR, S1/S2 +, no rubs, no gallops Respiratory: CTA bilaterally, no wheezing, no rhonchi Abdominal: Soft, NT, ND, bowel sounds + Extremities: no edema, no cyanosis  Discharge Instructions  Discharge Instructions    Discharge instructions   Complete by: As directed    Please call call MD or return to ER for similar or worsening recurring problem that brought you to hospital or if any fever,nausea/vomiting,abdominal pain, uncontrolled pain, chest pain,  shortness of breath or any other alarming symptoms.  See your oncology coming Friday  Please follow-up your doctor as instructed in a week time and call the office for appointment.  Please avoid alcohol, smoking, or any other illicit substance and maintain healthy habits including taking your regular medications as prescribed.  You were cared for by a hospitalist during your hospital  stay. If you have any questions about your discharge medications or the care you received while you were in the hospital after you are discharged, you can call the unit and ask to speak with the hospitalist on call if the hospitalist that took care of you is not available.  Once you are discharged, your primary care physician will handle any further medical issues. Please note that NO REFILLS for any discharge medications will be authorized once you are discharged, as it is imperative that you return to your primary care physician (or establish a relationship with a primary care physician if you do not have one) for your aftercare needs so that they can reassess your need for medications and monitor your lab values   Increase activity slowly   Complete by: As directed      Allergies as of 05/30/2020   No Known Allergies     Medication List    TAKE these medications   albuterol 108 (90 Base) MCG/ACT inhaler Commonly known as: VENTOLIN HFA Inhale 2 puffs into the lungs as needed for wheezing or shortness of breath.   albuterol (2.5 MG/3ML) 0.083% nebulizer solution Commonly known as: PROVENTIL Take 3 mLs (2.5 mg total) by nebulization every 6 (six) hours as needed for wheezing or shortness of breath.   amoxicillin-clavulanate 875-125 MG tablet Commonly known as: Augmentin Take 1 tablet by mouth 2 (two) times  daily for 5 days.   bacitracin ointment Apply 1 application topically every 8 (eight) hours. Apply around trach wound   cetirizine 10 MG tablet Commonly known as: ZYRTEC Take 10 mg by mouth daily.   cetirizine 10 MG tablet Commonly known as: ZYRTEC Take 1 tablet (10 mg total) by mouth daily.   fluticasone 50 MCG/ACT nasal spray Commonly known as: FLONASE Place 2 sprays into both nostrils daily. Place 1 spray each nostril at night   Fluticasone-Salmeterol 100-50 MCG/DOSE Aepb Commonly known as: ADVAIR Inhale 1 puff into the lungs daily.   omeprazole 40 MG  capsule Commonly known as: PRILOSEC Take 40 mg by mouth daily.       Follow-up Information    Benay Pike, MD. Call in 1 day(s).   Specialty: Hematology and Oncology Contact information: [place] [address] [phone]              No Known Allergies  The results of significant diagnostics from this hospitalization (including imaging, microbiology, ancillary and laboratory) are listed below for reference.    Microbiology: Recent Results (from the past 240 hour(s))  SARS CORONAVIRUS 2 (TAT 6-24 HRS) Nasopharyngeal Nasopharyngeal Swab     Status: None   Collection Time: 05/21/20  1:31 PM   Specimen: Nasopharyngeal Swab  Result Value Ref Range Status   SARS Coronavirus 2 NEGATIVE NEGATIVE Final    Comment: (NOTE) SARS-CoV-2 target nucleic acids are NOT DETECTED.  The SARS-CoV-2 RNA is generally detectable in upper and lower respiratory specimens during the acute phase of infection. Negative results do not preclude SARS-CoV-2 infection, do not rule out co-infections with other pathogens, and should not be used as the sole basis for treatment or other patient management decisions. Negative results must be combined with clinical observations, patient history, and epidemiological information. The expected result is Negative.  Fact Sheet for Patients: SugarRoll.be  Fact Sheet for Healthcare Providers: https://www.woods-mathews.com/  This test is not yet approved or cleared by the [place] FDA and  has been authorized for detection and/or diagnosis of SARS-CoV-2 by FDA under an Emergency Use Authorization (EUA). This EUA will remain  in effect (meaning this test can be used) for the duration of the COVID-19 declaration under Se ction 564(b)(1) of the Act, 21 U.S.C. section 360bbb-3(b)(1), unless the authorization is terminated or revoked sooner.  Performed at [facility], [place] [address]  [place], [place] [address]   Blood Culture (routine x 2)     Status: None (Preliminary result)   Collection Time: 05/27/20 10:01 PM   Specimen: BLOOD RIGHT HAND  Result Value Ref Range Status   Specimen Description BLOOD RIGHT HAND  Final   Special Requests   Final    BOTTLES DRAWN AEROBIC AND ANAEROBIC Blood Culture results may not be optimal due to an inadequate volume of blood received in culture bottles   Culture   Final    NO GROWTH 3 DAYS Performed at [facility], [place] [address] [place],  [address]    Report Status PENDING  Incomplete  Blood Culture (routine x 2)     Status: None (Preliminary result)   Collection Time: 05/27/20 10:34 PM   Specimen: BLOOD RIGHT HAND  Result Value Ref Range Status   Specimen Description BLOOD RIGHT HAND  Final   Special Requests   Final    BOTTLES DRAWN AEROBIC AND ANAEROBIC Blood Culture results may not be optimal due to an inadequate volume of blood received  in culture bottles   Culture   Final    NO GROWTH 3 DAYS Performed at [facility], [place] [address], [place], [place] [address]    Report Status PENDING  Incomplete  Resp Panel by RT-PCR (Flu A&B, Covid) Nasopharyngeal Swab     Status: None   Collection Time: 05/27/20 11:40 PM   Specimen: Nasopharyngeal Swab; Nasopharyngeal(NP) swabs in vial transport medium  Result Value Ref Range Status   SARS Coronavirus 2 by RT PCR NEGATIVE NEGATIVE Final    Comment: (NOTE) SARS-CoV-2 target nucleic acids are NOT DETECTED.  The SARS-CoV-2 RNA is generally detectable in upper respiratory specimens during the acute phase of infection. The lowest concentration of SARS-CoV-2 viral copies this assay can detect is 138 copies/mL. A negative result does not preclude SARS-Cov-2 infection and should not be used as the sole basis for treatment or other patient management decisions. A negative result may occur with  improper specimen collection/handling, submission of specimen other than  nasopharyngeal swab, presence of viral mutation(s) within the areas targeted by this assay, and inadequate number of viral copies(<138 copies/mL). A negative result must be combined with clinical observations, patient history, and epidemiological information. The expected result is Negative.  Fact Sheet for Patients:  EntrepreneurPulse.com.au  Fact Sheet for Healthcare Providers:  IncredibleEmployment.be  This test is no t yet approved or cleared by the [place] FDA and  has been authorized for detection and/or diagnosis of SARS-CoV-2 by FDA under an Emergency Use Authorization (EUA). This EUA will remain  in effect (meaning this test can be used) for the duration of the COVID-19 declaration under Section 564(b)(1) of the Act, 21 U.S.C.section 360bbb-3(b)(1), unless the authorization is terminated  or revoked sooner.       Influenza A by PCR NEGATIVE NEGATIVE Final   Influenza B by PCR NEGATIVE NEGATIVE Final    Comment: (NOTE) The Xpert Xpress SARS-CoV-2/FLU/RSV plus assay is intended as an aid in the diagnosis of influenza from Nasopharyngeal swab specimens and should not be used as a sole basis for treatment. Nasal washings and aspirates are unacceptable for Xpert Xpress SARS-CoV-2/FLU/RSV testing.  Fact Sheet for Patients: EntrepreneurPulse.com.au  Fact Sheet for Healthcare Providers: IncredibleEmployment.be  This test is not yet approved or cleared by the [place] FDA and has been authorized for detection and/or diagnosis of SARS-CoV-2 by FDA under an Emergency Use Authorization (EUA). This EUA will remain in effect (meaning this test can be used) for the duration of the COVID-19 declaration under Section 564(b)(1) of the Act, 21 U.S.C. section 360bbb-3(b)(1), unless the authorization is terminated or revoked.  Performed at [facility], [place] [address], [place], [place] [address]    Urine culture     Status: None   Collection Time: 05/28/20  1:29 AM   Specimen: In/Out Cath Urine  Result Value Ref Range Status   Specimen Description IN/OUT CATH URINE  Final   Special Requests NONE  Final   Culture   Final    NO GROWTH Performed at [facility], [place] [address] [place], [place] [address]    Report Status 05/30/2020 FINAL  Final  Culture, Respiratory w Gram Stain     Status: None   Collection Time: 05/28/20  9:53 AM   Specimen: Tracheal Aspirate; Respiratory  Result Value Ref Range Status   Specimen Description TRACHEAL ASPIRATE  Final   Special Requests NONE  Final   Gram Stain   Final    RARE WBC  PRESENT, PREDOMINANTLY PMN RARE SQUAMOUS EPITHELIAL CELLS PRESENT ABUNDANT GRAM POSITIVE COCCI ABUNDANT GRAM NEGATIVE RODS MODERATE GRAM POSITIVE RODS    Culture   Final    Consistent with normal respiratory flora. No Pseudomonas species isolated Performed at [place] [address], [place], [place] [address]    Report Status 05/30/2020 FINAL  Final  Respiratory (~20 pathogens) panel by PCR     Status: None   Collection Time: 05/28/20 10:15 AM   Specimen: Tracheal Aspirate; Respiratory  Result Value Ref Range Status   Adenovirus NOT DETECTED NOT DETECTED Final   Coronavirus 229E NOT DETECTED NOT DETECTED Final    Comment: (NOTE) The Coronavirus on the Respiratory Panel, DOES NOT test for the novel  Coronavirus (2019 nCoV)    Coronavirus HKU1 NOT DETECTED NOT DETECTED Final   Coronavirus NL63 NOT DETECTED NOT DETECTED Final   Coronavirus OC43 NOT DETECTED NOT DETECTED Final   Metapneumovirus NOT DETECTED NOT DETECTED Final   Rhinovirus / Enterovirus NOT DETECTED NOT DETECTED Final   Influenza A NOT DETECTED NOT DETECTED Final   Influenza B NOT DETECTED NOT DETECTED Final   Parainfluenza Virus 1 NOT DETECTED NOT DETECTED Final   Parainfluenza Virus 2 NOT DETECTED NOT DETECTED Final   Parainfluenza Virus 3 NOT DETECTED NOT DETECTED Final    Parainfluenza Virus 4 NOT DETECTED NOT DETECTED Final   Respiratory Syncytial Virus NOT DETECTED NOT DETECTED Final   Bordetella pertussis NOT DETECTED NOT DETECTED Final   Bordetella Parapertussis NOT DETECTED NOT DETECTED Final   Chlamydophila pneumoniae NOT DETECTED NOT DETECTED Final   Mycoplasma pneumoniae NOT DETECTED NOT DETECTED Final    Comment: Performed at [facility], [place], [place], [place] [address]  MRSA PCR Screening     Status: None   Collection Time: 05/28/20  4:52 PM   Specimen: Nasal Mucosa; Nasopharyngeal  Result Value Ref Range Status   MRSA by PCR NEGATIVE NEGATIVE Final    Comment:        The GeneXpert MRSA Assay (FDA approved for NASAL specimens only), is one component of a comprehensive MRSA colonization surveillance program. It is not intended to diagnose MRSA infection nor to guide or monitor treatment for MRSA infections. Performed at [facility], [place] [address], [place], [place] [address]     Procedures/Studies: [place] Orthopantogram  Result Date: 05/28/2020 CLINICAL DATA:  Tongue cancer EXAM: ORTHOPANTOGRAM/PANORAMIC COMPARISON:  None. FINDINGS: Panorex view of the mandible was performed. There are no acute or destructive bony lesions. Visualized soft tissues are unremarkable. IMPRESSION: 1. No acute or destructive bony lesions. Electronically Signed   By: Randa Ngo M.D.   On: 05/28/2020 17:18   DG Chest 2 View  Result Date: 05/29/2020 CLINICAL DATA:  60 year old male with fever. EXAM: CHEST - 2 VIEW COMPARISON:  Chest radiographs 05/27/2020 and earlier. FINDINGS: PA and lateral views today. Tracheostomy appears stable and satisfactory. Normal cardiac size and mediastinal contours. Larger lung volumes. Mild new streaky opacity at both lung bases most resembles atelectasis. No pneumothorax, pulmonary edema, pleural effusion or consolidation. No acute osseous abnormality identified. Negative visible bowel gas pattern. IMPRESSION:  Larger lung volumes with mild bibasilar streaky opacity that more resembles atelectasis than infection. No pleural effusion. Electronically Signed   By: Genevie Ann M.D.   On: 05/29/2020 07:18   DG Chest 2 View  Result Date: 05/28/2020 CLINICAL DATA:  Fever. EXAM: CHEST - 2 VIEW COMPARISON:  May 10, 2020 FINDINGS: Stable tracheostomy  tube positioning is noted. The heart size and mediastinal contours are within normal limits. Both lungs are clear. The questionable tiny pulmonary nodule seen overlying the upper left lung on the prior study is not visualized on the current exam. The visualized skeletal structures are unremarkable. IMPRESSION: No active cardiopulmonary disease. Electronically Signed   By: Virgina Norfolk M.D.   On: 05/28/2020 00:04   CT Soft Tissue Neck W Contrast  Result Date: 05/27/2020 CLINICAL DATA:  Fever and sore throat. 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 EXAM: CT NECK WITH CONTRAST TECHNIQUE: Multidetector CT imaging of the neck was performed using the standard protocol following the bolus administration of intravenous contrast. CONTRAST:  82mL OMNIPAQUE IOHEXOL 300 MG/ML  SOLN COMPARISON:  CT neck 04/30/2020 FINDINGS: Pharynx and larynx: Mass of the left tongue base is unchanged compared 2 05/24/2020. The pharyngeal airway remains deviated to the right. Mild thickening of the epiglottis is unchanged. Salivary glands: Parotid and submandibular glands are normal. Thyroid: Normal. Lymph nodes: Unchanged size of left level 2 lymph node measuring 2.9 cm (3:71). Unchanged 1.8 cm right level 2A node (3:57). Unchanged right level 3 node measuring 1.5 cm (3:85). Unchanged 10 mm right level 3 node (3:73). Vascular: Unremarkable Limited intracranial: Normal Visualized orbits: Normal Mastoids and visualized paranasal sinuses: Clear. Skeleton: No acute or aggressive process. Upper chest: Tracheostomy tube tip is at the level of the clavicular heads. Lung apices are clear. Other:  None IMPRESSION: 1. Unchanged appearance of mass of the left tongue base and bilateral cervical lymphadenopathy. 2. Tracheostomy tube tip is at the level of the clavicular heads. Electronically Signed   By: Ulyses Jarred M.D.   On: 05/27/2020 23:40   CT Soft Tissue Neck W Contrast  Result Date: 04/30/2020 CLINICAL DATA:  Soft tissue mass, neck, no prior imaging. EXAM: CT NECK WITH CONTRAST TECHNIQUE: Multidetector CT imaging of the neck was performed using the standard protocol following the bolus administration of intravenous contrast. CONTRAST:  93mL OMNIPAQUE IOHEXOL 300 MG/ML  SOLN COMPARISON:  No pertinent prior exams available for comparison. FINDINGS: Pharynx and larynx: There is a large soft tissue mass centered in the region of the left tongue base which also extends posteriorly to the glossotonsillar sulcus and vallecula. The mass has poorly defined margins appears to cross midline and is estimated to measure at least 5.0 x 4.7 x 3.4 cm. The epiglottis and aryepiglottic folds are not well separately delineated and may be involved by the mass. Suspect tumor extension inferiorly at least to the level of the supraglottic larynx. Apparent tumor extension on the right into the strap muscles at the level of the supraglottic larynx (series 3, image 89). Infiltrated appearance of the pre epiglottic fat. There is a more subtly infiltrated appearance of the paraglottic fat. Additionally, there is asymmetric prominence of the left piriformis sinus suspicious for left vocal cord palsy. There is moderate/severe effacement of the oropharyngeal airway. Nonspecific edema within the floor of mouth/submandibular space. Salivary glands: No inflammation, mass, or stone. Thyroid: Unremarkable. Lymph nodes: Extensive bilateral cervical lymphadenopathy compatible with nodal metastatic disease. Some of the lymph nodes demonstrate abnormal internal low-density. Most notably, there is a bulky node/nodal conglomerate at the left  level 2/3 stations measuring 6.2 x 3.4 cm (series 5, image 81). This lymph node has poorly defined margins concerning for extracapsular extension. Additional lymphadenopathy at the left level 2, 3 and 5 stations. An enlarged right level 2 lymph node measures 4.9 x 2.5 cm in transaxial  dimensions. Additional lymphadenopathy at the right 2 and 3 stations. For instance, an enlarged right level 3 lymph node measures 1.7 cm in short axis. Vascular: The major vascular structures of the neck are patent. The mid left internal jugular vein is markedly narrowed by left level 2/3 lymphadenopathy. Limited intracranial: No acute intracranial abnormality is identified. Visualized orbits: No mass or acute finding. Mastoids and visualized paranasal sinuses: Mild bilateral frontal sinus mucosal thickening. Extensive partial opacification of the bilateral ethmoid air cells. Mild right maxillary sinus mucosal thickening. Moderate left maxillary sinus mucosal thickening greatest inferiorly. Skeleton: Mild age-indeterminate C4 superior endplate vertebral compression deformity. Elsewhere, there is no acute bony abnormality or aggressive osseous lesion. Upper chest: No consolidation within the imaged lung apices. These results were called by telephone at the time of interpretation on 04/30/2020 at 2:38 pm to provider [facility] R[facility] , who verbally acknowledged these results. IMPRESSION: Large soft tissue mass centered in the region of the left tongue base also extending posteriorly to the glossotonsillar sulcus and vallecula. This portion of the mass has poorly defined margins but appears to cross midline and is estimated to measure at least 5.0 x 4.7 x 3.4 cm. The epiglottis and aryepiglottic folds are not well separately delineated and may be involved by the mass. Suspect tumor extension inferiorly at least to the level of the supraglottic larynx. There is also apparent tumor extension on the right into the strap muscles at the level of the  supraglottic larynx. Also of note, there is an infiltrated appearance of the pre-epiglottic fat and a more subtly infiltrated appearance of the paraglottic fat concerning for tumor infiltration. Furthermore, there is asymmetric prominence of the left piriformis sinus suspicious for left vocal cord palsy. ENT consultation is recommended. Results in moderate/severe effacement of the oropharyngeal airway. Nonspecific edema within the floor of mouth/submandibular space. 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 There is concern for extracapsular extension, as described. The mid left internal jugular vein is markedly effaced by lymphadenopathy. Paranasal sinus disease as described. Mild age-indeterminate T4 superior endplate vertebral compression deformity. Electronically Signed   By: Kellie Simmering DO   On: 04/30/2020 14:38   CT CHEST W CONTRAST  Result Date: 05/11/2020 CLINICAL DATA:  60 year old male with left tongue base mass. EXAM: CT CHEST WITH CONTRAST TECHNIQUE: Multidetector CT imaging of the chest was performed during intravenous contrast administration. CONTRAST:  72mL OMNIPAQUE IOHEXOL 300 MG/ML  SOLN COMPARISON:  Chest radiograph dated 05/10/2020 FINDINGS: Cardiovascular: There is no cardiomegaly or pericardial effusion. The thoracic aorta is unremarkable. The origins of the great vessels of the aortic arch appear patent as visualized. The central pulmonary arteries appear patent for the degree of opacification. Mediastinum/Nodes: Subcarinal mass/adenopathy measures 2.3 cm in short axis. No hilar adenopathy. The esophagus is grossly unremarkable. No mediastinal fluid collection. Small amount of fluid and air in the anterior mediastinum likely secondary to tracheostomy. Lungs/Pleura: There is shallow inspiration with bibasilar atelectasis. No pleural effusion or pneumothorax. The central airways are patent. A tracheostomy is noted with tip approximately 4.5 cm  above the carina. Upper Abdomen: Probable fatty liver. Musculoskeletal: Degenerative changes of the spine. No acute osseous pathology. IMPRESSION: 1. No acute intrathoracic pathology. 2. Subcarinal mass/adenopathy. 3. Low lung volumes with bibasilar atelectasis. Electronically Signed   By: Anner Crete M.D.   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 EXAM: NUCLEAR MEDICINE PET SKULL BASE TO THIGH TECHNIQUE: Fifteen mCi F-18 FDG was injected intravenously. Full-ring PET imaging was performed from the skull base to thigh after the radiotracer. CT data was obtained and used for attenuation correction and anatomic localization. Fasting blood glucose: 100 mg/dl COMPARISON:  Chest CT from May 11, 2020, CT neck of April 30, 2020 FINDINGS: Mediastinal blood pool activity: SUV max 3.42 Liver activity: SUV max NA NECK: Irregular appearing mass at the base of tongue and floor of the mouth extending inferiorly extending towards the epiglottis supraglottic larynx as outlined on recent CT of the neck displays a maximum SUV of 17.5. Large LEFT level II and III nodal disease measuring approximately 3 cm short axis grossly unchanged accounting for lack of contrast since previous imaging with a maximum SUV of 8.2 (image 30, series 3) Lymph nodes throughout both the LEFT and RIGHT neck display similar FDG uptake but are smaller, unchanged compared to recent CT of the neck. Posterior triangle lymph node on image 37 of series 3 with only a maximum SUV of 2.7 displays rounded morphology however and measures approximately 11 mm. Airways narrowed. Tracheostomy tube is in place. Incidental CT findings: Airway narrowing with tracheostomy tube as described. CHEST: Marked hypermetabolic subcarinal lymph node with a  maximum SUV of 9.9 on image 85 of series 3 measures 2.2 cm short axis, essentially stable compared to recent imaging. No additional hypermetabolic lymph nodes in the chest. Small nodule along the RIGHT mediastinal border not definitely seen on previous imaging measuring approximately 8 mm (image 110, series 3) no increased FDG uptake. Branching nodularity in the peripheral RIGHT chest on image 104 of series 3 measures approximately 8-9 mm. Not definitely seen on previous imaging. Lungs with improved aeration since the prior study. Airways are patent. Incidental CT findings: Tracheostomy tube in-situ. Resolution of subcutaneous emphysema seen on the prior study. Normal caliber thoracic aorta. No significant atherosclerotic changes. Normal heart size without pericardial effusion. Normal caliber central pulmonary vasculature. Limited assessment of cardiovascular structures given lack of intravenous contrast. ABDOMEN/PELVIS: Question of subtle low-density lesion in the LEFT hemi liver on image 135 of series 3. This may measure as much as 1.8 cm. Heterogeneous PET uptake in this area as the stomach crosses beneath the liver margin. There is an area of increased metabolic activity seen cephalad on image 133 with a maximum SUV of 5.8. Subtle variable attenuation on image 118 of series 3 in the posterior RIGHT hemiliver along the capsular margin measuring approximately 1.3 cm with a maximum SUV of 5.9 Overall again there is marked heterogeneity of hepatic activity. The CT portion of the exam with very limited assessment due to arm position. No additional sites of potential disease in the abdomen or in the pelvis Incidental CT findings: Liver with potential background steatosis. Gallbladder unremarkable grossly. No acute findings related to spleen, pancreas, adrenal glands and kidneys. No hydronephrosis. Collapse of the urinary bladder. No acute gastrointestinal findings. No adenopathy in the abdomen or pelvis by size  criteria. Fat containing umbilical hernia. SKELETON: No focal hypermetabolic activity to suggest skeletal metastasis. Incidental CT findings: none IMPRESSION: Large mass in the oropharynx extending into the supraglottic larynx associated with bilateral cervical adenopathy. Area showing marked hypermetabolic features as described. 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 Questionable areas of variable density in the liver, associated with potential increased metabolic activity. Consider contrasted CT  or MRI for further evaluation. Overall FDG uptake is heterogeneous within the liver. Small pulmonary nodules as outlined above, nonspecific. Suggest attention on follow-up. Not associated with increased metabolic activity. Ethmoid sinus disease. Electronically Signed   By: Zetta Bills M.D.   On: 05/25/2020 13:33   DG CHEST PORT 1 VIEW  Result Date: 05/10/2020 CLINICAL DATA:  Tracheostomy. EXAM: PORTABLE CHEST 1 VIEW COMPARISON:  No prior. FINDINGS: Tracheostomy noted in stable position. Heart size normal. Low lung volumes with mild bibasilar atelectasis. Questionable tiny nodule left upper lung. PA lateral chest x-ray suggested for further evaluation. Tiny bilateral pleural effusions cannot be excluded. No pneumothorax. IMPRESSION: 1. Tracheostomy tube in stable position. 2. Low lung volumes with mild bibasilar atelectasis. Tiny bilateral pleural effusions cannot be excluded. 3. Questionable tiny pulmonary nodule left upper lung. PA lateral chest x-ray suggested for further evaluation. Electronically Signed   By: Marcello Moores  Register   On: 05/10/2020 08:16    Labs: BNP (last 3 results) No results for input(s): BNP in the last 8760 hours. Basic Metabolic Panel: Recent Labs  Lab 05/27/20 2201 05/28/20 0602 05/29/20 0321  NA 134* 135 133*  K 3.7 3.8 3.5  CL 101 104 101  CO2 23 25 23   GLUCOSE 128* 101* 108*  BUN 7 6 8   CREATININE 1.35* 1.27* 1.12  CALCIUM 8.8* 8.5*  8.4*   Liver Function Tests: Recent Labs  Lab 05/27/20 2201 05/28/20 0602  AST 33 31  ALT 57* 54*  ALKPHOS 52 55  BILITOT 1.8* 1.3*  PROT 6.5 6.3*  ALBUMIN 3.1* 3.0*   No results for input(s): LIPASE, AMYLASE in the last 168 hours. No results for input(s): AMMONIA in the last 168 hours. CBC: Recent Labs  Lab 05/27/20 2201 05/28/20 0602 05/29/20 0321 05/30/20 0328  WBC 18.6* 14.0* 11.6* 11.1*  NEUTROABS 17.3* 12.9* 9.9* 9.1*  HGB 13.1 12.7* 11.6* 12.2*  HCT 40.0 39.3 34.9* 35.9*  MCV 92.0 93.8 92.1 89.1  PLT 207 214 172 185   Cardiac Enzymes: No results for input(s): CKTOTAL, CKMB, CKMBINDEX, TROPONINI in the last 168 hours. BNP: Invalid input(s): POCBNP CBG: Recent Labs  Lab 05/24/20 1021 05/28/20 2057 05/29/20 0700 05/29/20 1138  GLUCAP 100* 100* 103* 119*   D-Dimer No results for input(s): DDIMER in the last 72 hours. Hgb A1c No results for input(s): HGBA1C in the last 72 hours. Lipid Profile No results for input(s): CHOL, HDL, LDLCALC, TRIG, CHOLHDL, LDLDIRECT in the last 72 hours. Thyroid function studies No results for input(s): TSH, T4TOTAL, T3FREE, THYROIDAB in the last 72 hours.  Invalid input(s): FREET3 Anemia work up No results for input(s): VITAMINB12, FOLATE, FERRITIN, TIBC, IRON, RETICCTPCT in the last 72 hours. Urinalysis    Component Value Date/Time   COLORURINE YELLOW 05/28/2020 0129   APPEARANCEUR CLEAR 05/28/2020 0129   LABSPEC 1.044 (H) 05/28/2020 0129   PHURINE 5.0 05/28/2020 0129   GLUCOSEU NEGATIVE 05/28/2020 0129   HGBUR NEGATIVE 05/28/2020 0129   BILIRUBINUR NEGATIVE 05/28/2020 0129   KETONESUR 5 (A) 05/28/2020 0129   PROTEINUR NEGATIVE 05/28/2020 0129   NITRITE NEGATIVE 05/28/2020 0129   LEUKOCYTESUR NEGATIVE 05/28/2020 0129   Sepsis Labs Invalid input(s): PROCALCITONIN,  WBC,  LACTICIDVEN Microbiology Recent Results (from the past 240 hour(s))  SARS CORONAVIRUS 2 (TAT 6-24 HRS) Nasopharyngeal Nasopharyngeal Swab      Status: None   Collection Time: 05/21/20  1:31 PM   Specimen: Nasopharyngeal Swab  Result Value Ref Range Status   SARS Coronavirus 2 NEGATIVE NEGATIVE Final  Comment: (NOTE) SARS-CoV-2 target nucleic acids are NOT DETECTED.  The SARS-CoV-2 RNA is generally detectable in upper and lower respiratory specimens during the acute phase of infection. Negative results do not preclude SARS-CoV-2 infection, do not rule out co-infections with other pathogens, and should not be used as the sole basis for treatment or other patient management decisions. Negative results must be combined with clinical observations, patient history, and epidemiological information. The expected result is Negative.  Fact Sheet for Patients: SugarRoll.be  Fact Sheet for Healthcare Providers: https://www.woods-mathews.com/  This test is not yet approved or cleared by the [place] FDA and  has been authorized for detection and/or diagnosis of SARS-CoV-2 by FDA under an Emergency Use Authorization (EUA). This EUA will remain  in effect (meaning this test can be used) for the duration of the COVID-19 declaration under Se ction 564(b)(1) of the Act, 21 U.S.C. section 360bbb-3(b)(1), unless the authorization is terminated or revoked sooner.  Performed at [facility], [place] [address], [place], [place] [address]   Blood Culture (routine x 2)     Status: None (Preliminary result)   Collection Time: 05/27/20 10:01 PM   Specimen: BLOOD RIGHT HAND  Result Value Ref Range Status   Specimen Description BLOOD RIGHT HAND  Final   Special Requests   Final    BOTTLES DRAWN AEROBIC AND ANAEROBIC Blood Culture results may not be optimal due to an inadequate volume of blood received in culture bottles   Culture   Final    NO GROWTH 3 DAYS Performed at [facility], [place] [address], [place], [place] [address]    Report Status PENDING  Incomplete  Blood Culture  (routine x 2)     Status: None (Preliminary result)   Collection Time: 05/27/20 10:34 PM   Specimen: BLOOD RIGHT HAND  Result Value Ref Range Status   Specimen Description BLOOD RIGHT HAND  Final   Special Requests   Final    BOTTLES DRAWN AEROBIC AND ANAEROBIC Blood Culture results may not be optimal due to an inadequate volume of blood received in culture bottles   Culture   Final    NO GROWTH 3 DAYS Performed at [facility], [place] [address] [place], [place] [address]    Report Status PENDING  Incomplete  Resp Panel by RT-PCR (Flu A&B, Covid) Nasopharyngeal Swab     Status: None   Collection Time: 05/27/20 11:40 PM   Specimen: Nasopharyngeal Swab; Nasopharyngeal(NP) swabs in vial transport medium  Result Value Ref Range Status   SARS Coronavirus 2 by RT PCR NEGATIVE NEGATIVE Final    Comment: (NOTE) SARS-CoV-2 target nucleic acids are NOT DETECTED.  The SARS-CoV-2 RNA is generally detectable in upper respiratory specimens during the acute phase of infection. The lowest concentration of SARS-CoV-2 viral copies this assay can detect is 138 copies/mL. A negative result does not preclude SARS-Cov-2 infection and should not be used as the sole basis for treatment or other patient management decisions. A negative result may occur with  improper specimen collection/handling, submission of specimen other than nasopharyngeal swab, presence of viral mutation(s) within the areas targeted by this assay, and inadequate number of viral copies(<138 copies/mL). A negative result must be combined with clinical observations, patient history, and epidemiological information. The expected result is Negative.  Fact Sheet for Patients:  EntrepreneurPulse.com.au  Fact Sheet for Healthcare Providers:  IncredibleEmployment.be  This test is no t yet approved or cleared by the [place] and  has been authorized for detection and/or diagnosis of  SARS-CoV-2 by FDA under an Emergency Use Authorization (EUA). This EUA will remain  in effect (meaning this test can be used) for the duration of the COVID-19 declaration under Section 564(b)(1) of the Act, 21 U.S.C.section 360bbb-3(b)(1), unless the authorization is terminated  or revoked sooner.       Influenza A by PCR NEGATIVE NEGATIVE Final   Influenza B by PCR NEGATIVE NEGATIVE Final    Comment: (NOTE) The Xpert Xpress SARS-CoV-2/FLU/RSV plus assay is intended as an aid in the diagnosis of influenza from Nasopharyngeal swab specimens and should not be used as a sole basis for treatment. Nasal washings and aspirates are unacceptable for Xpert Xpress SARS-CoV-2/FLU/RSV testing.  Fact Sheet for Patients: EntrepreneurPulse.com.au  Fact Sheet for Healthcare Providers: IncredibleEmployment.be  This test is not yet approved or cleared by the [place] FDA and has been authorized for detection and/or diagnosis of SARS-CoV-2 by FDA under an Emergency Use Authorization (EUA). This EUA will remain in effect (meaning this test can be used) for the duration of the COVID-19 declaration under Section 564(b)(1) of the Act, 21 U.S.C. section 360bbb-3(b)(1), unless the authorization is terminated or revoked.  Performed at [facility], [place] [address] [place], [place] [address]   Urine culture     Status: None   Collection Time: 05/28/20  1:29 AM   Specimen: In/Out Cath Urine  Result Value Ref Range Status   Specimen Description IN/OUT CATH URINE  Final   Special Requests NONE  Final   Culture   Final    NO GROWTH Performed at [facility], [place] [address] [place], [place] [address]    Report Status 05/30/2020 FINAL  Final  Culture, Respiratory w Gram Stain     Status: None   Collection Time: 05/28/20  9:53 AM   Specimen: Tracheal Aspirate; Respiratory  Result Value Ref Range Status   Specimen Description TRACHEAL ASPIRATE   Final   Special Requests NONE  Final   Gram Stain   Final    RARE WBC PRESENT, PREDOMINANTLY PMN RARE SQUAMOUS EPITHELIAL CELLS PRESENT ABUNDANT GRAM POSITIVE COCCI ABUNDANT GRAM NEGATIVE RODS MODERATE GRAM POSITIVE RODS    Culture   Final    Consistent with normal respiratory flora. No Pseudomonas species isolated Performed at [place] [address] [place], [place] [address]    Report Status 05/30/2020 FINAL  Final  Respiratory (~20 pathogens) panel by PCR     Status: None   Collection Time: 05/28/20 10:15 AM   Specimen: Tracheal Aspirate; Respiratory  Result Value Ref Range Status   Adenovirus NOT DETECTED NOT DETECTED Final   Coronavirus 229E NOT DETECTED NOT DETECTED Final    Comment: (NOTE) The Coronavirus on the Respiratory Panel, DOES NOT test for the novel  Coronavirus (2019 nCoV)    Coronavirus HKU1 NOT DETECTED NOT DETECTED Final   Coronavirus NL63 NOT DETECTED NOT DETECTED Final   Coronavirus OC43 NOT DETECTED NOT DETECTED Final   Metapneumovirus NOT DETECTED NOT DETECTED Final   Rhinovirus / Enterovirus NOT DETECTED NOT DETECTED Final   Influenza A NOT DETECTED NOT DETECTED Final   Influenza B NOT DETECTED NOT DETECTED Final   Parainfluenza Virus 1 NOT DETECTED NOT DETECTED Final   Parainfluenza Virus 2 NOT DETECTED NOT DETECTED Final   Parainfluenza Virus 3 NOT DETECTED NOT DETECTED Final   Parainfluenza Virus 4 NOT DETECTED NOT DETECTED Final   Respiratory Syncytial Virus NOT  DETECTED NOT DETECTED Final   Bordetella pertussis NOT DETECTED NOT DETECTED Final   Bordetella Parapertussis NOT DETECTED NOT DETECTED Final   Chlamydophila pneumoniae NOT DETECTED NOT DETECTED Final   Mycoplasma pneumoniae NOT DETECTED NOT DETECTED Final    Comment: Performed at [facility], [place] [address], [place], [place] [address]  MRSA PCR Screening     Status: None   Collection Time: 05/28/20  4:52 PM   Specimen: Nasal Mucosa; Nasopharyngeal  Result Value Ref  Range Status   MRSA by PCR NEGATIVE NEGATIVE Final    Comment:        The GeneXpert MRSA Assay (FDA approved for NASAL specimens only), is one component of a comprehensive MRSA colonization surveillance program. It is not intended to diagnose MRSA infection nor to guide or monitor treatment for MRSA infections. Performed at [facility], [place] [address], [place], [place] [address]      Time coordinating discharge: 25 minutes  SIGNED: Antonieta Pert, MD  [facility] 05/30/2020, 11:08 AM  If 7PM-7AM, please contact night-coverage [email or web address]

## 2020-05-30 NOTE — Telephone Encounter (Signed)
Called to follow-up with patient and to confirm new appointment time with Dr. Chryl Heck for Friday, April 15th at 10:40 am. Patient confirmed appointment time and date and is aware that he needs to arrive 15 minutes early. Dr. Chryl Heck aware of the change.

## 2020-05-30 NOTE — Care Management (Signed)
1124 05-30-20 Case Manager called the [facility] @ [phone] to make them aware that the patient has been hospitalized. The office will call the Case Manager with authorization. No further needs from Case Manager at this time. Bethena Roys, RN,BSN Case Manager

## 2020-06-01 ENCOUNTER — Inpatient Hospital Stay: Payer: Commercial Managed Care - PPO | Attending: Hematology and Oncology | Admitting: Hematology and Oncology

## 2020-06-01 ENCOUNTER — Encounter (INDEPENDENT_AMBULATORY_CARE_PROVIDER_SITE_OTHER): Payer: PRIVATE HEALTH INSURANCE | Admitting: Otolaryngology

## 2020-06-01 ENCOUNTER — Other Ambulatory Visit: Payer: Self-pay

## 2020-06-01 ENCOUNTER — Encounter: Payer: Self-pay | Admitting: Hematology and Oncology

## 2020-06-01 VITALS — BP 115/64 | HR 100 | Temp 98.8°F | Resp 19 | Ht 75.0 in | Wt 283.1 lb

## 2020-06-01 DIAGNOSIS — Z79899 Other long term (current) drug therapy: Secondary | ICD-10-CM | POA: Insufficient documentation

## 2020-06-01 DIAGNOSIS — R634 Abnormal weight loss: Secondary | ICD-10-CM | POA: Diagnosis not present

## 2020-06-01 DIAGNOSIS — R590 Localized enlarged lymph nodes: Secondary | ICD-10-CM | POA: Diagnosis not present

## 2020-06-01 DIAGNOSIS — Z5112 Encounter for antineoplastic immunotherapy: Secondary | ICD-10-CM | POA: Insufficient documentation

## 2020-06-01 DIAGNOSIS — C01 Malignant neoplasm of base of tongue: Secondary | ICD-10-CM

## 2020-06-01 DIAGNOSIS — Z5111 Encounter for antineoplastic chemotherapy: Secondary | ICD-10-CM | POA: Diagnosis not present

## 2020-06-01 LAB — CULTURE, BLOOD (ROUTINE X 2)
Culture: NO GROWTH
Culture: NO GROWTH

## 2020-06-01 MED ORDER — LIDOCAINE-PRILOCAINE 2.5-2.5 % EX CREA: TOPICAL_CREAM | CUTANEOUS | 3 refills | Status: DC

## 2020-06-01 MED ORDER — ONDANSETRON HCL 8 MG PO TABS: 8.0000 mg | ORAL_TABLET | Freq: Two times a day (BID) | ORAL | 1 refills | Status: DC | PRN

## 2020-06-01 MED ORDER — PROCHLORPERAZINE MALEATE 10 MG PO TABS: 10.0000 mg | ORAL_TABLET | Freq: Four times a day (QID) | ORAL | 1 refills | Status: DC | PRN

## 2020-06-01 MED ORDER — DEXAMETHASONE 4 MG PO TABS: 8.0000 mg | ORAL_TABLET | Freq: Every day | ORAL | 1 refills | Status: DC

## 2020-06-01 MED ORDER — LORAZEPAM 0.5 MG PO TABS: 0.5000 mg | ORAL_TABLET | Freq: Four times a day (QID) | ORAL | 0 refills | Status: DC | PRN

## 2020-06-01 NOTE — Assessment & Plan Note (Signed)
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  His CPS score is 2.  Have discussed that this is considered metastatic disease given squamous cell carcinoma identified in the subcarinal lymph nodes.  His PET/CT otherwise did not show any evidence of distant metastatic disease besides subcarinal lymphadenopathy.  Since he is of robust performance status and very motivated, we have discussed about frontline chemoimmunotherapy for 2 or 3 cycles and reevaluate for possible concurrent chemoradiation course depending on his response and his tolerance.  He understands that this is not the typical approach and distant metastatic disease but he is very motivated to try and do his best since he has never been sick prior to this. Discussed about carboplatin/5-FU with today, discussed mechanism of action, adverse effects including fatigue, nausea, vomiting, diarrhea, increased risk of infections, coronary vasospasm, and severe toxicity in patients with DPD deficiency, toxicity with immunotherapy including but not limited to dry skin, hypo-/hyperthyroidism, colitis, hepatitis, nephritis, myocarditis.  He understands that immunotherapy can lead to fatal side effects in patients.   IR guided chest port order placed Anticipate initiation of chemotherapy week of 25th He will return to clinic in approximately 2 weeks.  He needs to be scheduled for chemotherapy teach.

## 2020-06-01 NOTE — Progress Notes (Signed)
START ON PATHWAY REGIMEN - Head and Neck ? ? ?  A cycle is every 21 days: ?    Carboplatin  ?    Fluorouracil  ?    Pembrolizumab  ? ?**Always confirm dose/schedule in your pharmacy ordering system** ? ?Patient Characteristics: ?Oropharynx, HPV Positive, Metastatic, First Line, No Prior Platinum-Based Chemoradiation within 6 Months, PD-L1 Expression Positive (CPS ? 1) ?Disease Classification: Oropharynx ?HPV Status: Positive (+) ?Therapeutic Status: Metastatic Disease ?Line of Therapy: First Line ?Prior Platinum Status: No Prior Platinum-Based Chemoradiation within 6 Months ?PD-L1 Expression Status: PD-L1 Positive (CPS ? 1) ?Intent of Therapy: ?Non-Curative / Palliative Intent, Discussed with Patient ?

## 2020-06-01 NOTE — Assessment & Plan Note (Signed)
He lost about 30 pounds overall, currently is able to swallow most of his foods.  We will also engage nutrition for assistance.

## 2020-06-01 NOTE — Progress Notes (Signed)
[name] CONSULT NOTE  Patient Care Team: Pcp, No as PCP - General  CHIEF COMPLAINTS/PURPOSE OF CONSULTATION:  SCC BOT  ASSESSMENT & PLAN:  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[name]) This is a very pleasant 60 year old male patient with past medical history significant for obstructive sleep apnea, otherwise in excellent health who was recently diagnosed with squamous cell carcinoma of the base of the tongue, p16 positive, 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  His CPS score is 2.  Have discussed that this is considered metastatic disease given squamous cell carcinoma identified in the subcarinal lymph nodes.  His PET/CT otherwise did not show any evidence of distant metastatic disease besides subcarinal lymphadenopathy.  Since he is of robust performance status and very motivated, we have discussed about frontline chemoimmunotherapy for 2 or 3 cycles and reevaluate for possible concurrent chemoradiation course depending on his response and his tolerance.  He understands that this is not the typical approach and distant metastatic disease but he is very motivated to try and do his best since he has never been sick prior to this. Discussed about carboplatin/5-FU with today, discussed mechanism of action, adverse effects including fatigue, nausea, vomiting, diarrhea, increased risk of infections, coronary vasospasm, and severe toxicity in patients with DPD deficiency, toxicity with immunotherapy including but not limited to dry skin, hypo-/hyperthyroidism, colitis, hepatitis, nephritis, myocarditis.  He understands that immunotherapy can lead to fatal side effects in patients.   IR guided chest port order placed Anticipate initiation of chemotherapy week of 25th He will return to clinic in approximately 2 weeks.  He needs to be scheduled for chemotherapy  teach.  Weight loss, unintentional He lost about 30 pounds overall, currently is able to swallow most of his foods.  We will also engage nutrition for assistance.  Orders Placed This Encounter  Procedures  . IR IMAGING GUIDED PORT INSERTION    Standing Status:   Future    Standing Expiration Date:   06/01/2021    Order Specific Question:   Reason for Exam (SYMPTOM  OR DIAGNOSIS REQUIRED)    Answer:   Chemotherapy admin    Order Specific Question:   Preferred Imaging Location?    Answer:   [name]  . CBC with Differential ([name] Only)    Standing Status:   Standing    Number of Occurrences:   20    Standing Expiration Date:   06/01/2021  . CMP ([name] only)    Standing Status:   Standing    Number of Occurrences:   20    Standing Expiration Date:   06/01/2021  . T4    Standing Status:   Standing    Number of Occurrences:   20    Standing Expiration Date:   06/01/2021  . TSH    Standing Status:   Standing    Number of Occurrences:   20    Standing Expiration Date:   06/01/2021  . Ambulatory Referral to [name]    Referral Priority:   Routine    Referral Type:   Consultation    Referral Reason:   Specialty Services Required    Number of Visits Requested:   1  . PHYSICIAN COMMUNICATION ORDER    For pathway [name]: Treatment with Pembrolizumab + Carboplatin + Fluorouracil x 6 Cycles, then Pembrolizumab alone for up to a total of 24  Months (35 cycles).  [name] PHYSICIAN COMMUNICATION 1    Thyroid function tests at baseline and every 3rd cycle.     HISTORY OF PRESENTING ILLNESS:  [name] 60 y.o. male is here because of BOT cancer  Chronology  [name] is a 60 y.o. male who presented to the ED on 04/30/2020 for evaluation of several month history of left-sided neck swelling.  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  He is here with his daughter and fianc today.  He recently  moved to [name] [place].  CT imaging 04/30/2020 showed a large soft tissue mass centered in the region of thel eft tongue base that also extended posteriorly to the glossotonsillar sulcus and vallecula. That portion of the mass had poorly defined margins but appeared to cross the midline and was estimated to measure at least 5.0 x 4.7 x 3.4 cm. The epiglottis and aryeglottic folds were not well separately delineated and may have been involved by mass. Tumor extension was suspected inferiorly at least to the level of the supraglottic larynx. There was also apparent tumor extension on the right into the strap muscles at the level of the supraglottic larynx. Also of note, there was an infiltrated appearance of the pre-epiglottic fat and a more subtly infiltrated appearance of the paraglottic fat, concerning for tumor infiltration. Furthermore, there was asymmetric prominence of th left piriformis sinus, suspicious for left vocal cord palsy. Results were in favor of moderate/severe effacement of the oropharyngeal airway. Additionally, there was non-specific edema within the floor of the mouth/submandibular space as well as extensive bilateral cervical lymphadenopathy that was compatible with nodal metastatic disease. There was also concern for extracapsular extension. Finally, the mid left internal jugular vein was markedly effaced by lymphadenopathy, there was paranasal sinus disease noted, and the was a mild, age-indeterminate T4 superior endplate vertebral compression deformity  The patient saw Dr. [name] on 05/03/2020, who performed a fiberoptic laryngoscopy through the right nostril. Nasopharyngeal was clear, but the base of the tongue was full on the left side and epiglottis was slightly pushed back posteriorly with questionable ulcer on the left base of the tongue extending down tot he left vallecular area.  Biopsy of the base of the tongue on 05/09/2020 revealed: invasive squamous cell carcinoma.   Strongly  p16 positive; emergent tracheostomy was also performed at that time.  Chest CT scan on 05/11/2020 that showed subcarinal mass/adenopathy and low lung volumes with bibasilar atelectasis. However, there was no acute intrathoracic pathology.  05/22/2020, LN station 7 biopsied and consistent with squamous cell carcinoma.  PET 05/24/2020 Large mass in the oropharynx extending into the supraglottic larynx associated with bilateral cervical adenopathy. Area showing marked hypermetabolic features as described.  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  Questionable areas of variable density in the liver, associated with potential increased metabolic activity. Consider contrasted CT or MRI for further evaluation. Overall FDG uptake is heterogeneous within the liver.  Small pulmonary nodules as outlined above, nonspecific. Suggest attention on follow-up. Not associated with increased metabolic activity.  Ethmoid sinus disease.  We discussed him in ENT TB and recommendation was to consider upfront chemo immunotherapy vs chemotherapy given subcarinal LN demonstrating SCC  He missed his last appointment because of hospital admission and is here for initial visit. He doesn't have any fevers, will complete his abx this Sunday He has to hold his neck down most of  the time, he feels trach is being pulled if he lifts up his neck He complains of cough which is most bothersome, happens in spells He can swallow most of his food, lost about 30 lbs overall. He denies depression, he is very motivated. Rest of the pertinent 10 point ROS reviewed and negative.  MEDICAL HISTORY:  Past Medical History:  Diagnosis Date  . Asthma   . Cancer ([place])   . GERD (gastroesophageal reflux disease)   . Sleep apnea    CPAP- does not wear    SURGICAL HISTORY: Past Surgical History:  Procedure Laterality Date  . BRONCHIAL NEEDLE ASPIRATION BIOPSY  05/22/2020    Procedure: BRONCHIAL NEEDLE ASPIRATION BIOPSIES;  Surgeon: Garner Nash, DO;  Location: [facility];  Service: Pulmonary;;  . DIRECT LARYNGOSCOPY  05/09/2020   Procedure: DIRECT LARYNGOSCOPY;  Surgeon: Rozetta Nunnery, MD;  Location: [facility];  Service: ENT;;  . MASS EXCISION N/A 05/09/2020   Procedure: EXCISION TONGUE MASS;  Surgeon: Rozetta Nunnery, MD;  Location: [facility];  Service: ENT;  Laterality: N/A;  . TONSILLECTOMY    . TRACHEOSTOMY TUBE PLACEMENT  05/09/2020   Procedure: TRACHEOSTOMY;  Surgeon: Rozetta Nunnery, MD;  Location: [facility];  Service: ENT;;  . VIDEO BRONCHOSCOPY WITH ENDOBRONCHIAL ULTRASOUND N/A 05/22/2020   Procedure: VIDEO BRONCHOSCOPY WITH ENDOBRONCHIAL ULTRASOUND;  Surgeon: Garner Nash, DO;  Location: [facility];  Service: Pulmonary;  Laterality: N/A;    SOCIAL HISTORY: Social History   Socioeconomic History  . Marital status: Single    Spouse name: Not on file  . Number of children: Not on file  . Years of education: Not on file  . Highest education level: Not on file  Occupational History  . Not on file  Tobacco Use  . Smoking status: Never Smoker  . Smokeless tobacco: Never Used  Vaping Use  . Vaping Use: Never used  Substance and Sexual Activity  . Alcohol use: Not Currently    Comment: "4-5 drinks a year for special ocassions" 05/08/20  . Drug use: Never  . Sexual activity: Not Currently  Other Topics Concern  . Not on file  Social History Narrative  . Not on file   Social Determinants of Health   Financial Resource Strain: Not on file  Food Insecurity: Not on file  Transportation Needs: Not on file  Physical Activity: Not on file  Stress: Not on file  Social Connections: Not on file  Intimate Partner Violence: Not on file    FAMILY HISTORY: Family History  Problem Relation Age of Onset  . Breast cancer Mother   . Cancer - Cervical Mother   . Colon cancer Father   . Colon cancer Brother     ALLERGIES:  has No  Known Allergies.  MEDICATIONS:  Current Outpatient Medications  Medication Sig Dispense Refill  . albuterol (PROVENTIL) (2.5 MG/3ML) 0.083% nebulizer solution Take 3 mLs (2.5 mg total) by nebulization every 6 (six) hours as needed for wheezing or shortness of breath. 120 mL 12  . albuterol (VENTOLIN HFA) 108 (90 Base) MCG/ACT inhaler Inhale 2 puffs into the lungs as needed for wheezing or shortness of breath.    [name] amoxicillin-clavulanate (AUGMENTIN) 875-125 MG tablet Take 1 tablet by mouth 2 (two) times daily for 5 days. 10 tablet 0  . bacitracin ointment Apply 1 application topically every 8 (eight) hours. Apply around trach wound 120 g 0  . cetirizine (ZYRTEC) 10 MG tablet Take 10 mg by mouth daily.    [name]  fluticasone (FLONASE) 50 MCG/ACT nasal spray Place 2 sprays into both nostrils daily. Place 1 spray each nostril at night 16 g 11  . Fluticasone-Salmeterol (ADVAIR) 100-50 MCG/DOSE AEPB Inhale 1 puff into the lungs daily.    [name] omeprazole (PRILOSEC) 40 MG capsule Take 40 mg by mouth daily.    [name] dexamethasone (DECADRON) 4 MG tablet Take 2 tablets (8 mg total) by mouth daily. Start the day after carboplatin chemotherapy for 3 days. 30 tablet 1  . lidocaine-prilocaine (EMLA) cream Apply to affected area once 30 g 3  . LORazepam (ATIVAN) 0.5 MG tablet Take 1 tablet (0.5 mg total) by mouth every 6 (six) hours as needed (Nausea or vomiting). 30 tablet 0  . ondansetron (ZOFRAN) 8 MG tablet Take 1 tablet (8 mg total) by mouth 2 (two) times daily as needed for refractory nausea / vomiting. Start on day 3 after carboplatin chemo. 30 tablet 1  . prochlorperazine (COMPAZINE) 10 MG tablet Take 1 tablet (10 mg total) by mouth every 6 (six) hours as needed (Nausea or vomiting). 30 tablet 1   No current facility-administered medications for this visit.     PHYSICAL EXAMINATION:  ECOG PERFORMANCE STATUS: 0 - Asymptomatic  Vitals:   06/01/20 1103  BP: 115/64  Pulse: 100  Resp: 19  Temp: 98.8 F (37.1  C)  SpO2: 95%   Filed Weights   06/01/20 1103  Weight: 283 lb 1.6 oz (128.4 kg)    GENERAL:alert, no distress and comfortable SKIN: skin color, texture, turgor are normal, no rashes or significant lesions EYES: normal, conjunctiva are pink and non-injected, sclera clear OROPHARYNX:no exudate, no erythema and lips, buccal mucosa, and tongue normal  NECK: trach in place LYMPH:  Bilateral palpable cervical LN, bulky. LUNGS: clear to auscultation and percussion with normal breathing effort HEART: regular rate & rhythm and no murmurs and no lower extremity edema ABDOMEN:abdomen soft, non-tender and normal bowel sounds Musculoskeletal:no cyanosis of digits and no clubbing  PSYCH: alert & oriented x 3 with fluent speech NEURO: no focal motor/sensory deficits  LABORATORY DATA:  I have reviewed the data as listed Lab Results  Component Value Date   WBC 11.1 (H) 05/30/2020   HGB 12.2 (L) 05/30/2020   HCT 35.9 (L) 05/30/2020   MCV 89.1 05/30/2020   PLT 185 05/30/2020     Chemistry      Component Value Date/Time   NA 133 (L) 05/29/2020 0321   K 3.5 05/29/2020 0321   CL 101 05/29/2020 0321   CO2 23 05/29/2020 0321   BUN 8 05/29/2020 0321   CREATININE 1.12 05/29/2020 0321      Component Value Date/Time   CALCIUM 8.4 (L) 05/29/2020 0321   ALKPHOS 55 05/28/2020 0602   AST 31 05/28/2020 0602   ALT 54 (H) 05/28/2020 0602   BILITOT 1.3 (H) 05/28/2020 0602       RADIOGRAPHIC STUDIES: I have personally reviewed the radiological images as listed and agreed with the findings in the report. DG Orthopantogram  Result Date: 05/28/2020 CLINICAL DATA:  Tongue cancer EXAM: ORTHOPANTOGRAM/PANORAMIC COMPARISON:  None. FINDINGS: Panorex view of the mandible was performed. There are no acute or destructive bony lesions. Visualized soft tissues are unremarkable. IMPRESSION: 1. No acute or destructive bony lesions. Electronically Signed   By: Randa Ngo M.D.   On: 05/28/2020 17:18   DG  Chest 2 View  Result Date: 05/29/2020 CLINICAL DATA:  60 year old male with fever. EXAM: CHEST - 2 VIEW COMPARISON:  Chest radiographs 05/27/2020  and earlier. FINDINGS: PA and lateral views today. Tracheostomy appears stable and satisfactory. Normal cardiac size and mediastinal contours. Larger lung volumes. Mild new streaky opacity at both lung bases most resembles atelectasis. No pneumothorax, pulmonary edema, pleural effusion or consolidation. No acute osseous abnormality identified. Negative visible bowel gas pattern. IMPRESSION: Larger lung volumes with mild bibasilar streaky opacity that more resembles atelectasis than infection. No pleural effusion. Electronically Signed   By: Genevie Ann M.D.   On: 05/29/2020 07:18   DG Chest 2 View  Result Date: 05/28/2020 CLINICAL DATA:  Fever. EXAM: CHEST - 2 VIEW COMPARISON:  May 10, 2020 FINDINGS: Stable tracheostomy tube positioning is noted. The heart size and mediastinal contours are within normal limits. Both lungs are clear. The questionable tiny pulmonary nodule seen overlying the upper left lung on the prior study is not visualized on the current exam. The visualized skeletal structures are unremarkable. IMPRESSION: No active cardiopulmonary disease. Electronically Signed   By: Virgina Norfolk M.D.   On: 05/28/2020 00:04   CT Soft Tissue Neck W Contrast  Result Date: 05/27/2020 CLINICAL DATA:  Fever and sore throat. 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 EXAM: CT NECK WITH CONTRAST TECHNIQUE: Multidetector CT imaging of the neck was performed using the standard protocol following the bolus administration of intravenous contrast. CONTRAST:  14mL OMNIPAQUE IOHEXOL 300 MG/ML  SOLN COMPARISON:  CT neck 04/30/2020 FINDINGS: Pharynx and larynx: Mass of the left tongue base is unchanged compared 2 05/24/2020. The pharyngeal airway remains deviated to the right. Mild thickening of the epiglottis is unchanged. Salivary glands: Parotid and  submandibular glands are normal. Thyroid: Normal. Lymph nodes: Unchanged size of left level 2 lymph node measuring 2.9 cm (3:71). Unchanged 1.8 cm right level 2A node (3:57). Unchanged right level 3 node measuring 1.5 cm (3:85). Unchanged 10 mm right level 3 node (3:73). Vascular: Unremarkable Limited intracranial: Normal Visualized orbits: Normal Mastoids and visualized paranasal sinuses: Clear. Skeleton: No acute or aggressive process. Upper chest: Tracheostomy tube tip is at the level of the clavicular heads. Lung apices are clear. Other: None IMPRESSION: 1. Unchanged appearance of mass of the left tongue base and bilateral cervical lymphadenopathy. 2. Tracheostomy tube tip is at the level of the clavicular heads. Electronically Signed   By: Ulyses Jarred M.D.   On: 05/27/2020 23:40   CT CHEST W CONTRAST  Result Date: 05/11/2020 CLINICAL DATA:  60 year old male with left tongue base mass. EXAM: CT CHEST WITH CONTRAST TECHNIQUE: Multidetector CT imaging of the chest was performed during intravenous contrast administration. CONTRAST:  46mL OMNIPAQUE IOHEXOL 300 MG/ML  SOLN COMPARISON:  Chest radiograph dated 05/10/2020 FINDINGS: Cardiovascular: There is no cardiomegaly or pericardial effusion. The thoracic aorta is unremarkable. The origins of the great vessels of the aortic arch appear patent as visualized. The central pulmonary arteries appear patent for the degree of opacification. Mediastinum/Nodes: Subcarinal mass/adenopathy measures 2.3 cm in short axis. No hilar adenopathy. The esophagus is grossly unremarkable. No mediastinal fluid collection. Small amount of fluid and air in the anterior mediastinum likely secondary to tracheostomy. Lungs/Pleura: There is shallow inspiration with bibasilar atelectasis. No pleural effusion or pneumothorax. The central airways are patent. A tracheostomy is noted with tip approximately 4.5 cm above the carina. Upper Abdomen: Probable fatty liver. Musculoskeletal:  Degenerative changes of the spine. No acute osseous pathology. IMPRESSION: 1. No acute intrathoracic pathology. 2. Subcarinal mass/adenopathy. 3. Low lung volumes with bibasilar atelectasis. Electronically Signed   By: Laren Everts.D.  On: 05/11/2020 02:06   NM PET Image Initial (PI) Skull Base To Thigh  Result Date: 05/25/2020 CLINICAL DATA:  Initial treatment strategy for head neck cancer, squamous cell carcinoma of the tongue base now with mediastinal adenopathy on recent CT evaluation. EXAM: NUCLEAR MEDICINE PET SKULL BASE TO THIGH TECHNIQUE: Fifteen mCi F-18 FDG was injected intravenously. Full-ring PET imaging was performed from the skull base to thigh after the radiotracer. CT data was obtained and used for attenuation correction and anatomic localization. Fasting blood glucose: 100 mg/dl COMPARISON:  Chest CT from May 11, 2020, CT neck of April 30, 2020 FINDINGS: Mediastinal blood pool activity: SUV max 3.42 Liver activity: SUV max NA NECK: Irregular appearing mass at the base of tongue and floor of the mouth extending inferiorly extending towards the epiglottis supraglottic larynx as outlined on recent CT of the neck displays a maximum SUV of 17.5. Large LEFT level II and III nodal disease measuring approximately 3 cm short axis grossly unchanged accounting for lack of contrast since previous imaging with a maximum SUV of 8.2 (image 30, series 3) Lymph nodes throughout both the LEFT and RIGHT neck display similar FDG uptake but are smaller, unchanged compared to recent CT of the neck. Posterior triangle lymph node on image 37 of series 3 with only a maximum SUV of 2.7 displays rounded morphology however and measures approximately 11 mm. Airways narrowed. Tracheostomy tube is in place. Incidental CT findings: Airway narrowing with tracheostomy tube as described. CHEST: Marked hypermetabolic subcarinal lymph node with a maximum SUV of 9.9 on image 85 of series 3 measures 2.2 cm short axis,  essentially stable compared to recent imaging. No additional hypermetabolic lymph nodes in the chest. Small nodule along the RIGHT mediastinal border not definitely seen on previous imaging measuring approximately 8 mm (image 110, series 3) no increased FDG uptake. Branching nodularity in the peripheral RIGHT chest on image 104 of series 3 measures approximately 8-9 mm. Not definitely seen on previous imaging. Lungs with improved aeration since the prior study. Airways are patent. Incidental CT findings: Tracheostomy tube in-situ. Resolution of subcutaneous emphysema seen on the prior study. Normal caliber thoracic aorta. No significant atherosclerotic changes. Normal heart size without pericardial effusion. Normal caliber central pulmonary vasculature. Limited assessment of cardiovascular structures given lack of intravenous contrast. ABDOMEN/PELVIS: Question of subtle low-density lesion in the LEFT hemi liver on image 135 of series 3. This may measure as much as 1.8 cm. Heterogeneous PET uptake in this area as the stomach crosses beneath the liver margin. There is an area of increased metabolic activity seen cephalad on image 133 with a maximum SUV of 5.8. Subtle variable attenuation on image 118 of series 3 in the posterior RIGHT hemiliver along the capsular margin measuring approximately 1.3 cm with a maximum SUV of 5.9 Overall again there is marked heterogeneity of hepatic activity. The CT portion of the exam with very limited assessment due to arm position. No additional sites of potential disease in the abdomen or in the pelvis Incidental CT findings: Liver with potential background steatosis. Gallbladder unremarkable grossly. No acute findings related to spleen, pancreas, adrenal glands and kidneys. No hydronephrosis. Collapse of the urinary bladder. No acute gastrointestinal findings. No adenopathy in the abdomen or pelvis by size criteria. Fat containing umbilical hernia. SKELETON: No focal hypermetabolic  activity to suggest skeletal metastasis. Incidental CT findings: none IMPRESSION: Large mass in the oropharynx extending into the supraglottic larynx associated with bilateral cervical adenopathy. Area showing marked hypermetabolic features as  described. 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 Questionable areas of variable density in the liver, associated with potential increased metabolic activity. Consider contrasted CT or MRI for further evaluation. Overall FDG uptake is heterogeneous within the liver. Small pulmonary nodules as outlined above, nonspecific. Suggest attention on follow-up. Not associated with increased metabolic activity. Ethmoid sinus disease. Electronically Signed   By: Zetta Bills M.D.   On: 05/25/2020 13:33   DG CHEST PORT 1 VIEW  Result Date: 05/10/2020 CLINICAL DATA:  Tracheostomy. EXAM: PORTABLE CHEST 1 VIEW COMPARISON:  No prior. FINDINGS: Tracheostomy noted in stable position. Heart size normal. Low lung volumes with mild bibasilar atelectasis. Questionable tiny nodule left upper lung. PA lateral chest x-ray suggested for further evaluation. Tiny bilateral pleural effusions cannot be excluded. No pneumothorax. IMPRESSION: 1. Tracheostomy tube in stable position. 2. Low lung volumes with mild bibasilar atelectasis. Tiny bilateral pleural effusions cannot be excluded. 3. Questionable tiny pulmonary nodule left upper lung. PA lateral chest x-ray suggested for further evaluation. Electronically Signed   By: Marcello Moores  Register   On: 05/10/2020 08:16   Reviewed all pertinent imaging and pathology results.  All questions were answered. The patient knows to call the clinic with any problems, questions or concerns. I spent 60 minutes in the care of this patient including H and P, review of records, counseling and coordination of care.     Benay Pike, MD 06/01/2020 12:45 PM

## 2020-06-04 ENCOUNTER — Encounter: Payer: Self-pay | Admitting: Hematology and Oncology

## 2020-06-04 ENCOUNTER — Telehealth: Payer: Self-pay | Admitting: *Deleted

## 2020-06-04 ENCOUNTER — Telehealth: Payer: Self-pay | Admitting: Nutrition

## 2020-06-04 NOTE — Telephone Encounter (Signed)
Message routed to Dr Chryl Heck  Dr. Chryl Heck,  Patient called today asking for the prescriptions that you ordered to [facility] to be canceled and reordered through [facility].  Can you please do that?  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  They require all medications to go through a different insurance plan and get authorization before they will pay.  Currently at [facility] he has an out of pocket cost >$200.  His chemo will also need to be authorized through this special plan ([employer]).  Also, he cannot have appointments scheduled until they are approved with [employer].   First step is to get his at home medications reordered and then I'm going to route his updated information to the people who authorize his chemo.  I will include you in those messages as FYI.  Thanks, [employer]

## 2020-06-04 NOTE — Telephone Encounter (Signed)
Scheduled appt per 4/15 sch msg. Pt aware.  

## 2020-06-05 ENCOUNTER — Other Ambulatory Visit (HOSPITAL_COMMUNITY): Payer: Self-pay

## 2020-06-05 ENCOUNTER — Other Ambulatory Visit: Payer: Self-pay | Admitting: *Deleted

## 2020-06-05 ENCOUNTER — Encounter (HOSPITAL_COMMUNITY): Payer: Self-pay | Admitting: General Practice

## 2020-06-05 ENCOUNTER — Telehealth: Payer: Self-pay | Admitting: *Deleted

## 2020-06-05 DIAGNOSIS — C01 Malignant neoplasm of base of tongue: Secondary | ICD-10-CM

## 2020-06-05 MED ORDER — LIDOCAINE-PRILOCAINE 2.5-2.5 % EX CREA
TOPICAL_CREAM | CUTANEOUS | 3 refills | Status: DC
  Filled 2020-06-05: qty 30, 30d supply, fill #0

## 2020-06-05 MED ORDER — PROCHLORPERAZINE MALEATE 10 MG PO TABS
10.0000 mg | ORAL_TABLET | Freq: Four times a day (QID) | ORAL | 1 refills | Status: DC | PRN
  Filled 2020-06-05: qty 30, 8d supply, fill #0

## 2020-06-05 MED ORDER — DEXAMETHASONE 4 MG PO TABS
8.0000 mg | ORAL_TABLET | Freq: Every day | ORAL | 1 refills | Status: DC
  Filled 2020-06-05: qty 30, 15d supply, fill #0

## 2020-06-05 MED ORDER — ONDANSETRON HCL 8 MG PO TABS
8.0000 mg | ORAL_TABLET | Freq: Two times a day (BID) | ORAL | 1 refills | Status: DC | PRN
  Filled 2020-06-05: qty 30, 15d supply, fill #0

## 2020-06-05 MED ORDER — LORAZEPAM 0.5 MG PO TABS
0.5000 mg | ORAL_TABLET | Freq: Four times a day (QID) | ORAL | 0 refills | Status: DC | PRN
  Filled 2020-06-05: qty 30, 8d supply, fill #0

## 2020-06-05 MED ORDER — LORAZEPAM 0.5 MG PO TABS: 0.5000 mg | ORAL_TABLET | Freq: Four times a day (QID) | ORAL | 0 refills | Status: DC | PRN

## 2020-06-05 NOTE — Progress Notes (Signed)
[facility] Psychosocial Distress Screening Clinical Social Work  Clinical Social Work was referred by distress screening protocol.  The patient scored a 8 on the Psychosocial Distress Thermometer which indicates moderate distress. Clinical Social Worker contacted patient by phone to assess for distress and other psychosocial needs. He is aware of the upcoming appointments.  He has coverage through [employer] - these must be preauthorized in advance.  "If treatments are not authorized, he will need to pay out of pocket for treatment."  Children are here in [place], has fiancee is [place].  He does have help if needed.  Has lived in this area for approx one year.  He is aware that he may need a feeding tube if he is unable to eat by mouth.  Is in good spirits, optimistic.    ONCBCN DISTRESS SCREENING 06/01/2020  Screening Type Initial Screening  Distress experienced in past week (1-10) 8  Emotional problem type Depression;Nervousness/Anxiety;Adjusting to illness    Clinical Social Worker follow up needed: No.  If yes, follow up plan:  Beverely Pace, [place], LCSW Clinical Social Worker Phone:  [phone]

## 2020-06-05 NOTE — Telephone Encounter (Signed)
Called for PA for port placement and auth for meds (Decadron, EMLA, Ativan, Zofran and Compazine).   [facility] [phone] [place] ([place])

## 2020-06-06 ENCOUNTER — Inpatient Hospital Stay: Payer: Commercial Managed Care - PPO

## 2020-06-06 ENCOUNTER — Other Ambulatory Visit (HOSPITAL_COMMUNITY): Payer: Self-pay

## 2020-06-06 ENCOUNTER — Other Ambulatory Visit: Payer: Self-pay

## 2020-06-06 ENCOUNTER — Telehealth (HOSPITAL_COMMUNITY): Payer: Self-pay

## 2020-06-06 NOTE — Telephone Encounter (Signed)
4.20 - [name] from Cancer center called to follow up on MBS scheduling. Explained to her that patient stated to call him next week once he knows his cehmo schedule. She is okay with that. Will call patient on Monday. HW

## 2020-06-07 ENCOUNTER — Encounter: Payer: Self-pay | Admitting: Hematology and Oncology

## 2020-06-07 ENCOUNTER — Telehealth: Payer: Self-pay | Admitting: *Deleted

## 2020-06-07 ENCOUNTER — Inpatient Hospital Stay: Payer: Commercial Managed Care - PPO | Admitting: Nutrition

## 2020-06-07 ENCOUNTER — Other Ambulatory Visit: Payer: Self-pay

## 2020-06-07 NOTE — Progress Notes (Signed)
60 year old male diagnosed with tongue cancer status post emergent trach.  He is followed by Dr. Chryl Heck and Dr. Isidore Moos.  Plan is for Carboplatin, Fluorouracil, and pembrolizumab.  Then patient will be assessed for radiation and further chemotherapy.  Past medical history includes sleep apnea, GERD, asthma.  Medications include Decadron, Ativan, Prilosec, Zofran, and Compazine.  Labs include sodium 133 and glucose 108 on April 12.  Height: 6 foot 3 inches. Weight: 283.1 pounds April 15. Usual body weight: 320 pounds in October 2021. BMI: 35.39.  Patient diagnosed with nonsevere protein calorie malnutrition during hospital admission.  12% weight loss over 6 months.  He reports his appetite is improved since he was discharged.  States food was terrible in the hospital and that is why he could not eat.  He can only swallow on his right side of his mouth.  He hates oral nutrition supplements but has begun making smoothies with yogurt fruit and spinach every morning.  He drinks water or ice tea with meals.  Reports he can eat sliced tomatoes, avocado, mac & cheese, spam with cheese and pretzels with hummus.  He denies current nutrition impact symptoms.  He wants to know if he can drink alkaline water.  Nutrition diagnosis: Food and nutrition related knowledge deficit related to tongue cancer and associated treatments as evidenced by no prior need for nutrition related information.  Intervention: Educated patient on the importance of small frequent meals and snacks with high-calorie, high-protein foods.  Encouraged texture modifications as needed. Reviewed options for high-calorie smoothies that do not include oral nutrition supplements.  Provided multiple recipes. Recommended nausea medicine as needed.. Encouraged bowel regimen. Answered questions about alkaline water. Provided multiple nutrition fact sheets.  Questions were answered.  Teach back method used.  Contact information  given.  Monitoring, evaluation, goals: Patient will tolerate adequate calories and protein to minimize weight loss.  Next visit: Tuesday, April 26 during infusion.  **Disclaimer: This note was dictated with voice recognition software. Similar sounding words can inadvertently be transcribed and this note may contain transcription errors which may not have been corrected upon publication of note.**

## 2020-06-07 NOTE — Telephone Encounter (Signed)
Received call from [facility] with [place] [phone].    Medications approved: Marton Redwood, [place] [id number] [place] [phone]  Auth # [id number] for Cert of O15 from 61/53/79 - 08/04/20  Provided this information to Drucie Opitz.  Also, advised that we still need to get authorization for IV Decadron/Normal Saline/Saline Flush/Heparin Flush/ and all emergency medications for treatment day.  She will proceed with getting those submitted for approval.  All of his oral medications were approved.  His portacath placement as of today is still pending approval.

## 2020-06-07 NOTE — Telephone Encounter (Signed)
Received approval for portacath placement.  Auth # [id number]   Date 06/05/2020-08/04/2020

## 2020-06-07 NOTE — Progress Notes (Signed)
Met with patient/accompanying adult at registration to introduce myself as Financial Resource Specialist and to offer available resources.  Discussed one-time $1000 [employer] grant and qualifications to assist with personal expenses while going through treatment.  Gave him my card if interested in applying and for any additional financial questions or concerns. 

## 2020-06-11 ENCOUNTER — Other Ambulatory Visit: Payer: Self-pay | Admitting: Hematology and Oncology

## 2020-06-11 ENCOUNTER — Other Ambulatory Visit (HOSPITAL_COMMUNITY): Payer: Self-pay

## 2020-06-11 ENCOUNTER — Other Ambulatory Visit: Payer: Self-pay | Admitting: Radiology

## 2020-06-11 DIAGNOSIS — C01 Malignant neoplasm of base of tongue: Secondary | ICD-10-CM

## 2020-06-11 MED ORDER — PROCHLORPERAZINE MALEATE 10 MG PO TABS
10.0000 mg | ORAL_TABLET | Freq: Four times a day (QID) | ORAL | 1 refills | Status: DC | PRN
  Filled 2020-06-11: qty 30, 8d supply, fill #0

## 2020-06-11 MED ORDER — LORAZEPAM 0.5 MG PO TABS: 0.5000 mg | ORAL_TABLET | Freq: Four times a day (QID) | ORAL | 0 refills | Status: DC | PRN

## 2020-06-11 MED ORDER — DEXAMETHASONE 4 MG PO TABS
8.0000 mg | ORAL_TABLET | Freq: Every day | ORAL | 1 refills | Status: DC
  Filled 2020-06-11: qty 30, 15d supply, fill #0

## 2020-06-11 MED ORDER — LIDOCAINE-PRILOCAINE 2.5-2.5 % EX CREA
TOPICAL_CREAM | CUTANEOUS | 3 refills | Status: DC
  Filled 2020-06-11: qty 30, 30d supply, fill #0

## 2020-06-11 MED ORDER — ONDANSETRON HCL 8 MG PO TABS
8.0000 mg | ORAL_TABLET | Freq: Two times a day (BID) | ORAL | 1 refills | Status: DC | PRN
  Filled 2020-06-11: qty 30, 15d supply, fill #0

## 2020-06-12 ENCOUNTER — Other Ambulatory Visit: Payer: Self-pay

## 2020-06-12 ENCOUNTER — Inpatient Hospital Stay: Payer: Commercial Managed Care - PPO

## 2020-06-12 ENCOUNTER — Other Ambulatory Visit: Payer: Self-pay | Admitting: Hematology and Oncology

## 2020-06-12 ENCOUNTER — Inpatient Hospital Stay: Payer: Commercial Managed Care - PPO | Admitting: Nutrition

## 2020-06-12 ENCOUNTER — Ambulatory Visit (HOSPITAL_COMMUNITY)
Admission: RE | Admit: 2020-06-12 | Discharge: 2020-06-12 | Disposition: A | Discharge status: Home / Self Care | Payer: Commercial Managed Care - PPO | Source: Ambulatory Visit | Attending: Hematology and Oncology | Admitting: Hematology and Oncology

## 2020-06-12 ENCOUNTER — Encounter (HOSPITAL_COMMUNITY): Payer: Self-pay

## 2020-06-12 VITALS — BP 124/75 | HR 73 | Temp 98.8°F | Resp 17 | Wt 272.5 lb

## 2020-06-12 DIAGNOSIS — Z79899 Other long term (current) drug therapy: Secondary | ICD-10-CM | POA: Diagnosis not present

## 2020-06-12 DIAGNOSIS — C01 Malignant neoplasm of base of tongue: Secondary | ICD-10-CM | POA: Diagnosis present

## 2020-06-12 HISTORY — PX: IR IMAGING GUIDED PORT INSERTION: IMG5740

## 2020-06-12 LAB — CMP (CANCER CENTER ONLY)
ALT: 62 U/L — ABNORMAL HIGH (ref 0–44)
AST: 33 U/L (ref 15–41)
Albumin: 3.2 g/dL — ABNORMAL LOW (ref 3.5–5.0)
Alkaline Phosphatase: 60 U/L (ref 38–126)
Anion gap: 9 (ref 5–15)
BUN: 7 mg/dL (ref 6–20)
CO2: 28 mmol/L (ref 22–32)
Calcium: 9.2 mg/dL (ref 8.9–10.3)
Chloride: 104 mmol/L (ref 98–111)
Creatinine: 0.86 mg/dL (ref 0.61–1.24)
GFR, Estimated: >60 mL/min (ref >60)
Glucose, Bld: 99 mg/dL (ref 70–99)
Potassium: 3.6 mmol/L (ref 3.5–5.1)
Sodium: 141 mmol/L (ref 135–145)
Total Bilirubin: 0.6 mg/dL (ref 0.3–1.2)
Total Protein: 7 g/dL (ref 6.5–8.1)

## 2020-06-12 MED ORDER — MIDAZOLAM HCL 2 MG/2ML IJ SOLN
INTRAMUSCULAR | Status: AC
  Filled 2020-06-12: qty 2

## 2020-06-12 MED ORDER — SODIUM CHLORIDE 0.9 % IV SOLN
10.0000 mg | Freq: Once | INTRAVENOUS | Status: AC
  Administered 2020-06-12: 10 mg via INTRAVENOUS
  Filled 2020-06-12: qty 10

## 2020-06-12 MED ORDER — SODIUM CHLORIDE 0.9 % IV SOLN: INTRAVENOUS | Status: DC

## 2020-06-12 MED ORDER — LIDOCAINE HCL 1 % IJ SOLN
INTRAMUSCULAR | Status: AC
  Filled 2020-06-12: qty 20

## 2020-06-12 MED ORDER — HEPARIN SOD (PORK) LOCK FLUSH 100 UNIT/ML IV SOLN
INTRAVENOUS | Status: AC
  Filled 2020-06-12: qty 5

## 2020-06-12 MED ORDER — FENTANYL CITRATE (PF) 100 MCG/2ML IJ SOLN
INTRAMUSCULAR | Status: AC
  Filled 2020-06-12: qty 2

## 2020-06-12 MED ORDER — SODIUM CHLORIDE 0.9 % IV SOLN
Freq: Once | INTRAVENOUS | Status: AC
Start: 2020-06-12 — End: 2020-06-12
  Filled 2020-06-12: qty 250

## 2020-06-12 MED ORDER — SODIUM CHLORIDE 0.9 % IV SOLN
200.0000 mg | Freq: Once | INTRAVENOUS | Status: AC
  Administered 2020-06-12: 200 mg via INTRAVENOUS
  Filled 2020-06-12: qty 8

## 2020-06-12 MED ORDER — SODIUM CHLORIDE 0.9 % IV SOLN
150.0000 mg | Freq: Once | INTRAVENOUS | Status: AC
  Administered 2020-06-12: 150 mg via INTRAVENOUS
  Filled 2020-06-12: qty 150

## 2020-06-12 MED ORDER — HEPARIN SOD (PORK) LOCK FLUSH 100 UNIT/ML IV SOLN
INTRAVENOUS | Status: AC | PRN
  Administered 2020-06-12: 500 [IU] via INTRAVENOUS

## 2020-06-12 MED ORDER — MIDAZOLAM HCL 2 MG/2ML IJ SOLN
INTRAMUSCULAR | Status: AC | PRN
  Administered 2020-06-12 (×2): 1 mg via INTRAVENOUS

## 2020-06-12 MED ORDER — SODIUM CHLORIDE 0.9 % IV SOLN
750.0000 mg | Freq: Once | INTRAVENOUS | Status: AC
  Administered 2020-06-12: 750 mg via INTRAVENOUS
  Filled 2020-06-12: qty 75

## 2020-06-12 MED ORDER — FENTANYL CITRATE (PF) 100 MCG/2ML IJ SOLN
INTRAMUSCULAR | Status: AC | PRN
  Administered 2020-06-12 (×2): 50 ug via INTRAVENOUS

## 2020-06-12 MED ORDER — LIDOCAINE HCL 1 % IJ SOLN
INTRAMUSCULAR | Status: AC | PRN
  Administered 2020-06-12: 10 mL

## 2020-06-12 MED ORDER — PALONOSETRON HCL INJECTION 0.25 MG/5ML
0.2500 mg | Freq: Once | INTRAVENOUS | Status: AC
Start: 2020-06-12 — End: 2020-06-12
  Administered 2020-06-12: 0.25 mg via INTRAVENOUS

## 2020-06-12 MED ORDER — SODIUM CHLORIDE 0.9 % IV SOLN
960.0000 mg/m2/d | INTRAVENOUS | Status: DC
  Administered 2020-06-12: 10000 mg via INTRAVENOUS
  Filled 2020-06-12: qty 200

## 2020-06-12 MED ORDER — PALONOSETRON HCL INJECTION 0.25 MG/5ML
INTRAVENOUS | Status: AC
  Filled 2020-06-12: qty 5

## 2020-06-12 NOTE — Procedures (Signed)
Interventional Radiology Procedure Note  Procedure: Chest port  Indication: Tongue base SCC  Findings: Please refer to procedural dictation for full description.  Complications: None  EBL: < 10 mL  Miachel Roux, MD [phone]

## 2020-06-12 NOTE — Patient Instructions (Addendum)
[facility]COL[facility]  Discharge Instructions: Thank you for choosing [facility] to provide your oncology and hematology care.   If you have a lab appointment with the Cancer Center, please go directly to the Cancer Center and check in at the registration area.   Wear comfortable clothing and clothing appropriate for easy access to any Portacath or PICC line.   We strive to give you quality time with your provider. You may need to reschedule your appointment if you arrive late (15 or more minutes).  Arriving late affects you and other patients whose appointments are after yours.  Also, if you miss three or more appointments without notifying the office, you may be dismissed from the clinic at the provider's discretion.      For prescription refill requests, have your pharmacy contact our office and allow 72 hours for refills to be completed.    Today you received the following chemotherapy and/or immunotherapy agents: Keytruda, Carboplatin, 5FU     To help prevent nausea and vomiting after your treatment, we encourage you to take your nausea medication as directed.  BELOW ARE SYMPTOMS THAT SHOULD BE REPORTED IMMEDIATELY: *FEVER GREATER THAN 100.4 F (38 C) OR HIGHER *CHILLS OR SWEATING *NAUSEA AND VOMITING THAT IS NOT CONTROLLED WITH YOUR NAUSEA MEDICATION *UNUSUAL SHORTNESS OF BREATH *UNUSUAL BRUISING OR BLEEDING *URINARY PROBLEMS (pain or burning when urinating, or frequent urination) *BOWEL PROBLEMS (unusual diarrhea, constipation, pain near the anus) TENDERNESS IN MOUTH AND THROAT WITH OR WITHOUT PRESENCE OF ULCERS (sore throat, sores in mouth, or a toothache) UNUSUAL RASH, SWELLING OR PAIN  UNUSUAL VAGINAL DISCHARGE OR ITCHING   Items with * indicate a potential emergency and should be followed up as soon as possible or go to the Emergency Department if any problems should occur.  Please show the CHEMOTHERAPY ALERT CARD or IMMUNOTHERAPY ALERT CARD  at check-in to the Emergency Department and triage nurse.  Should you have questions after your visit or need to cancel or reschedule your appointment, please contact [facility]  Dept: [phone]  and follow the prompts.  Office hours are 8:00 a.m. to 4:30 p.m. Monday - Friday. Please note that voicemails left after 4:00 p.m. may not be returned until the following business day.  We are closed weekends and major holidays. You have access to a nurse at all times for urgent questions. Please call the main number to the clinic Dept: [phone] and follow the prompts.   For any non-urgent questions, you may also contact your provider using MyChart. We now offer e-Visits for anyone 18 and older to request care online for non-urgent symptoms. For details visit [email or web address].   Also download the MyChart app! Go to the app store, search "MyChart", open the app, select [facility], and log in with your MyChart username and password.  Due to Covid, a mask is required upon entering the hospital/clinic. If you do not have a mask, one will be given to you upon arrival. For doctor visits, patients may have 1 support person aged 18 or older with them. For treatment visits, patients cannot have anyone with them due to current Covid guidelines and our immunocompromised population.   Pembrolizumab injection What is this medicine? PEMBROLIZUMAB (pem broe liz ue mab) is a monoclonal antibody. It is used to treat certain types of cancer. This medicine may be used for other purposes; ask your health care provider or pharmacist if you have questions. COMMON BRAND NAME(S): Keytruda   or pharmacist if you have questions. COMMON BRAND NAME(S): Keytruda What should I tell my health care provider before I take this medicine? They need to know if you have any of these conditions:  autoimmune diseases like Crohn's disease, ulcerative colitis, or lupus  have had or planning to have an allogeneic stem cell transplant (uses  someone else's stem cells)  history of organ transplant  history of chest radiation  nervous system problems like myasthenia gravis or Guillain-Barre syndrome  an unusual or allergic reaction to pembrolizumab, other medicines, foods, dyes, or preservatives  pregnant or trying to get pregnant  breast-feeding How should I use this medicine? This medicine is for infusion into a vein. It is given by a health care professional in a hospital or clinic setting. A special MedGuide will be given to you before each treatment. Be sure to read this information carefully each time. Talk to your pediatrician regarding the use of this medicine in children. While this drug may be prescribed for children as young as 6 months for selected conditions, precautions do apply. Overdosage: If you think you have taken too much of this medicine contact a poison control center or emergency room at once. NOTE: This medicine is only for you. Do not share this medicine with others. What if I miss a dose? It is important not to miss your dose. Call your doctor or health care professional if you are unable to keep an appointment. What may interact with this medicine? Interactions have not been studied. This list may not describe all possible interactions. Give your health care provider a list of all the medicines, herbs, non-prescription drugs, or dietary supplements you use. Also tell them if you smoke, drink alcohol, or use illegal drugs. Some items may interact with your medicine. What should I watch for while using this medicine? Your condition will be monitored carefully while you are receiving this medicine. You may need blood work done while you are taking this medicine. Do not become pregnant while taking this medicine or for 4 months after stopping it. Women should inform their doctor if they wish to become pregnant or think they might be pregnant. There is a potential for serious side effects to an unborn  child. Talk to your health care professional or pharmacist for more information. Do not breast-feed an infant while taking this medicine or for 4 months after the last dose. What side effects may I notice from receiving this medicine? Side effects that you should report to your doctor or health care professional as soon as possible:  allergic reactions like skin rash, itching or hives, swelling of the face, lips, or tongue  bloody or black, tarry  breathing problems  changes in vision  chest pain  chills  confusion  constipation  cough  diarrhea  dizziness or feeling faint or lightheaded  fast or irregular heartbeat  fever  flushing  joint pain  low blood counts - this medicine may decrease the number of white blood cells, red blood cells and platelets. You may be at increased risk for infections and bleeding.  muscle pain  muscle weakness  pain, tingling, numbness in the hands or feet  persistent headache  redness, blistering, peeling or loosening of the skin, including inside the mouth  signs and symptoms of high blood sugar such as dizziness; dry mouth; dry skin; fruity breath; nausea; stomach pain; increased hunger or thirst; increased urination  signs and symptoms of kidney injury like trouble passing urine or change in  require medical attention (report to your doctor or health care professional if they continue or are bothersome): decreased appetite hair loss tiredness This list may not describe all possible side effects. Call your doctor for medical advice about side effects. You may report side effects to FDA at 1-800-FDA-1088. Where should I keep my medicine? This drug is given in a hospital or clinic and will not be stored at home. NOTE: This sheet is a summary. It may not cover  all possible information. If you have questions about this medicine, talk to your doctor, pharmacist, or health care provider.  2021 Elsevier/Gold Standard (2019-01-05 21:44:53)  Carboplatin injection What is this medicine? CARBOPLATIN (KAR boe pla tin) is a chemotherapy drug. It targets fast dividing cells, like cancer cells, and causes these cells to die. This medicine is used to treat ovarian cancer and many other cancers. This medicine may be used for other purposes; ask your health care provider or pharmacist if you have questions. COMMON BRAND NAME(S): Paraplatin What should I tell my health care provider before I take this medicine? They need to know if you have any of these conditions: blood disorders hearing problems kidney disease recent or ongoing radiation therapy an unusual or allergic reaction to carboplatin, cisplatin, other chemotherapy, other medicines, foods, dyes, or preservatives pregnant or trying to get pregnant breast-feeding How should I use this medicine? This drug is usually given as an infusion into a vein. It is administered in a hospital or clinic by a specially trained health care professional. Talk to your pediatrician regarding the use of this medicine in children. Special care may be needed. Overdosage: If you think you have taken too much of this medicine contact a poison control center or emergency room at once. NOTE: This medicine is only for you. Do not share this medicine with others. What if I miss a dose? It is important not to miss a dose. Call your doctor or health care professional if you are unable to keep an appointment. What may interact with this medicine? medicines for seizures medicines to increase blood counts like filgrastim, pegfilgrastim, sargramostim some antibiotics like amikacin, gentamicin, neomycin, streptomycin, tobramycin vaccines Talk to your doctor or health care professional before taking any of these  medicines: acetaminophen aspirin ibuprofen ketoprofen naproxen This list may not describe all possible interactions. Give your health care provider a list of all the medicines, herbs, non-prescription drugs, or dietary supplements you use. Also tell them if you smoke, drink alcohol, or use illegal drugs. Some items may interact with your medicine. What should I watch for while using this medicine? Your condition will be monitored carefully while you are receiving this medicine. You will need important blood work done while you are taking this medicine. This drug may make you feel generally unwell. This is not uncommon, as chemotherapy can affect healthy cells as well as cancer cells. Report any side effects. Continue your course of treatment even though you feel ill unless your doctor tells you to stop. In some cases, you may be given additional medicines to help with side effects. Follow all directions for their use. Call your doctor or health care professional for advice if you get a fever, chills or sore throat, or other symptoms of a cold or flu. Do not treat yourself. This drug decreases your body's ability to fight infections. Try to avoid being around people who are sick. This medicine may increase your risk to bruise or bleed. Call your doctor or health care professional   if you notice any unusual bleeding. Be careful brushing and flossing your teeth or using a toothpick because you may get an infection or bleed more easily. If you have any dental work done, tell your dentist you are receiving this medicine. Avoid taking products that contain aspirin, acetaminophen, ibuprofen, naproxen, or ketoprofen unless instructed by your doctor. These medicines may hide a fever. Do not become pregnant while taking this medicine. Women should inform their doctor if they wish to become pregnant or think they might be pregnant. There is a potential for serious side effects to an unborn child. Talk to your  health care professional or pharmacist for more information. Do not breast-feed an infant while taking this medicine. What side effects may I notice from receiving this medicine? Side effects that you should report to your doctor or health care professional as soon as possible: allergic reactions like skin rash, itching or hives, swelling of the face, lips, or tongue signs of infection - fever or chills, cough, sore throat, pain or difficulty passing urine signs of decreased platelets or bleeding - bruising, pinpoint red spots on the skin, black, tarry stools, nosebleeds signs of decreased red blood cells - unusually weak or tired, fainting spells, lightheadedness breathing problems changes in hearing changes in vision chest pain high blood pressure low blood counts - This drug may decrease the number of white blood cells, red blood cells and platelets. You may be at increased risk for infections and bleeding. nausea and vomiting pain, swelling, redness or irritation at the injection site pain, tingling, numbness in the hands or feet problems with balance, talking, walking trouble passing urine or change in the amount of urine Side effects that usually do not require medical attention (report to your doctor or health care professional if they continue or are bothersome): hair loss loss of appetite metallic taste in the mouth or changes in taste This list may not describe all possible side effects. Call your doctor for medical advice about side effects. You may report side effects to FDA at 1-800-FDA-1088. Where should I keep my medicine? This drug is given in a hospital or clinic and will not be stored at home. NOTE: This sheet is a summary. It may not cover all possible information. If you have questions about this medicine, talk to your doctor, pharmacist, or health care provider.  2021 Elsevier/Gold Standard (2007-05-11 14:38:05)  Fluorouracil, 5-FU injection What is this  medicine? FLUOROURACIL, 5-FU (flure oh YOOR a sil) is a chemotherapy drug. It slows the growth of cancer cells. This medicine is used to treat many types of cancer like breast cancer, colon or rectal cancer, pancreatic cancer, and stomach cancer. This medicine may be used for other purposes; ask your health care provider or pharmacist if you have questions. COMMON BRAND NAME(S): Adrucil What should I tell my health care provider before I take this medicine? They need to know if you have any of these conditions: blood disorders dihydropyrimidine dehydrogenase (DPD) deficiency infection (especially a virus infection such as chickenpox, cold sores, or herpes) kidney disease liver disease malnourished, poor nutrition recent or ongoing radiation therapy an unusual or allergic reaction to fluorouracil, other chemotherapy, other medicines, foods, dyes, or preservatives pregnant or trying to get pregnant breast-feeding How should I use this medicine? This drug is given as an infusion or injection into a vein. It is administered in a hospital or clinic by a specially trained health care professional. Talk to your pediatrician regarding the use of this medicine   in children. Special care may be needed. Overdosage: If you think you have taken too much of this medicine contact a poison control center or emergency room at once. NOTE: This medicine is only for you. Do not share this medicine with others. What if I miss a dose? It is important not to miss your dose. Call your doctor or health care professional if you are unable to keep an appointment. What may interact with this medicine? Do not take this medicine with any of the following medications: live virus vaccines This medicine may also interact with the following medications: medicines that treat or prevent blood clots like warfarin, enoxaparin, and dalteparin This list may not describe all possible interactions. Give your health care provider a  list of all the medicines, herbs, non-prescription drugs, or dietary supplements you use. Also tell them if you smoke, drink alcohol, or use illegal drugs. Some items may interact with your medicine. What should I watch for while using this medicine? Visit your doctor for checks on your progress. This drug may make you feel generally unwell. This is not uncommon, as chemotherapy can affect healthy cells as well as cancer cells. Report any side effects. Continue your course of treatment even though you feel ill unless your doctor tells you to stop. In some cases, you may be given additional medicines to help with side effects. Follow all directions for their use. Call your doctor or health care professional for advice if you get a fever, chills or sore throat, or other symptoms of a cold or flu. Do not treat yourself. This drug decreases your body's ability to fight infections. Try to avoid being around people who are sick. This medicine may increase your risk to bruise or bleed. Call your doctor or health care professional if you notice any unusual bleeding. Be careful brushing and flossing your teeth or using a toothpick because you may get an infection or bleed more easily. If you have any dental work done, tell your dentist you are receiving this medicine. Avoid taking products that contain aspirin, acetaminophen, ibuprofen, naproxen, or ketoprofen unless instructed by your doctor. These medicines may hide a fever. Do not become pregnant while taking this medicine. Women should inform their doctor if they wish to become pregnant or think they might be pregnant. There is a potential for serious side effects to an unborn child. Talk to your health care professional or pharmacist for more information. Do not breast-feed an infant while taking this medicine. Men should inform their doctor if they wish to father a child. This medicine may lower sperm counts. Do not treat diarrhea with over the counter  products. Contact your doctor if you have diarrhea that lasts more than 2 days or if it is severe and watery. This medicine can make you more sensitive to the sun. Keep out of the sun. If you cannot avoid being in the sun, wear protective clothing and use sunscreen. Do not use sun lamps or tanning beds/booths. What side effects may I notice from receiving this medicine? Side effects that you should report to your doctor or health care professional as soon as possible: allergic reactions like skin rash, itching or hives, swelling of the face, lips, or tongue low blood counts - this medicine may decrease the number of white blood cells, red blood cells and platelets. You may be at increased risk for infections and bleeding. signs of infection - fever or chills, cough, sore throat, pain or difficulty passing urine signs of   decreased platelets or bleeding - bruising, pinpoint red spots on the skin, black, tarry stools, blood in the urine signs of decreased red blood cells - unusually weak or tired, fainting spells, lightheadedness breathing problems changes in vision chest pain mouth sores nausea and vomiting pain, swelling, redness at site where injected pain, tingling, numbness in the hands or feet redness, swelling, or sores on hands or feet stomach pain unusual bleeding Side effects that usually do not require medical attention (report to your doctor or health care professional if they continue or are bothersome): changes in finger or toe nails diarrhea dry or itchy skin hair loss headache loss of appetite sensitivity of eyes to the light stomach upset unusually teary eyes This list may not describe all possible side effects. Call your doctor for medical advice about side effects. You may report side effects to FDA at 1-800-FDA-1088. Where should I keep my medicine? This drug is given in a hospital or clinic and will not be stored at home. NOTE: This sheet is a summary. It may not  cover all possible information. If you have questions about this medicine, talk to your doctor, pharmacist, or health care provider.  2021 Elsevier/Gold Standard (2019-01-04 15:00:03)   

## 2020-06-12 NOTE — Discharge Instructions (Signed)
Implanted Port Home Guide An implanted port is a device that is placed under the skin. It is usually placed in the chest. The device can be used to give IV medicine, to take blood, or for dialysis. You may have an implanted port if:  You need IV medicine that would be irritating to the small veins in your hands or arms.  You need IV medicines, such as antibiotics, for a long period of time.  You need IV nutrition for a long period of time.  You need dialysis. When you have a port, your health care provider can choose to use the port instead of veins in your arms for these procedures. You may have fewer limitations when using a port than you would if you used other types of long-term IVs, and you will likely be able to return to normal activities after your incision heals. An implanted port has two main parts:  Reservoir. The reservoir is the part where a needle is inserted to give medicines or draw blood. The reservoir is round. After it is placed, it appears as a small, raised area under your skin.  Catheter. The catheter is a thin, flexible tube that connects the reservoir to a vein. Medicine that is inserted into the reservoir goes into the catheter and then into the vein. How is my port accessed? To access your port:  A numbing cream may be placed on the skin over the port site.  Your health care provider will put on a mask and sterile gloves.  The skin over your port will be cleaned carefully with a germ-killing soap and allowed to dry.  Your health care provider will gently pinch the port and insert a needle into it.  Your health care provider will check for a blood return to make sure the port is in the vein and is not clogged.  If your port needs to remain accessed to get medicine continuously (constant infusion), your health care provider will place a clear bandage (dressing) over the needle site. The dressing and needle will need to be changed every week, or as told by your  health care provider. What is flushing? Flushing helps keep the port from getting clogged. Follow instructions from your health care provider about how and when to flush the port. Ports are usually flushed with saline solution or a medicine called heparin. The need for flushing will depend on how the port is used:  If the port is only used from time to time to give medicines or draw blood, the port may need to be flushed: ? Before and after medicines have been given. ? Before and after blood has been drawn. ? As part of routine maintenance. Flushing may be recommended every 4-6 weeks.  If a constant infusion is running, the port may not need to be flushed.  Throw away any syringes in a disposal container that is meant for sharp items (sharps container). You can buy a sharps container from a pharmacy, or you can make one by using an empty hard plastic bottle with a cover. How long will my port stay implanted? The port can stay in for as long as your health care provider thinks it is needed. When it is time for the port to come out, a surgery will be done to remove it. The surgery will be similar to the procedure that was done to put the port in. Follow these instructions at home:  Flush your port as told by your health care   provider.  If you need an infusion over several days, follow instructions from your health care provider about how to take care of your port site. Make sure you: ? Wash your hands with soap and water before you change your dressing. If soap and water are not available, use alcohol-based hand sanitizer. ? Change your dressing as told by your health care provider. ? Place any used dressings or infusion bags into a plastic bag. Throw that bag in the trash. ? Keep the dressing that covers the needle clean and dry. Do not get it wet. ? Do not use scissors or sharp objects near the tube. ? Keep the tube clamped, unless it is being used.  Check your port site every day for signs  of infection. Check for: ? Redness, swelling, or pain. ? Fluid or blood. ? Pus or a bad smell.  Protect the skin around the port site. ? Avoid wearing bra straps that rub or irritate the site. ? Protect the skin around your port from seat belts. Place a soft pad over your chest if needed.  Bathe or shower as told by your health care provider. The site may get wet as long as you are not actively receiving an infusion.  Return to your normal activities as told by your health care provider. Ask your health care provider what activities are safe for you.  Carry a medical alert card or wear a medical alert bracelet at all times. This will let health care providers know that you have an implanted port in case of an emergency.   Get help right away if:  You have redness, swelling, or pain at the port site.  You have fluid or blood coming from your port site.  You have pus or a bad smell coming from the port site.  You have a fever. Summary  Implanted ports are usually placed in the chest for long-term IV access.  Follow instructions from your health care provider about flushing the port and changing bandages (dressings).  Take care of the area around your port by avoiding clothing that puts pressure on the area, and by watching for signs of infection.  Protect the skin around your port from seat belts. Place a soft pad over your chest if needed.  Get help right away if you have a fever or you have redness, swelling, pain, drainage, or a bad smell at the port site. This information is not intended to replace advice given to you by your health care provider. Make sure you discuss any questions you have with your health care provider. Document Revised: 06/20/2019 Document Reviewed: 06/20/2019 Elsevier Patient Education  2021 Elsevier Inc. Moderate Conscious Sedation, Adult, Care After This sheet gives you information about how to care for yourself after your procedure. Your health care  provider may also give you more specific instructions. If you have problems or questions, contact your health care provider. What can I expect after the procedure? After the procedure, it is common to have:  Sleepiness for several hours.  Impaired judgment for several hours.  Difficulty with balance.  Vomiting if you eat too soon. Follow these instructions at home: For the time period you were told by your health care provider:  Rest.  Do not participate in activities where you could fall or become injured.  Do not drive or use machinery.  Do not drink alcohol.  Do not take sleeping pills or medicines that cause drowsiness.  Do not make important decisions or sign   legal documents.  Do not take care of children on your own.      Eating and drinking  Follow the diet recommended by your health care provider.  Drink enough fluid to keep your urine pale yellow.  If you vomit: ? Drink water, juice, or soup when you can drink without vomiting. ? Make sure you have little or no nausea before eating solid foods.   General instructions  Take over-the-counter and prescription medicines only as told by your health care provider.  Have a responsible adult stay with you for the time you are told. It is important to have someone help care for you until you are awake and alert.  Do not smoke.  Keep all follow-up visits as told by your health care provider. This is important. Contact a health care provider if:  You are still sleepy or having trouble with balance after 24 hours.  You feel light-headed.  You keep feeling nauseous or you keep vomiting.  You develop a rash.  You have a fever.  You have redness or swelling around the IV site. Get help right away if:  You have trouble breathing.  You have new-onset confusion at home. Summary  After the procedure, it is common to feel sleepy, have impaired judgment, or feel nauseous if you eat too soon.  Rest after you  get home. Know the things you should not do after the procedure.  Follow the diet recommended by your health care provider and drink enough fluid to keep your urine pale yellow.  Get help right away if you have trouble breathing or new-onset confusion at home. This information is not intended to replace advice given to you by your health care provider. Make sure you discuss any questions you have with your health care provider. Document Revised: 06/03/2019 Document Reviewed: 12/30/2018 Elsevier Patient Education  2021 Elsevier Inc.  

## 2020-06-12 NOTE — H&P (Signed)
Chief Complaint: Patient was seen in consultation today for Port a cayh placement at the request of [place]  Referring Physician(s): Benay Pike  Supervising Physician: Mir, Biochemist, clinical  Patient Status: [facility] - Out-pt  History of Present Illness: Alexander [name] is a 60 y.o. 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 Asthma   . Cancer ([place])   . GERD (gastroesophageal reflux disease)   . Sleep apnea    CPAP- does not wear    Past Surgical History:  Procedure Laterality Date  . BRONCHIAL NEEDLE ASPIRATION BIOPSY  05/22/2020   Procedure: BRONCHIAL NEEDLE ASPIRATION BIOPSIES;  Surgeon: Garner Nash, DO;  Location: [facility];  Service: Pulmonary;;  . DIRECT LARYNGOSCOPY  05/09/2020   Procedure: DIRECT LARYNGOSCOPY;  Surgeon: Rozetta Nunnery, MD;  Location: [facility];  Service: ENT;;  . MASS EXCISION N/A 05/09/2020   Procedure: EXCISION TONGUE MASS;  Surgeon: Rozetta Nunnery, MD;  Location: [facility];  Service: ENT;  Laterality: N/A;  . TONSILLECTOMY    . TRACHEOSTOMY TUBE PLACEMENT  05/09/2020   Procedure: TRACHEOSTOMY;  Surgeon: Rozetta Nunnery, MD;  Location: [facility];  Service: ENT;;  . VIDEO BRONCHOSCOPY WITH ENDOBRONCHIAL ULTRASOUND N/A 05/22/2020   Procedure: VIDEO BRONCHOSCOPY WITH ENDOBRONCHIAL ULTRASOUND;  Surgeon: Garner Nash, DO;  Location: [place];  Service: Pulmonary;  Laterality: N/A;    Allergies: Patient has no known allergies.  Medications: Prior to Admission medications   Medication Sig Start Date End Date Taking? Authorizing Provider  bacitracin ointment Apply 1 application topically every 8 (eight) hours. Apply around trach  wound 05/12/20  Yes Aline August, MD  cetirizine (ZYRTEC) 10 MG tablet Take 10 mg by mouth daily.   Yes [provider]  fluticasone (FLONASE) 50 MCG/ACT nasal spray Place 2 sprays into both nostrils daily. Place 1 spray each nostril at night 05/25/20  Yes Rozetta Nunnery, MD  omeprazole (PRILOSEC) 40 MG capsule Take 40 mg by mouth daily.   Yes [provider]  albuterol (PROVENTIL) (2.5 MG/3ML) 0.083% nebulizer solution Take 3 mLs (2.5 mg total) by nebulization every 6 (six) hours as needed for wheezing or shortness of breath. 05/17/20   Icard, Octavio Graves, DO  albuterol (VENTOLIN HFA) 108 (90 Base) MCG/ACT inhaler Inhale 2 puffs into the lungs as needed for wheezing or shortness of breath.    [provider]  dexamethasone (DECADRON) 4 MG tablet Take 2 tablets (8 mg total) by mouth daily. Start the day after carboplatin chemotherapy for 3 days. 06/11/20   Benay Pike, MD  Fluticasone-Salmeterol (ADVAIR) 100-50 MCG/DOSE AEPB Inhale 1 puff into the lungs daily.    [provider]  lidocaine-prilocaine (EMLA) cream Apply to affected area once as directed 06/11/20   Benay Pike, MD  LORazepam (ATIVAN) 0.5 MG tablet Take 1 tablet (0.5 mg total) by mouth every 6 (six) hours as needed (Nausea or vomiting). 06/11/20   Benay Pike, MD  ondansetron (ZOFRAN) 8 MG tablet Take 1 tablet (8 mg total) by mouth 2 (two) times daily as needed for refractory nausea / vomiting. Start on day 3 after carboplatin chemo. 06/11/20   Benay Pike,  MD  prochlorperazine (COMPAZINE) 10 MG tablet Take 1 tablet (10 mg total) by mouth every 6 (six) hours as needed (Nausea or vomiting). 06/11/20   Benay Pike, MD     Family History  Problem Relation Age of Onset  . Breast cancer Mother   . Cancer - Cervical Mother   . Colon cancer Father   . Colon cancer Brother     Social History   Socioeconomic History  . Marital status: Single    Spouse name: Not on file  . Number of  children: Not on file  . Years of education: Not on file  . Highest education level: Not on file  Occupational History  . Not on file  Tobacco Use  . Smoking status: Never Smoker  . Smokeless tobacco: Never Used  Vaping Use  . Vaping Use: Never used  Substance and Sexual Activity  . Alcohol use: Not Currently    Comment: "4-5 drinks a year for special ocassions" 05/08/20  . Drug use: Never  . Sexual activity: Not Currently  Other Topics Concern  . Not on file  Social History Narrative  . Not on file   Social Determinants of Health   Financial Resource Strain: Low Risk   . Difficulty of Paying Living Expenses: Not hard at all  Food Insecurity: No Food Insecurity  . Worried About Charity fundraiser in the Last Year: Never true  . Ran Out of Food in the Last Year: Never true  Transportation Needs: No Transportation Needs  . Lack of Transportation (Medical): No  . Lack of Transportation (Non-Medical): No  Physical Activity: Not on file  Stress: No Stress Concern Present  . Feeling of Stress : Not at all  Social Connections: Socially Isolated  . Frequency of Communication with Friends and Family: More than three times a week  . Frequency of Social Gatherings with Friends and Family: More than three times a week  . Attends Religious Services: Never  . Active Member of Clubs or Organizations: No  . Attends Archivist Meetings: Never  . Marital Status: Separated    Review of Systems: A 12 point ROS discussed and pertinent positives are indicated in the HPI above.  All other systems are negative.  Review of Systems  Constitutional: Positive for appetite change and unexpected weight change. Negative for activity change and fatigue.  Respiratory: Negative for cough and shortness of breath.   Cardiovascular: Negative for chest pain.  Gastrointestinal: Negative for abdominal pain.  Neurological: Negative for weakness.  Psychiatric/Behavioral: Negative for behavioral  problems and confusion.    Vital Signs: BP 120/78   Pulse 92   Temp 98 F (36.7 C)   Ht 6\' 3"  (1.905 m)   Wt 285 lb (129.3 kg)   SpO2 97%   BMI 35.62 kg/m   Physical Exam Vitals reviewed.  HENT:     Mouth/Throat:     Mouth: Mucous membranes are moist.     Comments: Trach in place Cardiovascular:     Rate and Rhythm: Normal rate and regular rhythm.     Heart sounds: Normal heart sounds.  Pulmonary:     Effort: Pulmonary effort is normal.     Breath sounds: Normal breath sounds.  Abdominal:     Palpations: Abdomen is soft.     Tenderness: There is no abdominal tenderness.  Musculoskeletal:        General: Normal range of motion.  Skin:    General: Skin is warm.  Neurological:  Mental Status: He is alert and oriented to person, place, and time.  Psychiatric:        Behavior: Behavior normal.     Imaging: DG Orthopantogram  Result Date: 05/28/2020 CLINICAL DATA:  Tongue cancer EXAM: ORTHOPANTOGRAM/PANORAMIC COMPARISON:  None. FINDINGS: Panorex view of the mandible was performed. There are no acute or destructive bony lesions. Visualized soft tissues are unremarkable. IMPRESSION: 1. No acute or destructive bony lesions. Electronically Signed   By: Randa Ngo M.D.   On: 05/28/2020 17:18   DG Chest 2 View  Result Date: 05/29/2020 CLINICAL DATA:  60 year old male with fever. EXAM: CHEST - 2 VIEW COMPARISON:  Chest radiographs 05/27/2020 and earlier. FINDINGS: PA and lateral views today. Tracheostomy appears stable and satisfactory. Normal cardiac size and mediastinal contours. Larger lung volumes. Mild new streaky opacity at both lung bases most resembles atelectasis. No pneumothorax, pulmonary edema, pleural effusion or consolidation. No acute osseous abnormality identified. Negative visible bowel gas pattern. IMPRESSION: Larger lung volumes with mild bibasilar streaky opacity that more resembles atelectasis than infection. No pleural effusion. Electronically Signed    By: Genevie Ann M.D.   On: 05/29/2020 07:18   DG Chest 2 View  Result Date: 05/28/2020 CLINICAL DATA:  Fever. EXAM: CHEST - 2 VIEW COMPARISON:  May 10, 2020 FINDINGS: Stable tracheostomy tube positioning is noted. The heart size and mediastinal contours are within normal limits. Both lungs are clear. The questionable tiny pulmonary nodule seen overlying the upper left lung on the prior study is not visualized on the current exam. The visualized skeletal structures are unremarkable. IMPRESSION: No active cardiopulmonary disease. Electronically Signed   By: Virgina Norfolk M.D.   On: 05/28/2020 00:04   CT Soft Tissue Neck W Contrast  Result Date: 05/27/2020 CLINICAL DATA:  Fever and sore throat. 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 EXAM: CT NECK WITH CONTRAST TECHNIQUE: Multidetector CT imaging of the neck was performed using the standard protocol following the bolus administration of intravenous contrast. CONTRAST:  24mL OMNIPAQUE IOHEXOL 300 MG/ML  SOLN COMPARISON:  CT neck 04/30/2020 FINDINGS: Pharynx and larynx: Mass of the left tongue base is unchanged compared 2 05/24/2020. The pharyngeal airway remains deviated to the right. Mild thickening of the epiglottis is unchanged. Salivary glands: Parotid and submandibular glands are normal. Thyroid: Normal. Lymph nodes: Unchanged size of left level 2 lymph node measuring 2.9 cm (3:71). Unchanged 1.8 cm right level 2A node (3:57). Unchanged right level 3 node measuring 1.5 cm (3:85). Unchanged 10 mm right level 3 node (3:73). Vascular: Unremarkable Limited intracranial: Normal Visualized orbits: Normal Mastoids and visualized paranasal sinuses: Clear. Skeleton: No acute or aggressive process. Upper chest: Tracheostomy tube tip is at the level of the clavicular heads. Lung apices are clear. Other: None IMPRESSION: 1. Unchanged appearance of mass of the left tongue base and bilateral cervical lymphadenopathy. 2. Tracheostomy tube tip is at the  level of the clavicular heads. Electronically Signed   By: Ulyses Jarred M.D.   On: 05/27/2020 23:40   NM PET Image Initial (PI) Skull Base To Thigh  Result Date: 05/25/2020 CLINICAL DATA:  Initial treatment strategy for head neck cancer, squamous cell carcinoma of the tongue base now with mediastinal adenopathy on recent CT evaluation. EXAM: NUCLEAR MEDICINE PET SKULL BASE TO THIGH TECHNIQUE: Fifteen mCi F-18 FDG was injected intravenously. Full-ring PET imaging was performed from the skull base to thigh after the radiotracer. CT data was obtained and used for attenuation correction and anatomic localization.  Fasting blood glucose: 100 mg/dl COMPARISON:  Chest CT from May 11, 2020, CT neck of April 30, 2020 FINDINGS: Mediastinal blood pool activity: SUV max 3.42 Liver activity: SUV max NA NECK: Irregular appearing mass at the base of tongue and floor of the mouth extending inferiorly extending towards the epiglottis supraglottic larynx as outlined on recent CT of the neck displays a maximum SUV of 17.5. Large LEFT level II and III nodal disease measuring approximately 3 cm short axis grossly unchanged accounting for lack of contrast since previous imaging with a maximum SUV of 8.2 (image 30, series 3) Lymph nodes throughout both the LEFT and RIGHT neck display similar FDG uptake but are smaller, unchanged compared to recent CT of the neck. Posterior triangle lymph node on image 37 of series 3 with only a maximum SUV of 2.7 displays rounded morphology however and measures approximately 11 mm. Airways narrowed. Tracheostomy tube is in place. Incidental CT findings: Airway narrowing with tracheostomy tube as described. CHEST: Marked hypermetabolic subcarinal lymph node with a maximum SUV of 9.9 on image 85 of series 3 measures 2.2 cm short axis, essentially stable compared to recent imaging. No additional hypermetabolic lymph nodes in the chest. Small nodule along the RIGHT mediastinal border not definitely seen  on previous imaging measuring approximately 8 mm (image 110, series 3) no increased FDG uptake. Branching nodularity in the peripheral RIGHT chest on image 104 of series 3 measures approximately 8-9 mm. Not definitely seen on previous imaging. Lungs with improved aeration since the prior study. Airways are patent. Incidental CT findings: Tracheostomy tube in-situ. Resolution of subcutaneous emphysema seen on the prior study. Normal caliber thoracic aorta. No significant atherosclerotic changes. Normal heart size without pericardial effusion. Normal caliber central pulmonary vasculature. Limited assessment of cardiovascular structures given lack of intravenous contrast. ABDOMEN/PELVIS: Question of subtle low-density lesion in the LEFT hemi liver on image 135 of series 3. This may measure as much as 1.8 cm. Heterogeneous PET uptake in this area as the stomach crosses beneath the liver margin. There is an area of increased metabolic activity seen cephalad on image 133 with a maximum SUV of 5.8. Subtle variable attenuation on image 118 of series 3 in the posterior RIGHT hemiliver along the capsular margin measuring approximately 1.3 cm with a maximum SUV of 5.9 Overall again there is marked heterogeneity of hepatic activity. The CT portion of the exam with very limited assessment due to arm position. No additional sites of potential disease in the abdomen or in the pelvis Incidental CT findings: Liver with potential background steatosis. Gallbladder unremarkable grossly. No acute findings related to spleen, pancreas, adrenal glands and kidneys. No hydronephrosis. Collapse of the urinary bladder. No acute gastrointestinal findings. No adenopathy in the abdomen or pelvis by size criteria. Fat containing umbilical hernia. SKELETON: No focal hypermetabolic activity to suggest skeletal metastasis. Incidental CT findings: none IMPRESSION: Large mass in the oropharynx extending into the supraglottic larynx associated with  bilateral cervical adenopathy. Area showing marked hypermetabolic features as described. 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 Questionable areas of variable density in the liver, associated with potential increased metabolic activity. Consider contrasted CT or MRI for further evaluation. Overall FDG uptake is heterogeneous within the liver. Small pulmonary nodules as outlined above, nonspecific. Suggest attention on follow-up. Not associated with increased metabolic activity. Ethmoid sinus disease. Electronically Signed   By: Zetta Bills M.D.   On: 05/25/2020 13:33    Labs:  CBC: Recent Labs  05/27/20 2201 05/28/20 0602 05/29/20 0321 05/30/20 0328  WBC 18.6* 14.0* 11.6* 11.1*  HGB 13.1 12.7* 11.6* 12.2*  HCT 40.0 39.3 34.9* 35.9*  PLT 207 214 172 185    COAGS: Recent Labs    05/27/20 2235  INR 1.3*  APTT 35    BMP: Recent Labs    05/12/20 0142 05/27/20 2201 05/28/20 0602 05/29/20 0321  NA 136 134* 135 133*  K 3.6 3.7 3.8 3.5  CL 100 101 104 101  CO2 29 23 25 23   GLUCOSE 118* 128* 101* 108*  BUN 14 7 6 8   CALCIUM 9.4 8.8* 8.5* 8.4*  CREATININE 1.08 1.35* 1.27* 1.12  GFRNONAA >60 >60 >60 >60    LIVER FUNCTION TESTS: Recent Labs    04/30/20 0953 05/27/20 2201 05/28/20 0602  BILITOT 1.1 1.8* 1.3*  AST 23 33 31  ALT 24 57* 54*  ALKPHOS 52 52 55  PROT 7.4 6.5 6.3*  ALBUMIN 3.8 3.1* 3.0*    TUMOR MARKERS: No results for input(s): AFPTM, CEA, CA199, CHROMGRNA in the last 8760 hours.  Assessment and Plan:  Dx head/neck cancer Base of tongue Trach in place Scheduled for Port a cath today--- to start chemo therapy today Risks and benefits of image guided port-a-catheter placement was discussed with the patient including, but not limited to bleeding, infection, pneumothorax, or fibrin sheath development and need for additional procedures.  All of the patient's questions were answered, patient is agreeable to  proceed. Consent signed and in chart.   Thank you for this interesting consult.  I greatly enjoyed meeting Doctor Sheahan and look forward to participating in their care.  A copy of this report was sent to the requesting provider on this date.  Electronically Signed: Lavonia Drafts, PA-C 06/12/2020, 8:44 AM   I spent a total of  30 Minutes   in face to face in clinical consultation, greater than 50% of which was counseling/coordinating care for [facility] placement

## 2020-06-12 NOTE — Progress Notes (Signed)
Nutrition follow-up completed with patient during infusion for tongue cancer. Weight decreased to 272 pounds April 26 from 283.1 pounds April 15.  This is a 4% decrease in 1 week. Patient is very drowsy however was able to tell me that he feels he is eating the same.  No change in appetite.  He was very drowsy and could not stay awake for questions.  I spoke briefly with his fiance who did not indicate patient was having any difficulties.  Today is patient's first chemotherapy and RN is preparing to do education.  Labs were reviewed.  Nutrition diagnosis: Food and nutrition related knowledge deficit continues.  Intervention: Encourage patient and fianc to continue strategies for increased calorie and protein intake. Try high-calorie smoothies. Discouraged weight loss.  Monitoring, evaluation, goals: Increase calories and protein to minimize further weight loss.  Next visit: To be scheduled with upcoming treatment.  Patient has my phone number for questions if needed before next appointment.  **Disclaimer: This note was dictated with voice recognition software. Similar sounding words can inadvertently be transcribed and this note may contain transcription errors which may not have been corrected upon publication of note.**

## 2020-06-12 NOTE — Progress Notes (Signed)
Per Dr. Chryl Heck okay to use CBC from 4/13 for treatment today. Also okay to start [facility] for today's treatment with CMP pending.   Port tip is placed in the cavo atrial junction per Dr. Dwaine Gale.  Patient aware of pump stop appt on Sat at 1:30. Dr. Chryl Heck also aware that pump stop will be 94 hours instead of 96 hours and okay with that since no rate change.

## 2020-06-13 ENCOUNTER — Telehealth: Payer: Self-pay | Admitting: *Deleted

## 2020-06-13 NOTE — Telephone Encounter (Signed)
-----   Message from Scot Dock, RN sent at 06/12/2020  4:23 PM EDT ----- Regarding: Dr. Chryl Heck 1st time chemo follow up

## 2020-06-13 NOTE — Telephone Encounter (Signed)
Called patient for chemo follow up. States he is doing well. Denies N/V. No questions or concerns.

## 2020-06-15 ENCOUNTER — Other Ambulatory Visit (HOSPITAL_COMMUNITY)
Admission: RE | Admit: 2020-06-15 | Discharge: 2020-06-15 | Disposition: A | Discharge status: Home / Self Care | Payer: BC Managed Care – PPO | Source: Ambulatory Visit | Attending: Acute Care | Admitting: Acute Care

## 2020-06-15 DIAGNOSIS — Z20822 Contact with and (suspected) exposure to covid-19: Secondary | ICD-10-CM | POA: Insufficient documentation

## 2020-06-15 DIAGNOSIS — Z01812 Encounter for preprocedural laboratory examination: Secondary | ICD-10-CM | POA: Insufficient documentation

## 2020-06-16 ENCOUNTER — Inpatient Hospital Stay: Payer: Commercial Managed Care - PPO

## 2020-06-16 ENCOUNTER — Other Ambulatory Visit: Payer: Self-pay

## 2020-06-16 VITALS — BP 120/82 | HR 76 | Temp 97.9°F | Resp 17

## 2020-06-16 DIAGNOSIS — C01 Malignant neoplasm of base of tongue: Secondary | ICD-10-CM | POA: Diagnosis not present

## 2020-06-16 LAB — SARS CORONAVIRUS 2 (TAT 6-24 HRS): SARS Coronavirus 2: NEGATIVE

## 2020-06-16 MED ORDER — SODIUM CHLORIDE 0.9% FLUSH
10.0000 mL | INTRAVENOUS | Status: DC | PRN
  Administered 2020-06-16: 10 mL
  Filled 2020-06-16: qty 10

## 2020-06-16 MED ORDER — HEPARIN SOD (PORK) LOCK FLUSH 100 UNIT/ML IV SOLN
250.0000 [IU] | Freq: Once | INTRAVENOUS | Status: AC | PRN
  Administered 2020-06-16: 250 [IU]
  Filled 2020-06-16: qty 5

## 2020-06-18 ENCOUNTER — Ambulatory Visit (HOSPITAL_COMMUNITY)
Admission: RE | Admit: 2020-06-18 | Discharge: 2020-06-18 | Disposition: A | Discharge status: Home / Self Care | Payer: Commercial Managed Care - PPO | Source: Ambulatory Visit | Attending: Acute Care | Admitting: Acute Care

## 2020-06-18 ENCOUNTER — Other Ambulatory Visit: Payer: Self-pay

## 2020-06-18 ENCOUNTER — Telehealth (HOSPITAL_COMMUNITY): Payer: Self-pay

## 2020-06-18 DIAGNOSIS — Z430 Encounter for attention to tracheostomy: Secondary | ICD-10-CM | POA: Diagnosis present

## 2020-06-18 DIAGNOSIS — L89899 Pressure ulcer of other site, unspecified stage: Secondary | ICD-10-CM | POA: Insufficient documentation

## 2020-06-18 DIAGNOSIS — L8990 Pressure ulcer of unspecified site, unspecified stage: Secondary | ICD-10-CM | POA: Diagnosis not present

## 2020-06-18 DIAGNOSIS — Z930 Tracheostomy status: Secondary | ICD-10-CM

## 2020-06-18 DIAGNOSIS — C029 Malignant neoplasm of tongue, unspecified: Secondary | ICD-10-CM | POA: Diagnosis not present

## 2020-06-18 DIAGNOSIS — Z79899 Other long term (current) drug therapy: Secondary | ICD-10-CM | POA: Diagnosis not present

## 2020-06-18 DIAGNOSIS — J9509 Other tracheostomy complication: Secondary | ICD-10-CM | POA: Diagnosis not present

## 2020-06-18 NOTE — Progress Notes (Signed)
Reason for visit   Planned trach change  HPI  40 yom w/ SCC of tongue. 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 Just completed his first round of Pembrolizumab and Carboplatin on 4/30 planned for total of 6 cycles. Since last I saw him he was seen by ENT and his trach was changed. He reports today for trach site wound assessment (see picture below) and trach change.  -from a trach stand-point his phonation quality is good -he tolerates PMV -does continue to have trach site discomfort where bottom of trach flange contacts skin.  -no new fever.  -no change in trach secretions -no change in swallowing fxn  Review of Systems  Constitutional: Positive for fever and weight loss. Negative for chills.  HENT: Negative for congestion.        Lots or saliva/frequently has to expectorate   Eyes: Negative.   Respiratory: Negative.   Cardiovascular: Negative for chest pain.  Gastrointestinal: Negative.   Genitourinary: Negative.   Musculoskeletal: Negative.   Skin:       Ulceration (see pic below)  Neurological: Negative.   Endo/Heme/Allergies: Negative.   Psychiatric/Behavioral: Negative.    Subjective  No new issues Objective  General 60 year old aam ambulatory into trach clinic  HENT NCAT his trach stoma site ulceration is slowly improving. Granulation tissue slowly filling in.    (see comparison note on 4/7) Improving slowly.  Pulm: clear  Card rrr abd soft not tender + bowel sounds Neuro intact  Procedure  This current trach was removed. The trach site evaluated and new mepiplex was placed. Given he has not been able to change the internal cannula anyway I felt he could tolerate Portex w/out trouble. In effort to get the trach flange off from his skin and facilitate both comfort and healing of his above wound I changed him to size 6 cuffless Biovona trach.   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 Changed him to 6 biovona to get some of the pressure off his trach site  Plan Cont q 4 week trach change Cont mepiplex dressing q 4-5d Routine trach care  Erick Colace ACNP-BC [place] Pager # [phone] OR # [phone] if no answer

## 2020-06-18 NOTE — Progress Notes (Signed)
Tracheostomy Procedure Note  Alexander [name] [id number] 20-Mar-1960  Pre Procedure Tracheostomy Information  Trach Brand: Shiley Size: 6.0 Legacy Style: Uncuffed Secured by: Velcro   Procedure: Trach Cleaning/ Trach change and Wound Care    Post Procedure Tracheostomy Information  Trach Brand: Portex  Bivona [id number] Size: 6.0 Style: Uncuffed Secured by: Velcro   Post Procedure Evaluation:  ETCO2 positive color change from yellow to purple : Yes.   Vital signs:VSS Patients current condition: stable Complications: No apparent complications Trach site exam: clean Wound care done: dry and clean Patient did tolerate procedure well.   Education: [employer] style education  Prescription needs: none    Additional needs: New PMV and an additional trach given to the patient today  Ref#[id number] Bivona Portex

## 2020-06-18 NOTE — Telephone Encounter (Signed)
I called to schedule a [place] with Dental Medicine. I left message on answering machine for patient to return my call to schedule.

## 2020-06-19 ENCOUNTER — Other Ambulatory Visit (HOSPITAL_COMMUNITY): Payer: Self-pay

## 2020-06-27 ENCOUNTER — Encounter (HOSPITAL_COMMUNITY): Payer: Commercial Managed Care - PPO

## 2020-06-27 ENCOUNTER — Other Ambulatory Visit (HOSPITAL_COMMUNITY): Payer: Self-pay

## 2020-06-27 ENCOUNTER — Ambulatory Visit (HOSPITAL_COMMUNITY): Payer: Commercial Managed Care - PPO

## 2020-07-04 ENCOUNTER — Inpatient Hospital Stay (HOSPITAL_COMMUNITY): Admission: RE | Admit: 2020-07-04 | Payer: Commercial Managed Care - PPO | Source: Ambulatory Visit

## 2020-07-04 ENCOUNTER — Ambulatory Visit (HOSPITAL_COMMUNITY): Payer: Commercial Managed Care - PPO

## 2020-07-05 ENCOUNTER — Telehealth: Payer: Self-pay

## 2020-07-05 ENCOUNTER — Inpatient Hospital Stay: Payer: Commercial Managed Care - PPO | Attending: Hematology and Oncology | Admitting: Hematology and Oncology

## 2020-07-05 ENCOUNTER — Inpatient Hospital Stay: Payer: Commercial Managed Care - PPO

## 2020-07-05 ENCOUNTER — Other Ambulatory Visit (HOSPITAL_COMMUNITY): Payer: Self-pay

## 2020-07-05 ENCOUNTER — Ambulatory Visit: Payer: Commercial Managed Care - PPO | Admitting: Hematology and Oncology

## 2020-07-05 ENCOUNTER — Other Ambulatory Visit: Payer: Commercial Managed Care - PPO

## 2020-07-05 ENCOUNTER — Other Ambulatory Visit: Payer: Self-pay | Admitting: Hematology and Oncology

## 2020-07-05 ENCOUNTER — Encounter: Payer: Self-pay | Admitting: Hematology and Oncology

## 2020-07-05 ENCOUNTER — Other Ambulatory Visit: Payer: Self-pay

## 2020-07-05 DIAGNOSIS — Z5111 Encounter for antineoplastic chemotherapy: Secondary | ICD-10-CM | POA: Diagnosis not present

## 2020-07-05 DIAGNOSIS — G4733 Obstructive sleep apnea (adult) (pediatric): Secondary | ICD-10-CM | POA: Diagnosis not present

## 2020-07-05 DIAGNOSIS — C01 Malignant neoplasm of base of tongue: Secondary | ICD-10-CM | POA: Diagnosis not present

## 2020-07-05 DIAGNOSIS — R5381 Other malaise: Secondary | ICD-10-CM

## 2020-07-05 DIAGNOSIS — G4730 Sleep apnea, unspecified: Secondary | ICD-10-CM | POA: Insufficient documentation

## 2020-07-05 DIAGNOSIS — C109 Malignant neoplasm of oropharynx, unspecified: Secondary | ICD-10-CM

## 2020-07-05 DIAGNOSIS — Z5112 Encounter for antineoplastic immunotherapy: Secondary | ICD-10-CM | POA: Diagnosis present

## 2020-07-05 DIAGNOSIS — Z930 Tracheostomy status: Secondary | ICD-10-CM | POA: Insufficient documentation

## 2020-07-05 DIAGNOSIS — Z79899 Other long term (current) drug therapy: Secondary | ICD-10-CM | POA: Insufficient documentation

## 2020-07-05 LAB — CMP (CANCER CENTER ONLY)
ALT: 32 U/L (ref 0–44)
AST: 24 U/L (ref 15–41)
Albumin: 3.3 g/dL — ABNORMAL LOW (ref 3.5–5.0)
Alkaline Phosphatase: 63 U/L (ref 38–126)
Anion gap: 10 (ref 5–15)
BUN: 9 mg/dL (ref 6–20)
CO2: 26 mmol/L (ref 22–32)
Calcium: 9.4 mg/dL (ref 8.9–10.3)
Chloride: 105 mmol/L (ref 98–111)
Creatinine: 0.96 mg/dL (ref 0.61–1.24)
GFR, Estimated: >60 mL/min (ref >60)
Glucose, Bld: 87 mg/dL (ref 70–99)
Potassium: 3.6 mmol/L (ref 3.5–5.1)
Sodium: 141 mmol/L (ref 135–145)
Total Bilirubin: 0.6 mg/dL (ref 0.3–1.2)
Total Protein: 6.7 g/dL (ref 6.5–8.1)

## 2020-07-05 LAB — CBC WITH DIFFERENTIAL/PLATELET
Abs Immature Granulocytes: 0.01 10*3/uL (ref 0.00–0.07)
Basophils Absolute: 0 10*3/uL (ref 0.0–0.1)
Basophils Relative: 1 %
Eosinophils Absolute: 0.1 10*3/uL (ref 0.0–0.5)
Eosinophils Relative: 2 %
HCT: 35.8 % — ABNORMAL LOW (ref 39.0–52.0)
Hemoglobin: 12.1 g/dL — ABNORMAL LOW (ref 13.0–17.0)
Immature Granulocytes: 0 %
Lymphocytes Relative: 26 %
Lymphs Abs: 1 10*3/uL (ref 0.7–4.0)
MCH: 30.3 pg (ref 26.0–34.0)
MCHC: 33.8 g/dL (ref 30.0–36.0)
MCV: 89.7 fL (ref 80.0–100.0)
Monocytes Absolute: 0.4 10*3/uL (ref 0.1–1.0)
Monocytes Relative: 11 %
Neutro Abs: 2.5 10*3/uL (ref 1.7–7.7)
Neutrophils Relative %: 60 %
Platelets: 227 10*3/uL (ref 150–400)
RBC: 3.99 MIL/uL — ABNORMAL LOW (ref 4.22–5.81)
RDW: 16.1 % — ABNORMAL HIGH (ref 11.5–15.5)
WBC: 4 10*3/uL (ref 4.0–10.5)
nRBC: 0 % (ref 0.0–0.2)

## 2020-07-05 LAB — TSH: TSH: 0.695 u[IU]/mL (ref 0.320–4.118)

## 2020-07-05 LAB — MAGNESIUM: Magnesium: 2 mg/dL (ref 1.7–2.4)

## 2020-07-05 NOTE — Progress Notes (Signed)
[place] FOLLOW UP NOTE  Patient Care Team: Pcp, No as PCP - General [place], Stephani Police, RN as Oncology Nurse Navigator Benay Pike, MD as Consulting Physician (Hematology and Oncology) Rozetta Nunnery, MD as Consulting Physician (Otolaryngology)  CHIEF COMPLAINTS/PURPOSE OF CONSULTATION:  Follow up for metastatic oropharyngeal cancer before planned C2 of chemotherapy  ASSESSMENT & PLAN:  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[place]) This is a very pleasant 60 year old male patient with past medical history significant for obstructive sleep apnea, otherwise in excellent health who was recently diagnosed with squamous cell carcinoma of the base of the tongue, p16 positive, 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  His PET/CT otherwise did not show any evidence of distant metastatic disease besides subcarinal lymphadenopathy.  Since he is of robust performance status and very motivated, we have discussed about frontline chemoimmunotherapy for 2 or 3 cycles and reevaluate for possible concurrent chemoradiation course depending on his response and his tolerance.  He understands that this is not the typical approach and distant metastatic disease but he is very motivated to try and do his best since he has never been sick prior to this. Started C1D1 on 4/26. He has tolerated first cycle of chemotherapy really well, no adverse effects.  We will plan to proceed with cycle 2 of chemotherapy with the same dose.  Plan is to repeat imaging after 3 cycles of chemotherapy and actually discuss him in the tumor board about consideration for concurrent chemoradiation for the neck and palliative radiation to the chest.   He understands this is not a standard of care approach and we can't still promise him that this is curable despite aggressive treatment.  He  is however willing to proceed with all the recommendations. Okay to proceed with chemotherapy if labs from today are within parameters.  Tracheostomy dependence ([place]) He needed urgent tracheostomy because of difficult airway secondary to his malignancy.  He just says it is uncomfortable and needs change of trach every few weeks. Otherwise no issues, concerns for infection  Orders Placed This Encounter  Procedures  . CBC with Differential/Platelet    Standing Status:   Standing    Number of Occurrences:   22    Standing Expiration Date:   07/05/2021  . CMP ([place] only)    Standing Status:   Future    Standing Expiration Date:   07/05/2021  . Magnesium    Standing Status:   Future    Standing Expiration Date:   07/05/2021     HISTORY OF PRESENTING ILLNESS:  Alexander [name] 60 y.o. 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[place])  05/17/2020 Initial Diagnosis   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[place])   05/18/2020 Cancer Staging   Staging form: Pharynx - HPV-Mediated Oropharynx, AJCC 8th Edition - Clinical stage from 05/18/2020: cT4, cN3, p16+ - Signed by Eppie Gibson, MD on 05/22/2020   06/01/2020 Cancer Staging   Staging form: Pharynx - HPV-Mediated Oropharynx, AJCC 8th Edition - Pathologic stage from 06/01/2020: No Stage Recommended (cT4, cN3, cM1, p16+) - Signed by Benay Pike, MD on 06/01/2020 Stage prefix: Initial diagnosis   06/12/2020 -  Chemotherapy    Patient is on Treatment Plan: HEAD/NECK PEMBROLIZUMAB + CARBOPLATIN + 5FU Q21D X 6 CYCLES / PEMBROLIZUMAB Q21D  INTERIM HISTORY  Patient is here for a follow up.  He is here for follow-up before cycle 2 of chemotherapy.  He has been doing really well.  He denies any complaints after cycle 1 of chemotherapy.  No fatigue, nausea, vomiting, diarrhea.  He has been eating well all along, may have gained a couple pounds.  No pain.  From his cancer  standpoint, he thinks the neck masses have become a bit softer and his face looks a little bit less swollen.  No chest pain, cough or shortness of breath.  Overall no toxicity from the chemotherapy. Rest of the pertinent 10 point ROS reviewed and negative.  REVIEW OF SYSTEMS:   Constitutional: Denies fevers, chills or abnormal night sweats Eyes: Denies blurriness of vision, double vision or watery eyes Ears, nose, mouth, throat, and face: Denies mucositis or sore throat Respiratory: Denies cough, dyspnea or wheezes Cardiovascular: Denies palpitation, chest discomfort or lower extremity swelling Gastrointestinal:  Denies nausea, heartburn or change in bowel habits Skin: Denies abnormal skin rashes Lymphatics: Denies new lymphadenopathy or easy bruising Neurological:Denies numbness, tingling or new weaknesses Behavioral/Psych: Mood is stable, no new changes  All other systems were reviewed with the patient and are negative.   MEDICAL HISTORY:  Past Medical History:  Diagnosis Date  . Asthma   . Cancer ([place])   . GERD (gastroesophageal reflux disease)   . Sleep apnea    CPAP- does not wear    SURGICAL HISTORY: Past Surgical History:  Procedure Laterality Date  . BRONCHIAL NEEDLE ASPIRATION BIOPSY  05/22/2020   Procedure: BRONCHIAL NEEDLE ASPIRATION BIOPSIES;  Surgeon: Garner Nash, DO;  Location: [facility];  Service: Pulmonary;;  . DIRECT LARYNGOSCOPY  05/09/2020   Procedure: DIRECT LARYNGOSCOPY;  Surgeon: Rozetta Nunnery, MD;  Location: [facility];  Service: ENT;;  . IR IMAGING GUIDED PORT INSERTION  06/12/2020  . MASS EXCISION N/A 05/09/2020   Procedure: EXCISION TONGUE MASS;  Surgeon: Rozetta Nunnery, MD;  Location: [facility];  Service: ENT;  Laterality: N/A;  . TONSILLECTOMY    . TRACHEOSTOMY TUBE PLACEMENT  05/09/2020   Procedure: TRACHEOSTOMY;  Surgeon: Rozetta Nunnery, MD;  Location: [facility];  Service: ENT;;  . VIDEO BRONCHOSCOPY WITH ENDOBRONCHIAL ULTRASOUND N/A  05/22/2020   Procedure: VIDEO BRONCHOSCOPY WITH ENDOBRONCHIAL ULTRASOUND;  Surgeon: Garner Nash, DO;  Location: [facility];  Service: Pulmonary;  Laterality: N/A;    SOCIAL HISTORY: Social History   Socioeconomic History  . Marital status: Single    Spouse name: Not on file  . Number of children: Not on file  . Years of education: Not on file  . Highest education level: Not on file  Occupational History  . Not on file  Tobacco Use  . Smoking status: Never Smoker  . Smokeless tobacco: Never Used  Vaping Use  . Vaping Use: Never used  Substance and Sexual Activity  . Alcohol use: Not Currently    Comment: "4-5 drinks a year for special ocassions" 05/08/20  . Drug use: Never  . Sexual activity: Not Currently  Other Topics Concern  . Not on file  Social History Narrative  . Not on file   Social Determinants of Health   Financial Resource Strain: Low Risk   . Difficulty of Paying Living Expenses: Not hard at all  Food Insecurity: No Food Insecurity  . Worried About Charity fundraiser in the Last Year: Never true  . Ran Out of Food in the Last Year: Never true  Transportation Needs: No Transportation Needs  . Lack of Transportation (Medical): No  . Lack of Transportation (Non-Medical): No  Physical Activity: Not on file  Stress: No Stress Concern Present  . Feeling of Stress : Not at all  Social Connections: Socially Isolated  . Frequency of Communication with Friends and Family: More than three times a week  . Frequency of Social Gatherings with Friends and Family: More than three times a week  . Attends Religious Services: Never  . Active Member of Clubs or Organizations: No  . Attends BankerClub or Organization Meetings: Never  . Marital Status: Separated  Intimate Partner Violence: Not on file    FAMILY HISTORY: Family History  Problem Relation Age of Onset  . Breast cancer Mother   . Cancer - Cervical Mother   . Colon cancer Father   . Colon cancer Brother      ALLERGIES:  has No Known Allergies.  MEDICATIONS:  Current Outpatient Medications  Medication Sig Dispense Refill  . albuterol (PROVENTIL) (2.5 MG/3ML) 0.083% nebulizer solution Take 3 mLs (2.5 mg total) by nebulization every 6 (six) hours as needed for wheezing or shortness of breath. 120 mL 12  . albuterol (VENTOLIN HFA) 108 (90 Base) MCG/ACT inhaler Inhale 2 puffs into the lungs as needed for wheezing or shortness of breath.    . bacitracin ointment Apply 1 application topically every 8 (eight) hours. Apply around trach wound 120 g 0  . cetirizine (ZYRTEC) 10 MG tablet Take 10 mg by mouth daily.    [name]. dexamethasone (DECADRON) 4 MG tablet Take 2 tablets (8 mg total) by mouth daily. Start the day after carboplatin chemotherapy for 3 days. 30 tablet 1  . fluticasone (FLONASE) 50 MCG/ACT nasal spray Place 2 sprays into both nostrils daily. Place 1 spray each nostril at night 16 g 11  . Fluticasone-Salmeterol (ADVAIR) 100-50 MCG/DOSE AEPB Inhale 1 puff into the lungs daily.    [name]. lidocaine-prilocaine (EMLA) cream Apply to affected area once as directed 30 g 3  . LORazepam (ATIVAN) 0.5 MG tablet Take 1 tablet (0.5 mg total) by mouth every 6 (six) hours as needed (Nausea or vomiting). 30 tablet 0  . omeprazole (PRILOSEC) 40 MG capsule Take 40 mg by mouth daily.    . ondansetron (ZOFRAN) 8 MG tablet Take 1 tablet (8 mg total) by mouth 2 (two) times daily as needed for refractory nausea / vomiting. Start on day 3 after carboplatin chemo. 30 tablet 1  . prochlorperazine (COMPAZINE) 10 MG tablet Take 1 tablet (10 mg total) by mouth every 6 (six) hours as needed for nausea or vomiting. 30 tablet 1   No current facility-administered medications for this visit.    PHYSICAL EXAMINATION: ECOG PERFORMANCE STATUS: 0 - Asymptomatic  Vitals:   07/05/20 1237  BP: 113/73  Pulse: 85  Resp: 20  Temp: 97.9 F (36.6 C)  SpO2: 100%   Filed Weights   07/05/20 1237  Weight: 275 lb 12.8 oz (125.1 kg)     GENERAL:alert, no distress and comfortable SKIN: skin color, texture, turgor are normal, no rashes or significant lesions EYES: normal, conjunctiva are pink and non-injected, sclera clear OROPHARYNX:no exudate, no erythema and lips, buccal mucosa, and tongue normal  NECK: Bilateral cervical lymphadenopathy less prominent on exam today.  Trach site appears to be normal. LUNGS: clear to auscultation and percussion with normal breathing effort HEART: regular rate & rhythm and no murmurs and no lower extremity edema ABDOMEN:abdomen soft, non-tender and normal bowel sounds  Musculoskeletal:no cyanosis of digits and no clubbing  PSYCH: alert & oriented x 3 with fluent speech NEURO: no focal motor/sensory deficits  LABORATORY DATA:  I have reviewed the data as listed Lab Results  Component Value Date   WBC 11.1 (H) 05/30/2020   HGB 12.2 (L) 05/30/2020   HCT 35.9 (L) 05/30/2020   MCV 89.1 05/30/2020   PLT 185 05/30/2020     Chemistry      Component Value Date/Time   NA 141 06/12/2020 1220   K 3.6 06/12/2020 1220   CL 104 06/12/2020 1220   CO2 28 06/12/2020 1220   BUN 7 06/12/2020 1220   CREATININE 0.86 06/12/2020 1220      Component Value Date/Time   CALCIUM 9.2 06/12/2020 1220   ALKPHOS 60 06/12/2020 1220   AST 33 06/12/2020 1220   ALT 62 (H) 06/12/2020 1220   BILITOT 0.6 06/12/2020 1220       RADIOGRAPHIC STUDIES: I have personally reviewed the radiological images as listed and agreed with the findings in the report. IR IMAGING GUIDED PORT INSERTION  Result Date: 06/12/2020 INDICATION: Squamous cell carcinoma of the tongue base EXAM: IMPLANTED PORT A CATH PLACEMENT WITH ULTRASOUND AND FLUOROSCOPIC GUIDANCE MEDICATIONS: None ANESTHESIA/SEDATION: Moderate (conscious) sedation was employed during this procedure. A total of Versed 2 mg and Fentanyl 100 mcg was administered intravenously. Moderate Sedation Time: 25 minutes. The patient's level of consciousness and vital signs  were monitored continuously by radiology nursing throughout the procedure under my direct supervision. FLUOROSCOPY TIME:  Two minutes, 6 seconds (32 mGy) COMPLICATIONS: None immediate. PROCEDURE: The procedure, risks, benefits, and alternatives were explained to the patient. Questions regarding the procedure were encouraged and answered. The patient understands and consents to the procedure. A timeout was performed prior to the initiation of the procedure. Patient positioned supine on the angiography table. Right neck and anterior upper chest prepped and draped in the usual sterile fashion. All elements of maximal sterile barrier were utilized including, cap, mask, sterile gown, sterile gloves, large sterile drape, hand scrubbing and 2% Chlorhexidine for skin cleaning. The right internal jugular vein was evaluated with ultrasound and shown to be diminutive in nearly occluded. The right external jugular vein was patent. A permanent ultrasound image was obtained and placed in the patient's medical record. Local anesthesia was provided with 1% lidocaine with epinephrine. Using sterile gel and a sterile probe cover, the right external jugular vein was entered with a 21 ga needle during real time ultrasound guidance. 0.018 inch guidewire placed and 21 ga needle exchanged for transitional dilator set. Utilizing fluoroscopy, 0.035 inch guidewire advanced through the needle without difficulty. Attention then turned to the right anterior upper chest. Following local lidocaine administration, a port pocket was created. The catheter was connected to the port and brought from the pocket to the venotomy site through a subcutaneous tunnel. The catheter was cut to size and inserted through the peel-away sheath. The catheter tip was positioned at the cavoatrial junction using fluoroscopic guidance. The port aspirated and flushed well. The port pocket was closed with deep and superficial absorbable suture. The port pocket incision  and venotomy sites were also sealed with Dermabond. IMPRESSION: Successful placement of a right external jugular approach power injectable Port-A-Cath. The catheter is ready for immediate use. Right internal jugular vein is diminutive. Electronically Signed   By: Miachel Roux M.D.   On: 06/12/2020 13:53    All questions were answered. The patient knows to call the clinic with any problems,  questions or concerns.     Benay Pike, MD 07/05/2020 1:15 PM

## 2020-07-05 NOTE — Telephone Encounter (Signed)
Spoke with Alexander [name] regarding his upcoming appointments. Confirmed with patient that he would be able to see Dr. Chryl Heck today and have labs drawn.  Regarding past appointments, able to confirm with patient that his  chemo pump had been taken off back on a Saturday in April by [facility] staff nurses.

## 2020-07-05 NOTE — Assessment & Plan Note (Addendum)
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  His PET/CT otherwise did not show any evidence of distant metastatic disease besides subcarinal lymphadenopathy.  Since he is of robust performance status and very motivated, we have discussed about frontline chemoimmunotherapy for 2 or 3 cycles and reevaluate for possible concurrent chemoradiation course depending on his response and his tolerance.  He understands that this is not the typical approach and distant metastatic disease but he is very motivated to try and do his best since he has never been sick prior to this. Started C1D1 on 4/26. He has tolerated first cycle of chemotherapy really well, no adverse effects.  We will plan to proceed with cycle 2 of chemotherapy with the same dose.  Plan is to repeat imaging after 3 cycles of chemotherapy and actually discuss him in the tumor board about consideration for concurrent chemoradiation for the neck and palliative radiation to the chest.   He understands this is not a standard of care approach and we can't still promise him that this is curable despite aggressive treatment.  He is however willing to proceed with all the recommendations. Okay to proceed with chemotherapy if labs from today are within parameters.

## 2020-07-05 NOTE — Assessment & Plan Note (Signed)
He needed urgent tracheostomy because of difficult airway secondary to his malignancy.  He just says it is uncomfortable and needs change of trach every few weeks. Otherwise no issues, concerns for infection

## 2020-07-06 ENCOUNTER — Inpatient Hospital Stay: Payer: Commercial Managed Care - PPO

## 2020-07-06 ENCOUNTER — Other Ambulatory Visit: Payer: Self-pay | Admitting: Hematology and Oncology

## 2020-07-06 VITALS — BP 110/75 | HR 85 | Temp 98.2°F | Resp 18

## 2020-07-06 DIAGNOSIS — C01 Malignant neoplasm of base of tongue: Secondary | ICD-10-CM

## 2020-07-06 DIAGNOSIS — Z5111 Encounter for antineoplastic chemotherapy: Secondary | ICD-10-CM | POA: Diagnosis not present

## 2020-07-06 MED ORDER — SODIUM CHLORIDE 0.9 % IV SOLN
150.0000 mg | Freq: Once | INTRAVENOUS | Status: AC
  Administered 2020-07-06: 150 mg via INTRAVENOUS
  Filled 2020-07-06: qty 150

## 2020-07-06 MED ORDER — PALONOSETRON HCL INJECTION 0.25 MG/5ML
INTRAVENOUS | Status: AC
  Filled 2020-07-06: qty 5

## 2020-07-06 MED ORDER — SODIUM CHLORIDE 0.9 % IV SOLN
200.0000 mg | Freq: Once | INTRAVENOUS | Status: AC
  Administered 2020-07-06: 200 mg via INTRAVENOUS
  Filled 2020-07-06: qty 8

## 2020-07-06 MED ORDER — SODIUM CHLORIDE 0.9 % IV SOLN
Freq: Once | INTRAVENOUS | Status: AC
  Filled 2020-07-06: qty 250

## 2020-07-06 MED ORDER — SODIUM CHLORIDE 0.9 % IV SOLN
960.0000 mg/m2/d | INTRAVENOUS | Status: DC
  Administered 2020-07-06: 10000 mg via INTRAVENOUS
  Filled 2020-07-06: qty 200

## 2020-07-06 MED ORDER — SODIUM CHLORIDE 0.9 % IV SOLN
10.0000 mg | Freq: Once | INTRAVENOUS | Status: AC
Start: 2020-07-06 — End: 2020-07-06
  Administered 2020-07-06: 10 mg via INTRAVENOUS
  Filled 2020-07-06: qty 10

## 2020-07-06 MED ORDER — PALONOSETRON HCL INJECTION 0.25 MG/5ML
0.2500 mg | Freq: Once | INTRAVENOUS | Status: AC
  Administered 2020-07-06: 0.25 mg via INTRAVENOUS

## 2020-07-06 MED ORDER — SODIUM CHLORIDE 0.9% FLUSH
10.0000 mL | INTRAVENOUS | Status: DC | PRN
  Filled 2020-07-06: qty 10

## 2020-07-06 MED ORDER — SODIUM CHLORIDE 0.9 % IV SOLN
750.0000 mg | Freq: Once | INTRAVENOUS | Status: AC
  Administered 2020-07-06: 750 mg via INTRAVENOUS
  Filled 2020-07-06: qty 75

## 2020-07-06 NOTE — Patient Instructions (Signed)
[facility]ICAL [facility]COL[facility]  Discharge Instructions: Thank you for choosing [facility] to provide your oncology and hematology care.   If you have a lab appointment with the Cancer Center, please go directly to the Cancer Center and check in at the registration area.   Wear comfortable clothing and clothing appropriate for easy access to any Portacath or PICC line.   We strive to give you quality time with your provider. You may need to reschedule your appointment if you arrive late (15 or more minutes).  Arriving late affects you and other patients whose appointments are after yours.  Also, if you miss three or more appointments without notifying the office, you may be dismissed from the clinic at the provider's discretion.      For prescription refill requests, have your pharmacy contact our office and allow 72 hours for refills to be completed.    Today you received the following chemotherapy and/or immunotherapy agents: Keytruda, Carboplatin, 5FU     To help prevent nausea and vomiting after your treatment, we encourage you to take your nausea medication as directed.  BELOW ARE SYMPTOMS THAT SHOULD BE REPORTED IMMEDIATELY: *FEVER GREATER THAN 100.4 F (38 C) OR HIGHER *CHILLS OR SWEATING *NAUSEA AND VOMITING THAT IS NOT CONTROLLED WITH YOUR NAUSEA MEDICATION *UNUSUAL SHORTNESS OF BREATH *UNUSUAL BRUISING OR BLEEDING *URINARY PROBLEMS (pain or burning when urinating, or frequent urination) *BOWEL PROBLEMS (unusual diarrhea, constipation, pain near the anus) TENDERNESS IN MOUTH AND THROAT WITH OR WITHOUT PRESENCE OF ULCERS (sore throat, sores in mouth, or a toothache) UNUSUAL RASH, SWELLING OR PAIN  UNUSUAL VAGINAL DISCHARGE OR ITCHING   Items with * indicate a potential emergency and should be followed up as soon as possible or go to the Emergency Department if any problems should occur.  Please show the CHEMOTHERAPY ALERT CARD or IMMUNOTHERAPY ALERT CARD  at check-in to the Emergency Department and triage nurse.  Should you have questions after your visit or need to cancel or reschedule your appointment, please contact [facility]  Dept: [phone]  and follow the prompts.  Office hours are 8:00 a.m. to 4:30 p.m. Monday - Friday. Please note that voicemails left after 4:00 p.m. may not be returned until the following business day.  We are closed weekends and major holidays. You have access to a nurse at all times for urgent questions. Please call the main number to the clinic Dept: [phone] and follow the prompts.   For any non-urgent questions, you may also contact your provider using MyChart. We now offer e-Visits for anyone 18 and older to request care online for non-urgent symptoms. For details visit [email or web address].   Also download the MyChart app! Go to the app store, search "MyChart", open the app, select [facility], and log in with your MyChart username and password.  Due to Covid, a mask is required upon entering the hospital/clinic. If you do not have a mask, one will be given to you upon arrival. For doctor visits, patients may have 1 support person aged 18 or older with them. For treatment visits, patients cannot have anyone with them due to current Covid guidelines and our immunocompromised population.   Pembrolizumab injection What is this medicine? PEMBROLIZUMAB (pem broe liz ue mab) is a monoclonal antibody. It is used to treat certain types of cancer. This medicine may be used for other purposes; ask your health care provider or pharmacist if you have questions. COMMON BRAND NAME(S): Keytruda   or pharmacist if you have questions. COMMON BRAND NAME(S): Keytruda What should I tell my health care provider before I take this medicine? They need to know if you have any of these conditions:  autoimmune diseases like Crohn's disease, ulcerative colitis, or lupus  have had or planning to have an allogeneic stem cell transplant (uses  someone else's stem cells)  history of organ transplant  history of chest radiation  nervous system problems like myasthenia gravis or Guillain-Barre syndrome  an unusual or allergic reaction to pembrolizumab, other medicines, foods, dyes, or preservatives  pregnant or trying to get pregnant  breast-feeding How should I use this medicine? This medicine is for infusion into a vein. It is given by a health care professional in a hospital or clinic setting. A special MedGuide will be given to you before each treatment. Be sure to read this information carefully each time. Talk to your pediatrician regarding the use of this medicine in children. While this drug may be prescribed for children as young as 6 months for selected conditions, precautions do apply. Overdosage: If you think you have taken too much of this medicine contact a poison control center or emergency room at once. NOTE: This medicine is only for you. Do not share this medicine with others. What if I miss a dose? It is important not to miss your dose. Call your doctor or health care professional if you are unable to keep an appointment. What may interact with this medicine? Interactions have not been studied. This list may not describe all possible interactions. Give your health care provider a list of all the medicines, herbs, non-prescription drugs, or dietary supplements you use. Also tell them if you smoke, drink alcohol, or use illegal drugs. Some items may interact with your medicine. What should I watch for while using this medicine? Your condition will be monitored carefully while you are receiving this medicine. You may need blood work done while you are taking this medicine. Do not become pregnant while taking this medicine or for 4 months after stopping it. Women should inform their doctor if they wish to become pregnant or think they might be pregnant. There is a potential for serious side effects to an unborn  child. Talk to your health care professional or pharmacist for more information. Do not breast-feed an infant while taking this medicine or for 4 months after the last dose. What side effects may I notice from receiving this medicine? Side effects that you should report to your doctor or health care professional as soon as possible:  allergic reactions like skin rash, itching or hives, swelling of the face, lips, or tongue  bloody or black, tarry  breathing problems  changes in vision  chest pain  chills  confusion  constipation  cough  diarrhea  dizziness or feeling faint or lightheaded  fast or irregular heartbeat  fever  flushing  joint pain  low blood counts - this medicine may decrease the number of white blood cells, red blood cells and platelets. You may be at increased risk for infections and bleeding.  muscle pain  muscle weakness  pain, tingling, numbness in the hands or feet  persistent headache  redness, blistering, peeling or loosening of the skin, including inside the mouth  signs and symptoms of high blood sugar such as dizziness; dry mouth; dry skin; fruity breath; nausea; stomach pain; increased hunger or thirst; increased urination  signs and symptoms of kidney injury like trouble passing urine or change in  require medical attention (report to your doctor or health care professional if they continue or are bothersome): decreased appetite hair loss tiredness This list may not describe all possible side effects. Call your doctor for medical advice about side effects. You may report side effects to FDA at 1-800-FDA-1088. Where should I keep my medicine? This drug is given in a hospital or clinic and will not be stored at home. NOTE: This sheet is a summary. It may not cover  all possible information. If you have questions about this medicine, talk to your doctor, pharmacist, or health care provider.  2021 Elsevier/Gold Standard (2019-01-05 21:44:53)  Carboplatin injection What is this medicine? CARBOPLATIN (KAR boe pla tin) is a chemotherapy drug. It targets fast dividing cells, like cancer cells, and causes these cells to die. This medicine is used to treat ovarian cancer and many other cancers. This medicine may be used for other purposes; ask your health care provider or pharmacist if you have questions. COMMON BRAND NAME(S): Paraplatin What should I tell my health care provider before I take this medicine? They need to know if you have any of these conditions: blood disorders hearing problems kidney disease recent or ongoing radiation therapy an unusual or allergic reaction to carboplatin, cisplatin, other chemotherapy, other medicines, foods, dyes, or preservatives pregnant or trying to get pregnant breast-feeding How should I use this medicine? This drug is usually given as an infusion into a vein. It is administered in a hospital or clinic by a specially trained health care professional. Talk to your pediatrician regarding the use of this medicine in children. Special care may be needed. Overdosage: If you think you have taken too much of this medicine contact a poison control center or emergency room at once. NOTE: This medicine is only for you. Do not share this medicine with others. What if I miss a dose? It is important not to miss a dose. Call your doctor or health care professional if you are unable to keep an appointment. What may interact with this medicine? medicines for seizures medicines to increase blood counts like filgrastim, pegfilgrastim, sargramostim some antibiotics like amikacin, gentamicin, neomycin, streptomycin, tobramycin vaccines Talk to your doctor or health care professional before taking any of these  medicines: acetaminophen aspirin ibuprofen ketoprofen naproxen This list may not describe all possible interactions. Give your health care provider a list of all the medicines, herbs, non-prescription drugs, or dietary supplements you use. Also tell them if you smoke, drink alcohol, or use illegal drugs. Some items may interact with your medicine. What should I watch for while using this medicine? Your condition will be monitored carefully while you are receiving this medicine. You will need important blood work done while you are taking this medicine. This drug may make you feel generally unwell. This is not uncommon, as chemotherapy can affect healthy cells as well as cancer cells. Report any side effects. Continue your course of treatment even though you feel ill unless your doctor tells you to stop. In some cases, you may be given additional medicines to help with side effects. Follow all directions for their use. Call your doctor or health care professional for advice if you get a fever, chills or sore throat, or other symptoms of a cold or flu. Do not treat yourself. This drug decreases your body's ability to fight infections. Try to avoid being around people who are sick. This medicine may increase your risk to bruise or bleed. Call your doctor or health care professional   if you notice any unusual bleeding. Be careful brushing and flossing your teeth or using a toothpick because you may get an infection or bleed more easily. If you have any dental work done, tell your dentist you are receiving this medicine. Avoid taking products that contain aspirin, acetaminophen, ibuprofen, naproxen, or ketoprofen unless instructed by your doctor. These medicines may hide a fever. Do not become pregnant while taking this medicine. Women should inform their doctor if they wish to become pregnant or think they might be pregnant. There is a potential for serious side effects to an unborn child. Talk to your  health care professional or pharmacist for more information. Do not breast-feed an infant while taking this medicine. What side effects may I notice from receiving this medicine? Side effects that you should report to your doctor or health care professional as soon as possible: allergic reactions like skin rash, itching or hives, swelling of the face, lips, or tongue signs of infection - fever or chills, cough, sore throat, pain or difficulty passing urine signs of decreased platelets or bleeding - bruising, pinpoint red spots on the skin, black, tarry stools, nosebleeds signs of decreased red blood cells - unusually weak or tired, fainting spells, lightheadedness breathing problems changes in hearing changes in vision chest pain high blood pressure low blood counts - This drug may decrease the number of white blood cells, red blood cells and platelets. You may be at increased risk for infections and bleeding. nausea and vomiting pain, swelling, redness or irritation at the injection site pain, tingling, numbness in the hands or feet problems with balance, talking, walking trouble passing urine or change in the amount of urine Side effects that usually do not require medical attention (report to your doctor or health care professional if they continue or are bothersome): hair loss loss of appetite metallic taste in the mouth or changes in taste This list may not describe all possible side effects. Call your doctor for medical advice about side effects. You may report side effects to FDA at 1-800-FDA-1088. Where should I keep my medicine? This drug is given in a hospital or clinic and will not be stored at home. NOTE: This sheet is a summary. It may not cover all possible information. If you have questions about this medicine, talk to your doctor, pharmacist, or health care provider.  2021 Elsevier/Gold Standard (2007-05-11 14:38:05)  Fluorouracil, 5-FU injection What is this  medicine? FLUOROURACIL, 5-FU (flure oh YOOR a sil) is a chemotherapy drug. It slows the growth of cancer cells. This medicine is used to treat many types of cancer like breast cancer, colon or rectal cancer, pancreatic cancer, and stomach cancer. This medicine may be used for other purposes; ask your health care provider or pharmacist if you have questions. COMMON BRAND NAME(S): Adrucil What should I tell my health care provider before I take this medicine? They need to know if you have any of these conditions: blood disorders dihydropyrimidine dehydrogenase (DPD) deficiency infection (especially a virus infection such as chickenpox, cold sores, or herpes) kidney disease liver disease malnourished, poor nutrition recent or ongoing radiation therapy an unusual or allergic reaction to fluorouracil, other chemotherapy, other medicines, foods, dyes, or preservatives pregnant or trying to get pregnant breast-feeding How should I use this medicine? This drug is given as an infusion or injection into a vein. It is administered in a hospital or clinic by a specially trained health care professional. Talk to your pediatrician regarding the use of this medicine   in children. Special care may be needed. Overdosage: If you think you have taken too much of this medicine contact a poison control center or emergency room at once. NOTE: This medicine is only for you. Do not share this medicine with others. What if I miss a dose? It is important not to miss your dose. Call your doctor or health care professional if you are unable to keep an appointment. What may interact with this medicine? Do not take this medicine with any of the following medications: live virus vaccines This medicine may also interact with the following medications: medicines that treat or prevent blood clots like warfarin, enoxaparin, and dalteparin This list may not describe all possible interactions. Give your health care provider a  list of all the medicines, herbs, non-prescription drugs, or dietary supplements you use. Also tell them if you smoke, drink alcohol, or use illegal drugs. Some items may interact with your medicine. What should I watch for while using this medicine? Visit your doctor for checks on your progress. This drug may make you feel generally unwell. This is not uncommon, as chemotherapy can affect healthy cells as well as cancer cells. Report any side effects. Continue your course of treatment even though you feel ill unless your doctor tells you to stop. In some cases, you may be given additional medicines to help with side effects. Follow all directions for their use. Call your doctor or health care professional for advice if you get a fever, chills or sore throat, or other symptoms of a cold or flu. Do not treat yourself. This drug decreases your body's ability to fight infections. Try to avoid being around people who are sick. This medicine may increase your risk to bruise or bleed. Call your doctor or health care professional if you notice any unusual bleeding. Be careful brushing and flossing your teeth or using a toothpick because you may get an infection or bleed more easily. If you have any dental work done, tell your dentist you are receiving this medicine. Avoid taking products that contain aspirin, acetaminophen, ibuprofen, naproxen, or ketoprofen unless instructed by your doctor. These medicines may hide a fever. Do not become pregnant while taking this medicine. Women should inform their doctor if they wish to become pregnant or think they might be pregnant. There is a potential for serious side effects to an unborn child. Talk to your health care professional or pharmacist for more information. Do not breast-feed an infant while taking this medicine. Men should inform their doctor if they wish to father a child. This medicine may lower sperm counts. Do not treat diarrhea with over the counter  products. Contact your doctor if you have diarrhea that lasts more than 2 days or if it is severe and watery. This medicine can make you more sensitive to the sun. Keep out of the sun. If you cannot avoid being in the sun, wear protective clothing and use sunscreen. Do not use sun lamps or tanning beds/booths. What side effects may I notice from receiving this medicine? Side effects that you should report to your doctor or health care professional as soon as possible: allergic reactions like skin rash, itching or hives, swelling of the face, lips, or tongue low blood counts - this medicine may decrease the number of white blood cells, red blood cells and platelets. You may be at increased risk for infections and bleeding. signs of infection - fever or chills, cough, sore throat, pain or difficulty passing urine signs of   decreased platelets or bleeding - bruising, pinpoint red spots on the skin, black, tarry stools, blood in the urine signs of decreased red blood cells - unusually weak or tired, fainting spells, lightheadedness breathing problems changes in vision chest pain mouth sores nausea and vomiting pain, swelling, redness at site where injected pain, tingling, numbness in the hands or feet redness, swelling, or sores on hands or feet stomach pain unusual bleeding Side effects that usually do not require medical attention (report to your doctor or health care professional if they continue or are bothersome): changes in finger or toe nails diarrhea dry or itchy skin hair loss headache loss of appetite sensitivity of eyes to the light stomach upset unusually teary eyes This list may not describe all possible side effects. Call your doctor for medical advice about side effects. You may report side effects to FDA at 1-800-FDA-1088. Where should I keep my medicine? This drug is given in a hospital or clinic and will not be stored at home. NOTE: This sheet is a summary. It may not  cover all possible information. If you have questions about this medicine, talk to your doctor, pharmacist, or health care provider.  2021 Elsevier/Gold Standard (2019-01-04 15:00:03)   

## 2020-07-09 ENCOUNTER — Telehealth (HOSPITAL_COMMUNITY): Payer: Self-pay

## 2020-07-09 NOTE — Telephone Encounter (Signed)
Called and spoke with patient to reschedule OP MBS and patient stated he is waiting on authorization from his insurance. He has spoke with Anderson Malta from cancer center. Once he hears regarding his insurance, he will call to reschedule. Will continue to follow.

## 2020-07-10 ENCOUNTER — Other Ambulatory Visit: Payer: Self-pay

## 2020-07-10 ENCOUNTER — Telehealth: Payer: Self-pay | Admitting: Pulmonary Disease

## 2020-07-10 ENCOUNTER — Inpatient Hospital Stay: Payer: Commercial Managed Care - PPO

## 2020-07-10 VITALS — BP 94/65 | HR 91 | Temp 98.3°F | Resp 16

## 2020-07-10 DIAGNOSIS — C01 Malignant neoplasm of base of tongue: Secondary | ICD-10-CM

## 2020-07-10 DIAGNOSIS — Z5111 Encounter for antineoplastic chemotherapy: Secondary | ICD-10-CM | POA: Diagnosis not present

## 2020-07-10 MED ORDER — HEPARIN SOD (PORK) LOCK FLUSH 100 UNIT/ML IV SOLN
500.0000 [IU] | Freq: Once | INTRAVENOUS | Status: AC | PRN
  Administered 2020-07-10: 500 [IU]
  Filled 2020-07-10: qty 5

## 2020-07-10 MED ORDER — SODIUM CHLORIDE 0.9% FLUSH
10.0000 mL | INTRAVENOUS | Status: DC | PRN
  Administered 2020-07-10: 10 mL
  Filled 2020-07-10: qty 10

## 2020-07-10 NOTE — Progress Notes (Signed)
Patient presented to infusion room for pump d/c. Noted him to be slightly hypotensive. Patient reports eating/drinking well. Denies any symptoms associated with lower than normal BP. Patient states he feels well and wishes to be discharged home. Informed patient to call office or go to nearest ED if symptoms develop. Patient verbalized understanding.

## 2020-07-10 NOTE — Telephone Encounter (Signed)
BI is not back in the office until 06/01.  If we receive the forms that needs to be signed we can give them to him at that time and can fax them once they have been signed. Will hold in my box and send to [facility] as an [place].

## 2020-07-10 NOTE — Telephone Encounter (Signed)
Please leave in my folder and I will sign   Garner Nash, DO [facility] 07/10/2020 11:32 AM

## 2020-07-17 ENCOUNTER — Telehealth (HOSPITAL_COMMUNITY): Payer: Self-pay

## 2020-07-17 NOTE — Telephone Encounter (Signed)
Called patient to reschedule OP MBS as Anderson Malta called with Auth # for patient to receive OP MBS - patient stated he does not have his calendar in front of him at this time and will call back in an hour

## 2020-07-18 ENCOUNTER — Other Ambulatory Visit (HOSPITAL_COMMUNITY)
Admission: RE | Admit: 2020-07-18 | Discharge: 2020-07-18 | Disposition: A | Discharge status: Home / Self Care | Payer: Commercial Managed Care - PPO | Source: Ambulatory Visit | Attending: Hematology and Oncology | Admitting: Hematology and Oncology

## 2020-07-18 ENCOUNTER — Encounter: Payer: Self-pay | Admitting: Hematology and Oncology

## 2020-07-18 ENCOUNTER — Other Ambulatory Visit: Payer: Self-pay | Admitting: Hematology and Oncology

## 2020-07-18 DIAGNOSIS — Z01812 Encounter for preprocedural laboratory examination: Secondary | ICD-10-CM | POA: Diagnosis not present

## 2020-07-18 DIAGNOSIS — Z20822 Contact with and (suspected) exposure to covid-19: Secondary | ICD-10-CM | POA: Diagnosis not present

## 2020-07-18 DIAGNOSIS — C01 Malignant neoplasm of base of tongue: Secondary | ICD-10-CM

## 2020-07-18 LAB — SARS CORONAVIRUS 2 (TAT 6-24 HRS): SARS Coronavirus 2: NEGATIVE

## 2020-07-20 ENCOUNTER — Other Ambulatory Visit: Payer: Self-pay

## 2020-07-20 ENCOUNTER — Ambulatory Visit (HOSPITAL_COMMUNITY)
Admission: RE | Admit: 2020-07-20 | Discharge: 2020-07-20 | Disposition: A | Discharge status: Home / Self Care | Payer: Commercial Managed Care - PPO | Source: Ambulatory Visit | Attending: Acute Care | Admitting: Acute Care

## 2020-07-20 DIAGNOSIS — L89899 Pressure ulcer of other site, unspecified stage: Secondary | ICD-10-CM | POA: Diagnosis not present

## 2020-07-20 DIAGNOSIS — Z79899 Other long term (current) drug therapy: Secondary | ICD-10-CM | POA: Insufficient documentation

## 2020-07-20 DIAGNOSIS — C7989 Secondary malignant neoplasm of other specified sites: Secondary | ICD-10-CM | POA: Insufficient documentation

## 2020-07-20 DIAGNOSIS — C4442 Squamous cell carcinoma of skin of scalp and neck: Secondary | ICD-10-CM | POA: Insufficient documentation

## 2020-07-20 DIAGNOSIS — J9509 Other tracheostomy complication: Secondary | ICD-10-CM | POA: Insufficient documentation

## 2020-07-20 DIAGNOSIS — Z930 Tracheostomy status: Secondary | ICD-10-CM

## 2020-07-20 NOTE — Progress Notes (Signed)
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 Currently undergoing chemo. I follow him for trach management. Last seen 5/2. At that point he also has a trach site wound that I have been following (and treated w/ changing him to mepiplex dressing and biovona trach).   Review of Systems  Constitutional: Negative for chills and fever.  HENT: Negative for congestion and sinus pain.   Eyes: Negative.   Respiratory: Negative for cough, hemoptysis, sputum production and shortness of breath.   Cardiovascular: Positive for chest pain.  Gastrointestinal: Negative.   Genitourinary: Negative.   Musculoskeletal: Negative.   Neurological: Negative.   Endo/Heme/Allergies: Negative.   Psychiatric/Behavioral: Negative.    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  His phonation quality is much improved, he is tolerating Passy-Muir valve well.  The palpable neck mass is much smaller, in fact I am having difficulty even palpating it Pulmonary: Clear to auscultation currently room air Cardiac regular rate and rhythm Abdomen soft not tender no organomegaly Neuro intact  Procedure Current tracheostomy was removed, stoma evaluated, see picture above.  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  It is a fraction of  the size in comparison to when we first started the Mepilex dressing, and change his trach.  He is doing very well with chemotherapy he is status post his second round.  His phonation quality is much improved, swallowing excellent.  No trouble with cough.  Plan Will continue every 4 week trach change Encourage PMV as much as tolerated Continue routine trach care Continue Mepilex dressing every 4 to 5 days I have no immediate plans for decannulation, unless of course his cancer is deemed in remission at which time we could consider formal capping trials  Erick Colace ACNP-BC [place] Pager # [phone] OR # [phone] if no answer

## 2020-07-20 NOTE — Progress Notes (Signed)
Tracheostomy Procedure Note  Alexander [name] [id number] 07-06-1960  Pre Procedure Tracheostomy Information  Trach Brand: Bivona Portex  60A160 Size: 6.0 Style: Uncuffed Secured by: Velcro   Procedure: Trach Change Trach cleaning and Wound Care    Post Procedure Tracheostomy Information  Trach Brand: Bivona Portex  60A160 Size: 6.0 Style: Uncuffed Secured by: Velcro   Post Procedure Evaluation:  ETCO2 positive color change from yellow to purple : Yes.   Vital signs"  VSS Patients current condition: stable Complications: No apparent complications Trach site exam: clean and dry Wound care done: 4 x 4 gauze drain Patient did tolerate procedure well.   Education: Wearing of red caps  Prescription needs: Wound bandages     Additional needs: New PMV given to patient at this visit

## 2020-07-24 ENCOUNTER — Other Ambulatory Visit (HOSPITAL_COMMUNITY): Payer: Self-pay

## 2020-07-24 DIAGNOSIS — R1310 Dysphagia, unspecified: Secondary | ICD-10-CM

## 2020-07-24 NOTE — Telephone Encounter (Signed)
Completed by [facility]

## 2020-07-26 ENCOUNTER — Encounter: Payer: Self-pay | Admitting: Hematology and Oncology

## 2020-07-26 ENCOUNTER — Inpatient Hospital Stay: Payer: Commercial Managed Care - PPO | Attending: Hematology and Oncology | Admitting: Hematology and Oncology

## 2020-07-26 ENCOUNTER — Inpatient Hospital Stay: Payer: Commercial Managed Care - PPO

## 2020-07-26 ENCOUNTER — Other Ambulatory Visit: Payer: Self-pay

## 2020-07-26 ENCOUNTER — Other Ambulatory Visit: Payer: Commercial Managed Care - PPO

## 2020-07-26 DIAGNOSIS — Z95828 Presence of other vascular implants and grafts: Secondary | ICD-10-CM

## 2020-07-26 DIAGNOSIS — Z430 Encounter for attention to tracheostomy: Secondary | ICD-10-CM

## 2020-07-26 DIAGNOSIS — Z808 Family history of malignant neoplasm of other organs or systems: Secondary | ICD-10-CM | POA: Insufficient documentation

## 2020-07-26 DIAGNOSIS — C01 Malignant neoplasm of base of tongue: Secondary | ICD-10-CM | POA: Diagnosis present

## 2020-07-26 DIAGNOSIS — Z800 Family history of malignant neoplasm of digestive organs: Secondary | ICD-10-CM | POA: Diagnosis not present

## 2020-07-26 DIAGNOSIS — Z5111 Encounter for antineoplastic chemotherapy: Secondary | ICD-10-CM | POA: Diagnosis present

## 2020-07-26 DIAGNOSIS — Z5112 Encounter for antineoplastic immunotherapy: Secondary | ICD-10-CM | POA: Diagnosis present

## 2020-07-26 DIAGNOSIS — Z803 Family history of malignant neoplasm of breast: Secondary | ICD-10-CM | POA: Insufficient documentation

## 2020-07-26 LAB — TSH: TSH: 1.171 u[IU]/mL (ref 0.320–4.118)

## 2020-07-26 LAB — CMP (CANCER CENTER ONLY)
ALT: 12 U/L (ref 0–44)
AST: 13 U/L — ABNORMAL LOW (ref 15–41)
Albumin: 3.5 g/dL (ref 3.5–5.0)
Alkaline Phosphatase: 58 U/L (ref 38–126)
Anion gap: 7 (ref 5–15)
BUN: 10 mg/dL (ref 6–20)
CO2: 29 mmol/L (ref 22–32)
Calcium: 9.7 mg/dL (ref 8.9–10.3)
Chloride: 104 mmol/L (ref 98–111)
Creatinine: 1.07 mg/dL (ref 0.61–1.24)
GFR, Estimated: >60 mL/min (ref >60)
Glucose, Bld: 111 mg/dL — ABNORMAL HIGH (ref 70–99)
Potassium: 4 mmol/L (ref 3.5–5.1)
Sodium: 140 mmol/L (ref 135–145)
Total Bilirubin: 0.7 mg/dL (ref 0.3–1.2)
Total Protein: 6.9 g/dL (ref 6.5–8.1)

## 2020-07-26 LAB — CBC WITH DIFFERENTIAL/PLATELET
Abs Immature Granulocytes: 0.01 10*3/uL (ref 0.00–0.07)
Basophils Absolute: 0 10*3/uL (ref 0.0–0.1)
Basophils Relative: 1 %
Eosinophils Absolute: 0 10*3/uL (ref 0.0–0.5)
Eosinophils Relative: 1 %
HCT: 37.4 % — ABNORMAL LOW (ref 39.0–52.0)
Hemoglobin: 12.5 g/dL — ABNORMAL LOW (ref 13.0–17.0)
Immature Granulocytes: 0 %
Lymphocytes Relative: 27 %
Lymphs Abs: 1 10*3/uL (ref 0.7–4.0)
MCH: 30.6 pg (ref 26.0–34.0)
MCHC: 33.4 g/dL (ref 30.0–36.0)
MCV: 91.7 fL (ref 80.0–100.0)
Monocytes Absolute: 0.4 10*3/uL (ref 0.1–1.0)
Monocytes Relative: 11 %
Neutro Abs: 2.4 10*3/uL (ref 1.7–7.7)
Neutrophils Relative %: 60 %
Platelets: 131 10*3/uL — ABNORMAL LOW (ref 150–400)
RBC: 4.08 MIL/uL — ABNORMAL LOW (ref 4.22–5.81)
RDW: 17.3 % — ABNORMAL HIGH (ref 11.5–15.5)
WBC: 3.9 10*3/uL — ABNORMAL LOW (ref 4.0–10.5)
nRBC: 0 % (ref 0.0–0.2)

## 2020-07-26 MED ORDER — SODIUM CHLORIDE 0.9% FLUSH
10.0000 mL | INTRAVENOUS | Status: DC | PRN
Start: 2020-07-26 — End: 2020-07-26
  Administered 2020-07-26: 10 mL via INTRAVENOUS
  Filled 2020-07-26: qty 10

## 2020-07-26 MED ORDER — HEPARIN SOD (PORK) LOCK FLUSH 100 UNIT/ML IV SOLN
500.0000 [IU] | Freq: Once | INTRAVENOUS | Status: AC
  Administered 2020-07-26: 500 [IU] via INTRAVENOUS
  Filled 2020-07-26: qty 5

## 2020-07-26 NOTE — Progress Notes (Signed)
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  His PET/CT otherwise did not show any evidence of distant metastatic disease besides subcarinal lymphadenopathy.  Since he is of robust performance status and very motivated, we have discussed about frontline chemoimmunotherapy for 2 or 3 cycles and reevaluate for possible concurrent chemoradiation course depending on his response and his tolerance.  He understands that this is not the typical approach and distant metastatic disease but he is very motivated to try and do his best since he has never been sick prior to this. Started C1D1 on 4/26. He has tolerated first cycle of chemotherapy really well, no adverse effects.  We will plan to proceed with cycle 2 of chemotherapy with the same dose.  Plan is to repeat imaging after 3 cycles of chemotherapy and actually discuss him in the tumor board about consideration for concurrent chemoradiation for the neck and palliative radiation to the chest.    He clearly understands that this is not SOC approach but he was willing to try whatever we can do for him. So far remarkable response after 2 cycles. He is doing really  well, with no toxicity to report. PE, impressive response so far. Plan is to proceed with cycle 3 of chemo and then FU with PET CT, present in the ENT TB and proceed with recommendations based on TB Discussion. Labs from today reviewed and satisfactory to proceed.   Tracheostomy care Alexander [name]) Emergent trach needed because of malignancy. Tracheostomy capped today, patient is very comfortable, no respiratory distress. We will finalize the treatment plan based on TB discussion, may be safe to leave the trach in place if we are indeed going to proceed with radiation to the neck.  No orders of the defined types were placed in this encounter.    HISTORY OF PRESENTING ILLNESS:  Alexander [name] 60 y.o. 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[place])    05/18/2020 Cancer Staging   Staging form: Pharynx - HPV-Mediated Oropharynx, AJCC 8th Edition - Clinical stage from 05/18/2020: cT4, cN3, p16+ - Signed by Alexander Gibson, [name] on 05/22/2020    06/01/2020 Cancer Staging   Staging form: Pharynx - HPV-Mediated Oropharynx, AJCC 8th Edition - Pathologic stage from 06/01/2020: No Stage Recommended (cT4, cN3, cM1, p16+) - Signed by Alexander Pike, [name] on 06/01/2020  Stage prefix: Initial diagnosis    06/12/2020 -  Chemotherapy    Patient is on Treatment Plan: HEAD/NECK PEMBROLIZUMAB + CARBOPLATIN + 5FU Q21D X 6 CYCLES / PEMBROLIZUMAB Q21D        INTERIM HISTORY  Alexander [name] is here for a follow up by himself. He says everything went well with  the last chemo. No fatigue, nausea, vomiting, mouth sores, diarrhea. He felt well, eating well. He has his trach capped and able to breath well. No fevers or chills. He is happy with response so far, lymph nodes in his neck responding. Rest of the pertinent 10 point ROS reviewed and negative.  MEDICAL HISTORY:  Past Medical  History:  Diagnosis Date   Asthma    Cancer ([place])    GERD (gastroesophageal reflux disease)    Sleep apnea    CPAP- does not wear    SURGICAL HISTORY: Past Surgical History:  Procedure Laterality Date   BRONCHIAL NEEDLE ASPIRATION BIOPSY  05/22/2020   Procedure: BRONCHIAL NEEDLE ASPIRATION BIOPSIES;  Surgeon: Alexander Nash, DO;  Location: [facility];  Service: Pulmonary;;   DIRECT LARYNGOSCOPY  05/09/2020   Procedure: DIRECT LARYNGOSCOPY;  Surgeon: Alexander [name];  Location: [facility] OR;  Service: ENT;;   IR IMAGING GUIDED PORT INSERTION  06/12/2020   MASS EXCISION N/A 05/09/2020   Procedure: EXCISION TONGUE MASS;  Surgeon: Alexander [name];  Location: [facility] OR;  Service: ENT;  Laterality: N/A;   TONSILLECTOMY     TRACHEOSTOMY TUBE PLACEMENT  05/09/2020   Procedure: TRACHEOSTOMY;  Surgeon: Alexander [name];  Location: [facility];  Service: ENT;;   VIDEO BRONCHOSCOPY WITH ENDOBRONCHIAL ULTRASOUND N/A 05/22/2020   Procedure: VIDEO BRONCHOSCOPY WITH ENDOBRONCHIAL ULTRASOUND;  Surgeon: Alexander Nash, DO;  Location: [facility];  Service: Pulmonary;  Laterality: N/A;    SOCIAL HISTORY: Social History   Socioeconomic History   Marital status: Single    Spouse name: Not on file   Number of children: Not on file   Years of education: Not on file   Highest education level: Not on file  Occupational History   Not on file  Tobacco Use   Smoking status: Never   Smokeless tobacco: Never  Vaping Use   Vaping Use: Never used  Substance and Sexual Activity   Alcohol use: Not Currently    Comment: "4-5 drinks a year for special ocassions" 05/08/20   Drug use: Never   Sexual activity: Not Currently  Other Topics Concern   Not on file  Social History Narrative   Not on file   Social Determinants of Health   Financial Resource Strain: Low Risk    Difficulty of Paying Living Expenses: Not hard at all  Food Insecurity: No Food Insecurity   Worried About Ship broker in the Last Year: Never true   [place] in the Last Year: Never true  Transportation Needs: No Transportation Needs   Lack of Transportation (Medical): No   Lack of Transportation (Non-Medical): No  Physical Activity: Not on file  Stress: No Stress Concern Present   Feeling of Stress : Not at all  Social Connections: Socially Isolated   Frequency of Communication with Friends and Family: More than three times a week   Frequency of Social Gatherings with Friends and Family: More than three times a week   Attends Religious Services: Never   Marine scientist or Organizations: No   Attends Archivist Meetings: Never   Marital Status: Separated  Intimate Partner Violence: Not on file    FAMILY HISTORY: Family History  Problem Relation Age of Onset   Breast cancer Mother    Cancer - Cervical Mother    Colon cancer Father    Colon cancer Brother     ALLERGIES:  has No Known Allergies.  MEDICATIONS:  Current Outpatient Medications  Medication Sig Dispense Refill   albuterol (PROVENTIL) (2.5 MG/3ML) 0.083% nebulizer solution Take 3 mLs (2.5 mg total) by nebulization every 6 (six) hours as needed for wheezing or shortness of breath. 120 mL 12   albuterol (VENTOLIN HFA) 108 (90 Base) MCG/ACT inhaler Inhale 2 puffs into the lungs as needed for wheezing or shortness of breath.     bacitracin ointment Apply 1 application topically every 8 (eight) hours. Apply around trach wound 120 g 0   cetirizine (ZYRTEC) 10 MG tablet Take 10 mg by mouth daily.     dexamethasone (DECADRON) 4 MG tablet Take 2 tablets (8 mg total) by mouth daily. Start the day after carboplatin chemotherapy for 3 days. 30 tablet 1   fluticasone (FLONASE) 50 MCG/ACT nasal spray Place 2 sprays into both nostrils daily. Place 1 spray each nostril at night 16 g 11   lidocaine-prilocaine (EMLA) cream Apply to affected area once as directed 30 g 3   LORazepam (ATIVAN) 0.5 MG tablet Take 1 tablet (0.5  mg total) by mouth every 6 (six) hours as needed (Nausea or vomiting). 30 tablet 0   omeprazole (PRILOSEC) 40 MG capsule Take 40 mg by mouth daily.     ondansetron (ZOFRAN) 8 MG tablet Take 1 tablet (8 mg total) by mouth 2 (two) times daily as needed for refractory nausea / vomiting. Start on day 3 after carboplatin chemo. 30 tablet 1   prochlorperazine (COMPAZINE) 10 MG tablet Take 1 tablet (10 mg total) by mouth every 6 (six) hours as needed for nausea or vomiting. 30 tablet 1   Fluticasone-Salmeterol (ADVAIR) 100-50 MCG/DOSE AEPB Inhale 1 puff into the lungs daily.     No current facility-administered medications for this visit.    PHYSICAL EXAMINATION: ECOG PERFORMANCE STATUS: 0 - Asymptomatic  Vitals:   07/26/20 1340  BP: 105/61  Pulse: 87  Resp: 18  Temp: 97.9 F (36.6 C)  SpO2: 100%   Filed Weights   07/26/20 1340  Weight: 273 lb (123.8 kg)   Physical Exam Constitutional:      Appearance: Normal appearance. He is obese. He is not ill-appearing.  HENT:     Head: Normocephalic and atraumatic.     Mouth/Throat:     Mouth: Mucous membranes are moist.     Pharynx: Oropharynx is clear.  Cardiovascular:     Rate and Rhythm: Normal rate and regular rhythm.     Pulses: Normal pulses.     Heart sounds: Normal heart sounds.  Pulmonary:     Effort: Pulmonary effort is normal.     Breath sounds: Normal breath sounds.  Abdominal:     General: Abdomen is flat. Bowel sounds are normal.     Palpations: Abdomen is soft.  Musculoskeletal:        General: No swelling. Normal range of motion.     Cervical back: Normal range of motion.  Lymphadenopathy:     Cervical: Cervical adenopathy (Significantly improved. Trach capped) present.  Skin:    General: Skin is warm and dry.  Neurological:     General: No focal deficit present.     Mental Status: He is alert.  Psychiatric:        Mood and Affect: Mood normal.        Behavior: Behavior normal.   LABORATORY DATA:  I have  reviewed the data as listed Lab Results  Component Value Date   WBC 3.9 (L) 07/26/2020   HGB 12.5 (L)  07/26/2020   HCT 37.4 (L) 07/26/2020   MCV 91.7 07/26/2020   PLT 131 (L) 07/26/2020     Chemistry      Component Value Date/Time   NA 140 07/26/2020 1325   K 4.0 07/26/2020 1325   CL 104 07/26/2020 1325   CO2 29 07/26/2020 1325   BUN 10 07/26/2020 1325   CREATININE 1.07 07/26/2020 1325      Component Value Date/Time   CALCIUM 9.7 07/26/2020 1325   ALKPHOS 58 07/26/2020 1325   AST 13 (L) 07/26/2020 1325   ALT 12 07/26/2020 1325   BILITOT 0.7 07/26/2020 1325     Labs reviewed, satisfactory to proceed.  RADIOGRAPHIC STUDIES: I have personally reviewed the radiological images as listed and agreed with the findings in the report. No results found.   All questions were answered. The patient knows to call the clinic with any problems, questions or concerns.    Alexander Pike, [name] 07/26/2020 7:07 PM

## 2020-07-26 NOTE — Assessment & Plan Note (Signed)
Emergent trach needed because of malignancy. Tracheostomy capped today, patient is very comfortable, no respiratory distress. We will finalize the treatment plan based on TB discussion, may be safe to leave the trach in place if we are indeed going to proceed with radiation to the neck.

## 2020-07-26 NOTE — Assessment & Plan Note (Signed)
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  His PET/CT otherwise did not show any evidence of distant metastatic disease besides subcarinal lymphadenopathy.  Since he is of robust performance status and very motivated, we have discussed about frontline chemoimmunotherapy for 2 or 3 cycles and reevaluate for possible concurrent chemoradiation course depending on his response and his tolerance.  He understands that this is not the typical approach and distant metastatic disease but he is very motivated to try and do his best since he has never been sick prior to this. Started C1D1 on 4/26. He has tolerated first cycle of chemotherapy really well, no adverse effects.  We will plan to proceed with cycle 2 of chemotherapy with the same dose.  Plan is to repeat imaging after 3 cycles of chemotherapy and actually discuss him in the tumor board about consideration for concurrent chemoradiation for the neck and palliative radiation to the chest.    He clearly understands that this is not SOC approach but he was willing to try whatever we can do for him. So far remarkable response after 2 cycles. He is doing really well, with no toxicity to report. PE, impressive response so far. Plan is to proceed with cycle 3 of chemo and then FU with PET CT, present in the ENT TB and proceed with recommendations based on TB Discussion. Labs from today reviewed and satisfactory to proceed.

## 2020-07-27 ENCOUNTER — Inpatient Hospital Stay: Payer: Commercial Managed Care - PPO

## 2020-07-27 VITALS — BP 117/87 | HR 78 | Temp 98.2°F | Resp 16

## 2020-07-27 DIAGNOSIS — C01 Malignant neoplasm of base of tongue: Secondary | ICD-10-CM

## 2020-07-27 DIAGNOSIS — Z5112 Encounter for antineoplastic immunotherapy: Secondary | ICD-10-CM | POA: Diagnosis not present

## 2020-07-27 LAB — T4: T4, Total: 6.7 ug/dL (ref 4.5–12.0)

## 2020-07-27 MED ORDER — SODIUM CHLORIDE 0.9 % IV SOLN
10000.0000 mg | INTRAVENOUS | Status: DC
  Administered 2020-07-27: 10000 mg via INTRAVENOUS
  Filled 2020-07-27: qty 200

## 2020-07-27 MED ORDER — PALONOSETRON HCL INJECTION 0.25 MG/5ML
0.2500 mg | Freq: Once | INTRAVENOUS | Status: AC
  Administered 2020-07-27: 0.25 mg via INTRAVENOUS

## 2020-07-27 MED ORDER — SODIUM CHLORIDE 0.9 % IV SOLN
Freq: Once | INTRAVENOUS | Status: AC
  Filled 2020-07-27: qty 250

## 2020-07-27 MED ORDER — SODIUM CHLORIDE 0.9 % IV SOLN
10.0000 mg | Freq: Once | INTRAVENOUS | Status: AC
  Administered 2020-07-27: 10 mg via INTRAVENOUS
  Filled 2020-07-27: qty 10

## 2020-07-27 MED ORDER — PALONOSETRON HCL INJECTION 0.25 MG/5ML
INTRAVENOUS | Status: AC
  Filled 2020-07-27: qty 5

## 2020-07-27 MED ORDER — SODIUM CHLORIDE 0.9 % IV SOLN
150.0000 mg | Freq: Once | INTRAVENOUS | Status: AC
  Administered 2020-07-27: 150 mg via INTRAVENOUS
  Filled 2020-07-27: qty 150

## 2020-07-27 MED ORDER — SODIUM CHLORIDE 0.9 % IV SOLN
750.0000 mg | Freq: Once | INTRAVENOUS | Status: AC
  Administered 2020-07-27: 750 mg via INTRAVENOUS
  Filled 2020-07-27: qty 75

## 2020-07-27 MED ORDER — SODIUM CHLORIDE 0.9 % IV SOLN
200.0000 mg | Freq: Once | INTRAVENOUS | Status: AC
  Administered 2020-07-27: 200 mg via INTRAVENOUS
  Filled 2020-07-27: qty 8

## 2020-07-27 NOTE — Patient Instructions (Signed)
[facility]  Discharge Instructions: Thank you for choosing [facility] to provide your oncology and hematology care.   If you have a lab appointment with the [facility], please go directly to the [place] and check in at the registration area.   Wear comfortable clothing and clothing appropriate for easy access to any Portacath or PICC line.   We strive to give you quality time with your provider. You may need to reschedule your appointment if you arrive late (15 or more minutes).  Arriving late affects you and other patients whose appointments are after yours.  Also, if you miss three or more appointments without notifying the office, you may be dismissed from the clinic at the provider's discretion.      For prescription refill requests, have your pharmacy contact our office and allow 72 hours for refills to be completed.    Today you received the following chemotherapy and/or immunotherapy agents: Keytruda, Carboplatin, 5FU     To help prevent nausea and vomiting after your treatment, we encourage you to take your nausea medication as directed.  BELOW ARE SYMPTOMS THAT SHOULD BE REPORTED IMMEDIATELY: *FEVER GREATER THAN 100.4 F (38 C) OR HIGHER *CHILLS OR SWEATING *NAUSEA AND VOMITING THAT IS NOT CONTROLLED WITH YOUR NAUSEA MEDICATION *UNUSUAL SHORTNESS OF BREATH *UNUSUAL BRUISING OR BLEEDING *URINARY PROBLEMS (pain or burning when urinating, or frequent urination) *BOWEL PROBLEMS (unusual diarrhea, constipation, pain near the anus) TENDERNESS IN MOUTH AND THROAT WITH OR WITHOUT PRESENCE OF ULCERS (sore throat, sores in mouth, or a toothache) UNUSUAL RASH, SWELLING OR PAIN  UNUSUAL VAGINAL DISCHARGE OR ITCHING   Items with * indicate a potential emergency and should be followed up as soon as possible or go to the Emergency Department if any problems should occur.  Please show the CHEMOTHERAPY ALERT CARD or IMMUNOTHERAPY ALERT CARD  at check-in to the Emergency Department and triage nurse.  Should you have questions after your visit or need to cancel or reschedule your appointment, please contact [facility]  Dept: [phone]  and follow the prompts.  Office hours are 8:00 a.m. to 4:30 p.m. Monday - Friday. Please note that voicemails left after 4:00 p.m. may not be returned until the following business day.  We are closed weekends and major holidays. You have access to a nurse at all times for urgent questions. Please call the main number to the clinic Dept: [phone] and follow the prompts.   For any non-urgent questions, you may also contact your provider using MyChart. We now offer e-Visits for anyone 66 and older to request care online for non-urgent symptoms. For details visit [email or web address].   Also download the MyChart app! Go to the app store, search "MyChart", open the app, select [facility], and log in with your MyChart username and password.  Due to Covid, a mask is required upon entering the hospital/clinic. If you do not have a mask, one will be given to you upon arrival. For doctor visits, patients may have 1 support person aged 74 or older with them. For treatment visits, patients cannot have anyone with them due to current Covid guidelines and our immunocompromised population.   Pembrolizumab injection What is this medicine? PEMBROLIZUMAB (pem broe liz ue mab) is a monoclonal antibody. It is used to treat certain types of cancer. This medicine may be used for other purposes; ask your health care provider or pharmacist if you have questions. COMMON BRAND NAME(S): [employer]  What should I tell my health care provider before I take this medicine? They need to know if you have any of these conditions: autoimmune diseases like Crohn's disease, ulcerative colitis, or lupus have had or planning to have an allogeneic stem cell transplant (uses someone else's stem  cells) history of organ transplant history of chest radiation nervous system problems like myasthenia gravis or Guillain-Barre syndrome an unusual or allergic reaction to pembrolizumab, other medicines, foods, dyes, or preservatives pregnant or trying to get pregnant breast-feeding How should I use this medicine? This medicine is for infusion into a vein. It is given by a health care professional in a hospital or clinic setting. A special MedGuide will be given to you before each treatment. Be sure to read this information carefully each time. Talk to your pediatrician regarding the use of this medicine in children. While this drug may be prescribed for children as young as 6 months for selected conditions, precautions do apply. Overdosage: If you think you have taken too much of this medicine contact a poison control center or emergency room at once. NOTE: This medicine is only for you. Do not share this medicine with others. What if I miss a dose? It is important not to miss your dose. Call your doctor or health care professional if you are unable to keep an appointment. What may interact with this medicine? Interactions have not been studied. This list may not describe all possible interactions. Give your health care provider a list of all the medicines, herbs, non-prescription drugs, or dietary supplements you use. Also tell them if you smoke, drink alcohol, or use illegal drugs. Some items may interact with your medicine. What should I watch for while using this medicine? Your condition will be monitored carefully while you are receiving this medicine. You may need blood work done while you are taking this medicine. Do not become pregnant while taking this medicine or for 4 months after stopping it. Women should inform their doctor if they wish to become pregnant or think they might be pregnant. There is a potential for serious side effects to an unborn child. Talk to your health care  professional or pharmacist for more information. Do not breast-feed an infant while taking this medicine or for 4 months after the last dose. What side effects may I notice from receiving this medicine? Side effects that you should report to your doctor or health care professional as soon as possible: allergic reactions like skin rash, itching or hives, swelling of the face, lips, or tongue bloody or black, tarry breathing problems changes in vision chest pain chills confusion constipation cough diarrhea dizziness or feeling faint or lightheaded fast or irregular heartbeat fever flushing joint pain low blood counts - this medicine may decrease the number of white blood cells, red blood cells and platelets. You may be at increased risk for infections and bleeding. muscle pain muscle weakness pain, tingling, numbness in the hands or feet persistent headache redness, blistering, peeling or loosening of the skin, including inside the mouth signs and symptoms of high blood sugar such as dizziness; dry mouth; dry skin; fruity breath; nausea; stomach pain; increased hunger or thirst; increased urination signs and symptoms of kidney injury like trouble passing urine or change in the amount of urine signs and symptoms of liver injury like dark urine, light-colored stools, loss of appetite, nausea, right upper belly pain, yellowing of the eyes or skin sweating swollen lymph nodes weight loss Side effects that usually do not  require medical attention (report to your doctor or health care professional if they continue or are bothersome): decreased appetite hair loss tiredness This list may not describe all possible side effects. Call your doctor for medical advice about side effects. You may report side effects to FDA at 1-800-FDA-1088. Where should I keep my medicine? This drug is given in a hospital or clinic and will not be stored at home. NOTE: This sheet is a summary. It may not cover  all possible information. If you have questions about this medicine, talk to your doctor, pharmacist, or health care provider.  2021 Elsevier/Gold Standard (2019-01-05 21:44:53)  Carboplatin injection What is this medicine? CARBOPLATIN (KAR boe pla tin) is a chemotherapy drug. It targets fast dividing cells, like cancer cells, and causes these cells to die. This medicine is used to treat ovarian cancer and many other cancers. This medicine may be used for other purposes; ask your health care provider or pharmacist if you have questions. COMMON BRAND NAME(S): Paraplatin What should I tell my health care provider before I take this medicine? They need to know if you have any of these conditions: blood disorders hearing problems kidney disease recent or ongoing radiation therapy an unusual or allergic reaction to carboplatin, cisplatin, other chemotherapy, other medicines, foods, dyes, or preservatives pregnant or trying to get pregnant breast-feeding How should I use this medicine? This drug is usually given as an infusion into a vein. It is administered in a hospital or clinic by a specially trained health care professional. Talk to your pediatrician regarding the use of this medicine in children. Special care may be needed. Overdosage: If you think you have taken too much of this medicine contact a poison control center or emergency room at once. NOTE: This medicine is only for you. Do not share this medicine with others. What if I miss a dose? It is important not to miss a dose. Call your doctor or health care professional if you are unable to keep an appointment. What may interact with this medicine? medicines for seizures medicines to increase blood counts like filgrastim, pegfilgrastim, sargramostim some antibiotics like amikacin, gentamicin, neomycin, streptomycin, tobramycin vaccines Talk to your doctor or health care professional before taking any of these  medicines: acetaminophen aspirin ibuprofen ketoprofen naproxen This list may not describe all possible interactions. Give your health care provider a list of all the medicines, herbs, non-prescription drugs, or dietary supplements you use. Also tell them if you smoke, drink alcohol, or use illegal drugs. Some items may interact with your medicine. What should I watch for while using this medicine? Your condition will be monitored carefully while you are receiving this medicine. You will need important blood work done while you are taking this medicine. This drug may make you feel generally unwell. This is not uncommon, as chemotherapy can affect healthy cells as well as cancer cells. Report any side effects. Continue your course of treatment even though you feel ill unless your doctor tells you to stop. In some cases, you may be given additional medicines to help with side effects. Follow all directions for their use. Call your doctor or health care professional for advice if you get a fever, chills or sore throat, or other symptoms of a cold or flu. Do not treat yourself. This drug decreases your body's ability to fight infections. Try to avoid being around people who are sick. This medicine may increase your risk to bruise or bleed. Call your doctor or health care professional  if you notice any unusual bleeding. Be careful brushing and flossing your teeth or using a toothpick because you may get an infection or bleed more easily. If you have any dental work done, tell your dentist you are receiving this medicine. Avoid taking products that contain aspirin, acetaminophen, ibuprofen, naproxen, or ketoprofen unless instructed by your doctor. These medicines may hide a fever. Do not become pregnant while taking this medicine. Women should inform their doctor if they wish to become pregnant or think they might be pregnant. There is a potential for serious side effects to an unborn child. Talk to your  health care professional or pharmacist for more information. Do not breast-feed an infant while taking this medicine. What side effects may I notice from receiving this medicine? Side effects that you should report to your doctor or health care professional as soon as possible: allergic reactions like skin rash, itching or hives, swelling of the face, lips, or tongue signs of infection - fever or chills, cough, sore throat, pain or difficulty passing urine signs of decreased platelets or bleeding - bruising, pinpoint red spots on the skin, black, tarry stools, nosebleeds signs of decreased red blood cells - unusually weak or tired, fainting spells, lightheadedness breathing problems changes in hearing changes in vision chest pain high blood pressure low blood counts - This drug may decrease the number of white blood cells, red blood cells and platelets. You may be at increased risk for infections and bleeding. nausea and vomiting pain, swelling, redness or irritation at the injection site pain, tingling, numbness in the hands or feet problems with balance, talking, walking trouble passing urine or change in the amount of urine Side effects that usually do not require medical attention (report to your doctor or health care professional if they continue or are bothersome): hair loss loss of appetite metallic taste in the mouth or changes in taste This list may not describe all possible side effects. Call your doctor for medical advice about side effects. You may report side effects to FDA at 1-800-FDA-1088. Where should I keep my medicine? This drug is given in a hospital or clinic and will not be stored at home. NOTE: This sheet is a summary. It may not cover all possible information. If you have questions about this medicine, talk to your doctor, pharmacist, or health care provider.  2021 Elsevier/Gold Standard (2007-05-11 14:38:05)  Fluorouracil, 5-FU injection What is this  medicine? FLUOROURACIL, 5-FU (flure oh YOOR a sil) is a chemotherapy drug. It slows the growth of cancer cells. This medicine is used to treat many types of cancer like breast cancer, colon or rectal cancer, pancreatic cancer, and stomach cancer. This medicine may be used for other purposes; ask your health care provider or pharmacist if you have questions. COMMON BRAND NAME(S): [employer] What should I tell my health care provider before I take this medicine? They need to know if you have any of these conditions: blood disorders dihydropyrimidine dehydrogenase (DPD) deficiency infection (especially a virus infection such as chickenpox, cold sores, or herpes) kidney disease liver disease malnourished, poor nutrition recent or ongoing radiation therapy an unusual or allergic reaction to fluorouracil, other chemotherapy, other medicines, foods, dyes, or preservatives pregnant or trying to get pregnant breast-feeding How should I use this medicine? This drug is given as an infusion or injection into a vein. It is administered in a hospital or clinic by a specially trained health care professional. Talk to your pediatrician regarding the use of this medicine  in children. Special care may be needed. Overdosage: If you think you have taken too much of this medicine contact a poison control center or emergency room at once. NOTE: This medicine is only for you. Do not share this medicine with others. What if I miss a dose? It is important not to miss your dose. Call your doctor or health care professional if you are unable to keep an appointment. What may interact with this medicine? Do not take this medicine with any of the following medications: live virus vaccines This medicine may also interact with the following medications: medicines that treat or prevent blood clots like warfarin, enoxaparin, and dalteparin This list may not describe all possible interactions. Give your health care provider a  list of all the medicines, herbs, non-prescription drugs, or dietary supplements you use. Also tell them if you smoke, drink alcohol, or use illegal drugs. Some items may interact with your medicine. What should I watch for while using this medicine? Visit your doctor for checks on your progress. This drug may make you feel generally unwell. This is not uncommon, as chemotherapy can affect healthy cells as well as cancer cells. Report any side effects. Continue your course of treatment even though you feel ill unless your doctor tells you to stop. In some cases, you may be given additional medicines to help with side effects. Follow all directions for their use. Call your doctor or health care professional for advice if you get a fever, chills or sore throat, or other symptoms of a cold or flu. Do not treat yourself. This drug decreases your body's ability to fight infections. Try to avoid being around people who are sick. This medicine may increase your risk to bruise or bleed. Call your doctor or health care professional if you notice any unusual bleeding. Be careful brushing and flossing your teeth or using a toothpick because you may get an infection or bleed more easily. If you have any dental work done, tell your dentist you are receiving this medicine. Avoid taking products that contain aspirin, acetaminophen, ibuprofen, naproxen, or ketoprofen unless instructed by your doctor. These medicines may hide a fever. Do not become pregnant while taking this medicine. Women should inform their doctor if they wish to become pregnant or think they might be pregnant. There is a potential for serious side effects to an unborn child. Talk to your health care professional or pharmacist for more information. Do not breast-feed an infant while taking this medicine. Men should inform their doctor if they wish to father a child. This medicine may lower sperm counts. Do not treat diarrhea with over the counter  products. Contact your doctor if you have diarrhea that lasts more than 2 days or if it is severe and watery. This medicine can make you more sensitive to the sun. Keep out of the sun. If you cannot avoid being in the sun, wear protective clothing and use sunscreen. Do not use sun lamps or tanning beds/booths. What side effects may I notice from receiving this medicine? Side effects that you should report to your doctor or health care professional as soon as possible: allergic reactions like skin rash, itching or hives, swelling of the face, lips, or tongue low blood counts - this medicine may decrease the number of white blood cells, red blood cells and platelets. You may be at increased risk for infections and bleeding. signs of infection - fever or chills, cough, sore throat, pain or difficulty passing urine signs of  decreased platelets or bleeding - bruising, pinpoint red spots on the skin, black, tarry stools, blood in the urine signs of decreased red blood cells - unusually weak or tired, fainting spells, lightheadedness breathing problems changes in vision chest pain mouth sores nausea and vomiting pain, swelling, redness at site where injected pain, tingling, numbness in the hands or feet redness, swelling, or sores on hands or feet stomach pain unusual bleeding Side effects that usually do not require medical attention (report to your doctor or health care professional if they continue or are bothersome): changes in finger or toe nails diarrhea dry or itchy skin hair loss headache loss of appetite sensitivity of eyes to the light stomach upset unusually teary eyes This list may not describe all possible side effects. Call your doctor for medical advice about side effects. You may report side effects to FDA at 1-800-FDA-1088. Where should I keep my medicine? This drug is given in a hospital or clinic and will not be stored at home. NOTE: This sheet is a summary. It may not  cover all possible information. If you have questions about this medicine, talk to your doctor, pharmacist, or health care provider.  2021 Elsevier/Gold Standard (2019-01-04 15:00:03)

## 2020-07-31 ENCOUNTER — Other Ambulatory Visit: Payer: Self-pay

## 2020-07-31 ENCOUNTER — Inpatient Hospital Stay: Payer: Commercial Managed Care - PPO

## 2020-07-31 VITALS — BP 106/72 | HR 79 | Resp 18

## 2020-07-31 DIAGNOSIS — Z5112 Encounter for antineoplastic immunotherapy: Secondary | ICD-10-CM | POA: Diagnosis not present

## 2020-07-31 DIAGNOSIS — C01 Malignant neoplasm of base of tongue: Secondary | ICD-10-CM

## 2020-07-31 MED ORDER — SODIUM CHLORIDE 0.9% FLUSH
10.0000 mL | INTRAVENOUS | Status: DC | PRN
  Administered 2020-07-31: 10 mL
  Filled 2020-07-31: qty 10

## 2020-07-31 MED ORDER — HEPARIN SOD (PORK) LOCK FLUSH 100 UNIT/ML IV SOLN
500.0000 [IU] | Freq: Once | INTRAVENOUS | Status: AC | PRN
  Administered 2020-07-31: 500 [IU]
  Filled 2020-07-31: qty 5

## 2020-08-01 NOTE — Progress Notes (Signed)
Oncology Nurse Navigator Documentation   Dr. Chryl Heck and Dr. Isidore Moos discussed Alexander [name] at the completion of ENT conference today and asked that I get Alexander [name] scheduled for a follow up new appointment with Dr. Isidore Moos next week after his PET scan. I have sent a message to scheduling to schedule the appointment for 08/10/20 and notified Alexander [name] to expect a phone call to see Dr. Isidore Moos on 6/24. He voiced his understanding of the upcoming appointment and knows to call me if he has any questions or concerns.  [name] RN, BSN, OCN Head & Neck Oncology Nurse [place] [facility] Phone # [phone]  Fax # [phone]

## 2020-08-02 ENCOUNTER — Encounter: Payer: Self-pay | Admitting: Hematology and Oncology

## 2020-08-06 ENCOUNTER — Other Ambulatory Visit: Payer: Self-pay

## 2020-08-06 ENCOUNTER — Ambulatory Visit (HOSPITAL_COMMUNITY)
Admission: RE | Admit: 2020-08-06 | Discharge: 2020-08-06 | Disposition: A | Discharge status: Home / Self Care | Payer: Commercial Managed Care - PPO | Source: Ambulatory Visit | Attending: Hematology and Oncology | Admitting: Hematology and Oncology

## 2020-08-06 DIAGNOSIS — C01 Malignant neoplasm of base of tongue: Secondary | ICD-10-CM | POA: Diagnosis present

## 2020-08-06 LAB — GLUCOSE, CAPILLARY: Glucose-Capillary: 95 mg/dL (ref 70–99)

## 2020-08-06 MED ORDER — FLUDEOXYGLUCOSE F - 18 (FDG) INJECTION
14.1000 | Freq: Once | INTRAVENOUS | Status: AC | PRN
  Administered 2020-08-06: 13.41 via INTRAVENOUS

## 2020-08-09 ENCOUNTER — Ambulatory Visit (HOSPITAL_COMMUNITY)
Admission: RE | Admit: 2020-08-09 | Discharge: 2020-08-09 | Disposition: A | Discharge status: Home / Self Care | Payer: Commercial Managed Care - PPO | Source: Ambulatory Visit | Attending: Radiation Oncology | Admitting: Radiation Oncology

## 2020-08-09 ENCOUNTER — Other Ambulatory Visit: Payer: Self-pay

## 2020-08-09 DIAGNOSIS — C109 Malignant neoplasm of oropharynx, unspecified: Secondary | ICD-10-CM | POA: Diagnosis present

## 2020-08-09 DIAGNOSIS — R1310 Dysphagia, unspecified: Secondary | ICD-10-CM | POA: Diagnosis present

## 2020-08-09 NOTE — Progress Notes (Addendum)
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 male who initially presented to to the ED on 04/30/2020 for evaluation of a several month history of left-sided neck swelling .  Subsequently, the patient saw Dr. [name] on 05/03/20 who performed a fiberoptic laryngoscopy through the right nostril. Nasopharyngeal was clear, but the base of the tongue was full on the left side and epiglottis was slightly pushed back posteriorly with questionable ulcer on the left base of the tongue extending down tot he left vallecular area..  Biopsy of the base of the tongue on 05/09/20 revealed: invasive squamous cell carcinoma. Strongly p16 positive; emergent tracheostomy was also performed at that time.  Pertinent imaging thus far includes Soft tissue neck CT scan performed on 04/30/2020 that showed a large soft tissue mass centered in the region of the left tongue base that also extended posteriorly to the glossotonsillar sulcus and vallecula. That portion of the mass had poorly  defined margins but appeared to cross the midline and was estimated to measure at least 5.0 x 4.7 x 3.4 cm. Tumor extension was suspected inferiorly at least to the level of the supraglottic larynx. There was also apparent tumor extension on the right into the strap muscles at the level of the supraglottic larynx. Also noted was extensive bilateral cervical lymphadenopathy compatible with nodal metastatic disease, as well as concern for extracapsular extension.  Chest CT scan on 05/11/2020 that showed subcarinal mass/adenopathy and low lung volumes with bibasilar atelectasis. However, there was no acute intrathoracic pathology.  Patient underwent video bronchoscopy with endobrachial ultrasound on 05/22/20 under Dr. Valeta Harms. Cytology from the procedure biopsy of station 7 lymph node revealed: malignant cells consistent with squamous cell carcinoma. Molecular pathology revealed PD-L1 tumor proportion score to be 5%, positive for PD-L1 expression.  PET scan (skull base to thigh) taken on 05/24/20 revealed the large mass in the oropharynx extending into the supraglottic larynx associated with bilateral cervical adenopathy. Subcarinal nodal mass with intense FDG uptake compatible with nodal metastasis was also noted. Also seen were questionable areas of variable density in the liver, associated with potential increased metabolic activity. ( Also noted were small pulmonary nodules).  The patient presented to the ED on 05/27/20 with complaints of fatigue, decreased appetite, and fever of 102.9 F. The patient mentioned during the visit that he had been coughing up yellowish sputum for several days since he last had his trach changed. The patient was determined to be septic and was admitted for several days to receive IV antibiotic therapy. Neck CT  w/ contrast taken on 05/27/20 during ED visit showed the left tongue base mass to be unchanged in appearance; bilateral cervical lymphadenopathy was however noted. Chest  and orthopantogram x-rays taken during hospital stay showed no active cardiopulmonary disease, and no acute or destructive oral bony lesions (respectively). Chest x-ray taken on 05/29/20 did reveal larger lung volumes with mild bibasilar streaky opacity which was noted to resemble atelectasis. No pleural effusion was noted.   Patient began chemotherapy treatment on 06/12/20 of pembrolizumab +carboplatin+ 5FU Q21D x 6 cycles / pembrolizumab Q21D. He is noted to have been tolerating chemo treatments very well.   PET scan (skull base to thigh) taken on 08/06/20 showed a mild decrease in size of the base tongue mass with persistent intense FDG uptake. Also noted was persistent FDG avid adenopathy within bilateral cervical lymph node chains; some nodes were noted to have decreased in size and some were noted to have mildly increased in size.   I personally reviewed his pre and post treatment images with patient and his family.  Overall, disease is stable to slightly improved in my assessment.  Swallowing issues, if any: of note: new tracheostomy device was placed on 07/20/20; noted to have pressure ulcer inferior to trach site. No swallowing issues noted during visit on 07/26/20 with Dr. Chryl Heck. [name] has been replaced every 4 weeks.  Weight Changes: unintentional weight loss; lost around 30 pounds. He met with Dr. Chryl Heck on 06/01/20 to discuss this.  Pain status: none  Other symptoms: unknown  Tobacco history, if any: none  ETOH abuse, if any: none, drinks very rarely  Prior cancers, if any: none  PREVIOUS RADIATION THERAPY: No  PAST MEDICAL HISTORY:  has a past medical history of Asthma, Cancer ([place]), GERD (gastroesophageal reflux disease), and Sleep apnea.    PAST SURGICAL HISTORY: Past Surgical History:  Procedure Laterality Date   BRONCHIAL NEEDLE ASPIRATION BIOPSY  05/22/2020   Procedure: BRONCHIAL NEEDLE ASPIRATION BIOPSIES;  Surgeon: Garner Nash, DO;  Location: [facility];   Service: Pulmonary;;   DIRECT LARYNGOSCOPY  05/09/2020   Procedure: DIRECT LARYNGOSCOPY;  Surgeon: Rozetta Nunnery, MD;  Location: [facility] OR;  Service: ENT;;   IR IMAGING GUIDED PORT INSERTION  06/12/2020   MASS EXCISION N/A 05/09/2020   Procedure: EXCISION TONGUE MASS;  Surgeon: Rozetta Nunnery, MD;  Location: [facility] OR;  Service: ENT;  Laterality: N/A;   TONSILLECTOMY     TRACHEOSTOMY TUBE PLACEMENT  05/09/2020   Procedure: TRACHEOSTOMY;  Surgeon: Rozetta Nunnery, MD;  Location: [place];  Service: ENT;;   VIDEO BRONCHOSCOPY WITH ENDOBRONCHIAL ULTRASOUND N/A 05/22/2020   Procedure: VIDEO BRONCHOSCOPY WITH ENDOBRONCHIAL ULTRASOUND;  Surgeon: Garner Nash, DO;  Location: [place];  Service: Pulmonary;  Laterality: N/A;    FAMILY HISTORY: family history includes Breast cancer in his mother; Cancer - Cervical in his mother; Colon cancer in his brother and father.  SOCIAL HISTORY:  reports that he has never smoked. He has never used smokeless tobacco. He reports previous alcohol use. He reports that he does not use drugs.  ALLERGIES: Patient has no known allergies.  MEDICATIONS:  Current Outpatient Medications  Medication Sig Dispense Refill   albuterol (PROVENTIL) (2.5 MG/3ML) 0.083% nebulizer solution Take 3 mLs (2.5 mg total) by nebulization every 6 (six) hours as needed for wheezing or shortness of breath. 120 mL 12   albuterol (VENTOLIN HFA) 108 (90 Base) MCG/ACT inhaler Inhale 2 puffs into the lungs as needed for wheezing or shortness of breath.  bacitracin ointment Apply 1 application topically every 8 (eight) hours. Apply around trach wound 120 g 0   cetirizine (ZYRTEC) 10 MG tablet Take 10 mg by mouth daily.     dexamethasone (DECADRON) 4 MG tablet Take 2 tablets (8 mg total) by mouth daily. Start the day after carboplatin chemotherapy for 3 days. 30 tablet 1   fluticasone (FLONASE) 50 MCG/ACT nasal spray Place 2 sprays into both nostrils daily. Place 1 spray each nostril  at night 16 g 11   Fluticasone-Salmeterol (ADVAIR) 100-50 MCG/DOSE AEPB Inhale 1 puff into the lungs daily.     lidocaine-prilocaine (EMLA) cream Apply to affected area once as directed 30 g 3   LORazepam (ATIVAN) 0.5 MG tablet Take 1 tablet (0.5 mg total) by mouth every 6 (six) hours as needed (Nausea or vomiting). 30 tablet 0   omeprazole (PRILOSEC) 40 MG capsule Take 40 mg by mouth daily.     ondansetron (ZOFRAN) 8 MG tablet Take 1 tablet (8 mg total) by mouth 2 (two) times daily as needed for refractory nausea / vomiting. Start on day 3 after carboplatin chemo. 30 tablet 1   prochlorperazine (COMPAZINE) 10 MG tablet Take 1 tablet (10 mg total) by mouth every 6 (six) hours as needed for nausea or vomiting. 30 tablet 1   No current facility-administered medications for this encounter.    REVIEW OF SYSTEMS:  Notable for that above.   PHYSICAL EXAM:  height is _0  (1.905 m) and weight is 275 lb (124.7 kg). His temporal temperature is 97.4 F (36.3 C) (abnormal). His blood pressure is 100/67 and his pulse is 89. His respiration is 18 and oxygen saturation is 100%.   General: Alert and oriented, in no acute distress, no stridor HEENT: Head is normocephalic. Extraocular movements are intact. Oropharynx is notable for no obvious lesions to palpation, though gag reflex affected exam; no thrush Neck: Neck is notable for Bilateral fullness in neck, palpable hard mass in left level 2 ; trach intact Psychiatric: Judgment and insight are intact. Affect is appropriate. Skin: hyperpigmented spots on palms  ECOG = 1  0 - Asymptomatic (Fully active, able to carry on all predisease activities without restriction)  1 - Symptomatic but completely ambulatory (Restricted in physically strenuous activity but ambulatory and able to carry out work of a light or sedentary nature. For example, light housework, office work)  2 - Symptomatic, <50% in bed during the day (Ambulatory and capable of all self care but  unable to carry out any work activities. Up and about more than 50% of waking hours)  3 - Symptomatic, >50% in bed, but not bedbound (Capable of only limited self-care, confined to bed or chair 50% or more of waking hours)  4 - Bedbound (Completely disabled. Cannot carry on any self-care. Totally confined to bed or chair)  5 - Death   Eustace Pen MM, Creech RH, Tormey DC, et al. [phone]). "Toxicity and response criteria of the [facility] Group". [place] Oncol. 5 (6): 649-55   LABORATORY DATA:  Lab Results  Component Value Date   WBC 3.9 (L) 07/26/2020   HGB 12.5 (L) 07/26/2020   HCT 37.4 (L) 07/26/2020   MCV 91.7 07/26/2020   PLT 131 (L) 07/26/2020   CMP     Component Value Date/Time   NA 140 07/26/2020 1325   K 4.0 07/26/2020 1325   CL 104 07/26/2020 1325   CO2 29 07/26/2020 1325   GLUCOSE 111 (H) 07/26/2020 1325  BUN 10 07/26/2020 1325   CREATININE 1.07 07/26/2020 1325   CALCIUM 9.7 07/26/2020 1325   PROT 6.9 07/26/2020 1325   ALBUMIN 3.5 07/26/2020 1325   AST 13 (L) 07/26/2020 1325   ALT 12 07/26/2020 1325   ALKPHOS 58 07/26/2020 1325   BILITOT 0.7 07/26/2020 1325   GFRNONAA >60 07/26/2020 1325      Lab Results  Component Value Date   TSH 1.171 07/26/2020     RADIOGRAPHY: NM PET Image Restag (PS) Skull Base To Thigh  Result Date: 08/06/2020 CLINICAL DATA:  Subsequent treatment strategy for head and neck cancer. EXAM: NUCLEAR MEDICINE PET SKULL BASE TO THIGH TECHNIQUE: 13.4 mCi F-18 FDG was injected intravenously. Full-ring PET imaging was performed from the skull base to thigh after the radiotracer. CT data was obtained and used for attenuation correction and anatomic localization. Fasting blood glucose: 95 mg/dl COMPARISON:  05/24/2020 FINDINGS: Mediastinal blood pool activity: SUV max 3.6 Liver activity: SUV max NA NECK: Base of tongue mass has an SUV max of 14.07 and measures approximately 3.6 x 2.5 cm. Previously the SUV max was equal to 17.5 and  the mass measured approximately 5.9 x 5.4 cm. Bilateral FDG avid cervical lymph nodes are identified. Index left level 2 node measures 3.4 x 2.7 cm with SUV max of 8.52, image 34/4. Previously this measured 4.1 x 2.6 cm with an SUV max of 8.2. Right level 2 node measures 2.2 x 1.3 cm within SUV max of 7.48. Previously this measured 1.8 x 1.5 cm with SUV max of 10.16. Right level 2/3 node measures 2.2 x 1.9 cm within SUV max of 7.46, image 36/4. Formally 2.0 x 1.7 cm with SUV max of 6.1. Right level 4 lymph node measures 1.7 x 1.8 cm within SUV max of 9.96, image 48/4. Previously 2.1 x 2.2 cm with SUV max of 7.89 Incidental CT findings: Tracheostomy tube in place. CHEST: FDG avid subcarinal lymph node measures 2.3 cm short axis with SUV max of 14.87. Previously this measured 2.3 cm within SUV max of 9.87. No FDG avid hilar or axillary lymph nodes. No suspicious FDG avid pulmonary nodules. 5 mm nodule within the right middle lobe is too small to characterize by PET-CT, image 47/8. Formally 7 mm. Incidental CT findings: none ABDOMEN/PELVIS: No abnormal hypermetabolic activity within the liver, pancreas, adrenal glands, or spleen. No hypermetabolic lymph nodes in the abdomen or pelvis. Incidental CT findings: none SKELETON: No focal hypermetabolic activity to suggest skeletal metastasis. Incidental CT findings: none IMPRESSION: 1. Mild decrease in size of base of tongue mass with persistent intense FDG uptake similar to previous exam. 2. Persistent, FDG avid adenopathy within bilateral cervical lymph node chains. When compared with the previous exam several of these nodes have decreased in size in the interval where as others are stable or mildly increased in size. 3. No significant change in enlarged FDG avid subcarinal lymph node compatible with nodal metastasis. 4. No signs of metastatic disease within the abdomen or pelvis. Electronically Signed   By: Kerby Moors M.D.   On: 08/06/2020 22:59   DG Carlena Hurl  SPEECH PATH  Result Date: 08/09/2020 Formatting of this result is different from the original. Objective Swallowing Evaluation: Type of Study: MBS-Modified Barium Swallow Study  Patient Details Name: Alexander [name] MRN: [id number] Date of Birth: May 16, 1960 Today's Date: 08/09/2020 Time: SLP Start Time (ACUTE ONLY): 8338 -SLP Stop Time (ACUTE ONLY): 1420 SLP Time Calculation (min) (ACUTE ONLY): 45 min Past Medical History: Past Medical  History: Diagnosis Date  Asthma   Cancer ([place])   GERD (gastroesophageal reflux disease)   Sleep apnea   CPAP- does not wear Past Surgical History: Past Surgical History: Procedure Laterality Date  BRONCHIAL NEEDLE ASPIRATION BIOPSY  05/22/2020  Procedure: BRONCHIAL NEEDLE ASPIRATION BIOPSIES;  Surgeon: Garner Nash, DO;  Location: [place] [facility];  Service: Pulmonary;;  DIRECT LARYNGOSCOPY  05/09/2020  Procedure: DIRECT LARYNGOSCOPY;  Surgeon: Rozetta Nunnery, MD;  Location: [facility] OR;  Service: ENT;;  IR IMAGING GUIDED PORT INSERTION  06/12/2020  MASS EXCISION N/A 05/09/2020  Procedure: EXCISION TONGUE MASS;  Surgeon: Rozetta Nunnery, MD;  Location: [facility] OR;  Service: ENT;  Laterality: N/A;  TONSILLECTOMY    TRACHEOSTOMY TUBE PLACEMENT  05/09/2020  Procedure: TRACHEOSTOMY;  Surgeon: Rozetta Nunnery, MD;  Location: [place];  Service: ENT;;  VIDEO BRONCHOSCOPY WITH ENDOBRONCHIAL ULTRASOUND N/A 05/22/2020  Procedure: VIDEO BRONCHOSCOPY WITH ENDOBRONCHIAL ULTRASOUND;  Surgeon: Garner Nash, DO;  Location: [place];  Service: Pulmonary;  Laterality: N/A; HPI: Pt is a 60 yo male referred by Dr Isidore Moos for MBS.  Pt has PMH + for Squamous cell carcinoma of base of left tongue diagnosed April 2022, p16 positive, clinical staging T4 N3 M1 squamous cell carcinoma s/p trach with use of PMSV. He is has had 3 chemotherapy treatments via a port and denies side effects.  Pt reports improvement with swallowing since chemo, stating his tumor felt like it was the size of an "orange" prior to  chemo. He admits to swallowing improvement with sensing smaller tumor as well.  Pt eats regular foods and drinks thin liquids.  Admits to occasional coughing with liquids more than foods.  Senses food retention, requiring liquids to help clear.  Denies requiring heimlich manuever, may cough up food on occasion.  He reports he takes his pills with liquids.  Potential for radiation to be discussed with MD per pt.  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  Subjective: pt sitting in chair, apprehensive to swallow barium Assessment / Plan / Recommendation CHL IP CLINICAL IMPRESSIONS 08/09/2020 Clinical Impression Pt presents with mild pharyngeal based dysphagia resulting in mild pharyngeal retention across all consistencies due to decreased tongue base retraction.  Mild vallecular/under epiglottis retention spills into open larynx allowing mild laryngeal penetration of liquids and trace *silent* aspiration of thin with small boluses.  Cues to take large sequential boluses of thin resulted in mild aspiration with reflexive cough response.  Various postures including chin tuck, head turn with and without chin tuck, supraglottic swallow did not prevent mild penetration or retention. Chin tuck worsened penetration as it spilled pharyngeal retention into open airway.   Pt largely senses mild retention and conducts NONCUED dry swallows to help clear (Up to 4 swallows with each bolus).  Barium tablet with nectar cleared easily through pharynx into esophagus but with backflow of nectar to pyriform sinus - suspect due to decreased UES relaxation.  Of note, variable anatomy findings present and radiologist not present to confirm - group number and frame references provided in detail in compensation area re: pharyngeal swallow.  Pt is protective of his airway overall and takes small bites/sips.  SLP advised he continue po diet with precautions including adding extra gravies/sauces to foods, conducting  dry swallows, intermittent throat clear/cough as needed and drinking slightly thicker liquids if not more coughing with thins.  Educated pt that if he is coughing with thin liquids, it is due to aspiration and  recommendation to cough strongly to clear and expectorate prn.  Advised he continue to consume water as much as possible due to pH neutral status allowing for "safer" mild aspiration.  Provided pt with exercises to maximize his swallow rehabilitation, pharyngeal contraction, laryngeal elevation, etc.  Reviewed importance for pt to continue po diet as long as possible throughout treatment for maintenance of swallowing musculature. Also reviewed risk factors for aspiration pneumonias including immune status.   Relayed importance of maintaining cough and expectoration strength for airway protection.  Recommendation follow up with SLP for swallowing pharyngeal swallowing exercises and comprehension of MBS, compensations, etc. Thanks for this consult. SLP Visit Diagnosis Dysphagia, pharyngeal phase (R13.13);Dysphagia, pharyngoesophageal phase (R13.14) Attention and concentration deficit following -- Frontal lobe and executive function deficit following -- Impact on safety and function Mild aspiration risk   CHL IP TREATMENT RECOMMENDATION 05/10/2020 Treatment Recommendations Therapy as outlined in treatment plan below   No flowsheet data found. CHL IP DIET RECOMMENDATION 08/09/2020 SLP Diet Recommendations Regular solids;Thin liquid;Nectar thick liquid Liquid Administration via Cup;Straw Medication Administration (No Data) Compensations Slow rate;Small sips/bites;Follow solids with liquid Postural Changes Remain semi-upright after after feeds/meals (Comment);Seated upright at 90 degrees   CHL IP OTHER RECOMMENDATIONS 08/09/2020 Recommended Consults Other (Comment) Oral Care Recommendations -- Other Recommendations --   CHL IP FOLLOW UP RECOMMENDATIONS 05/11/2020 Follow up Recommendations Home health SLP   CHL IP  FREQUENCY AND DURATION 05/10/2020 Speech Therapy Frequency (ACUTE ONLY) min 2x/week Treatment Duration 2 weeks      CHL IP ORAL PHASE 08/09/2020 Oral Phase Impaired Oral - Pudding Teaspoon -- Oral - Pudding Cup -- Oral - Honey Teaspoon -- Oral - Honey Cup -- Oral - Nectar Teaspoon NT Oral - Nectar Cup WFL;Piecemeal swallowing;Premature spillage Oral - Nectar Straw NT Oral - Thin Teaspoon NT Oral - Thin Cup WFL;Piecemeal swallowing Oral - Thin Straw NT Oral - Puree WFL;Piecemeal swallowing Oral - Mech Soft WFL;Piecemeal swallowing Oral - Regular NT Oral - Multi-Consistency WFL;Piecemeal swallowing Oral - Pill WFL Oral Phase - Comment --  CHL IP PHARYNGEAL PHASE 08/09/2020 Pharyngeal Phase Impaired Pharyngeal- Pudding Teaspoon -- Pharyngeal -- Pharyngeal- Pudding Cup -- Pharyngeal -- Pharyngeal- Honey Teaspoon -- Pharyngeal -- Pharyngeal- Honey Cup -- Pharyngeal -- Pharyngeal- Nectar Teaspoon -- Pharyngeal -- Pharyngeal- Nectar Cup Reduced tongue base retraction;Pharyngeal residue - valleculae;Pharyngeal residue - posterior pharnyx;Reduced epiglottic inversion;Reduced laryngeal elevation;Reduced airway/laryngeal closure Pharyngeal Material does not enter airway Pharyngeal- Nectar Straw Reduced epiglottic inversion;Reduced laryngeal elevation;Reduced airway/laryngeal closure;Pharyngeal residue - valleculae;Pharyngeal residue - posterior pharnyx;Penetration/Apiration after swallow;Reduced tongue base retraction Pharyngeal Material enters airway, remains ABOVE vocal cords and not ejected out Pharyngeal- Thin Teaspoon -- Pharyngeal -- Pharyngeal- Thin Cup Reduced laryngeal elevation;Reduced airway/laryngeal closure;Reduced epiglottic inversion;Reduced tongue base retraction;Pharyngeal residue - valleculae Pharyngeal Material enters airway, remains ABOVE vocal cords and not ejected out;Material enters airway, CONTACTS cords and not ejected out;Material enters airway, passes BELOW cords and not ejected out despite cough  attempt by patient Pharyngeal- Thin Straw NT Pharyngeal -- Pharyngeal- Puree Reduced epiglottic inversion;Reduced laryngeal elevation;Reduced airway/laryngeal closure;Reduced tongue base retraction;Pharyngeal residue - valleculae;Pharyngeal residue - posterior pharnyx Pharyngeal Material does not enter airway Pharyngeal- Mechanical Soft Reduced epiglottic inversion;Reduced laryngeal elevation;Reduced airway/laryngeal closure;Reduced tongue base retraction;Pharyngeal residue - valleculae;Pharyngeal residue - posterior pharnyx Pharyngeal Material does not enter airway Pharyngeal- Regular NT Pharyngeal -- Pharyngeal- Multi-consistency Reduced epiglottic inversion;Reduced anterior laryngeal mobility;Reduced tongue base retraction;Reduced laryngeal elevation;Pharyngeal residue - valleculae;Pharyngeal residue - posterior pharnyx Pharyngeal Material does not enter airway Pharyngeal- Pill -- Pharyngeal -- Pharyngeal  Comment pt appears with protrusion in sagittal view at posterior pharynx at C2-C3, viewed at flouro loop #13 128/154, #15 61/170, A-P view #18 57/137 ? on left and sagittal view #26 47/111; radiologist not present to confirm; ? Source; various postures tested including chin tuck with and without head turn to the left and head turn right without improvement in pharyngeal clearance or prevention of penetration of liquids mixed with secretions; head tilted posterior while swallowing barium tablet independently as pt stated "That's what I have always done" assisted to retain residual in posterior pharynx; dry swallows and liquid swallow faciltate clearance of pharynx, cued gentle throat clear/cough removed trace penetrates and dry swallows decrease retention; pt conducts multiple swallows up to 4 with each bolus - effectively clearing pharynx  CHL IP CERVICAL ESOPHAGEAL PHASE 08/09/2020 Cervical Esophageal Phase Impaired Pudding Teaspoon -- Pudding Cup -- Honey Teaspoon -- Honey Cup -- Nectar Teaspoon -- Nectar Cup  -- Nectar Straw -- Thin Teaspoon -- Thin Cup -- Thin Straw -- Puree -- Mechanical Soft -- Regular -- Multi-consistency -- Pill -- Cervical Esophageal Comment Appearance of mild backflow of nectar liquids to  pyriform sinus when pt swallowed barium tablet with nectar, dry swallows cleared Kathleen Lime, MS [facility] SLP Acute Rehab Services Office [phone] Pager [phone] Macario Golds 08/09/2020, 4:26 PM                 IMPRESSION/PLAN:  This is a delightful patient with head and neck cancer. I discussed him imaging with him and his family.  Given that he has not developed progressive metastatic disease but continues to have oligo metastatic disease and locally advanced disease with limited response to systemic therapy, I recommend radiotherapy for this patient. Recommend concurrent chemotherapy if feasible per Dr Chryl Heck  We discussed the potential risks, benefits, and side effects of radiotherapy. We talked in detail about acute and late effects. We discussed that some of the most bothersome acute effects may be mucositis, dysgeusia, salivary changes, skin irritation, hair loss, dehydration, weight loss and fatigue. We talked about late effects which include but are not necessarily limited to dysphagia, hypothyroidism, nerve injury, vascular injury, spinal cord injury, xerostomia, trismus, neck edema, and potential injury to any of the tissues in the head and neck region. No guarantees of treatment were given. A consent form was signed and placed in the patient's medical record. The patient is enthusiastic about proceeding with treatment. I look forward to participating in the patient's care.    In summary, Alexander [name] is agreeable to ChRT to the H+N and subcarinal mass given the mild regression of disease with induction systemic therapy  1) Refer to Dr Benson Norway for dental clearance 2) Refer back to [facility] as his breathing seems good and he would like to get the trach downgraded or removed. 3) He is  agreeable to a PEG tube so we will arrange that as well 4) Will arrange CT sim with contrast once dental clearance occurs (ideally trach disposition will be complete by then too, but that is not essential). 5) Will add to tumor board again   He knows current keytruda/chemotherapy may continue for now (pending Dr. Rob Hickman recommendations) until ChRT is ready to go   On date of service, in total, I spent 35 minutes on this encounter. Patient was seen in person.  __________________________________________   Eppie Gibson, MD   This document serves as a record of services personally performed by Eppie Gibson, MD. It was created on her behalf by  Roney Mans, a trained medical scribe. The creation of this record is based on the scribe's personal observations and the provider's statements to them. This document has been checked and approved by the attending provider.

## 2020-08-10 ENCOUNTER — Ambulatory Visit
Admission: RE | Admit: 2020-08-10 | Discharge: 2020-08-10 | Disposition: A | Discharge status: Home / Self Care | Payer: Commercial Managed Care - PPO | Source: Ambulatory Visit | Attending: Radiation Oncology | Admitting: Radiation Oncology

## 2020-08-10 ENCOUNTER — Other Ambulatory Visit: Payer: Self-pay | Admitting: Hematology and Oncology

## 2020-08-10 VITALS — BP 100/67 | HR 89 | Temp 97.4°F | Resp 18 | Ht 75.0 in | Wt 275.0 lb

## 2020-08-10 DIAGNOSIS — C01 Malignant neoplasm of base of tongue: Secondary | ICD-10-CM | POA: Diagnosis present

## 2020-08-10 DIAGNOSIS — C029 Malignant neoplasm of tongue, unspecified: Secondary | ICD-10-CM

## 2020-08-10 DIAGNOSIS — G4730 Sleep apnea, unspecified: Secondary | ICD-10-CM | POA: Insufficient documentation

## 2020-08-10 DIAGNOSIS — Z803 Family history of malignant neoplasm of breast: Secondary | ICD-10-CM | POA: Insufficient documentation

## 2020-08-10 DIAGNOSIS — Z79899 Other long term (current) drug therapy: Secondary | ICD-10-CM | POA: Insufficient documentation

## 2020-08-10 DIAGNOSIS — K219 Gastro-esophageal reflux disease without esophagitis: Secondary | ICD-10-CM | POA: Diagnosis not present

## 2020-08-10 DIAGNOSIS — C770 Secondary and unspecified malignant neoplasm of lymph nodes of head, face and neck: Secondary | ICD-10-CM | POA: Diagnosis not present

## 2020-08-10 DIAGNOSIS — Z800 Family history of malignant neoplasm of digestive organs: Secondary | ICD-10-CM | POA: Insufficient documentation

## 2020-08-10 NOTE — Progress Notes (Signed)
Mr. Davie presents today to discuss possibility of pursuing radiation to H&N cancer.  Recently had a modified barium swallow study.  08/09/2020 --Clinical Impression Pt presents with mild pharyngeal based dysphagia resulting in mild pharyngeal retention across all consistencies due to decreased tongue base retraction.  Mild vallecular/under epiglottis retention spills into open larynx allowing mild laryngeal penetration of liquids and trace *silent* aspiration of thin with small boluses.  Cues to take large sequential boluses of thin resulted in mild aspiration with reflexive cough response.   --Various postures including chin tuck, head turn with and without chin tuck, supraglottic swallow did not prevent mild penetration or retention. Chin tuck worsened penetration as it spilled pharyngeal retention into open airway.    --Pt largely senses mild retention and conducts NONCUED dry swallows to help clear (Up to 4 swallows with each bolus).   --Barium tablet with nectar cleared easily through pharynx into esophagus but with backflow of nectar to pyriform sinus - suspect due to decreased UES relaxation.   --Of note, variable anatomy findings present and radiologist not present to confirm - group number and frame references provided in detail in compensation area re: pharyngeal swallow.   --Pt is protective of his airway overall and takes small bites/sips.  SLP advised he continue po diet with precautions including adding extra gravies/sauces to foods, conducting dry swallows, intermittent throat clear/cough as needed and drinking slightly thicker liquids if not more coughing with thins.   --Educated pt that if he is coughing with thin liquids, it is due to aspiration and recommendation to cough strongly to clear and expectorate prn.  Advised he continue to consume water as much as possible due to pH neutral status allowing for "safer" mild aspiration.   --Provided pt with exercises to maximize his swallow  rehabilitation, pharyngeal contraction, laryngeal elevation, etc.  Reviewed importance for pt to continue po diet as long as possible throughout treatment for maintenance of swallowing musculature. Also reviewed risk factors for aspiration pneumonias including immune status.   Relayed importance of maintaining cough and expectoration strength for airway protection.   --Recommendation follow up with SLP for swallowing pharyngeal swallowing exercises and comprehension of MBS, compensations, etc. --Some coughing and difficulty swallowing (must have liquids at all times), Denies any neck or throat pain. Denies any ear or jaw pain or difficulty opening his mouth. Denies any nausea or changes in appeitte. Tolerated first few cycles of chemo well so far. Denies any changes in urinary or bowel habits. Feels vocal hoarseness is resolving slowly.   PET Scan 08/06/2020 --IMPRESSION: 1. Mild decrease in size of base of tongue mass with persistent intense FDG uptake similar to previous exam. 2. Persistent, FDG avid adenopathy within bilateral cervical lymph node chains. When compared with the previous exam several of these nodes have decreased in size in the interval where as others are stable or mildly increased in size. 3. No significant change in enlarged FDG avid subcarinal lymph node compatible with nodal metastasis. 4. No signs of metastatic disease within the abdomen or pelvis.  Denies any neck or throat pain. Reports occasional difficulty swallowing, but states as long as he has something to drink it usually isn't an issue. Denies any ear or jaw pain, or difficulty opening mouth. Denies any nausea, diarrhea, constipation, or changes in appetite. States he is tolerating chemotherapy well so far. He is still not certain if he wants to pursue radiation, but is interested in discussing options with Dr. Glenford Peers Readings from Last 3  Encounters:  08/10/20 275 lb (124.7 kg)  08/06/20 267 lb (121.1 kg)   07/26/20 273 lb (123.8 kg)   Vitals:   08/10/20 0907  BP: 100/67  Pulse: 89  Resp: 18  Temp: (!) 97.4 F (36.3 C)  SpO2: 100%

## 2020-08-11 ENCOUNTER — Encounter: Payer: Self-pay | Admitting: Radiation Oncology

## 2020-08-11 NOTE — Progress Notes (Signed)
Dental Form with Estimates of Radiation Dose      Diagnosis:    ICD-10-CM   1. 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 Squamous cell cancer of tongue (HCC)  C02.9       Prognosis: has oligometastatic disease but there is a modest chance of long term survival  UPDATED DOSE GRAPHIC ABOVE, POST pet.   PT S/P INDUCTION THERAPY, WILL GET CHRT.  Anticipated # of fractions: 35    Daily?: yes  # of weeks of radiotherapy: 7  Chemotherapy?: yes  Anticipated xerostomia:  Mild permanent   Pre-simulation needs:  scatter protection    Simulation: ASAP     Other Notes: GIVEN PROGNOSIS AND CURRENT SYSTEMIC THERAPY I WOULD LIKE TO AVOID DENTAL EXTRACTIONS UNTIL ABSOLUTELY NEEDED FOR PROBLEMATIC TEETH.  I WILL AGGRESSIVELY MOLD RT FROM ROOTS, AND IF THERE ARE ROOTS ESPECIALLY IMPORTANT TO AVOID, PLEASE LET ME KNOW. THANKS!  Please contact Eppie Gibson, MD, with patient's disposition after evaluation and/or dental treatment.

## 2020-08-13 ENCOUNTER — Other Ambulatory Visit: Payer: Self-pay

## 2020-08-13 ENCOUNTER — Encounter: Payer: Self-pay | Admitting: Hematology and Oncology

## 2020-08-16 ENCOUNTER — Encounter: Payer: Commercial Managed Care - PPO | Admitting: Nutrition

## 2020-08-16 ENCOUNTER — Ambulatory Visit: Payer: Commercial Managed Care - PPO

## 2020-08-16 ENCOUNTER — Ambulatory Visit: Payer: Commercial Managed Care - PPO | Admitting: Nurse Practitioner

## 2020-08-16 ENCOUNTER — Other Ambulatory Visit: Payer: Commercial Managed Care - PPO

## 2020-08-16 ENCOUNTER — Other Ambulatory Visit (HOSPITAL_COMMUNITY): Payer: Commercial Managed Care - PPO | Admitting: Dentistry

## 2020-08-17 ENCOUNTER — Inpatient Hospital Stay (HOSPITAL_BASED_OUTPATIENT_CLINIC_OR_DEPARTMENT_OTHER): Payer: BC Managed Care – PPO | Admitting: Hematology

## 2020-08-17 ENCOUNTER — Other Ambulatory Visit: Payer: Self-pay

## 2020-08-17 ENCOUNTER — Encounter: Payer: Self-pay | Admitting: Hematology

## 2020-08-17 ENCOUNTER — Inpatient Hospital Stay: Payer: BC Managed Care – PPO | Attending: Hematology and Oncology

## 2020-08-17 ENCOUNTER — Inpatient Hospital Stay: Payer: BC Managed Care – PPO

## 2020-08-17 VITALS — BP 118/78 | HR 86 | Temp 98.0°F | Resp 17 | Wt 279.7 lb

## 2020-08-17 DIAGNOSIS — Z5112 Encounter for antineoplastic immunotherapy: Secondary | ICD-10-CM | POA: Diagnosis not present

## 2020-08-17 DIAGNOSIS — Z5111 Encounter for antineoplastic chemotherapy: Secondary | ICD-10-CM | POA: Insufficient documentation

## 2020-08-17 DIAGNOSIS — Z79899 Other long term (current) drug therapy: Secondary | ICD-10-CM | POA: Insufficient documentation

## 2020-08-17 DIAGNOSIS — C01 Malignant neoplasm of base of tongue: Secondary | ICD-10-CM | POA: Diagnosis not present

## 2020-08-17 DIAGNOSIS — C109 Malignant neoplasm of oropharynx, unspecified: Secondary | ICD-10-CM

## 2020-08-17 DIAGNOSIS — C771 Secondary and unspecified malignant neoplasm of intrathoracic lymph nodes: Secondary | ICD-10-CM | POA: Insufficient documentation

## 2020-08-17 LAB — CMP (CANCER CENTER ONLY)
ALT: 18 U/L (ref 0–44)
AST: 19 U/L (ref 15–41)
Albumin: 3.9 g/dL (ref 3.5–5.0)
Alkaline Phosphatase: 58 U/L (ref 38–126)
Anion gap: 6 (ref 5–15)
BUN: 19 mg/dL (ref 6–20)
CO2: 28 mmol/L (ref 22–32)
Calcium: 9.4 mg/dL (ref 8.9–10.3)
Chloride: 104 mmol/L (ref 98–111)
Creatinine: 1.17 mg/dL (ref 0.61–1.24)
GFR, Estimated: >60 mL/min (ref >60)
Glucose, Bld: 96 mg/dL (ref 70–99)
Potassium: 4 mmol/L (ref 3.5–5.1)
Sodium: 138 mmol/L (ref 135–145)
Total Bilirubin: 0.7 mg/dL (ref 0.3–1.2)
Total Protein: 7.3 g/dL (ref 6.5–8.1)

## 2020-08-17 LAB — CBC WITH DIFFERENTIAL (CANCER CENTER ONLY)
Abs Immature Granulocytes: 0.02 10*3/uL (ref 0.00–0.07)
Basophils Absolute: 0 10*3/uL (ref 0.0–0.1)
Basophils Relative: 1 %
Eosinophils Absolute: 0.1 10*3/uL (ref 0.0–0.5)
Eosinophils Relative: 2 %
HCT: 36.3 % — ABNORMAL LOW (ref 39.0–52.0)
Hemoglobin: 12.3 g/dL — ABNORMAL LOW (ref 13.0–17.0)
Immature Granulocytes: 1 %
Lymphocytes Relative: 33 %
Lymphs Abs: 1.1 10*3/uL (ref 0.7–4.0)
MCH: 31.4 pg (ref 26.0–34.0)
MCHC: 33.9 g/dL (ref 30.0–36.0)
MCV: 92.6 fL (ref 80.0–100.0)
Monocytes Absolute: 0.4 10*3/uL (ref 0.1–1.0)
Monocytes Relative: 11 %
Neutro Abs: 1.9 10*3/uL (ref 1.7–7.7)
Neutrophils Relative %: 52 %
Platelet Count: 99 10*3/uL — ABNORMAL LOW (ref 150–400)
RBC: 3.92 MIL/uL — ABNORMAL LOW (ref 4.22–5.81)
RDW: 18.6 % — ABNORMAL HIGH (ref 11.5–15.5)
WBC Count: 3.5 10*3/uL — ABNORMAL LOW (ref 4.0–10.5)
nRBC: 0 % (ref 0.0–0.2)

## 2020-08-17 LAB — TSH: TSH: 2.225 u[IU]/mL (ref 0.320–4.118)

## 2020-08-17 MED ORDER — SODIUM CHLORIDE 0.9 % IV SOLN
Freq: Once | INTRAVENOUS | Status: AC
  Filled 2020-08-17: qty 250

## 2020-08-17 MED ORDER — SODIUM CHLORIDE 0.9 % IV SOLN
200.0000 mg | Freq: Once | INTRAVENOUS | Status: AC
  Administered 2020-08-17: 200 mg via INTRAVENOUS
  Filled 2020-08-17: qty 8

## 2020-08-17 MED ORDER — PALONOSETRON HCL INJECTION 0.25 MG/5ML
INTRAVENOUS | Status: AC
  Filled 2020-08-17: qty 5

## 2020-08-17 MED ORDER — SODIUM CHLORIDE 0.9 % IV SOLN
960.0000 mg/m2/d | INTRAVENOUS | Status: DC
  Administered 2020-08-17: 10000 mg via INTRAVENOUS
  Filled 2020-08-17: qty 200

## 2020-08-17 MED ORDER — PALONOSETRON HCL INJECTION 0.25 MG/5ML
0.2500 mg | Freq: Once | INTRAVENOUS | Status: AC
  Administered 2020-08-17: 0.25 mg via INTRAVENOUS

## 2020-08-17 MED ORDER — SODIUM CHLORIDE 0.9 % IV SOLN
668.2500 mg | Freq: Once | INTRAVENOUS | Status: AC
  Administered 2020-08-17: 670 mg via INTRAVENOUS
  Filled 2020-08-17: qty 67

## 2020-08-17 MED ORDER — DEXAMETHASONE SODIUM PHOSPHATE 100 MG/10ML IJ SOLN
10.0000 mg | Freq: Once | INTRAMUSCULAR | Status: AC
Start: 2020-08-17 — End: 2020-08-17
  Administered 2020-08-17: 10 mg via INTRAVENOUS
  Filled 2020-08-17: qty 10

## 2020-08-17 MED ORDER — SODIUM CHLORIDE 0.9 % IV SOLN
150.0000 mg | Freq: Once | INTRAVENOUS | Status: AC
  Administered 2020-08-17: 150 mg via INTRAVENOUS
  Filled 2020-08-17: qty 150

## 2020-08-17 NOTE — Patient Instructions (Signed)
[facility]  Discharge Instructions: Thank you for choosing [facility] to provide your oncology and hematology care.   If you have a lab appointment with the [facility], please go directly to the [facility] and check in at the registration area.   Wear comfortable clothing and clothing appropriate for easy access to any Portacath or PICC line.   We strive to give you quality time with your provider. You may need to reschedule your appointment if you arrive late (15 or more minutes).  Arriving late affects you and other patients whose appointments are after yours.  Also, if you miss three or more appointments without notifying the office, you may be dismissed from the clinic at the provider's discretion.      For prescription refill requests, have your pharmacy contact our office and allow 72 hours for refills to be completed.    Today you received the following chemotherapy and/or immunotherapy agents: Keytruda/Carboplatin/5FU      To help prevent nausea and vomiting after your treatment, we encourage you to take your nausea medication as directed.  BELOW ARE SYMPTOMS THAT SHOULD BE REPORTED IMMEDIATELY: *FEVER GREATER THAN 100.4 F (38 C) OR HIGHER *CHILLS OR SWEATING *NAUSEA AND VOMITING THAT IS NOT CONTROLLED WITH YOUR NAUSEA MEDICATION *UNUSUAL SHORTNESS OF BREATH *UNUSUAL BRUISING OR BLEEDING *URINARY PROBLEMS (pain or burning when urinating, or frequent urination) *BOWEL PROBLEMS (unusual diarrhea, constipation, pain near the anus) TENDERNESS IN MOUTH AND THROAT WITH OR WITHOUT PRESENCE OF ULCERS (sore throat, sores in mouth, or a toothache) UNUSUAL RASH, SWELLING OR PAIN  UNUSUAL VAGINAL DISCHARGE OR ITCHING   Items with * indicate a potential emergency and should be followed up as soon as possible or go to the Emergency Department if any problems should occur.  Please show the CHEMOTHERAPY ALERT CARD or IMMUNOTHERAPY ALERT CARD  at check-in to the Emergency Department and triage nurse.  Should you have questions after your visit or need to cancel or reschedule your appointment, please contact [facility]  Dept: [phone]  and follow the prompts.  Office hours are 8:00 a.m. to 4:30 p.m. Monday - Friday. Please note that voicemails left after 4:00 p.m. may not be returned until the following business day.  We are closed weekends and major holidays. You have access to a nurse at all times for urgent questions. Please call the main number to the clinic Dept: [phone] and follow the prompts.   For any non-urgent questions, you may also contact your provider using MyChart. We now offer e-Visits for anyone 102 and older to request care online for non-urgent symptoms. For details visit [email or web address].   Also download the MyChart app! Go to the app store, search "MyChart", open the app, select [facility], and log in with your MyChart username and password.  Due to Covid, a mask is required upon entering the hospital/clinic. If you do not have a mask, one will be given to you upon arrival. For doctor visits, patients may have 1 support person aged 68 or older with them. For treatment visits, patients cannot have anyone with them due to current Covid guidelines and our immunocompromised population.

## 2020-08-17 NOTE — Progress Notes (Signed)
[facility]   Telephone:([phone] Fax:([phone]   Clinic Follow up Note   Patient Care Team: Pcp, No as PCP - General [place], Stephani Police, RN as Oncology Nurse Navigator Benay Pike, MD as Consulting Physician (Hematology and Oncology) Rozetta Nunnery, MD as Consulting Physician (Otolaryngology) Eppie Gibson, MD as Consulting Physician (Radiation Oncology)  Date of Service:  08/17/2020  CHIEF COMPLAINT: f/u of tongue cancer  SUMMARY OF ONCOLOGIC HISTORY: Oncology History  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[place])  05/17/2020 Initial Diagnosis   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[place])    05/18/2020 Cancer Staging   Staging form: Pharynx - HPV-Mediated Oropharynx, AJCC 8th Edition - Clinical stage from 05/18/2020: cT4, cN3, p16+ - Signed by Eppie Gibson, MD on 05/22/2020    06/01/2020 Cancer Staging   Staging form: Pharynx - HPV-Mediated Oropharynx, AJCC 8th Edition - Pathologic stage from 06/01/2020: No Stage Recommended (cT4, cN3, cM1, p16+) - Signed by Benay Pike, MD on 06/01/2020  Stage prefix: Initial diagnosis    06/12/2020 -  Chemotherapy    Patient is on Treatment Plan: HEAD/NECK PEMBROLIZUMAB + CARBOPLATIN + 5FU Q21D X 6 CYCLES / PEMBROLIZUMAB Q21D          CURRENT THERAPY:  pembrolizumab +carboplatin+ 5FU Q21D, starting 06/12/20  INTERVAL HISTORY:  Alexander [name] is here for a follow up of tongue cancer. He was last seen by Dr. Chryl Heck on 07/26/20. I am seeing this patient in her absence. He presents to the clinic alone. He reports he is doing well. "Nothing noticeable" from chemotherapy. He denies fatigue as well. He does not some darkening to his palms but was aware this could happen from chemo. He reports some residual phlegm production, which he attributes to his trach. He notes he is not needing to take any of his nausea medication. He notes the main area of swelling to his left neck, which he notes started at about  the size of a peach, has noticeably decreased in size.  All other systems were reviewed with the patient and are negative.  MEDICAL HISTORY:  Past Medical History:  Diagnosis Date   Asthma    Cancer ([place])    GERD (gastroesophageal reflux disease)    Sleep apnea    CPAP- does not wear    SURGICAL HISTORY: Past Surgical History:  Procedure Laterality Date   BRONCHIAL NEEDLE ASPIRATION BIOPSY  05/22/2020   Procedure: BRONCHIAL NEEDLE ASPIRATION BIOPSIES;  Surgeon: Garner Nash, DO;  Location: [facility];  Service: Pulmonary;;   DIRECT LARYNGOSCOPY  05/09/2020   Procedure: DIRECT LARYNGOSCOPY;  Surgeon: Rozetta Nunnery, MD;  Location: [facility] OR;  Service: ENT;;   IR IMAGING GUIDED PORT INSERTION  06/12/2020   MASS EXCISION N/A 05/09/2020   Procedure: EXCISION TONGUE MASS;  Surgeon: Rozetta Nunnery, MD;  Location: [facility] OR;  Service: ENT;  Laterality: N/A;   TONSILLECTOMY     TRACHEOSTOMY TUBE PLACEMENT  05/09/2020   Procedure: TRACHEOSTOMY;  Surgeon: Rozetta Nunnery, MD;  Location: [facility];  Service: ENT;;   VIDEO BRONCHOSCOPY WITH ENDOBRONCHIAL ULTRASOUND N/A 05/22/2020   Procedure: VIDEO BRONCHOSCOPY WITH ENDOBRONCHIAL ULTRASOUND;  Surgeon: Garner Nash, DO;  Location: [facility];  Service: Pulmonary;  Laterality: N/A;    I have reviewed the social history and family history with the patient and they are unchanged from previous note.  ALLERGIES:  has No Known Allergies.  MEDICATIONS:  Current Outpatient Medications  Medication Sig Dispense Refill  albuterol (PROVENTIL) (2.5 MG/3ML) 0.083% nebulizer solution Take 3 mLs (2.5 mg total) by nebulization every 6 (six) hours as needed for wheezing or shortness of breath. 120 mL 12   albuterol (VENTOLIN HFA) 108 (90 Base) MCG/ACT inhaler Inhale 2 puffs into the lungs as needed for wheezing or shortness of breath.     bacitracin ointment Apply 1 application topically every 8 (eight) hours. Apply around trach wound 120 g 0    cetirizine (ZYRTEC) 10 MG tablet Take 10 mg by mouth daily.     dexamethasone (DECADRON) 4 MG tablet Take 2 tablets (8 mg total) by mouth daily. Start the day after carboplatin chemotherapy for 3 days. 30 tablet 1   fluticasone (FLONASE) 50 MCG/ACT nasal spray Place 2 sprays into both nostrils daily. Place 1 spray each nostril at night 16 g 11   Fluticasone-Salmeterol (ADVAIR) 100-50 MCG/DOSE AEPB Inhale 1 puff into the lungs daily.     lidocaine-prilocaine (EMLA) cream Apply to affected area once as directed 30 g 3   LORazepam (ATIVAN) 0.5 MG tablet Take 1 tablet (0.5 mg total) by mouth every 6 (six) hours as needed (Nausea or vomiting). 30 tablet 0   omeprazole (PRILOSEC) 40 MG capsule Take 40 mg by mouth daily.     ondansetron (ZOFRAN) 8 MG tablet Take 1 tablet (8 mg total) by mouth 2 (two) times daily as needed for refractory nausea / vomiting. Start on day 3 after carboplatin chemo. 30 tablet 1   prochlorperazine (COMPAZINE) 10 MG tablet Take 1 tablet (10 mg total) by mouth every 6 (six) hours as needed for nausea or vomiting. 30 tablet 1   No current facility-administered medications for this visit.    PHYSICAL EXAMINATION: ECOG PERFORMANCE STATUS: 1 - Symptomatic but completely ambulatory  Vitals:   08/17/20 0909  BP: 118/78  Pulse: 86  Resp: 17  Temp: 98 F (36.7 C)  SpO2: 98%   Filed Weights   08/17/20 0909  Weight: 279 lb 11.2 oz (126.9 kg)    GENERAL:alert, no distress and comfortable SKIN: skin color, texture, turgor are normal, no rashes or significant lesions EYES: normal, Conjunctiva are pink and non-injected, sclera clear  NECK: supple, thyroid normal size, non-tender, without nodularity LYMPH:  no palpable lymphadenopathy in axillary; palpable cervical lymph node measures 1.5 cm LUNGS: clear to auscultation and percussion with normal breathing effort HEART: regular rate & rhythm and no murmurs and no lower extremity edema ABDOMEN:abdomen soft, non-tender and  normal bowel sounds Musculoskeletal:no cyanosis of digits and no clubbing  NEURO: alert & oriented x 3 with fluent speech, no focal motor/sensory deficits  LABORATORY DATA:  I have reviewed the data as listed CBC Latest Ref Rng & Units 08/17/2020 07/26/2020 07/05/2020  WBC 4.0 - 10.5 K/uL 3.5(L) 3.9(L) 4.0  Hemoglobin 13.0 - 17.0 g/dL 12.3(L) 12.5(L) 12.1(L)  Hematocrit 39.0 - 52.0 % 36.3(L) 37.4(L) 35.8(L)  Platelets 150 - 400 K/uL 99(L) 131(L) 227     CMP Latest Ref Rng & Units 08/17/2020 07/26/2020 07/05/2020  Glucose 70 - 99 mg/dL 96 111(H) 87  BUN 6 - 20 mg/dL 19 10 9   Creatinine 0.61 - 1.24 mg/dL 1.17 1.07 0.96  Sodium 135 - 145 mmol/L 138 140 141  Potassium 3.5 - 5.1 mmol/L 4.0 4.0 3.6  Chloride 98 - 111 mmol/L 104 104 105  CO2 22 - 32 mmol/L 28 29 26   Calcium 8.9 - 10.3 mg/dL 9.4 9.7 9.4  Total Protein 6.5 - 8.1 g/dL 7.3 6.9 6.7  Total Bilirubin 0.3 - 1.2 mg/dL 0.7 0.7 0.6  Alkaline Phos 38 - 126 U/L 58 58 63  AST 15 - 41 U/L 19 13(L) 24  ALT 0 - 44 U/L 18 12 32    RADIOGRAPHIC STUDIES: I have personally reviewed the radiological images as listed and agreed with the findings in the report. No results found.   ASSESSMENT & PLAN:  Jame Morrell is a 60 y.o. male with   1. 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 CT imaging showed: 5 cm soft tissue mass centered in region of left tongue base extending posteriorly and inferiorly, resulting in moderate/severe effacement of oropharyngeal airway; extensive bilateral cervical lymphadenopathy, with concern for extracapsular extension -He was evaluated by Dr. [name] on 05/03/20. Laryngoscopy performed at that time showed clear nasopharynx, base of tongue full on left side and epiglottis slightly pushed back posteriorly with questionable ulcer on left base of tongue extending down to left vallecular  area. -Biopsy on 05/09/20 revealed invasive squamous cell carcinoma, strongly p16 positive. -Emergent tracheostomy was performed at time of biopsy. -Chest CT 05/11/20 showed subcarinal mass/adenopathy without acute intrathoracic pathology -Biopsy of a subcarinal lymph node on 05/22/20 confirmed metastatic squamous cell -PET scan on 05/24/20 showed: large mass in oropharynx extending into supraglottic larynx associated with bilateral cervical adenopathy, area showing marked hypermetabolic features; subcarinal nodal mass with intense FDG uptake; questionable areas of variable density in liver; small, nonspecific pulmonary nodules, not associated with increased metabolic activity. -Began pembrolizumab/carboplatin/5-FU on 06/12/20, now s/p 3 cycles -restaging PET 08/06/20 showed: mild decrease in size of base of tongue mass with persistent intense FDG uptake similar to previous exam; persistent FDG-avid adenopathy within bilateral cervical lymph node chains, mixed response with some decreased in size where as others are stable to mildly increased; no significant change in enlarged FDG-avid subcarinal lymph node; no signs of metastatic disease within abdomen or pelvis.  -He was referred back to Dr. Isidore Moos on 08/10/20, who personally reviewed his PET imaging with the patient and his family. Per her note, she recommended concurrent chemoradiation if feasible per Dr. Chryl Heck. His case will also be reviewed again in tumor board. -He will continue chemotherapy for now until he begins radiation. -He will receive weekly cisplatin with concurrent radiation.  I discussed the benefit and side effects with him in details, especially hearing loss, neuropathy, hemorrhagic cystitis, nausea, fatigue, risk of infection, etc.  He voiced good understanding and agreed to proceed. -Labs reviewed, platelets slightly low today (08/17/20), I will slightly decrease carboplatin dose from AUC 5 to 4.5, and proceed with treatment today -When we  have his radiation schedule, will schedule his weekly cisplatin, lab and f/u with concurrent radiation    PLAN: -proceed with C4 pembrolizumab/carboplatin/5-FU, with dose reduction of carbo to AUC 4.5, due to low platelets -radiation therapy to start as indicated by Dr. Isidore Moos, plan to give weekly cisplatin with concurrent radiation.    No problem-specific Assessment & Plan notes found for this encounter.   No orders of the defined types were placed in this encounter.  All questions were answered. The patient knows to call the clinic with any problems, questions or concerns. No barriers to learning was detected. The total time spent in the appointment was 30 minutes.     Truitt Merle, MD 08/17/2020   I, Wilburn Mylar, am acting as scribe for Truitt Merle,  MD.   I have reviewed the above documentation for accuracy and completeness, and I agree with the above.

## 2020-08-17 NOTE — Patient Instructions (Signed)
Implanted Port Home Guide An implanted port is a device that is placed under the skin. It is usually placed in the chest. The device can be used to give IV medicine, to take blood, or for dialysis. You may have an implanted port if: You need IV medicine that would be irritating to the small veins in your hands or arms. You need IV medicines, such as antibiotics, for a long period of time. You need IV nutrition for a long period of time. You need dialysis. When you have a port, your health care provider can choose to use the port instead of veins in your arms for these procedures. You may have fewer limitations when using a port than you would if you used other types of long-term IVs, and you will likely be able to return to normal activities afteryour incision heals. An implanted port has two main parts: Reservoir. The reservoir is the part where a needle is inserted to give medicines or draw blood. The reservoir is round. After it is placed, it appears as a small, raised area under your skin. Catheter. The catheter is a thin, flexible tube that connects the reservoir to a vein. Medicine that is inserted into the reservoir goes into the catheter and then into the vein. How is my port accessed? To access your port: A numbing cream may be placed on the skin over the port site. Your health care provider will put on a mask and sterile gloves. The skin over your port will be cleaned carefully with a germ-killing soap and allowed to dry. Your health care provider will gently pinch the port and insert a needle into it. Your health care provider will check for a blood return to make sure the port is in the vein and is not clogged. If your port needs to remain accessed to get medicine continuously (constant infusion), your health care provider will place a clear bandage (dressing) over the needle site. The dressing and needle will need to be changed every week, or as told by your health care provider. What  is flushing? Flushing helps keep the port from getting clogged. Follow instructions from your health care provider about how and when to flush the port. Ports are usually flushed with saline solution or a medicine called heparin. The need for flushing will depend on how the port is used: If the port is only used from time to time to give medicines or draw blood, the port may need to be flushed: Before and after medicines have been given. Before and after blood has been drawn. As part of routine maintenance. Flushing may be recommended every 4-6 weeks. If a constant infusion is running, the port may not need to be flushed. Throw away any syringes in a disposal container that is meant for sharp items (sharps container). You can buy a sharps container from a pharmacy, or you can make one by using an empty hard plastic bottle with a cover. How long will my port stay implanted? The port can stay in for as long as your health care provider thinks it is needed. When it is time for the port to come out, a surgery will be done to remove it. The surgery will be similar to the procedure that was done to putthe port in. Follow these instructions at home:  Flush your port as told by your health care provider. If you need an infusion over several days, follow instructions from your health care provider about how to take   care of your port site. Make sure you: Wash your hands with soap and water before you change your dressing. If soap and water are not available, use alcohol-based hand sanitizer. Change your dressing as told by your health care provider. Place any used dressings or infusion bags into a plastic bag. Throw that bag in the trash. Keep the dressing that covers the needle clean and dry. Do not get it wet. Do not use scissors or sharp objects near the tube. Keep the tube clamped, unless it is being used. Check your port site every day for signs of infection. Check for: Redness, swelling, or  pain. Fluid or blood. Pus or a bad smell. Protect the skin around the port site. Avoid wearing bra straps that rub or irritate the site. Protect the skin around your port from seat belts. Place a soft pad over your chest if needed. Bathe or shower as told by your health care provider. The site may get wet as long as you are not actively receiving an infusion. Return to your normal activities as told by your health care provider. Ask your health care provider what activities are safe for you. Carry a medical alert card or wear a medical alert bracelet at all times. This will let health care providers know that you have an implanted port in case of an emergency. Get help right away if: You have redness, swelling, or pain at the port site. You have fluid or blood coming from your port site. You have pus or a bad smell coming from the port site. You have a fever. Summary Implanted ports are usually placed in the chest for long-term IV access. Follow instructions from your health care provider about flushing the port and changing bandages (dressings). Take care of the area around your port by avoiding clothing that puts pressure on the area, and by watching for signs of infection. Protect the skin around your port from seat belts. Place a soft pad over your chest if needed. Get help right away if you have a fever or you have redness, swelling, pain, drainage, or a bad smell at the port site. This information is not intended to replace advice given to you by your health care provider. Make sure you discuss any questions you have with your healthcare provider. Document Revised: 06/20/2019 Document Reviewed: 06/20/2019 Elsevier Patient Education  2022 [employer] Inc.  

## 2020-08-17 NOTE — Progress Notes (Signed)
Oncology Nurse Navigator Documentation   Met with Alexander [name] to provide PEG education prior to 09/03/20 placement.  Using  PEG teaching device   and Teach Back, provided education for PEG use and care, including: hand hygiene, gravity bolus administration of daily water flushes and nutritional supplement, fluids and medications; care of tube insertion site including daily dressing change and cleaning; S&S of infection.   Alexander [name] correctly verbalized procedures for and provided correct return demonstration of gravity administration of water, dressing change and site care.  I provided written instructions for PEG flushing/dressing change in support of verbal instruction.   I provided/described contents of Start of Care Bolus Feeding Kit (3 60 cc syringes, 2 boxes 4x4 drainage sponges, 1 package mesh briefs, 1 roll paper tape, 1 case Osmolite 1.5).  He voiced understanding he is to start using Osmolite per guidance of Nutrition. He understands I will be available for ongoing PEG support. Provided barium sulfate prep which I obtained from [facility] and reviewed instructions.  [name] RN, BSN, OCN Head & Neck Oncology Nurse [place] at [facility] Phone # [phone]  Fax # [phone]

## 2020-08-18 LAB — T4: T4, Total: 6.7 ug/dL (ref 4.5–12.0)

## 2020-08-21 ENCOUNTER — Inpatient Hospital Stay: Payer: BC Managed Care – PPO

## 2020-08-21 ENCOUNTER — Encounter: Payer: Commercial Managed Care - PPO | Admitting: Nutrition

## 2020-08-21 ENCOUNTER — Other Ambulatory Visit: Payer: Self-pay

## 2020-08-21 ENCOUNTER — Inpatient Hospital Stay: Payer: BC Managed Care – PPO | Admitting: Nutrition

## 2020-08-21 ENCOUNTER — Encounter: Payer: Self-pay | Admitting: Hematology and Oncology

## 2020-08-21 VITALS — BP 109/67 | HR 74 | Temp 98.5°F | Resp 20

## 2020-08-21 DIAGNOSIS — Z5112 Encounter for antineoplastic immunotherapy: Secondary | ICD-10-CM | POA: Diagnosis not present

## 2020-08-21 DIAGNOSIS — C01 Malignant neoplasm of base of tongue: Secondary | ICD-10-CM

## 2020-08-21 MED ORDER — HEPARIN SOD (PORK) LOCK FLUSH 100 UNIT/ML IV SOLN
500.0000 [IU] | Freq: Once | INTRAVENOUS | Status: AC | PRN
  Administered 2020-08-21: 500 [IU]
  Filled 2020-08-21: qty 5

## 2020-08-21 MED ORDER — SODIUM CHLORIDE 0.9% FLUSH
10.0000 mL | INTRAVENOUS | Status: DC | PRN
  Administered 2020-08-21: 10 mL
  Filled 2020-08-21: qty 10

## 2020-08-21 NOTE — Progress Notes (Signed)
Nutrition follow-up completed with patient prior to pump removal.    Patient states he is planning on having both chemotherapy and radiation therapy for tongue cancer.  Weight was documented as 279.1 pounds on July 1 improved from 272 pounds April 26.  Patient's weight does vary a bit up and down.  He reports his appetite has improved and he is eating normally.  Reports he is having some taste alterations and states ice cream does not taste the same.  He is status post swallow evaluation and denies any difficulties at this time.  Reports he cannot stand the thought of having to drink Ensure or boost.  Feeding tube is scheduled for July 18.  Patient confirms recent teaching with head and neck navigator.  Nutrition diagnosis: Food and nutrition related knowledge deficit improving.  Intervention: Enforced importance of continued oral intake throughout treatment for weight maintenance. Congratulated patient on agreeing for feeding tube placement prior to start of treatment.  Assured him we would help him to eat by mouth as long as possible and supplement with tube feedings as needed.  Enforced importance of daily tube feeding care of site as well as flushing tube with water. Brief education provided on taste alterations and suggestions for improving the taste of ice cream.  Monitoring, evaluation, goals: Patient will tolerate increased calories and protein to minimize weight loss throughout treatment.  Next visit: Thursday, July 21 during infusion.  **Disclaimer: This note was dictated with voice recognition software. Similar sounding words can inadvertently be transcribed and this note may contain transcription errors which may not have been corrected upon publication of note.**

## 2020-08-22 ENCOUNTER — Other Ambulatory Visit: Payer: Self-pay | Admitting: Hematology and Oncology

## 2020-08-22 ENCOUNTER — Other Ambulatory Visit: Payer: Self-pay

## 2020-08-22 ENCOUNTER — Encounter: Payer: Self-pay | Admitting: Hematology and Oncology

## 2020-08-22 ENCOUNTER — Ambulatory Visit (INDEPENDENT_AMBULATORY_CARE_PROVIDER_SITE_OTHER): Payer: Commercial Managed Care - PPO | Admitting: Otolaryngology

## 2020-08-22 DIAGNOSIS — C01 Malignant neoplasm of base of tongue: Secondary | ICD-10-CM | POA: Diagnosis not present

## 2020-08-22 DIAGNOSIS — C109 Malignant neoplasm of oropharynx, unspecified: Secondary | ICD-10-CM

## 2020-08-22 NOTE — Progress Notes (Signed)
HPI: Alexander [name] is a 60 y.o. 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  He was presented at tumor board earlier today.  He has completed his induction chemotherapy.  He will be starting radiation therapy in a couple of weeks but still has to be seen by oral surgery.  He has been capping his tracheostomy during the day but wears a Passy-Muir valve at night.  He probably does have some underlying obstructive sleep apnea. His airway today with the Trach capped is excellent with good speech.. Patient states that he lives by himself. Of note patient had emergent tracheostomy performed by myself on 05/09/2020 at which time he was undergoing DL and biopsy and anesthesiology were unable to intubate the patient.  Past Medical History:  Diagnosis Date   Asthma    Cancer ([place])    GERD (gastroesophageal reflux disease)    Sleep apnea    CPAP- does not wear   Past Surgical History:  Procedure Laterality Date   BRONCHIAL NEEDLE ASPIRATION BIOPSY  05/22/2020   Procedure: BRONCHIAL NEEDLE ASPIRATION BIOPSIES;  Surgeon: Garner Nash, DO;  Location: [place];  Service: Pulmonary;;   DIRECT LARYNGOSCOPY  05/09/2020   Procedure: DIRECT LARYNGOSCOPY;  Surgeon: Rozetta Nunnery, MD;  Location: [facility];  Service: ENT;;   IR IMAGING GUIDED PORT INSERTION  06/12/2020   MASS EXCISION N/A 05/09/2020   Procedure: EXCISION TONGUE MASS;  Surgeon: Rozetta Nunnery, MD;  Location: [facility] OR;  Service: ENT;  Laterality: N/A;   TONSILLECTOMY     TRACHEOSTOMY TUBE PLACEMENT  05/09/2020   Procedure: TRACHEOSTOMY;  Surgeon: Rozetta Nunnery, MD;  Location: [place];  Service: ENT;;   VIDEO BRONCHOSCOPY WITH ENDOBRONCHIAL ULTRASOUND N/A 05/22/2020   Procedure: VIDEO BRONCHOSCOPY WITH ENDOBRONCHIAL ULTRASOUND;  Surgeon: Garner Nash, DO;  Location: [place];  Service: Pulmonary;  Laterality: N/A;   Social History   Socioeconomic History   Marital status: Single     Spouse name: Not on file   Number of children: Not on file   Years of education: Not on file   Highest education level: Not on file  Occupational History   Not on file  Tobacco Use   Smoking status: Never   Smokeless tobacco: Never  Vaping Use   Vaping Use: Never used  Substance and Sexual Activity   Alcohol use: Not Currently    Comment: "4-5 drinks a year for special ocassions" 05/08/20   Drug use: Never   Sexual activity: Not Currently  Other Topics Concern   Not on file  Social History Narrative   Not on file   Social Determinants of Health   Financial Resource Strain: Low Risk    Difficulty of Paying Living Expenses: Not hard at all  Food Insecurity: No Food Insecurity   Worried About Charity fundraiser in the Last Year: Never true   [place] in the Last Year: Never true  Transportation Needs: No Transportation Needs   Lack of Transportation (Medical): No   Lack of Transportation (Non-Medical): No  Physical Activity: Not on file  Stress: No Stress Concern Present   Feeling of Stress : Not at all  Social Connections: Socially Isolated   Frequency of Communication with Friends and Family: More than three times a week   Frequency of Social Gatherings with Friends and Family: More than three times a week   Attends Religious Services: Never   Retail buyer of [employer]  or Organizations: No   Attends Archivist Meetings: Never   Marital Status: Separated   Family History  Problem Relation Age of Onset   Breast cancer Mother    Cancer - Cervical Mother    Colon cancer Father    Colon cancer Brother    No Known Allergies Prior to Admission medications   Medication Sig Start Date End Date Taking? Authorizing Provider  albuterol (PROVENTIL) (2.5 MG/3ML) 0.083% nebulizer solution Take 3 mLs (2.5 mg total) by nebulization every 6 (six) hours as needed for wheezing or shortness of breath. 05/17/20   Icard, Octavio Graves, DO  albuterol (VENTOLIN HFA) 108 (90 Base)  MCG/ACT inhaler Inhale 2 puffs into the lungs as needed for wheezing or shortness of breath.    [provider]  bacitracin ointment Apply 1 application topically every 8 (eight) hours. Apply around trach wound 05/12/20   Aline August, MD  cetirizine (ZYRTEC) 10 MG tablet Take 10 mg by mouth daily.    [provider]  fluticasone (FLONASE) 50 MCG/ACT nasal spray Place 2 sprays into both nostrils daily. Place 1 spray each nostril at night 05/25/20   Rozetta Nunnery, MD  Fluticasone-Salmeterol (ADVAIR) 100-50 MCG/DOSE AEPB Inhale 1 puff into the lungs daily.    [provider]  omeprazole (PRILOSEC) 40 MG capsule Take 40 mg by mouth daily.    [provider]  prochlorperazine (COMPAZINE) 10 MG tablet Take 1 tablet (10 mg total) by mouth every 6 (six) hours as needed for nausea or vomiting. 06/11/20 08/22/20  Benay Pike, MD     Positive ROS: Otherwise negative  All other systems have been reviewed and were otherwise negative with the exception of those mentioned in the HPI and as above.  Physical Exam: Constitutional: Alert, well-appearing, no acute distress Ears: External ears without lesions or tenderness. Ear canals are clear bilaterally with intact, clear TMs.  Nasal: External nose without lesions. Septum with minimal deformity and mild rhinitis.. Clear nasal passages otherwise. Oral: Lips and gums without lesions. Tongue and palate mucosa without lesions. Posterior oropharynx clear.  Indirect laryngoscopy revealed easy visualization of the epiglottis and vocal cords but still has mild edema and fullness more so on the left base of tongue than the right. Neck: No palpable adenopathy or masses.  Fullness in the left neck secondary to lymphadenopathy.  Trachea stoma looks good with no swelling or inflammation. Respiratory: Breathing comfortably  Skin: No facial/neck lesions or rash noted.  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  Patient is getting ready to start radiation therapy.  Plan: Reviewed with the patient today concerning eliminating the trach which he would like to have removed.  I do not feel like he will have any major airway problems but I did recommend that he wear The Trach 24/7 for 5 days as presently he is using the Passy-Muir valve at night.  If he is able to sleep well with the trach capped he should have no problem with removing the trach. I would also wait until he sees Dr. Benson Norway concerning any dental procedures that might require sedation prior to removing the trach.  He is scheduled to see her next week. I will make a follow-up appointment with him here in 2-2-1/2 following completion of his radiation therapy.   Radene Journey, MD

## 2020-08-22 NOTE — Progress Notes (Signed)
DISCONTINUE ON PATHWAY REGIMEN - Head and Neck     A cycle is every 21 days:     Carboplatin      Fluorouracil      Pembrolizumab   **Always confirm dose/schedule in your pharmacy ordering system**  REASON: Other Reason PRIOR TREATMENT: [id number]: Pembrolizumab 200 mg D1 + Carboplatin AUC=5 D1 + Fluorouracil 1,000 mg/m2/day CIV D1-4 q21 Days x 6 Cycles, then Pembrolizumab 200 mg q21 Days for up to a total of 24 Months TREATMENT RESPONSE: Partial Response (PR)  START OFF PATHWAY REGIMEN - Other   [id number]:Cisplatin 40 mg/m2 IV D1 q7 Days + RT:   A cycle is every 7 days:     Cisplatin   **Always confirm dose/schedule in your pharmacy ordering system**  Patient Characteristics: Intent of Therapy: Non-Curative / Palliative Intent, Discussed with Patient

## 2020-08-24 ENCOUNTER — Other Ambulatory Visit (HOSPITAL_COMMUNITY): Payer: Commercial Managed Care - PPO | Admitting: Dentistry

## 2020-08-28 ENCOUNTER — Other Ambulatory Visit (HOSPITAL_COMMUNITY): Payer: Commercial Managed Care - PPO | Admitting: Dentistry

## 2020-08-30 ENCOUNTER — Other Ambulatory Visit: Payer: Self-pay

## 2020-08-30 ENCOUNTER — Encounter: Payer: Self-pay | Admitting: Hematology and Oncology

## 2020-08-30 ENCOUNTER — Encounter (HOSPITAL_COMMUNITY): Payer: Self-pay | Admitting: Dentistry

## 2020-08-30 ENCOUNTER — Ambulatory Visit (INDEPENDENT_AMBULATORY_CARE_PROVIDER_SITE_OTHER): Payer: Self-pay | Admitting: Dentistry

## 2020-08-30 ENCOUNTER — Other Ambulatory Visit (HOSPITAL_COMMUNITY): Payer: Commercial Managed Care - PPO

## 2020-08-30 DIAGNOSIS — K036 Deposits [accretions] on teeth: Secondary | ICD-10-CM

## 2020-08-30 DIAGNOSIS — K0850 Unsatisfactory restoration of tooth, unspecified: Secondary | ICD-10-CM

## 2020-08-30 DIAGNOSIS — M264 Malocclusion, unspecified: Secondary | ICD-10-CM

## 2020-08-30 DIAGNOSIS — C029 Malignant neoplasm of tongue, unspecified: Secondary | ICD-10-CM | POA: Diagnosis not present

## 2020-08-30 DIAGNOSIS — M2632 Excessive spacing of fully erupted teeth: Secondary | ICD-10-CM

## 2020-08-30 DIAGNOSIS — K032 Erosion of teeth: Secondary | ICD-10-CM

## 2020-08-30 DIAGNOSIS — Z01818 Encounter for other preprocedural examination: Secondary | ICD-10-CM | POA: Diagnosis not present

## 2020-08-30 DIAGNOSIS — K030 Excessive attrition of teeth: Secondary | ICD-10-CM

## 2020-08-30 DIAGNOSIS — F40232 Fear of other medical care: Secondary | ICD-10-CM

## 2020-08-30 DIAGNOSIS — M270 Developmental disorders of jaws: Secondary | ICD-10-CM

## 2020-08-30 DIAGNOSIS — K08109 Complete loss of teeth, unspecified cause, unspecified class: Secondary | ICD-10-CM

## 2020-08-30 DIAGNOSIS — K029 Dental caries, unspecified: Secondary | ICD-10-CM

## 2020-08-30 DIAGNOSIS — K0510 Chronic gingivitis, plaque induced: Secondary | ICD-10-CM

## 2020-08-30 NOTE — Progress Notes (Signed)
Department of Dental Medicine      OUTPATIENT CONSULT  Service Date:   08/30/2020  Patient Name:   Alexander [name] Date of Birth:   1961/01/08 Medical Record Number: [id number]  Referring Provider:                Eppie [name], M.D.        TODAY'S VISIT:   Assessment:   There are no current signs of acute odontogenic infection including abscess, edema or erythema, or suspicious lesion requiring biopsy.   There are several teeth with cavities and generalized calculus build-up causing inflammation of the gingiva.  Recommendations:   No dental intervention indicated prior to radiation at this time.  Plan:   Discuss case with medical team and coordinate treatment as needed. Return for delivery of scatter protection devices and then follow-up after completion of radiation therapy.  Discussed in detail all treatment options and recommendations with the patient and they are agreeable to the plan.    Thank you for consulting with [facility] and for the opportunity to participate in this patient's treatment.  Should you have any questions or concerns, please contact the [facility] at [phone].        PROGRESS NOTE:   COVID-19 SCREENING:  The patient denies symptoms concerning for COVID-19 infection including fever, chills, cough, or newly developed shortness of breath.   HISTORY OF PRESENT ILLNESS: Alexander [name] is a very pleasant 60 y.o. 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  The patient presents today for a medically necessary dental consultation as part of their pre-radiation therapy work-up.   DENTAL HISTORY: The patient reports that he does not have a dentist he sees regularly.  The last time he saw a dentist was about 2 years ago when he was living in [place] to have a tooth extracted.  He currently denies any dental/orofacial pain or  sensitivity. Patient is able to manage oral secretions.  Patient denies dysphagia, odynophagia, dysphonia, SOB and neck pain.  Patient denies fever, rigors and malaise.   CHIEF COMPLAINT:  Here for a pre-head and neck radiation dental exam.   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   Compromised airway 05/09/2020   Tracheostomy care ([place])    Acute hypoxemic respiratory failure ([place])    Past Medical History:  Diagnosis Date   Asthma    Cancer ([place])    GERD (gastroesophageal reflux disease)    Sleep apnea    CPAP- does not wear   Past Surgical History:  Procedure Laterality Date   BRONCHIAL NEEDLE ASPIRATION BIOPSY  05/22/2020   Procedure: BRONCHIAL NEEDLE ASPIRATION BIOPSIES;  Surgeon: Garner Nash, DO;  Location: [facility];  Service: Pulmonary;;   DIRECT LARYNGOSCOPY  05/09/2020   Procedure: DIRECT LARYNGOSCOPY;  Surgeon: Rozetta Nunnery, MD;  Location: [facility];  Service: ENT;;   IR IMAGING GUIDED PORT INSERTION  06/12/2020   MASS EXCISION N/A 05/09/2020   Procedure: EXCISION TONGUE MASS;  Surgeon: Rozetta Nunnery, MD;  Location: [facility];  Service: ENT;  Laterality: N/A;   TONSILLECTOMY     TRACHEOSTOMY TUBE PLACEMENT  05/09/2020   Procedure: TRACHEOSTOMY;  Surgeon: Rozetta Nunnery, MD;  Location: [facility];  Service: ENT;;   VIDEO BRONCHOSCOPY WITH ENDOBRONCHIAL ULTRASOUND N/A 05/22/2020   Procedure: VIDEO BRONCHOSCOPY  WITH ENDOBRONCHIAL ULTRASOUND;  Surgeon: Garner Nash, DO;  Location: [facility];  Service: Pulmonary;  Laterality: N/A;   No Known Allergies Current Outpatient Medications  Medication Sig Dispense Refill   albuterol (PROVENTIL) (2.5 MG/3ML) 0.083% nebulizer solution Take 3 mLs (2.5 mg total) by nebulization every 6 (six) hours as needed for wheezing or shortness of breath. 120 mL 12   albuterol (VENTOLIN HFA) 108 (90 Base) MCG/ACT inhaler Inhale 2 puffs into the lungs as needed for wheezing or shortness of breath.     bacitracin ointment Apply 1 application topically every 8 (eight) hours. Apply around trach wound 120 g 0   cetirizine (ZYRTEC) 10 MG tablet Take 10 mg by mouth daily.     fluticasone (FLONASE) 50 MCG/ACT nasal spray Place 2 sprays into both nostrils daily. Place 1 spray each nostril at night 16 g 11   Fluticasone-Salmeterol (ADVAIR) 100-50 MCG/DOSE AEPB Inhale 1 puff into the lungs daily.     omeprazole (PRILOSEC) 40 MG capsule Take 40 mg by mouth daily.     No current facility-administered medications for this visit.    LABS: Lab Results  Component Value Date   WBC 3.5 (L) 08/17/2020   HGB 12.3 (L) 08/17/2020   HCT 36.3 (L) 08/17/2020   MCV 92.6 08/17/2020   PLT 99 (L) 08/17/2020      Component Value Date/Time   NA 138 08/17/2020 0845   K 4.0 08/17/2020 0845   CL 104 08/17/2020 0845   CO2 28 08/17/2020 0845   GLUCOSE 96 08/17/2020 0845   BUN 19 08/17/2020 0845   CREATININE 1.17 08/17/2020 0845   CALCIUM 9.4 08/17/2020 0845   GFRNONAA >60 08/17/2020 0845   Lab Results  Component Value Date   INR 1.3 (H) 05/27/2020   No results found for: PTT  Social History   Socioeconomic History   Marital status: Single    Spouse name: Not on file   Number of children: Not on file   Years of education: Not on file   Highest education level: Not on file  Occupational History   Not on file  Tobacco Use   Smoking status: Never   Smokeless tobacco: Never   Vaping Use   Vaping Use: Never used  Substance and Sexual Activity   Alcohol use: Not Currently    Comment: "4-5 drinks a year for special ocassions" 05/08/20   Drug use: Never   Sexual activity: Not Currently  Other Topics Concern   Not on file  Social History Narrative   Not on file   Social Determinants of Health   Financial Resource Strain: Low Risk    Difficulty of Paying Living Expenses: Not hard at all  Food Insecurity: No Food Insecurity   Worried About Charity fundraiser in the Last Year: Never true   Arboriculturist in the Last Year: Never true  Transportation Needs: No Transportation Needs   Lack of Transportation (Medical): No   Lack of Transportation (Non-Medical): No  Physical  Activity: Not on file  Stress: No Stress Concern Present   Feeling of Stress : Not at all  Social Connections: Socially Isolated   Frequency of Communication with Friends and Family: More than three times a week   Frequency of Social Gatherings with Friends and Family: More than three times a week   Attends Religious Services: Never   Marine scientist or Organizations: No   Attends Archivist Meetings: Never   Marital Status: Separated  Intimate Partner Violence: Not on file   Family History  Problem Relation Age of Onset   Breast cancer Mother    Cancer - Cervical Mother    Colon cancer Father    Colon cancer Brother     REVIEW OF SYSTEMS:  Reviewed with the patient as per HPI. Psych:  (+) Dental phobia   VITAL SIGNS: BP 114/73 (BP Location: Right Arm, Patient Position: Sitting, Cuff Size: Normal)   Pulse 80   Temp 99 F (37.2 C) (Oral)    PHYSICAL EXAM: General:  Well-developed, comfortable and in no apparent distress. Neurological:  Alert and oriented to person, place and  time. Extraoral:  Facial symmetry present without any edema or erythema.  No swelling or lymphadenopathy.  TMJ asymptomatic without clicks or crepitations. Maximum Interincisal  Opening:  50 mm Intraoral:  Soft tissues appear well-perfused and mucous membranes moist.  FOM and vestibules soft and not raised. Oral cavity without mass or lesion. No signs of infection, parulis, sinus tract, edema or erythema evident upon exam.  (+) Bilateral mandibular tori.   DENTAL EXAM: Hard tissue exam completed and charted. Overall impression:  Good remaining dentition. Oral hygiene:  Fair    Periodontal:  Pink, healthy gingival tissue with blunted papilla.  Generalized calculus accumulation, localized plaque accumulation.  Gingival recession, localized. Caries:  #16, #17, #20, #21, #29 Defective restorations:  #20 missing existing restoration Endodontics:   #19 has been previously root canal treated with definitive full-coverage crown. Removable/fixed prosthodontics:  #19 has PFM crown Occlusion:  Class III malocclusion.  Non-functional and supra-erupted tooth #1. Other findings:  Attrition/wear: #7-#10 incisal, #23-#26 incisal; Abfraction(s): #5B(V), #12B(V) and #30B(V); Diastema(s): #26#27 and #27#28   RADIOGRAPHIC EXAM:  PAN and Full Mouth Series exposed and interpreted.  Condyles seated bilaterally in fossas.  No evidence of abnormal pathology.  All visualized osseous structures appear WNL. #1 and #2 are drifting towards the mesial.  Localized mild horizontal bone loss consistent with mild periodontitis.  Missing teeth, existing restorations. #19 previously endodontically treated with post in distal root and gutta percha appearing ~1 mm short of apices; #19 has full-coverage restoration.     ASSESSMENT:  1.  SCC of base of tongue 2.  Pre-head and neck radiation dental exam 3.  Missing teeth 4.  Caries 5.  Accretions on teeth 6.  Gingivitis, plaque-induced 7.  Gingival recession, localized 8.  Defective dental restoration 9.  Attrition/wear 10.  Abfraction/flexure 11.  Diastema(s) 12.  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  The discussion included side effects of trismus (limited opening), dysgeusia (loss of taste), xerostomia (dry mouth), radiation caries and osteoradionecrosis of the jaw.  I also discussed the importance of maintaining optimal oral hygiene and oral health before, during and after radiation to decrease the risk of developing radiation cavities and the need for any surgery  such as extractions after therapy.    Upper and Lower alginate impressions taken and poured up in Type IV [employer] for fabrication of scatter protection devices.   Trismus appliance made using patient's baseline MIO (27 sticks).  Leta Speller, DAII demonstrated use of appliance.  Verbal and written postop instructions were given to the patient.   PLAN AND RECOMMENDATIONS: I discussed the risks, benefits, and complications of various scenarios with the patient in relationship to their medical and dental conditions, which included systemic infection or other serious issues such as osteoradionecrosis that could potentially occur either before, during or after their anticipated radiation therapy if dental/oral concerns are not addressed.  I explained that if any chronic or acute dental/oral infection(s) are addressed and subsequently not maintained following medical optimization and recovery, their risk of the previously mentioned complications are just as high and could potentially occur postoperatively.  I explained all significant findings of the dental consultation with the patient including several teeth with smaller cavities and generalized calculus or tartar build-up on teeth causing inflammation of gums, and the recommended care including establishing care with a dentist (either here at the hospital clinic or an outside provider) for routine dental care in order to optimize them  following radiation from a dental standpoint.  The patient verbalized understanding of all findings, discussion, and recommendations. We then discussed various treatment options to include no treatment, multiple extractions with alveoloplasty, pre-prosthetic surgery as indicated, periodontal therapy, dental restorations, root canal therapy, crown and bridge therapy, implant therapy, and replacement of missing teeth as indicated.  The patient verbalized understanding of all options, and currently wishes to proceed with finding a dental office or coming back to our clinic for regular exams, cleanings and other necessary dental treatment following radiation therapy. Plan to discuss all findings and recommendations with medical team and coordinate future care as needed.  The patient will need to establish care at a dental office of his choice or return to our outpatient clinic for routine dental care including replacement of missing teeth as needed, cleanings and exams.   All questions and concerns were invited and addressed.  The patient tolerated today's visit well and departed in stable condition.  I spent in excess of 120 minutes during the conduct of this consultation and >50% of this time involved direct face-to-face encounter for counseling and/or coordination of the patient's care.  Wolverton Benson Norway, D.M.D.

## 2020-08-30 NOTE — Progress Notes (Signed)
Pharmacist Chemotherapy Monitoring - Initial Assessment    Anticipated start date: 09/06/20   The following has been reviewed per standard work regarding the patient's treatment regimen: The patient's diagnosis, treatment plan and drug doses, and organ/hematologic function Lab orders and baseline tests specific to treatment regimen  The treatment plan start date, drug sequencing, and pre-medications Prior authorization status  Patient's documented medication list, including drug-drug interaction screen and prescriptions for anti-emetics and supportive care specific to the treatment regimen The drug concentrations, fluid compatibility, administration routes, and timing of the medications to be used The patient's access for treatment and lifetime cumulative dose history, if applicable  The patient's medication allergies and previous infusion related reactions, if applicable   Changes made to treatment plan:  N/A  Follow up needed:  Pending authorization for treatment    Larene Beach, RPH, 08/30/2020  1:46 PM

## 2020-08-30 NOTE — Patient Instructions (Signed)
[facility] Department of Dental Medicine Shonta Bourque B. Shemia Bevel, D.M.D. Phone: [phone] Fax: [phone]   It was a pleasure seeing you today!  Please refer to the information below regarding your dental visit with us.  Call if you have any questions or concerns that come up after you leave.   Thank you for letting us provide care for you.  If there is anything we can do for you, please let us know.    RADIATION THERAPY AND INFORMATION REGARDING YOUR TEETH   XEROSTOMIA (DRY MOUTH):  Your salivary glands may be in the field of radiation.  Radiation may include all or only part of your salivary glands.  This will cause your saliva to dry up, and you will have a dry mouth.  The dry mouth will be for the rest of your life unless your radiation oncologist tells you otherwise.  Your saliva has many functions: It wets your tongue for speaking. It coats your teeth and the inside of your mouth for easier movement. It helps with chewing and swallowing food. It helps clean away harmful acid and toxic products made by the germs in your mouth, therefore it helps prevent cavities. It kills some germs in your mouth and helps to prevent gum disease. It helps to carry flavor to your taste buds.  Once you have lost your saliva, you will be at higher risk for tooth decay and gum disease.    What can be done to help improve your mouth when there's not enough saliva? Your dentist may give a recommendation for CLoSYS.  It will not bring back all of your saliva but may bring back some of it.  Also, your saliva may be thick and ropy or white and foamy.  It will not feel like it use to feel. You will need to swish with water every time your mouth feels dry.  YOU CANNOT suck on any cough drops, mints, lemon drops, candy, vitamin C or any other products.  You cannot use anything other than water to make your mouth feel less dry.  If you want to drink anything else, you have to drink it all at once and brush  afterwards.  Be sure to discuss the details of your diet habits with your dentist or hygienist.   RADIATION CARIES:  This is decay (cavities) that happens very quickly once your mouth is very dry due to radiation therapy.  Normally, cavities take six months to two years to become a problem.  When you have dry mouth, cavities may take as little as eight weeks to cause you a problem.    Dental check-ups every two months are necessary as long as you have a dry mouth. Radiation caries typically, but not always, start at your gum line where it is hard to see the cavity.  It is therefore also hard to fill these cavities adequately.  This high rate of cavities happens because your mouth no longer has saliva and therefore the acid made by the germs starts the decay process.  Whenever you eat anything the germs in your mouth change the food into acid.  The acid then burns a small hole in your tooth.  This small hole is the beginning of a cavity.  If this is not treated then it will grow bigger and become a cavity.  The way to avoid this hole getting bigger is to use fluoride every evening as prescribed by your dentist following your radiation. NOTE:  You have to make sure   that your teeth are very clean before you use the fluoride.  This fluoride in turn will strengthen your teeth and prepare them for another day of fighting acid. If you develop radiation caries many times, the damage is so large that you will have to have all your teeth removed.  This could be a big problem if some of these teeth are in the field of radiation.  Further details of why this could be a big problem will follow (see Osteoradionecrosis below).   DYSGEUSIA (LOSS OF TASTE):  This happens to varying degrees once you've had radiation therapy to your jaw region.  Many times taste is not completely lost, but becomes limited.  The loss of taste is mostly due to radiation affecting your taste buds.  However, if you have no saliva in your mouth  to carry the flavor to your taste buds, it would be difficult for your taste buds to taste anything.  That is why using water or a prescription for [place] prior to meals and during meal times may help with some of the taste.  Keep in mind that taste generally returns very slowly over the course of several months or several years after radiation therapy.  Don't give up hope.   TRISMUS (LIMITED JAW OPENING):  According to your radiation oncologist, your TMJ or jaw joints are going to be partially or fully in the field of radiation.  This means that over time the muscles that help you open and close your mouth may get stiff.  This will potentially result in your not being able to open your mouth wide enough or as wide as you can open it now.    Let me give you an example of how slowly this happens and how unaware people are of it:   A gentlemen that had radiation therapy two years ago came back to me complaining that bananas are just too large for him to be able to fit them in between his teeth.  He was not able to open wide enough to bite into a banana.  This happens slowly and over a period of time.  What we do to try and prevent this:   Your dentist will probably give you a stack of sticks called a trismus exercise device.  This stack will help remind your muscles and your jaw joints to open up to the same distance every day.  Use these sticks every morning when you wake up, or according to the instructions given by your dentist.    You must use these sticks for at least one to two years after radiation therapy.  The reason for that is because it happens so slowly and keeps going on for about two years after radiation therapy.  Your hospital dentist will help you monitor your mouth opening and make sure that it's not getting smaller after radiation.  TRISMUS EXERCISES: Using the stack of sticks given to you by your dentist, place the stack in your mouth and hold onto the other end for support. Leave  the sticks in your mouth while holding the other end.  Allow 30 seconds for muscle stretching. Rest for a few seconds. Repeat 3-5 times. This exercise is recommended in the mornings and evenings unless otherwise instructed. The exercise should be done for a period of 2 YEARS after the end of radiation. Your maximum jaw opening should be checked regularly at recall dental visits by your general dentist. You should report any changes, soreness, or difficulties encountered   when doing the exercises to your dentist.   OSTEORADIONECROSIS (ORN):  This is a condition where your jaw bone after radiation therapy becomes very dry.  It has very little blood supply to keep it alive.  If you develop a cavity that turns into an abscess or an infection, then the jaw bone does not have enough blood supply to help fight the infection.  At this point it is very likely that the infection could cause the death of your jaw bone.  When you have dead bone it has to be removed.  Therefore, you might end up having to have surgery to remove part of your jaw bone, the part of the jaw bone that has been affected.     Healing is also a problem if you are to have surgery (like a tooth extraction) in the areas where the bone has had radiation therapy.  If you have surgery, you need more blood supply to heal which is not available.  When blood supply and oxygen are not available, there is a chance for the bone to die. Occasionally, ORN happens on its own with no obvious reason, but this is quite rare.  We believe that patients who continue to smoke and/or drink alcohol have a higher chance of having this problem. Once your jaw bone has had radiation therapy, if there are any remaining teeth in that area, it is not recommended to have them pulled unless your dentist or oral surgeon is aware of your history of radiation and believes it is safe.  The risks for ORN either from infection or spontaneously occurring (with no reason) are life  long.   QUESTIONS? Call our office during office hours at [phone].  

## 2020-08-31 ENCOUNTER — Ambulatory Visit (HOSPITAL_COMMUNITY)
Admission: RE | Admit: 2020-08-31 | Discharge: 2020-08-31 | Disposition: A | Discharge status: Home / Self Care | Payer: Commercial Managed Care - PPO | Source: Ambulatory Visit | Attending: Acute Care | Admitting: Acute Care

## 2020-08-31 ENCOUNTER — Encounter: Payer: Self-pay | Admitting: Hematology and Oncology

## 2020-08-31 DIAGNOSIS — Z430 Encounter for attention to tracheostomy: Secondary | ICD-10-CM | POA: Diagnosis not present

## 2020-08-31 DIAGNOSIS — Z930 Tracheostomy status: Secondary | ICD-10-CM | POA: Diagnosis not present

## 2020-08-31 DIAGNOSIS — Z9221 Personal history of antineoplastic chemotherapy: Secondary | ICD-10-CM | POA: Insufficient documentation

## 2020-08-31 NOTE — Progress Notes (Signed)
[facility]   Reason for visit:  Decannulation  HPI:  Alexander [name] is well known to me. 60 year old male w/ SCC involving the tongue. Is s/p chemo with good therapeutic response and is now awaiting to start XRT. Last seen by ENT on 7/6 noting ready for decannulation as long as tolerating capping trial and also not needing dental interventions (see Note 7/14 dr Benson Norway which notes no intervention needed).  ROS  History obtained from the patient General ROS: negative ENT ROS: negative Tolerating capping  Hematological and Lymphatic ROS: negative Respiratory ROS: no cough, shortness of breath, or wheezing Cardiovascular ROS: no chest pain or dyspnea on exertion Gastrointestinal ROS: no abdominal pain, change in bowel habits, or black or bloody stools Musculoskeletal ROS: negative Neurological ROS: no TIA or stroke symptoms Vital signs:  Reviewed: Pulse Ox 90s  Exam:  no distress ENT exam normal, no neck nodes or sinus tenderness and trach site unremarkable. Excellent phonation w/ trach occluded. The prior wound has healed clear to auscultation bilaterally regular rate and rhythm Abdomen soft and nontender without distention, masses , no wound infection noted. extremities normal, atraumatic, no cyanosis or edema Alert and oriented x 3, gait normal., reflexes normal and symmetric, strength and  sensation grossly normal  Trach change/procedure:  decannulation   Wound appearance: unremarkable.  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 Trach removed.  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     Visit time: 32 minutes.   Erick Colace ACNP-BC [place]

## 2020-08-31 NOTE — Progress Notes (Addendum)
Tracheostomy Procedure Note  Amdrew Oboyle [id number] 06/06/1960  Pre Procedure Tracheostomy Information  Covid swab negative/ Done at [facility]  08/30/2020  Lot # [id number]  Exp date 01/21/2021 done by RN    Procedure: Decanulation    Post Procedure Tracheostomy Information Trach removed an bandage applied   Post Procedure Evaluation none    Education: Trach stoma care  Prescription needs: none  Additional needs: Wound bandages

## 2020-09-03 ENCOUNTER — Ambulatory Visit (HOSPITAL_COMMUNITY): Payer: Commercial Managed Care - PPO | Attending: Hematology and Oncology

## 2020-09-03 ENCOUNTER — Other Ambulatory Visit (HOSPITAL_COMMUNITY): Payer: Commercial Managed Care - PPO

## 2020-09-04 ENCOUNTER — Encounter: Payer: Self-pay | Admitting: Hematology and Oncology

## 2020-09-04 ENCOUNTER — Other Ambulatory Visit: Payer: Self-pay | Admitting: Hematology and Oncology

## 2020-09-06 ENCOUNTER — Ambulatory Visit (INDEPENDENT_AMBULATORY_CARE_PROVIDER_SITE_OTHER): Payer: BC Managed Care – PPO | Admitting: Dentistry

## 2020-09-06 ENCOUNTER — Other Ambulatory Visit: Payer: Self-pay

## 2020-09-06 ENCOUNTER — Ambulatory Visit: Payer: Commercial Managed Care - PPO

## 2020-09-06 ENCOUNTER — Other Ambulatory Visit: Payer: Commercial Managed Care - PPO

## 2020-09-06 ENCOUNTER — Encounter (HOSPITAL_COMMUNITY): Payer: Self-pay | Admitting: Dentistry

## 2020-09-06 ENCOUNTER — Encounter: Payer: Commercial Managed Care - PPO | Admitting: Nutrition

## 2020-09-06 ENCOUNTER — Ambulatory Visit: Payer: Commercial Managed Care - PPO | Admitting: Hematology and Oncology

## 2020-09-06 DIAGNOSIS — Z463 Encounter for fitting and adjustment of dental prosthetic device: Secondary | ICD-10-CM

## 2020-09-06 DIAGNOSIS — C01 Malignant neoplasm of base of tongue: Secondary | ICD-10-CM

## 2020-09-06 NOTE — Progress Notes (Signed)
Department of Dental Medicine    DELIVERY: INTRAORAL DEVICE  Service Date:   09/06/2020  Patient Name:   Alexander [name] Date of Birth:   25-Jan-1961 Medical Record Number: [id number]        TODAY'S VISIT:   Procedures:  Delivered upper and lower scatter protection devices.  Plan: Follow-up s/p radiation therapy.       PROGRESS NOTE:   COVID-19 SCREENING:  The patient denies symptoms concerning for COVID-19 infection including fever, chills, cough, or newly developed shortness of breath.   HISTORY OF PRESENT ILLNESS: Alexander [name] presents today for delivery of upper and lower scatter protection devices. Medical and dental history reviewed with the patient.  His simulation appointment is scheduled for tomorrow.   CHIEF COMPLAINT:   Patient with no complaints.  Here for a routine dental appointment.   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  120 mL 12   albuterol (VENTOLIN HFA) 108 (90 Base) MCG/ACT inhaler Inhale 2 puffs into the lungs as needed for wheezing or shortness of breath.     bacitracin ointment Apply 1 application topically every 8 (eight) hours. Apply around trach wound 120 g 0   cetirizine (ZYRTEC) 10 MG tablet Take 10 mg by mouth daily.     fluticasone (FLONASE) 50 MCG/ACT nasal spray Place 2 sprays into both nostrils daily. Place 1 spray each nostril at night 16 g 11   Fluticasone-Salmeterol (ADVAIR) 100-50 MCG/DOSE AEPB Inhale 1 puff into the lungs daily.     omeprazole (PRILOSEC) 40 MG capsule Take 40 mg by mouth daily.     No current facility-administered medications for this visit.   No Known Allergies   VITALS: BP 116/74 (BP Location: Right Arm, Patient Position: Sitting, Cuff Size: Normal)   Pulse 72   Temp 99.1 F (37.3 C) (Oral)    ASSESSMENT:  Patient with anticipated radiation therapy to the head and neck.   PROCEDURES: Delivery of upper and lower scatter protection devices.  Appliances were tried in and adjusted as needed.  Polished. Postoperative instructions were provided in a written and verbal format concerning the use and care of appliances.   PLAN: Return following the completion of radiation therapy for a follow-up appointment. Return to primary dentist or establish care at an outside dental office of his choice for routine dental care including cleanings, exams and replacement of missing teeth as needed. Call if  any questions or concerns arise.  All questions and concerns were invited and addressed.  The patient tolerated today's visit well and departed in stable condition.  [place] Benson [place], D.M.D.

## 2020-09-07 ENCOUNTER — Ambulatory Visit
Admission: RE | Admit: 2020-09-07 | Discharge: 2020-09-07 | Disposition: A | Discharge status: Home / Self Care | Payer: Commercial Managed Care - PPO | Source: Ambulatory Visit | Attending: Radiation Oncology | Admitting: Radiation Oncology

## 2020-09-07 VITALS — BP 103/71 | HR 64 | Temp 96.8°F | Resp 18 | Wt 282.2 lb

## 2020-09-07 DIAGNOSIS — Z510 Encounter for antineoplastic radiation therapy: Secondary | ICD-10-CM | POA: Diagnosis not present

## 2020-09-07 DIAGNOSIS — Z95828 Presence of other vascular implants and grafts: Secondary | ICD-10-CM

## 2020-09-07 DIAGNOSIS — C01 Malignant neoplasm of base of tongue: Secondary | ICD-10-CM | POA: Diagnosis present

## 2020-09-07 DIAGNOSIS — C781 Secondary malignant neoplasm of mediastinum: Secondary | ICD-10-CM | POA: Diagnosis present

## 2020-09-07 MED ORDER — SODIUM CHLORIDE 0.9% FLUSH
10.0000 mL | Freq: Once | INTRAVENOUS | Status: AC
  Administered 2020-09-07: 10 mL via INTRAVENOUS

## 2020-09-07 MED ORDER — HEPARIN SOD (PORK) LOCK FLUSH 100 UNIT/ML IV SOLN
500.0000 [IU] | Freq: Once | INTRAVENOUS | Status: AC
  Administered 2020-09-07: 500 [IU] via INTRAVENOUS

## 2020-09-07 NOTE — Progress Notes (Signed)
Oncology Nurse Navigator Documentation   To provide support, encouragement and care continuity, met with Alexander [name] during his CT SIM.  He tolerated procedure without difficulty, denied questions/concerns.     I encouraged him to call me prior to 09/19/20 [place].   [name] RN, BSN, OCN Head & Neck Oncology Nurse [place] at [facility] Phone # [phone]  Fax # [phone]

## 2020-09-07 NOTE — Progress Notes (Signed)
Has armband been applied?  Yes.    Does patient have an allergy to IV contrast dye?: No.   Has patient ever received premedication for IV contrast dye?: No.   Does patient take metformin?: No.  Date of lab work: August 17, 2020 BUN: 19 CR: 1.17 eGFR: >60  IV site: subclavian right, condition patent and no redness  Has IV site been added to flowsheet?  Yes.    BP 103/71 (BP Location: Left Arm, Patient Position: Sitting)   Pulse 64   Temp (!) 96.8 F (36 C) (Temporal)   Resp 18   Wt 282 lb 4 oz (128 kg)   SpO2 100%   BMI 35.28 kg/m

## 2020-09-12 ENCOUNTER — Other Ambulatory Visit: Payer: Self-pay | Admitting: Radiology

## 2020-09-12 ENCOUNTER — Other Ambulatory Visit: Payer: Self-pay | Admitting: Student

## 2020-09-13 ENCOUNTER — Other Ambulatory Visit: Payer: Self-pay

## 2020-09-13 ENCOUNTER — Ambulatory Visit (HOSPITAL_COMMUNITY): Payer: BC Managed Care – PPO

## 2020-09-13 ENCOUNTER — Encounter (HOSPITAL_COMMUNITY): Payer: Self-pay

## 2020-09-13 ENCOUNTER — Ambulatory Visit (HOSPITAL_COMMUNITY)
Admission: RE | Admit: 2020-09-13 | Discharge: 2020-09-13 | Disposition: A | Discharge status: Home / Self Care | Payer: Commercial Managed Care - PPO | Source: Ambulatory Visit | Attending: Hematology and Oncology | Admitting: Hematology and Oncology

## 2020-09-13 DIAGNOSIS — C01 Malignant neoplasm of base of tongue: Secondary | ICD-10-CM | POA: Diagnosis present

## 2020-09-13 DIAGNOSIS — Z79899 Other long term (current) drug therapy: Secondary | ICD-10-CM | POA: Diagnosis not present

## 2020-09-13 HISTORY — PX: IR GASTROSTOMY TUBE MOD SED: IMG625

## 2020-09-13 LAB — CBC
HCT: 36.4 % — ABNORMAL LOW (ref 39.0–52.0)
Hemoglobin: 12.1 g/dL — ABNORMAL LOW (ref 13.0–17.0)
MCH: 32.5 pg (ref 26.0–34.0)
MCHC: 33.2 g/dL (ref 30.0–36.0)
MCV: 97.8 fL (ref 80.0–100.0)
Platelets: 128 10*3/uL — ABNORMAL LOW (ref 150–400)
RBC: 3.72 MIL/uL — ABNORMAL LOW (ref 4.22–5.81)
RDW: 19.5 % — ABNORMAL HIGH (ref 11.5–15.5)
WBC: 4 10*3/uL (ref 4.0–10.5)
nRBC: 0 % (ref 0.0–0.2)

## 2020-09-13 LAB — PROTIME-INR
INR: 1 (ref 0.8–1.2)
Prothrombin Time: 13 seconds (ref 11.4–15.2)

## 2020-09-13 MED ORDER — SODIUM CHLORIDE 0.9 % IV SOLN
2.0000 g | INTRAVENOUS | Status: AC
Start: 2020-09-13 — End: 2020-09-13

## 2020-09-13 MED ORDER — LIDOCAINE HCL 1 % IJ SOLN
INTRAMUSCULAR | Status: AC
  Filled 2020-09-13: qty 20

## 2020-09-13 MED ORDER — FENTANYL CITRATE (PF) 100 MCG/2ML IJ SOLN
INTRAMUSCULAR | Status: AC
  Filled 2020-09-13: qty 2

## 2020-09-13 MED ORDER — SODIUM CHLORIDE 0.9 % IV SOLN: INTRAVENOUS | Status: DC

## 2020-09-13 MED ORDER — HEPARIN SOD (PORK) LOCK FLUSH 100 UNIT/ML IV SOLN
500.0000 [IU] | Freq: Once | INTRAVENOUS | Status: AC
  Administered 2020-09-13: 500 [IU] via INTRAVENOUS
  Filled 2020-09-13: qty 5

## 2020-09-13 MED ORDER — LIDOCAINE VISCOUS HCL 2 % MT SOLN
OROMUCOSAL | Status: AC
  Filled 2020-09-13: qty 15

## 2020-09-13 MED ORDER — FENTANYL CITRATE (PF) 100 MCG/2ML IJ SOLN
INTRAMUSCULAR | Status: AC | PRN
  Administered 2020-09-13 (×2): 50 ug via INTRAVENOUS

## 2020-09-13 MED ORDER — MIDAZOLAM HCL 2 MG/2ML IJ SOLN
INTRAMUSCULAR | Status: AC | PRN
Start: 2020-09-13 — End: 2020-09-13
  Administered 2020-09-13 (×4): 1 mg via INTRAVENOUS

## 2020-09-13 MED ORDER — LIDOCAINE-EPINEPHRINE 1 %-1:100000 IJ SOLN
INTRAMUSCULAR | Status: AC | PRN
  Administered 2020-09-13: 20 mL

## 2020-09-13 MED ORDER — SODIUM CHLORIDE 0.9 % IV SOLN
INTRAVENOUS | Status: AC
  Administered 2020-09-13: 2 g via INTRAVENOUS
  Filled 2020-09-13: qty 2

## 2020-09-13 MED ORDER — IOHEXOL 300 MG/ML  SOLN
15.0000 mL | Freq: Once | INTRAMUSCULAR | Status: AC | PRN
  Administered 2020-09-13: 15 mL

## 2020-09-13 MED ORDER — MIDAZOLAM HCL 2 MG/2ML IJ SOLN
INTRAMUSCULAR | Status: AC
  Filled 2020-09-13: qty 4

## 2020-09-13 MED ORDER — LIDOCAINE HCL URETHRAL/MUCOSAL 2 % EX GEL
CUTANEOUS | Status: AC | PRN
  Administered 2020-09-13: 1

## 2020-09-13 MED ORDER — GLUCAGON HCL RDNA (DIAGNOSTIC) 1 MG IJ SOLR
INTRAMUSCULAR | Status: AC
  Filled 2020-09-13: qty 1

## 2020-09-13 MED ORDER — GLUCAGON HCL (RDNA) 1 MG IJ SOLR
INTRAMUSCULAR | Status: AC | PRN
  Administered 2020-09-13: 1 mg via INTRAVENOUS

## 2020-09-13 NOTE — H&P (Signed)
Chief Complaint: Patient was seen in consultation today for G-tube at the request of [place]  Referring Physician(s): Benay Pike  Supervising Physician: Mir, Sharen Heck  Patient Status: [facility] - Out-pt  History of Present Illness: Alexander [name] is a 60 y.o. male with metastatic tongue cancer. Has been receiving chemotherapy via (R)IJ port and tolerating well. Has previous tracheostomy that has been removed and he's healed up well. Currently able to eat and drink without much difficulty He's going to start radiation soon and there is high risk/expectations for dysphagia and the need for nutritional support. IR is asked to place percutaneous gastrostomy. PMHx, meds, labs, imaging, allergies reviewed. Feels well, no recent fevers, chills, illness. Has been NPO today as directed.   Past Medical History:  Diagnosis Date   Asthma    Cancer ([place])    GERD (gastroesophageal reflux disease)    Sleep apnea    CPAP- does not wear    Past Surgical History:  Procedure Laterality Date   BRONCHIAL NEEDLE ASPIRATION BIOPSY  05/22/2020   Procedure: BRONCHIAL NEEDLE ASPIRATION BIOPSIES;  Surgeon: Garner Nash, DO;  Location: [facility];  Service: Pulmonary;;   DIRECT LARYNGOSCOPY  05/09/2020   Procedure: DIRECT LARYNGOSCOPY;  Surgeon: Rozetta Nunnery, MD;  Location: [facility] OR;  Service: ENT;;   IR IMAGING GUIDED PORT INSERTION  06/12/2020   MASS EXCISION N/A 05/09/2020   Procedure: EXCISION TONGUE MASS;  Surgeon: Rozetta Nunnery, MD;  Location: [facility] OR;  Service: ENT;  Laterality: N/A;   TONSILLECTOMY     TRACHEOSTOMY TUBE PLACEMENT  05/09/2020   Procedure: TRACHEOSTOMY;  Surgeon: Rozetta Nunnery, MD;  Location: [facility];  Service: ENT;;   VIDEO BRONCHOSCOPY WITH ENDOBRONCHIAL ULTRASOUND N/A 05/22/2020   Procedure: VIDEO BRONCHOSCOPY WITH ENDOBRONCHIAL ULTRASOUND;  Surgeon: Garner Nash, DO;  Location: [facility];  Service: Pulmonary;  Laterality: N/A;     Allergies: Patient has no known allergies.  Medications: Prior to Admission medications   Medication Sig Start Date End Date Taking? Authorizing Provider  albuterol (PROVENTIL) (2.5 MG/3ML) 0.083% nebulizer solution Take 3 mLs (2.5 mg total) by nebulization every 6 (six) hours as needed for wheezing or shortness of breath. 05/17/20   Icard, Octavio Graves, DO  albuterol (VENTOLIN HFA) 108 (90 Base) MCG/ACT inhaler Inhale 2 puffs into the lungs as needed for wheezing or shortness of breath.    [provider]  bacitracin ointment Apply 1 application topically every 8 (eight) hours. Apply around trach wound 05/12/20   Aline August, MD  cetirizine (ZYRTEC) 10 MG tablet Take 10 mg by mouth daily.    [provider]  fluticasone (FLONASE) 50 MCG/ACT nasal spray Place 2 sprays into both nostrils daily. Place 1 spray each nostril at night 05/25/20   Rozetta Nunnery, MD  Fluticasone-Salmeterol (ADVAIR) 100-50 MCG/DOSE AEPB Inhale 1 puff into the lungs daily.    [provider]  omeprazole (PRILOSEC) 40 MG capsule Take 40 mg by mouth daily.    [provider]  prochlorperazine (COMPAZINE) 10 MG tablet Take 1 tablet (10 mg total) by mouth every 6 (six) hours as needed for nausea or vomiting. 06/11/20 08/22/20  Benay Pike, MD     Family History  Problem Relation Age of Onset   Breast cancer Mother    Cancer - Cervical Mother    Colon cancer Father    Colon cancer Brother     Social History   Socioeconomic History   Marital status: Single    Spouse name:  Not on file   Number of children: Not on file   Years of education: Not on file   Highest education level: Not on file  Occupational History   Not on file  Tobacco Use   Smoking status: Never   Smokeless tobacco: Never  Vaping Use   Vaping Use: Never used  Substance and Sexual Activity   Alcohol use: Not Currently    Comment: "4-5 drinks a year for special ocassions" 05/08/20   Drug use: Never    Sexual activity: Not Currently  Other Topics Concern   Not on file  Social History Narrative   Not on file   Social Determinants of Health   Financial Resource Strain: Low Risk    Difficulty of Paying Living Expenses: Not hard at all  Food Insecurity: No Food Insecurity   Worried About Charity fundraiser in the Last Year: Never true   [place] in the Last Year: Never true  Transportation Needs: No Transportation Needs   Lack of Transportation (Medical): No   Lack of Transportation (Non-Medical): No  Physical Activity: Not on file  Stress: No Stress Concern Present   Feeling of Stress : Not at all  Social Connections: Socially Isolated   Frequency of Communication with Friends and Family: More than three times a week   Frequency of Social Gatherings with Friends and Family: More than three times a week   Attends Religious Services: Never   Marine scientist or Organizations: No   Attends Archivist Meetings: Never   Marital Status: Separated     Review of Systems: A 12 point ROS discussed and pertinent positives are indicated in the HPI above.  All other systems are negative.  Review of Systems  Vital Signs: BP 125/73   Pulse 66   Resp 12   SpO2 100%   Physical Exam Constitutional:      Appearance: Normal appearance. He is not ill-appearing.  HENT:     Mouth/Throat:     Mouth: Mucous membranes are moist.     Pharynx: Oropharynx is clear.  Neck:     Comments: Tracheostomy site healing well, no drainage. Cardiovascular:     Rate and Rhythm: Normal rate and regular rhythm.     Heart sounds: Normal heart sounds.  Pulmonary:     Effort: Pulmonary effort is normal. No respiratory distress.     Breath sounds: Normal breath sounds.  Abdominal:     General: Abdomen is flat. There is no distension.     Palpations: Abdomen is soft.  Skin:    General: Skin is warm and dry.  Neurological:     General: No focal deficit present.     Mental  Status: He is alert and oriented to person, place, and time.    Imaging: No results found.  Labs:  CBC: Recent Labs    05/30/20 0328 07/05/20 1359 07/26/20 1325 08/17/20 0845  WBC 11.1* 4.0 3.9* 3.5*  HGB 12.2* 12.1* 12.5* 12.3*  HCT 35.9* 35.8* 37.4* 36.3*  PLT 185 227 131* 99*    COAGS: Recent Labs    05/27/20 2235  INR 1.3*  APTT 35    BMP: Recent Labs    06/12/20 1220 07/05/20 1359 07/26/20 1325 08/17/20 0845  NA 141 141 140 138  K 3.6 3.6 4.0 4.0  CL 104 105 104 104  CO2 '28 26 29 28  '$ GLUCOSE 99 87 111* 96  BUN '7 9 10 19  '$ CALCIUM  9.2 9.4 9.7 9.4  CREATININE 0.86 0.96 1.07 1.17  GFRNONAA >60 >60 >60 >60    LIVER FUNCTION TESTS: Recent Labs    06/12/20 1220 07/05/20 1359 07/26/20 1325 08/17/20 0845  BILITOT 0.6 0.6 0.7 0.7  AST 33 24 13* 19  ALT 62* 32 12 18  ALKPHOS 60 63 58 58  PROT 7.0 6.7 6.9 7.3  ALBUMIN 3.2* 3.3* 3.5 3.9    TUMOR MARKERS: No results for input(s): AFPTM, CEA, CA199, CHROMGRNA in the last 8760 hours.  Assessment and Plan: Metastatic tongue cancer High risk for dysphagia and thus PCM with upcoming radiation Plan for perc gastrostomy tube. Labs pending Risks and benefits image guided gastrostomy tube placement was discussed with the patient including, but not limited to the need for a barium enema during the procedure, bleeding, infection, peritonitis and/or damage to adjacent structures.  All of the patient's questions were answered, patient is agreeable to proceed.  Consent signed and in chart.   Thank you for this interesting consult.  I greatly enjoyed meeting Shivang Mcright and look forward to participating in their care.  A copy of this report was sent to the requesting provider on this date.  Electronically Signed: Ascencion Dike, PA-C 09/13/2020, 8:43 AM   I spent a total of 20 minutes in face to face in clinical consultation, greater than 50% of which was counseling/coordinating care for perc G-tube

## 2020-09-13 NOTE — Discharge Instructions (Addendum)
Urgent needs - Interventional Radiology on call MD [phone]  Wound -  Keep site clean and dry.  Do not submerge in tub or water.  Site Care as instructed by nurse navigator.  Start with clear liquids gradually today and progress to full diet as tolerated and directed by your provider.  Notify your provider if you are unable to eat and drink normally by tomorrow.

## 2020-09-13 NOTE — Procedures (Signed)
Interventional Radiology Procedure Note  Procedure: G tube insertion  Indication: Tongue malignancy  Findings: Please refer to procedural dictation for full description.  Complications: None  EBL: < 10 mL  Miachel Roux, MD [phone]

## 2020-09-14 ENCOUNTER — Other Ambulatory Visit (HOSPITAL_COMMUNITY): Payer: Self-pay

## 2020-09-14 ENCOUNTER — Other Ambulatory Visit: Payer: Self-pay | Admitting: Hematology and Oncology

## 2020-09-14 ENCOUNTER — Encounter: Payer: Self-pay | Admitting: Hematology and Oncology

## 2020-09-14 DIAGNOSIS — C01 Malignant neoplasm of base of tongue: Secondary | ICD-10-CM

## 2020-09-14 DIAGNOSIS — Z510 Encounter for antineoplastic radiation therapy: Secondary | ICD-10-CM | POA: Diagnosis not present

## 2020-09-14 MED ORDER — LIDOCAINE-PRILOCAINE 2.5-2.5 % EX CREA
TOPICAL_CREAM | CUTANEOUS | 3 refills | Status: DC
  Filled 2020-09-14: qty 30, 15d supply, fill #0
  Filled 2020-09-14: qty 30, 30d supply, fill #0

## 2020-09-17 ENCOUNTER — Encounter: Payer: Self-pay | Admitting: Hematology and Oncology

## 2020-09-18 ENCOUNTER — Encounter: Payer: Self-pay | Admitting: Hematology and Oncology

## 2020-09-18 ENCOUNTER — Other Ambulatory Visit (HOSPITAL_COMMUNITY): Payer: Self-pay

## 2020-09-18 ENCOUNTER — Inpatient Hospital Stay (HOSPITAL_BASED_OUTPATIENT_CLINIC_OR_DEPARTMENT_OTHER): Payer: PRIVATE HEALTH INSURANCE | Admitting: Hematology and Oncology

## 2020-09-18 ENCOUNTER — Telehealth: Payer: Self-pay

## 2020-09-18 ENCOUNTER — Inpatient Hospital Stay: Payer: PRIVATE HEALTH INSURANCE

## 2020-09-18 ENCOUNTER — Other Ambulatory Visit: Payer: Self-pay

## 2020-09-18 VITALS — BP 110/75 | HR 69 | Temp 98.4°F | Resp 18 | Ht 75.0 in | Wt 279.6 lb

## 2020-09-18 DIAGNOSIS — Z5111 Encounter for antineoplastic chemotherapy: Secondary | ICD-10-CM | POA: Insufficient documentation

## 2020-09-18 DIAGNOSIS — Z95828 Presence of other vascular implants and grafts: Secondary | ICD-10-CM

## 2020-09-18 DIAGNOSIS — C01 Malignant neoplasm of base of tongue: Secondary | ICD-10-CM | POA: Insufficient documentation

## 2020-09-18 DIAGNOSIS — R066 Hiccough: Secondary | ICD-10-CM | POA: Insufficient documentation

## 2020-09-18 DIAGNOSIS — R530 Neoplastic (malignant) related fatigue: Secondary | ICD-10-CM | POA: Insufficient documentation

## 2020-09-18 DIAGNOSIS — Z803 Family history of malignant neoplasm of breast: Secondary | ICD-10-CM | POA: Insufficient documentation

## 2020-09-18 DIAGNOSIS — Z931 Gastrostomy status: Secondary | ICD-10-CM | POA: Insufficient documentation

## 2020-09-18 DIAGNOSIS — Z510 Encounter for antineoplastic radiation therapy: Secondary | ICD-10-CM | POA: Diagnosis not present

## 2020-09-18 DIAGNOSIS — C109 Malignant neoplasm of oropharynx, unspecified: Secondary | ICD-10-CM | POA: Diagnosis not present

## 2020-09-18 DIAGNOSIS — R634 Abnormal weight loss: Secondary | ICD-10-CM | POA: Insufficient documentation

## 2020-09-18 DIAGNOSIS — D6959 Other secondary thrombocytopenia: Secondary | ICD-10-CM | POA: Insufficient documentation

## 2020-09-18 DIAGNOSIS — Z808 Family history of malignant neoplasm of other organs or systems: Secondary | ICD-10-CM | POA: Insufficient documentation

## 2020-09-18 DIAGNOSIS — C781 Secondary malignant neoplasm of mediastinum: Secondary | ICD-10-CM | POA: Diagnosis not present

## 2020-09-18 LAB — CBC WITH DIFFERENTIAL/PLATELET
Abs Immature Granulocytes: 0.01 10*3/uL (ref 0.00–0.07)
Basophils Absolute: 0 10*3/uL (ref 0.0–0.1)
Basophils Relative: 1 %
Eosinophils Absolute: 0.1 10*3/uL (ref 0.0–0.5)
Eosinophils Relative: 2 %
HCT: 35.4 % — ABNORMAL LOW (ref 39.0–52.0)
Hemoglobin: 12.3 g/dL — ABNORMAL LOW (ref 13.0–17.0)
Immature Granulocytes: 0 %
Lymphocytes Relative: 25 %
Lymphs Abs: 1.1 10*3/uL (ref 0.7–4.0)
MCH: 33.3 pg (ref 26.0–34.0)
MCHC: 34.7 g/dL (ref 30.0–36.0)
MCV: 95.9 fL (ref 80.0–100.0)
Monocytes Absolute: 0.4 10*3/uL (ref 0.1–1.0)
Monocytes Relative: 9 %
Neutro Abs: 2.9 10*3/uL (ref 1.7–7.7)
Neutrophils Relative %: 63 %
Platelets: 205 10*3/uL (ref 150–400)
RBC: 3.69 MIL/uL — ABNORMAL LOW (ref 4.22–5.81)
RDW: 18.5 % — ABNORMAL HIGH (ref 11.5–15.5)
WBC: 4.5 10*3/uL (ref 4.0–10.5)
nRBC: 0 % (ref 0.0–0.2)

## 2020-09-18 MED ORDER — LIDOCAINE-PRILOCAINE 2.5-2.5 % EX CREA: TOPICAL_CREAM | CUTANEOUS | 3 refills | Status: DC

## 2020-09-18 MED ORDER — PROCHLORPERAZINE MALEATE 10 MG PO TABS: 10.0000 mg | ORAL_TABLET | Freq: Four times a day (QID) | ORAL | 1 refills | Status: DC | PRN

## 2020-09-18 MED ORDER — SODIUM CHLORIDE 0.9% FLUSH
10.0000 mL | Freq: Once | INTRAVENOUS | Status: AC
  Administered 2020-09-18: 10 mL via INTRAVENOUS
  Filled 2020-09-18: qty 10

## 2020-09-18 MED ORDER — DEXAMETHASONE 4 MG PO TABS: 8.0000 mg | ORAL_TABLET | Freq: Every day | ORAL | 1 refills | Status: DC

## 2020-09-18 MED ORDER — ONDANSETRON HCL 8 MG PO TABS: 8.0000 mg | ORAL_TABLET | Freq: Two times a day (BID) | ORAL | 1 refills | Status: DC | PRN

## 2020-09-18 MED ORDER — HEPARIN SOD (PORK) LOCK FLUSH 100 UNIT/ML IV SOLN
500.0000 [IU] | Freq: Once | INTRAVENOUS | Status: AC
  Administered 2020-09-18: 500 [IU] via INTRAVENOUS
  Filled 2020-09-18: qty 5

## 2020-09-18 NOTE — Progress Notes (Signed)
[name] FOLLOW UP NOTE  Patient Care Team: Pcp, No as PCP - General [place], Stephani Police, RN as Oncology Nurse Navigator Benay Pike, MD as Consulting Physician (Hematology and Oncology) Rozetta Nunnery, MD as Consulting Physician (Otolaryngology) Eppie Gibson, MD as Consulting Physician (Radiation Oncology)   CHIEF COMPLAINTS/PURPOSE OF CONSULTATION:  Pre chemo visit for concurrent cisplatin/radiation.  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  He completed 4 cycles of 5-FU/carbo/Keytruda for induction.  He had remarkable response and no toxicity to report He was discussed in the head and neck tumor board and the plan was to consider concurrent chemoradiation to the neck and radiation to the mediastinum given oligometastatic disease.  He clearly understands this is not necessarily curative in intent however may give him prolonged progression free interval.  He would like to be very aggressive since he is young and has great underlying performance status. Plan to start chemoradiation tomorrow.  CBC today appears satisfactory.  Metabolic panel was not drawn and this should be drawn tomorrow. He will continue follow-up with [place] every week.  Orders Placed This Encounter  Procedures   CBC with Differential ([place] Only)    Standing Status:   Standing    Number of Occurrences:   20    Standing Expiration Date:   123XX123   Basic Metabolic Panel - [place] Only    Standing Status:   Standing    Number of Occurrences:   20     Standing Expiration Date:   09/18/2021   Magnesium    Standing Status:   Standing    Number of Occurrences:   20    Standing Expiration Date:   09/18/2021   PHYSICIAN COMMUNICATION ORDER    A baseline Audiogram is recommended prior to initiation of cisplatin chemotherapy.      HISTORY OF PRESENTING ILLNESS:  Alexander [name] 60 y.o. 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[place])  05/17/2020 Initial Diagnosis   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[place])    05/18/2020 Cancer Staging   Staging form: Pharynx - HPV-Mediated Oropharynx, AJCC 8th Edition - Clinical stage from 05/18/2020: cT4, cN3, p16+ - Signed by Eppie Gibson, MD on 05/22/2020    06/01/2020 Cancer Staging   Staging form: Pharynx - HPV-Mediated Oropharynx, AJCC 8th Edition - Pathologic stage from 06/01/2020: No Stage Recommended (cT4, cN3, cM1, p16+) - Signed by Benay Pike, MD on 06/01/2020  Stage prefix: Initial diagnosis    06/12/2020 - 08/21/2020 Chemotherapy          09/10/2020 - 09/10/2020 Chemotherapy          09/19/2020 -  Chemotherapy    Patient is on Treatment Plan: HEAD/NECK CISPLATIN Q7D        INTERIM HISTORY  Alexander [name] is here for a follow up by himself. He received 4 cycles of carboplatin/5-FU and Keytruda for induction.  He is now being considered for concurrent chemoradiation given oligometastatic disease in the mediastinum along with the disease in the neck.  He tolerated chemo very well.  He is currently doing very well, had his trach removed.  He is able to eat well, denies any complaints today.  He has his G-tube placed which she is not very happy about however he understands the importance of nourishment during the treatment. Rest of the pertinent 10 point ROS reviewed and negative.   MEDICAL HISTORY:  Past Medical History:  Diagnosis Date   Asthma    Cancer ([place])    GERD (gastroesophageal reflux disease)    Sleep apnea     CPAP- does not wear    SURGICAL HISTORY: Past Surgical History:  Procedure Laterality Date   BRONCHIAL NEEDLE ASPIRATION BIOPSY  05/22/2020   Procedure: BRONCHIAL NEEDLE ASPIRATION BIOPSIES;  Surgeon: Garner Nash, DO;  Location: [facility];  Service: Pulmonary;;   DIRECT LARYNGOSCOPY  05/09/2020   Procedure: DIRECT LARYNGOSCOPY;  Surgeon: Rozetta Nunnery, MD;  Location: [facility];  Service: ENT;;   IR GASTROSTOMY TUBE MOD SED  09/13/2020   IR IMAGING GUIDED PORT INSERTION  06/12/2020   MASS EXCISION N/A 05/09/2020   Procedure: EXCISION TONGUE MASS;  Surgeon: Rozetta Nunnery, MD;  Location: [facility];  Service: ENT;  Laterality: N/A;   TONSILLECTOMY     TRACHEOSTOMY TUBE PLACEMENT  05/09/2020   Procedure: TRACHEOSTOMY;  Surgeon: Rozetta Nunnery, MD;  Location: [facility];  Service: ENT;;   VIDEO BRONCHOSCOPY WITH ENDOBRONCHIAL ULTRASOUND N/A 05/22/2020   Procedure: VIDEO BRONCHOSCOPY WITH ENDOBRONCHIAL ULTRASOUND;  Surgeon: Garner Nash, DO;  Location: [facility];  Service: Pulmonary;  Laterality: N/A;    SOCIAL HISTORY: Social History   Socioeconomic History   Marital status: Single    Spouse name: Not on file   Number of children: Not on file   Years of education: Not on file   Highest education level: Not on file  Occupational History   Not on file  Tobacco Use   Smoking status: Never   Smokeless tobacco: Never  Vaping Use   Vaping Use: Never used  Substance and Sexual Activity   Alcohol use: Not Currently    Comment: "4-5 drinks a year for special ocassions" 05/08/20   Drug use: Never   Sexual activity: Not Currently  Other Topics Concern   Not on file  Social History Narrative   Not on file   Social Determinants of Health   Financial Resource Strain: Low Risk    Difficulty of Paying Living Expenses: Not hard at all  Food Insecurity: No Food Insecurity   Worried About Charity fundraiser in the Last Year: Never true   [place] in the Last Year:  Never true  Transportation Needs: No Transportation Needs   Lack of Transportation (Medical): No   Lack of Transportation (Non-Medical): No  Physical Activity: Not on file  Stress: No Stress Concern Present   Feeling of Stress : Not at all  Social Connections: Socially Isolated   Frequency of Communication with Friends and Family: More than three times a week   Frequency of Social Gatherings with Friends and Family: More than three times a week   Attends Religious Services: Never   Marine scientist or Organizations: No   Attends Archivist Meetings: Never   Marital Status: Separated  Intimate Partner Violence: Not on file    FAMILY HISTORY: Family History  Problem Relation Age of Onset   Breast cancer Mother    Cancer - Cervical Mother    Colon cancer Father    Colon  cancer Brother     ALLERGIES:  has No Known Allergies.  MEDICATIONS:  Current Outpatient Medications  Medication Sig Dispense Refill   albuterol (PROVENTIL) (2.5 MG/3ML) 0.083% nebulizer solution Take 3 mLs (2.5 mg total) by nebulization every 6 (six) hours as needed for wheezing or shortness of breath. 120 mL 12   albuterol (VENTOLIN HFA) 108 (90 Base) MCG/ACT inhaler Inhale 2 puffs into the lungs as needed for wheezing or shortness of breath.     bacitracin ointment Apply 1 application topically every 8 (eight) hours. Apply around trach wound 120 g 0   cetirizine (ZYRTEC) 10 MG tablet Take 10 mg by mouth daily.     fluticasone (FLONASE) 50 MCG/ACT nasal spray Place 2 sprays into both nostrils daily. Place 1 spray each nostril at night 16 g 11   Fluticasone-Salmeterol (ADVAIR) 100-50 MCG/DOSE AEPB Inhale 1 puff into the lungs daily.     lidocaine-prilocaine (EMLA) cream Apply to affected area once as directed 30 g 3   omeprazole (PRILOSEC) 40 MG capsule Take 40 mg by mouth daily.     dexamethasone (DECADRON) 4 MG tablet Take 2 tablets (8 mg total) by mouth daily. Take daily x 3 days starting the  day after cisplatin chemotherapy. Take with food. 30 tablet 1   lidocaine-prilocaine (EMLA) cream Apply to affected area once 30 g 3   ondansetron (ZOFRAN) 8 MG tablet Take 1 tablet (8 mg total) by mouth 2 (two) times daily as needed. Start on the third day after cisplatin chemotherapy. 30 tablet 1   prochlorperazine (COMPAZINE) 10 MG tablet Take 1 tablet (10 mg total) by mouth every 6 (six) hours as needed (Nausea or vomiting). 30 tablet 1   No current facility-administered medications for this visit.    PHYSICAL EXAMINATION: ECOG PERFORMANCE STATUS: 0 - Asymptomatic  Vitals:   09/18/20 0920  BP: 110/75  Pulse: 69  Resp: 18  Temp: 98.4 F (36.9 C)  SpO2: 100%   Filed Weights   09/18/20 0920  Weight: 279 lb 9.6 oz (126.8 kg)   Physical Exam Constitutional:      Appearance: Normal appearance. He is obese. He is not ill-appearing.  HENT:     Head: Normocephalic and atraumatic.     Mouth/Throat:     Mouth: Mucous membranes are moist.     Pharynx: Oropharynx is clear.  Cardiovascular:     Rate and Rhythm: Normal rate and regular rhythm.     Pulses: Normal pulses.     Heart sounds: Normal heart sounds.  Pulmonary:     Effort: Pulmonary effort is normal.     Breath sounds: Normal breath sounds.  Abdominal:     General: Abdomen is flat. Bowel sounds are normal.     Palpations: Abdomen is soft.  Musculoskeletal:        General: No swelling. Normal range of motion.     Cervical back: Normal range of motion.  Lymphadenopathy:     Cervical: Cervical adenopathy (Significantly improved, left-sided cervical lymph node measuring about 2 cm on exam today.[name] removed) present.  Skin:    General: Skin is warm and dry.  Neurological:     General: No focal deficit present.     Mental Status: He is alert.  Psychiatric:        Mood and Affect: Mood normal.        Behavior: Behavior normal.   LABORATORY DATA:  I have reviewed the data as listed Lab Results  Component Value  Date   WBC 4.5 09/18/2020   HGB 12.3 (L) 09/18/2020   HCT 35.4 (L) 09/18/2020   MCV 95.9 09/18/2020   PLT 205 09/18/2020     Chemistry      Component Value Date/Time   NA 138 08/17/2020 0845   K 4.0 08/17/2020 0845   CL 104 08/17/2020 0845   CO2 28 08/17/2020 0845   BUN 19 08/17/2020 0845   CREATININE 1.17 08/17/2020 0845      Component Value Date/Time   CALCIUM 9.4 08/17/2020 0845   ALKPHOS 58 08/17/2020 0845   AST 19 08/17/2020 0845   ALT 18 08/17/2020 0845   BILITOT 0.7 08/17/2020 0845     Labs reviewed, satisfactory to proceed.  RADIOGRAPHIC STUDIES: I have personally reviewed the radiological images as listed and agreed with the findings in the report. IR Gastrostomy Tube  Result Date: 09/13/2020 INDICATION: Tongue malignancy.  Planned radiation. EXAM: Fluoroscopy guided gastrostomy tube placement. MEDICATIONS: Ancef 2 g IV; Antibiotics were administered within 1 hour of the procedure. Glucagon 1 mg IV ANESTHESIA/SEDATION: Versed 4 mg IV; Fentanyl 100 mcg IV Moderate Sedation Time:  16 minutes The patient was continuously monitored during the procedure by the interventional radiology nurse under my direct supervision. CONTRAST:  15 mL of Omnipaque 300-administered into the gastric lumen. FLUOROSCOPY TIME:  Fluoroscopy Time: 2 minutes 54 seconds (92 mGy). COMPLICATIONS: None immediate. PROCEDURE: Informed written consent was obtained from the patient after a thorough discussion of the procedural risks, benefits and alternatives. All questions were addressed. Maximal Sterile Barrier Technique was utilized including caps, mask, sterile gowns, sterile gloves, sterile drape, hand hygiene and skin antiseptic. A timeout was performed prior to the initiation of the procedure. An orogastric tube was placed with fluoroscopic guidance. The anterior abdomen was prepped and draped in sterile fashion. Ultrasound evaluation of the left upper quadrant was performed to confirm the position of the  liver. The skin and subcutaneous tissues were anesthetized with 1% lidocaine. Single gastropexy was placed using fluoroscopic guidance. 17 gauge needle was directed into the distended stomach with fluoroscopic guidance. A wire was advanced into the stomach. 9-French vascular sheath was placed and the orogastric tube was snared using a Goos[place]ck snare device. The orogastric tube and snare were pulled out of the patient's mouth. The snare device was connected to a 20-French gastrostomy tube. The snare device and gastrostomy tube were pulled through the patient's mouth and out the anterior abdominal wall. The gastrostomy tube was cut to an appropriate length. The gastropexy was cut just beneath the button. Contrast injection through gastrostomy tube confirmed placement within the stomach. Fluoroscopic images were obtained for documentation. The gastrostomy tube was flushed with normal saline. IMPRESSION: Pull-through percutaneous gastrostomy tube (20 [place]) placed utilizing fluoroscopic guidance. Electronically Signed   By: Miachel Roux M.D.   On: 09/13/2020 16:13     All questions were answered. The patient knows to call the clinic with any problems, questions or concerns. I have spent 30 minutes in the care of this patient including discussion of current plan, adverse effects of cisplatin Importance of maintaining nourishment with G-tube and follow-up recommendations.   Benay Pike, MD 09/18/2020 12:35 PM

## 2020-09-18 NOTE — Assessment & Plan Note (Signed)
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  He completed 4 cycles of 5-FU/carbo/Keytruda for induction.  He had remarkable response and no toxicity to report He was discussed in the head and neck tumor board and the plan was to consider concurrent chemoradiation to the neck and radiation to the mediastinum given oligometastatic disease.  He clearly understands this is not necessarily curative in intent however may give him prolonged progression free interval.  He would like to be very aggressive since he is young and has great underlying performance status. Plan to start chemoradiation tomorrow.  CBC today appears satisfactory.  Metabolic panel was not drawn and this should be drawn tomorrow. He will continue follow-up with [place] every week.

## 2020-09-18 NOTE — Telephone Encounter (Signed)
Patient was seen here today for labs and MD visit. Only a CBC was taken on the patient and Dr. Chryl Heck intended on having a CMP drawn as well. Patient will be here tomorrow for labs and treatment. I have spoken with Marcell Anger, LPN in the Flush room to ensure that the CMP gets drawn on the patient tomorrow. Amber verbalized understanding and states that she will communicate this to the other nurses in flush so that this doesn't get missed.

## 2020-09-18 NOTE — Progress Notes (Signed)
CMP for 09/19/20

## 2020-09-19 ENCOUNTER — Inpatient Hospital Stay: Payer: PRIVATE HEALTH INSURANCE | Admitting: Dietician

## 2020-09-19 ENCOUNTER — Ambulatory Visit
Admission: RE | Admit: 2020-09-19 | Discharge: 2020-09-19 | Disposition: A | Discharge status: Home / Self Care | Payer: PRIVATE HEALTH INSURANCE | Source: Ambulatory Visit | Attending: Radiation Oncology | Admitting: Radiation Oncology

## 2020-09-19 ENCOUNTER — Inpatient Hospital Stay: Payer: PRIVATE HEALTH INSURANCE

## 2020-09-19 VITALS — BP 98/70 | HR 60 | Temp 98.0°F | Resp 18

## 2020-09-19 DIAGNOSIS — C01 Malignant neoplasm of base of tongue: Secondary | ICD-10-CM

## 2020-09-19 DIAGNOSIS — Z808 Family history of malignant neoplasm of other organs or systems: Secondary | ICD-10-CM | POA: Insufficient documentation

## 2020-09-19 DIAGNOSIS — Z803 Family history of malignant neoplasm of breast: Secondary | ICD-10-CM | POA: Insufficient documentation

## 2020-09-19 DIAGNOSIS — Z95828 Presence of other vascular implants and grafts: Secondary | ICD-10-CM

## 2020-09-19 DIAGNOSIS — R530 Neoplastic (malignant) related fatigue: Secondary | ICD-10-CM | POA: Insufficient documentation

## 2020-09-19 DIAGNOSIS — D6959 Other secondary thrombocytopenia: Secondary | ICD-10-CM | POA: Insufficient documentation

## 2020-09-19 DIAGNOSIS — C109 Malignant neoplasm of oropharynx, unspecified: Secondary | ICD-10-CM | POA: Insufficient documentation

## 2020-09-19 DIAGNOSIS — Z5111 Encounter for antineoplastic chemotherapy: Secondary | ICD-10-CM | POA: Insufficient documentation

## 2020-09-19 DIAGNOSIS — Z510 Encounter for antineoplastic radiation therapy: Secondary | ICD-10-CM | POA: Insufficient documentation

## 2020-09-19 DIAGNOSIS — Z931 Gastrostomy status: Secondary | ICD-10-CM | POA: Insufficient documentation

## 2020-09-19 DIAGNOSIS — C781 Secondary malignant neoplasm of mediastinum: Secondary | ICD-10-CM | POA: Insufficient documentation

## 2020-09-19 LAB — CBC WITH DIFFERENTIAL (CANCER CENTER ONLY)
Abs Immature Granulocytes: 0.02 10*3/uL (ref 0.00–0.07)
Basophils Absolute: 0 10*3/uL (ref 0.0–0.1)
Basophils Relative: 1 %
Eosinophils Absolute: 0.1 10*3/uL (ref 0.0–0.5)
Eosinophils Relative: 3 %
HCT: 35.5 % — ABNORMAL LOW (ref 39.0–52.0)
Hemoglobin: 12.2 g/dL — ABNORMAL LOW (ref 13.0–17.0)
Immature Granulocytes: 0 %
Lymphocytes Relative: 24 %
Lymphs Abs: 1.2 10*3/uL (ref 0.7–4.0)
MCH: 32.9 pg (ref 26.0–34.0)
MCHC: 34.4 g/dL (ref 30.0–36.0)
MCV: 95.7 fL (ref 80.0–100.0)
Monocytes Absolute: 0.5 10*3/uL (ref 0.1–1.0)
Monocytes Relative: 10 %
Neutro Abs: 3 10*3/uL (ref 1.7–7.7)
Neutrophils Relative %: 62 %
Platelet Count: 200 10*3/uL (ref 150–400)
RBC: 3.71 MIL/uL — ABNORMAL LOW (ref 4.22–5.81)
RDW: 18.3 % — ABNORMAL HIGH (ref 11.5–15.5)
WBC Count: 4.8 10*3/uL (ref 4.0–10.5)
nRBC: 0 % (ref 0.0–0.2)

## 2020-09-19 LAB — CMP (CANCER CENTER ONLY)
ALT: 15 U/L (ref 0–44)
AST: 17 U/L (ref 15–41)
Albumin: 3.6 g/dL (ref 3.5–5.0)
Alkaline Phosphatase: 53 U/L (ref 38–126)
Anion gap: 9 (ref 5–15)
BUN: 12 mg/dL (ref 6–20)
CO2: 26 mmol/L (ref 22–32)
Calcium: 9.5 mg/dL (ref 8.9–10.3)
Chloride: 106 mmol/L (ref 98–111)
Creatinine: 1.35 mg/dL — ABNORMAL HIGH (ref 0.61–1.24)
GFR, Estimated: >60 mL/min (ref >60)
Glucose, Bld: 101 mg/dL — ABNORMAL HIGH (ref 70–99)
Potassium: 4 mmol/L (ref 3.5–5.1)
Sodium: 141 mmol/L (ref 135–145)
Total Bilirubin: 0.6 mg/dL (ref 0.3–1.2)
Total Protein: 6.9 g/dL (ref 6.5–8.1)

## 2020-09-19 LAB — MAGNESIUM: Magnesium: 2 mg/dL (ref 1.7–2.4)

## 2020-09-19 MED ORDER — PALONOSETRON HCL INJECTION 0.25 MG/5ML
INTRAVENOUS | Status: AC
  Filled 2020-09-19: qty 5

## 2020-09-19 MED ORDER — MAGNESIUM SULFATE 2 GM/50ML IV SOLN
INTRAVENOUS | Status: AC
  Filled 2020-09-19: qty 50

## 2020-09-19 MED ORDER — SODIUM CHLORIDE 0.9% FLUSH
10.0000 mL | INTRAVENOUS | Status: DC | PRN
  Administered 2020-09-19: 10 mL via INTRAVENOUS
  Filled 2020-09-19: qty 10

## 2020-09-19 MED ORDER — MAGNESIUM SULFATE 2 GM/50ML IV SOLN
2.0000 g | Freq: Once | INTRAVENOUS | Status: AC
  Administered 2020-09-19: 2 g via INTRAVENOUS

## 2020-09-19 MED ORDER — POTASSIUM CHLORIDE IN NACL 20-0.9 MEQ/L-% IV SOLN
Freq: Once | INTRAVENOUS | Status: AC
  Filled 2020-09-19: qty 1000

## 2020-09-19 MED ORDER — SODIUM CHLORIDE 0.9 % IV SOLN
Freq: Once | INTRAVENOUS | Status: AC
  Filled 2020-09-19: qty 250

## 2020-09-19 MED ORDER — FOSAPREPITANT DIMEGLUMINE INJECTION 150 MG
150.0000 mg | Freq: Once | INTRAVENOUS | Status: AC
  Administered 2020-09-19: 150 mg via INTRAVENOUS
  Filled 2020-09-19: qty 150

## 2020-09-19 MED ORDER — HEPARIN SOD (PORK) LOCK FLUSH 100 UNIT/ML IV SOLN
500.0000 [IU] | Freq: Once | INTRAVENOUS | Status: AC | PRN
  Administered 2020-09-19: 500 [IU]
  Filled 2020-09-19: qty 5

## 2020-09-19 MED ORDER — PALONOSETRON HCL INJECTION 0.25 MG/5ML
0.2500 mg | Freq: Once | INTRAVENOUS | Status: AC
  Administered 2020-09-19: 0.25 mg via INTRAVENOUS

## 2020-09-19 MED ORDER — SODIUM CHLORIDE 0.9 % IV SOLN
10.0000 mg | Freq: Once | INTRAVENOUS | Status: AC
  Administered 2020-09-19: 10 mg via INTRAVENOUS
  Filled 2020-09-19: qty 10

## 2020-09-19 MED ORDER — SODIUM CHLORIDE 0.9% FLUSH
10.0000 mL | INTRAVENOUS | Status: DC | PRN
  Administered 2020-09-19: 10 mL
  Filled 2020-09-19: qty 10

## 2020-09-19 MED ORDER — SODIUM CHLORIDE 0.9 % IV SOLN
38.5000 mg/m2 | Freq: Once | INTRAVENOUS | Status: AC
  Administered 2020-09-19: 100 mg via INTRAVENOUS
  Filled 2020-09-19: qty 100

## 2020-09-19 MED ORDER — SODIUM CHLORIDE 0.9 % IV SOLN: Freq: Once | INTRAVENOUS | Status: DC

## 2020-09-19 NOTE — Progress Notes (Signed)
Per Dr. Julien Nordmann, ok to treat with platelets of 99.

## 2020-09-19 NOTE — Progress Notes (Signed)
Oncology Nurse Navigator Documentation   To provide support, encouragement and care continuity, met with Mr. Ziomek after his initial RT. I also met with him earlier in the day during his chemotherapy appointment.  Mr. Rodriques completed treatment without difficulty, denied questions/concerns. I reviewed the registration/arrival procedure for subsequent treatments. I encouraged him to call me with questions/concerns as tmts proceed.   [name] RN, BSN, OCN Head & Neck Oncology Nurse [place] [facility] Phone # [phone]  Fax # [phone]

## 2020-09-19 NOTE — Patient Instructions (Signed)
[facility]  Discharge Instructions: Thank you for choosing [facility] to provide your oncology and hematology care.   If you have a lab appointment with the Cancer Center, please go directly to the Cancer Center and check in at the registration area.   Wear comfortable clothing and clothing appropriate for easy access to any Portacath or PICC line.   We strive to give you quality time with your provider. You may need to reschedule your appointment if you arrive late (15 or more minutes).  Arriving late affects you and other patients whose appointments are after yours.  Also, if you miss three or more appointments without notifying the office, you may be dismissed from the clinic at the provider's discretion.      For prescription refill requests, have your pharmacy contact our office and allow 72 hours for refills to be completed.    Today you received the following chemotherapy and/or immunotherapy agents: Cisplatin     To help prevent nausea and vomiting after your treatment, we encourage you to take your nausea medication as directed.  BELOW ARE SYMPTOMS THAT SHOULD BE REPORTED IMMEDIATELY: *FEVER GREATER THAN 100.4 F (38 C) OR HIGHER *CHILLS OR SWEATING *NAUSEA AND VOMITING THAT IS NOT CONTROLLED WITH YOUR NAUSEA MEDICATION *UNUSUAL SHORTNESS OF BREATH *UNUSUAL BRUISING OR BLEEDING *URINARY PROBLEMS (pain or burning when urinating, or frequent urination) *BOWEL PROBLEMS (unusual diarrhea, constipation, pain near the anus) TENDERNESS IN MOUTH AND THROAT WITH OR WITHOUT PRESENCE OF ULCERS (sore throat, sores in mouth, or a toothache) UNUSUAL RASH, SWELLING OR PAIN  UNUSUAL VAGINAL DISCHARGE OR ITCHING   Items with * indicate a potential emergency and should be followed up as soon as possible or go to the Emergency Department if any problems should occur.  Please show the CHEMOTHERAPY ALERT CARD or IMMUNOTHERAPY ALERT CARD at check-in to  the Emergency Department and triage nurse.  Should you have questions after your visit or need to cancel or reschedule your appointment, please contact [facility]  Dept: [phone]  and follow the prompts.  Office hours are 8:00 a.m. to 4:30 p.m. Monday - Friday. Please note that voicemails left after 4:00 p.m. may not be returned until the following business day.  We are closed weekends and major holidays. You have access to a nurse at all times for urgent questions. Please call the main number to the clinic Dept: [phone] and follow the prompts.   For any non-urgent questions, you may also contact your provider using MyChart. We now offer e-Visits for anyone 18 and older to request care online for non-urgent symptoms. For details visit [email or web address].   Also download the MyChart app! Go to the app store, search "MyChart", open the app, select [facility], and log in with your MyChart username and password.  Due to Covid, a mask is required upon entering the hospital/clinic. If you do not have a mask, one will be given to you upon arrival. For doctor visits, patients may have 1 support person aged 18 or older with them. For treatment visits, patients cannot have anyone with them due to current Covid guidelines and our immunocompromised population.   Cisplatin injection What is this medication? CISPLATIN (SIS pla tin) is a chemotherapy drug. It targets fast dividing cells, like cancer cells, and causes these cells to die. This medicine is used totreat many types of cancer like bladder, ovarian, and testicular cancers. This medicine may be used for other   purposes; ask your health care provider orpharmacist if you have questions. COMMON BRAND NAME(S): Platinol, Platinol -AQ What should I tell my care team before I take this medication? They need to know if you have any of these conditions: eye disease, vision problems hearing problems kidney  disease low blood counts, like white cells, platelets, or red blood cells tingling of the fingers or toes, or other nerve disorder an unusual or allergic reaction to cisplatin, carboplatin, oxaliplatin, other medicines, foods, dyes, or preservatives pregnant or trying to get pregnant breast-feeding How should I use this medication? This drug is given as an infusion into a vein. It is administered in a hospitalor clinic by a specially trained health care professional. Talk to your pediatrician regarding the use of this medicine in children.Special care may be needed. Overdosage: If you think you have taken too much of this medicine contact apoison control center or emergency room at once. NOTE: This medicine is only for you. Do not share this medicine with others. What if I miss a dose? It is important not to miss a dose. Call your doctor or health careprofessional if you are unable to keep an appointment. What may interact with this medication? This medicine may interact with the following medications: foscarnet certain antibiotics like amikacin, gentamicin, neomycin, polymyxin B, streptomycin, tobramycin, vancomycin This list may not describe all possible interactions. Give your health care provider a list of all the medicines, herbs, non-prescription drugs, or dietary supplements you use. Also tell them if you smoke, drink alcohol, or use illegaldrugs. Some items may interact with your medicine. What should I watch for while using this medication? Your condition will be monitored carefully while you are receiving this medicine. You will need important blood work done while you are taking thismedicine. This drug may make you feel generally unwell. This is not uncommon, as chemotherapy can affect healthy cells as well as cancer cells. Report any side effects. Continue your course of treatment even though you feel ill unless yourdoctor tells you to stop. This medicine may increase your risk of  getting an infection. Call your healthcare professional for advice if you get a fever, chills, or sore throat, or other symptoms of a cold or flu. Do not treat yourself. Try to avoid beingaround people who are sick. Avoid taking medicines that contain aspirin, acetaminophen, ibuprofen, naproxen, or ketoprofen unless instructed by your healthcare professional.These medicines may hide a fever. This medicine may increase your risk to bruise or bleed. Call your doctor orhealth care professional if you notice any unusual bleeding. Be careful brushing and flossing your teeth or using a toothpick because you may get an infection or bleed more easily. If you have any dental work done,tell your dentist you are receiving this medicine. Do not become pregnant while taking this medicine or for 14 months after stopping it. Women should inform their healthcare professional if they wish to become pregnant or think they might be pregnant. Men should not father a child while taking this medicine and for 11 months after stopping it. There is potential for serious side effects to an unborn child. Talk to your healthcareprofessional for more information. Do not breast-feed an infant while taking this medicine. This medicine has caused ovarian failure in some women. This medicine may make it more difficult to get pregnant. Talk to your healthcare professional if youare concerned about your fertility. This medicine has caused decreased sperm counts in some men. This may make it more difficult to father a child.   Talk to your healthcare professional if youare concerned about your fertility. Drink fluids as directed while you are taking this medicine. This will helpprotect your kidneys. Call your doctor or health care professional if you get diarrhea. Do not treatyourself. What side effects may I notice from receiving this medication? Side effects that you should report to your doctor or health care professionalas soon as  possible: allergic reactions like skin rash, itching or hives, swelling of the face, lips, or tongue blurred vision changes in vision decreased hearing or ringing of the ears nausea, vomiting pain, redness, or irritation at site where injected pain, tingling, numbness in the hands or feet signs and symptoms of bleeding such as bloody or black, tarry stools; red or dark brown urine; spitting up blood or brown material that looks like coffee grounds; red spots on the skin; unusual bruising or bleeding from the eyes, gums, or nose signs and symptoms of infection like fever; chills; cough; sore throat; pain or trouble passing urine signs and symptoms of kidney injury like trouble passing urine or change in the amount of urine signs and symptoms of low red blood cells or anemia such as unusually weak or tired; feeling faint or lightheaded; falls; breathing problems Side effects that usually do not require medical attention (report to yourdoctor or health care professional if they continue or are bothersome): loss of appetite mouth sores muscle cramps This list may not describe all possible side effects. Call your doctor for medical advice about side effects. You may report side effects to FDA at1-800-FDA-1088. Where should I keep my medication? This drug is given in a hospital or clinic and will not be stored at home. NOTE: This sheet is a summary. It may not cover all possible information. If you have questions about this medicine, talk to your doctor, pharmacist, orhealth care provider.  2022 Elsevier/Gold Standard (2018-01-29 15:59:17)  

## 2020-09-19 NOTE — Progress Notes (Signed)
Nutrition Follow-up:  Patient with tongue cancer. He has completed 4 cycles of 5-FU/carbo/Keytruda. Patient starting concurrent chemoradiation therapy with weekly cisplatin today. He is s/p PEG placement 7/18.  Met with patient in infusion. He reports appetite is good, eating and drinking normally. Patient denies nutrition impact symptoms. Yesterday, he had scrambled eggs with cheese for breakfast, burger from [facility] for lunch, chicken drumsticks and collard cooked with [place] legs or dinner. He reports snacking in between meals when hungry. Patient reports drinking plenty of water, he is filling 32 ounce mug several times/day. Patient is flushing his tube with 60 ml water once daily. Recommended patient start with 1 carton Osmolite 1.5 daily for added calories and protein. Patient declines giving tube feeding today. Patient reports understanding of prophylactic tube placement, but does not plan to use for feeding unless he is unable to maintain adequate nutrition orally. Patient would like to hold on ordering formula and supplies due to a change in his third party insurance handling the [facility]. Patient reports he has not received his new cards or ID number, he does not want alternate insurance company to be billed.    Medications: Decadron, Zofran, Compazine  Labs: Glucose 101, Cr 1.35  Anthropometrics: Weight 279 lb 9.6 oz on 8/2 stable. Patient weighed 279.1 lb on 7/1 improved from 272 lb on 4/26  Height: 6'3" Weight: 126.8 kg BMI: 34.95   Estimated Energy Needs  Kcals: 4734-0370 Protein: 145-160 Fluid: 3 L  NUTRITION DIAGNOSIS: Food and nutrition related knowledge deficit ongoing   INTERVENTION:  Reinforced the importance of adequate calorie and protein energy intake  Encouraged continuing to eat regular textures as able, discussed adding snacks in between meals with focus on protein - soft and moist high protein foods handout provided Patient will not drink  Boost/Ensure supplements, suggested CIB with whole milk  Encouraged consideration of supplementing oral intake with 1 carton Osmolite 1.5 via tube for added calories and protein Offered support and encouragement Contact information provided   Recommend Osmolite 1.5 - 7 cartons split over 4 feedings/day with 90 ml water flush before and after each feeding. Drink by mouth or give via tube additional 4 cups fluids daily  Prosource TF - Give 45 ml via tube QID   This regimen will provide 2645 kcal, 148 grams protein, 2947 ml total water with flushes daily. Meets 100% estimated needs.   MONITORING, EVALUATION, GOAL: Patient will tolerate increased calories and protein to minimize weight loss throughout treatment   NEXT VISIT: Thursday, August 11 during infusion with Pamala Hurry

## 2020-09-20 ENCOUNTER — Other Ambulatory Visit: Payer: Self-pay

## 2020-09-20 ENCOUNTER — Ambulatory Visit
Admission: RE | Admit: 2020-09-20 | Discharge: 2020-09-20 | Disposition: A | Discharge status: Home / Self Care | Payer: PRIVATE HEALTH INSURANCE | Source: Ambulatory Visit | Attending: Radiation Oncology | Admitting: Radiation Oncology

## 2020-09-20 ENCOUNTER — Ambulatory Visit: Payer: PRIVATE HEALTH INSURANCE | Attending: Radiation Oncology | Admitting: Physical Therapy

## 2020-09-20 ENCOUNTER — Ambulatory Visit: Payer: PRIVATE HEALTH INSURANCE

## 2020-09-20 DIAGNOSIS — C109 Malignant neoplasm of oropharynx, unspecified: Secondary | ICD-10-CM | POA: Diagnosis present

## 2020-09-20 DIAGNOSIS — R293 Abnormal posture: Secondary | ICD-10-CM | POA: Insufficient documentation

## 2020-09-20 DIAGNOSIS — R1310 Dysphagia, unspecified: Secondary | ICD-10-CM | POA: Diagnosis present

## 2020-09-20 DIAGNOSIS — R1313 Dysphagia, pharyngeal phase: Secondary | ICD-10-CM

## 2020-09-20 DIAGNOSIS — Z510 Encounter for antineoplastic radiation therapy: Secondary | ICD-10-CM | POA: Diagnosis not present

## 2020-09-20 NOTE — Therapy (Signed)
[facility] [address] [place], [place], [address] Phone: [phone]   Fax:  [phone]  Speech Language Pathology Evaluation  Patient Details  Name: Alexander [name] MRN: [id number] Date of Birth: August 03, 1960 Referring Provider (SLP): Eppie Gibson   Encounter Date: 09/20/2020   End of Session - 09/20/20 1520     Visit Number 1    Number of Visits 4    Date for SLP Re-Evaluation 12/19/20    SLP Start Time 1110    SLP Stop Time  1155    SLP Time Calculation (min) 45 min    Activity Tolerance Patient tolerated treatment well             Past Medical History:  Diagnosis Date   Asthma    Cancer ([facility])    GERD (gastroesophageal reflux disease)    Sleep apnea    CPAP- does not wear    Past Surgical History:  Procedure Laterality Date   BRONCHIAL NEEDLE ASPIRATION BIOPSY  05/22/2020   Procedure: BRONCHIAL NEEDLE ASPIRATION BIOPSIES;  Surgeon: Garner Nash, DO;  Location: [facility];  Service: Pulmonary;;   DIRECT LARYNGOSCOPY  05/09/2020   Procedure: DIRECT LARYNGOSCOPY;  Surgeon: Rozetta Nunnery, MD;  Location: [facility];  Service: ENT;;   IR GASTROSTOMY TUBE MOD SED  09/13/2020   IR IMAGING GUIDED PORT INSERTION  06/12/2020   MASS EXCISION N/A 05/09/2020   Procedure: EXCISION TONGUE MASS;  Surgeon: Rozetta Nunnery, MD;  Location: [facility];  Service: ENT;  Laterality: N/A;   TONSILLECTOMY     TRACHEOSTOMY TUBE PLACEMENT  05/09/2020   Procedure: TRACHEOSTOMY;  Surgeon: Rozetta Nunnery, MD;  Location: [facility];  Service: ENT;;   VIDEO BRONCHOSCOPY WITH ENDOBRONCHIAL ULTRASOUND N/A 05/22/2020   Procedure: VIDEO BRONCHOSCOPY WITH ENDOBRONCHIAL ULTRASOUND;  Surgeon: Garner Nash, DO;  Location: [facility];  Service: Pulmonary;  Laterality: N/A;    There were no vitals filed for this visit.   Subjective Assessment - 09/20/20 1523     Subjective Pt had MBSS 08-09-20, during first stage of chemo -  with trach placed. Pt states that since trach was removed 09-01-20 that swallowing ability has largely returned to baseline.    Patient is accompained by: --   SO   Currently in Pain? 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 He presented to the ED on 04/30/20 for evaluation of a several month history of left sided neck swelling. 04/30/20 CT neck showed a large soft tissue mass centered in the region of thel eft tongue base that also extended posteriorly to the glossotonsillar sulcus and vallecula. The epiglottis and aryeglottic folds were not well separately delineated and may have been involved by mass. Tumor extension was suspected inferiorly at least to the level of the supraglottic larynx. 05/11/20 CT chest showed subcarinal mass/adenopathy and low lung volumes with bibasilar atelectasis. However, there was no acute intrathoracic pathology. 05/03/20 He saw Dr. [name] who performed a fiberoptic laryngoscopy through the right nostril. Nasopharyngeal  space was clear, but the base of the tongue was full on the left side and epiglottis was slightly pushed back posteriorly with questionable ulcer on the left base of the tongue extending down tot he left vallecular area. Biopsy of the base of the tongue on 05/09/2020 revealed: invasive squamous cell carcinoma.  Strongly p16 positive; emergent tracheostomy was also performed at that time. He saw Dr. Isidore Moos in consult on 05/18/20 and Dr. Chryl Heck on 06/01/20, and received induction chemotherapy of 3 cycles of Carbo/5 FU. He began chemo/radiation together on 8/3 and will receive 35 fractions of radiation to his Base of tongue and bilateral  neck and 7 weeks of cisplatin chemotherapy. He will also receive radiation to his subcarinal node in his chest area. He started on 09/19/20 and will complete 11/07/20. PEG placed 09/13/20, Tracheostomy placed 05/09/20 and decannulated 08/31/20. Pt with modifed (MBSS) 08-09-20 with reguar diet/thin liquids/nectar liquids recomended with small bites/sips, slow rate, alternate bite/sip. Pt with SILENT aspiration once during that study after small bolus of thin - after the swallow on residual in the vallecula/under the epiglottis. Stosh denies overt s/sx of aspiration PNA since that date.      Prior Functional Status   Cognitive/Linguistic Baseline Within functional limits    Type of Home Apartment     Lives With Significant other    Available Support Family;Friend(s)    Vocation Retired      Associate Professor   Overall Cognitive Status Within Functional Limits for tasks assessed      Auditory Comprehension   Overall Auditory Comprehension Appears within functional limits for tasks assessed      Oral Motor/Sensory Function   Overall Oral Motor/Sensory Function Impaired    Labial ROM Within Functional Limits    Lingual ROM Reduced left    Lingual Symmetry Within Functional Limits    Lingual Strength --   slight reduced rt?   Lingual Sensation Within Functional Limits    Lingual Coordination Reduced   pt lingual motion mostly limited to medial movement with AMR (labial margin to margin at fast rate); ? presence of rt UE tremor simultaneously with lingual AMR at fast rate from margin to margin   Velum Within Functional Limits            Pt currently tolerates regular food and thin liquids. He denies overt s/sx aspiration when eating meals or enjoying beverages. POs: Pt ate ham sandwich and drank water with throat clear once. Other than that, no overt s/sx aspiration noted with POs today. Thyroid elevation appeared adequate, and swallows appeared timely. Oral residue noted as WNL. Pt's swallow deemed WFL/WNL  at this time. See "HPI" for details about pt's MBSS 08-09-20. He reports that since trach removed he feels his swallowing has largely returned to [facility].  Because data states the risk for dysphagia during and after radiation treatment is high due to undergoing radiation tx, SLP taught pt about the possibility of reduced/limited ability for PO intake during rad tx. SLP encouraged pt to continue swallowing POs as far into rad tx as possible, even ingesting POs and/or completing HEP shortly after administration of pain meds. Among other modifications for days when pt cannot functionally swallow, SLP talked about performing only non-swallowing tasks on the handout/HEP, and if necessary to cycle through the swallowing portion so the program of exercises can be completed instead of fatiguing on one of the swallowing exercises and not being able to perform the other swallowing exercises. Pt was then told to add all  reps of swallowing tasks back in when it becomes possible to do so.  SLP educated pt re: changes to swallowing musculature after rad tx, and why adherence to dysphagia HEP provided today and PO consumption was necessary to inhibit muscular disuse atrophy and to reduce muscle fibrosis following rad tx. Pt demonstrated understanding of these things to SLP.    SLP then developed a HEP for pt and pt was instructed how to perform exercises involving lingual, vocal, and pharyngeal strengthening. SLP performed each exercise and pt return demonstrated each exercise. SLP ensured pt performance was correct prior to moving on to next exercise. Pt was instructed to complete this program 2 times a day, 6-7 days/week until 6 months after his last rad tx, then x2-a week after that.                  SLP Education - 09/20/20 1518     Education Details HEP procedure, late effects head/neck radiation on swallow ability, rationale for HEP, swallow precautions    Person(s) Educated Patient    Methods  Explanation;Demonstration;Verbal cues;Handout    Comprehension Verbalized understanding;Returned demonstration;Verbal cues required;Need further instruction              SLP Short Term Goals - 09/20/20 0911                     SLP SHORT TERM GOAL #1    Title pt will complete HEP with rare min A    Time 2    Period --   vists, for all STGs    Status New           SLP SHORT TERM GOAL #2    Title pt will tell SLP why pt is completing HEP with modified independence    Time 1    Status New           SLP SHORT TERM GOAL #3    Title pt will describe 3 overt s/s aspiration PNA with modified independence    Time 3    Status New           SLP SHORT TERM GOAL #4    Title pt will tell SLP how a food journal could hasten return to a more normalized diet    Time 3    Status New                     SLP Long Term Goals - 09/20/20 0912                     SLP LONG TERM GOAL #1    Title pt will complete HEP with modified independence over two visits    Time 4    Period --   visits, for all LTGs    Status New           SLP LONG TERM GOAL #2    Title pt will describe how to modify HEP over time, and the timeline associated with reduction in HEP frequency with modified independence over two sessions    Time 5    Status New                     Plan - 09/20/20 0910       Clinical Impression Statement At this time pt swallowing is deemed WNL/WFL with regular diet/thin liquids, despite one throat clear after liquid bolus today. Pt reports he feels like  his swallowing has largely returned to baseline after trach was removed 09-01-20. There are no overt s/s aspiration reported by pt at this time with his POs at home; no overt s/sx aspiration PNA reported by pt or observed by SLP today. SLP designed an individualized HEP for dysphagia and pt completed each exercise on their own with consistent min-mod cues faded to modified independent. Data indicate that pt's swallow ability will  likely decrease over the course of radiation/chemotherapy and could very well decline over time following conclusion of their radiation therapy due to muscle disuse atrophy and/or muscle fibrosis. Pt will cont to need to be seen by SLP in order to assess safety of PO intake, assess the need for recommending any objective swallow assessment, and ensuring pt correctly completes the individualized HEP.    Speech Therapy Frequency --   once approx every four weeks    Duration --   90 days for this reporting period; overall, approx 7 visits    Treatment/Interventions Aspiration precaution training;Pharyngeal strengthening exercises;Diet toleration management by SLP;Trials of upgraded texture/liquids;Patient/family education;SLP instruction and feedback;Compensatory techniques    Potential to Achieve Goals Good    SLP Home Exercise Plan provided    Consulted and Agree with Plan of Care           Patient will benefit from skilled therapeutic intervention in order to improve the following deficits and impairments:   Dysphagia, pharyngeal phase    Problem List Patient Active Problem List   Diagnosis Date Noted   Weight loss, unintentional 06/01/2020   Sepsis due to undetermined organism ([facility]) 05/28/2020   Malnutrition of moderate degree 05/28/2020   GERD (gastroesophageal reflux disease)    Volume depletion    Hyperbilirubinemia    Hyponatremia    Mild protein-calorie malnutrition ([facility])    Squamous cell cancer of tongue ([facility])    Pressure ulcer caused by device    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[facility]) 05/17/2020   Mediastinal adenopathy 05/17/2020   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 ,[place], CCC-SLP  09/20/2020, 3:27 PM  [facility] [address] [place], [place], [address] Phone: [phone]   Fax:  [phone]  Name: Obediah Mccambridge MRN: [id number] Date of Birth: [birth year]

## 2020-09-20 NOTE — Patient Instructions (Signed)
SWALLOWING EXERCISES Do these until 6 months after your last day of radiation, then 2-3 times per week afterwards  Effortful Swallows - Press your tongue against the roof of your mouth for 3 seconds, then squeeze the muscles in your neck while you swallow your saliva or a sip of water - Repeat 10-15 times, 2-3 times a day, and use whenever you eat or drink  Masako Swallow - swallow with your tongue sticking out - Stick tongue out past your lips and gently bite tongue with your teeth - Swallow, while holding your tongue with your teeth - Repeat 10-15 times, 2-3 times a day *use a wet spoon if your mouth gets dry*   Shaker Exercise - head lift - Lie flat on your back in your bed or on a couch without pillows - Raise your head and look at your feet - KEEP YOUR SHOULDERS DOWN - HOLD FOR 45-60 SECONDS, then lower your head back down - Repeat 3 times, 2-3 times a day  Mendelsohn Maneuver - "half swallow" exercise - Start to swallow, and keep your Adam's apple up by squeezing hard with the muscles of the throat - Hold the squeeze for 5-7 seconds and then relax - Repeat 10-15 times, 2-3 times a day *use a wet spoon if your mouth gets dry*           5    "Super Swallow"  - Take a breath and hold it  - Bear down (like pushing your bowels)  - Swallow then IMMEDIATELY cough  - Repeat 10 times, 2-3 times a day  

## 2020-09-20 NOTE — Therapy (Signed)
Alexander [name], [place], [address] Phone: [phone]   Fax:  [phone]  Physical Therapy Evaluation  Patient Details  Name: Alexander [name] MRN: [id number] Date of Birth: [birth year] Referring Provider (PT): Reita May Date: 09/20/2020   PT End of Session - 09/20/20 1102     Visit Number 1    Number of Visits 2    Date for PT Re-Evaluation 11/22/20    PT Start Time 1031    PT Stop Time 1100    PT Time Calculation (min) 29 min    Activity Tolerance Patient tolerated treatment well    Behavior During Therapy [facility] for tasks assessed/performed             Past Medical History:  Diagnosis Date   Asthma    Cancer ([place])    GERD (gastroesophageal reflux disease)    Sleep apnea    CPAP- does not wear    Past Surgical History:  Procedure Laterality Date   BRONCHIAL NEEDLE ASPIRATION BIOPSY  05/22/2020   Procedure: BRONCHIAL NEEDLE ASPIRATION BIOPSIES;  Surgeon: Garner Nash, DO;  Location: [place];  Service: Pulmonary;;   DIRECT LARYNGOSCOPY  05/09/2020   Procedure: DIRECT LARYNGOSCOPY;  Surgeon: Rozetta Nunnery, MD;  Location: [place];  Service: ENT;;   IR GASTROSTOMY TUBE MOD SED  09/13/2020   IR IMAGING GUIDED PORT INSERTION  06/12/2020   MASS EXCISION N/A 05/09/2020   Procedure: EXCISION TONGUE MASS;  Surgeon: Rozetta Nunnery, MD;  Location: [place];  Service: ENT;  Laterality: N/A;   TONSILLECTOMY     TRACHEOSTOMY TUBE PLACEMENT  05/09/2020   Procedure: TRACHEOSTOMY;  Surgeon: Rozetta Nunnery, MD;  Location: [place];  Service: ENT;;   VIDEO BRONCHOSCOPY WITH ENDOBRONCHIAL ULTRASOUND N/A 05/22/2020   Procedure: VIDEO BRONCHOSCOPY WITH ENDOBRONCHIAL ULTRASOUND;  Surgeon: Garner Nash, DO;  Location: [place];  Service: Pulmonary;  Laterality: N/A;    There were no vitals filed for this visit.    Subjective Assessment - 09/20/20 1030     Subjective I am alright. I  have been better but I have also been worse.    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 That portion of the mass had poorly defined margins but appeared to cross the midline and was estimated to measure at least 5.0 x 4.7 x 3.4 cm. The epiglottis and aryeglottic folds were not well separately delineated and may have been involved by mass. Tumor extension was suspected inferiorly at least to the level of the supraglottic larynx, 05/11/20 CT chest showed subcarinal mass/adenopathy and low lung volumes with bibasilar atelectasis. However, there was no acute intrathoracic pathology, 05/03/20 He saw Dr. [name] who performed a fiberoptic laryngoscopy through the right nostril. Nasopharyngeal was clear, but the base of the tongue was full on the left side and epiglottis was slightly pushed back posteriorly with questionable ulcer on the left base of the tongue extending down to the left vallecular area, biopsy of the base of the tongue on 05/09/2020 revealed: invasive squamous cell carcinoma.  Strongly p16 positive; emergent tracheostomy was also performed at that time, received induction chemotherapy of 3  cycles of Carbo/5 FU, will now receive 35 fractions of radiation to his Base of tongue and bilateral neck and 7 weeks of cisplatin chemotherapy. He will also receive radiation to his subcarinal node in his chest area. He started on 09/19/20 and will complete 11/07/20, PAC 06/12/20, PEG 09/13/20, Tracheostomy placed 05/09/20 and decannulated 08/31/20.    Patient Stated Goals to gain info from providers    Currently in Pain? No/denies    Pain Score 0-No pain                [facility] PT Assessment - 09/20/20  0001       Assessment   Medical Diagnosis SCC base of tongue    Referring Provider (PT) [facility]    Onset Date/Surgical Date 05/09/20    Hand Dominance Right    Prior Therapy none      Precautions   Precautions Other (comment)    Precaution Comments active cancer      Restrictions   Weight Bearing Restrictions No      Balance Screen   Has the patient fallen in the past 6 months No    Has the patient had a decrease in activity level because of a fear of falling?  No    Is the patient reluctant to leave their home because of a fear of falling?  No      Home Environment   Living Environment Private residence    Living Arrangements Alone    Available Help at Discharge Family    Type of [place] Access Level entry    Balm One level      Prior Function   Level of [place] On disability    Leisure pt is not currently exercising      Cognition   Overall Cognitive Status Within Functional Limits for tasks assessed      Functional Tests   Functional tests Sit to Stand      Sit to Stand   Comments 30 sec sit to stand: 12 reps which is below average for his age but he reports he is bothered by his feeding tube      Posture/Postural Control   Posture/Postural Control Postural limitations    Postural Limitations Rounded Shoulders;Forward head      ROM / Strength   AROM / PROM / Strength AROM      AROM   AROM Assessment Site Cervical    Cervical Flexion WFL    Cervical Extension WFL    Cervical - Right Side Bend WFL    Cervical - Left Side Bend WFL    Cervical - Right Rotation WFL    Cervical - Left Rotation [facility]      Ambulation/Gait   Ambulation/Gait Yes    Ambulation/Gait Assistance 7: Independent    Ambulation Distance (Feet) 10 Feet               LYMPHEDEMA/ONCOLOGY QUESTIONNAIRE - 09/20/20 0001       Lymphedema Assessments   Lymphedema Assessments Head and Neck      Head and Neck   4 cm superior to  sternal notch around neck 46.6 cm    6 cm superior to sternal notch around neck 46.8 cm    8 cm superior to sternal notch around neck 47.5 cm                     Objective measurements completed on examination:  See above findings.               PT Education - 09/20/20 1108     Education Details Neck ROM, importance of posture when sitting, standing and lying down, deep breathing, walking program and importance of staying active throughout treatment, CURE article on staying active, "Why exercise?" flyer, lymphedema and PT info    Person(s) Educated Patient    Methods Explanation;Handout    Comprehension Verbalized understanding                 PT Long Term Goals - 09/20/20 1108       PT LONG TERM GOAL #1   Title Pt will demonstrate baseline cervical ROM and not demonstrate any signs or symptoms of lymphedema.    Time 9    Period Weeks    Status New    Target Date 11/22/20                Head and Neck Clinic Goals - 09/20/20 1107       Patient will be able to verbalize understanding of a home exercise program for cervical range of motion, posture, and walking.          Time 1    Period Days    Status Achieved      Patient will be able to verbalize understanding of proper sitting and standing posture.          Time 1    Period Days    Status Achieved      Patient will be able to verbalize understanding of lymphedema risk and availability of treatment for this condition.          Time 1    Period Days    Status Achieved                Plan - 09/20/20 1105     Clinical Impression Statement Pt reports to PT with recently diagnosed base of tongue cancer. He is undergoing chemo and radiation. His cervical ROM is WFL and he scored below average on his 30 sec sit to stand but pt reports it is due to his recently placed feeding tube causing discomfort. His gait was [facility]. Educated pt about signs and symptoms of lymphedema as well as  anatomy and physiology of lymphatic system. Educated pt in importance of staying as active as possible throughout treatment to decrease fatigue as well as head and neck ROM exercises to decrease loss of ROM. Will see pt after completion of radiation to reassess ROM and assess for lymphedema to determine therapy needs at that time.    Stability/Clinical Decision Making Stable/Uncomplicated    Clinical Decision Making Low    Rehab Potential Good    PT Frequency --   eval and 1 f/u visit   PT Duration --   9 weeks   PT Treatment/Interventions ADLs/Self Care Home Management;Patient/family education;Therapeutic exercise    PT Next Visit Plan reassess baselines    PT Home Exercise Plan head and neck ROM exercises    Consulted and Agree with Plan of Care Patient             Patient will benefit from skilled therapeutic intervention in order to improve the following deficits and impairments:  Postural dysfunction, Decreased knowledge of precautions  Visit Diagnosis: Abnormal posture  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[facility])     Problem List Patient Active Problem List   Diagnosis Date Noted   Weight loss, unintentional 06/01/2020  Sepsis due to undetermined organism ([place]) 05/28/2020   Malnutrition of moderate degree 05/28/2020   GERD (gastroesophageal reflux disease)    Volume depletion    Hyperbilirubinemia    Hyponatremia    Mild protein-calorie malnutrition (HCC)    Squamous cell cancer of tongue (HCC)    Pressure ulcer caused by device    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[place]) 05/17/2020   Mediastinal adenopathy 05/17/2020   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

## 2020-09-20 NOTE — Progress Notes (Signed)
Oncology Nurse Navigator Documentation   I met with Alexander [name] today before his appointments with PT and Swallowing Therapy. He has tolerated treatment well and his feeling good today. He denies any questions or concerns at this time, but knows to call me if he needs anything.  [name] RN, BSN, OCN Head & Neck Oncology Nurse [place] [facility] Phone # [phone]  Fax # [phone]

## 2020-09-21 ENCOUNTER — Telehealth: Payer: Self-pay

## 2020-09-21 ENCOUNTER — Ambulatory Visit
Admission: RE | Admit: 2020-09-21 | Discharge: 2020-09-21 | Disposition: A | Discharge status: Home / Self Care | Payer: PRIVATE HEALTH INSURANCE | Source: Ambulatory Visit | Attending: Radiation Oncology | Admitting: Radiation Oncology

## 2020-09-21 DIAGNOSIS — Z510 Encounter for antineoplastic radiation therapy: Secondary | ICD-10-CM | POA: Diagnosis not present

## 2020-09-21 NOTE — Telephone Encounter (Signed)
Patient called reporting severe hiccups. Instructed patient to take small sips of water, vent his g-tube, continue his omeprazole, and try his antiemetics (compazine) to help achieve relief. Encouraged him to call back if symptoms did not improve. Verbalized understanding.

## 2020-09-24 ENCOUNTER — Other Ambulatory Visit (HOSPITAL_COMMUNITY): Payer: Self-pay

## 2020-09-24 ENCOUNTER — Encounter: Payer: Self-pay | Admitting: Hematology and Oncology

## 2020-09-24 ENCOUNTER — Ambulatory Visit
Admission: RE | Admit: 2020-09-24 | Discharge: 2020-09-24 | Disposition: A | Discharge status: Home / Self Care | Payer: PRIVATE HEALTH INSURANCE | Source: Ambulatory Visit | Attending: Radiation Oncology | Admitting: Radiation Oncology

## 2020-09-24 ENCOUNTER — Other Ambulatory Visit: Payer: Self-pay | Admitting: Radiation Oncology

## 2020-09-24 ENCOUNTER — Other Ambulatory Visit: Payer: Self-pay

## 2020-09-24 VITALS — BP 95/63 | HR 80 | Temp 96.8°F | Resp 18

## 2020-09-24 DIAGNOSIS — C01 Malignant neoplasm of base of tongue: Secondary | ICD-10-CM

## 2020-09-24 DIAGNOSIS — Z510 Encounter for antineoplastic radiation therapy: Secondary | ICD-10-CM | POA: Diagnosis not present

## 2020-09-24 DIAGNOSIS — C109 Malignant neoplasm of oropharynx, unspecified: Secondary | ICD-10-CM

## 2020-09-24 MED ORDER — SONAFINE EX EMUL
1.0000 "application " | Freq: Two times a day (BID) | CUTANEOUS | Status: DC
  Administered 2020-09-24: 1 via TOPICAL

## 2020-09-24 MED ORDER — LIDOCAINE VISCOUS HCL 2 % MT SOLN
OROMUCOSAL | 3 refills | Status: DC
  Filled 2020-09-24: qty 200, 10d supply, fill #0

## 2020-09-24 NOTE — Progress Notes (Signed)
Pt here for patient teaching.    Pt given Radiation and You booklet, Managing Acute Radiation Side Effects for Head and Neck Cancer handout, skin care instructions, and Sonafine.    Reviewed areas of pertinence such as fatigue, hair loss, mouth changes, skin changes, throat changes, earaches, and taste changes .   Pt able to give teach back of to pat skin, use unscented/gentle soap, and drink plenty of water,apply Sonafine bid, avoid applying anything to skin within 4 hours of treatment, and to use an electric razor if they must shave.   Pt demonstrated understanding and verbalizes understanding of information given and will contact nursing with any questions or concerns.    Http://rtanswers.org/treatmentinformation/whattoexpect/index         

## 2020-09-25 ENCOUNTER — Ambulatory Visit
Admission: RE | Admit: 2020-09-25 | Discharge: 2020-09-25 | Disposition: A | Discharge status: Home / Self Care | Payer: PRIVATE HEALTH INSURANCE | Source: Ambulatory Visit | Attending: Radiation Oncology | Admitting: Radiation Oncology

## 2020-09-25 DIAGNOSIS — Z510 Encounter for antineoplastic radiation therapy: Secondary | ICD-10-CM | POA: Diagnosis not present

## 2020-09-26 ENCOUNTER — Ambulatory Visit
Admission: RE | Admit: 2020-09-26 | Discharge: 2020-09-26 | Disposition: A | Discharge status: Home / Self Care | Payer: PRIVATE HEALTH INSURANCE | Source: Ambulatory Visit | Attending: Radiation Oncology | Admitting: Radiation Oncology

## 2020-09-26 ENCOUNTER — Other Ambulatory Visit: Payer: Self-pay

## 2020-09-26 DIAGNOSIS — Z510 Encounter for antineoplastic radiation therapy: Secondary | ICD-10-CM | POA: Diagnosis not present

## 2020-09-27 ENCOUNTER — Other Ambulatory Visit: Payer: Self-pay

## 2020-09-27 ENCOUNTER — Other Ambulatory Visit: Payer: Commercial Managed Care - PPO

## 2020-09-27 ENCOUNTER — Ambulatory Visit: Payer: Commercial Managed Care - PPO | Admitting: Hematology and Oncology

## 2020-09-27 ENCOUNTER — Ambulatory Visit: Payer: Commercial Managed Care - PPO

## 2020-09-27 ENCOUNTER — Inpatient Hospital Stay: Payer: PRIVATE HEALTH INSURANCE

## 2020-09-27 ENCOUNTER — Inpatient Hospital Stay (HOSPITAL_BASED_OUTPATIENT_CLINIC_OR_DEPARTMENT_OTHER): Payer: PRIVATE HEALTH INSURANCE | Admitting: Hematology and Oncology

## 2020-09-27 ENCOUNTER — Ambulatory Visit
Admission: RE | Admit: 2020-09-27 | Discharge: 2020-09-27 | Disposition: A | Discharge status: Home / Self Care | Payer: PRIVATE HEALTH INSURANCE | Source: Ambulatory Visit | Attending: Radiation Oncology | Admitting: Radiation Oncology

## 2020-09-27 ENCOUNTER — Encounter: Payer: Self-pay | Admitting: Hematology and Oncology

## 2020-09-27 ENCOUNTER — Inpatient Hospital Stay: Payer: PRIVATE HEALTH INSURANCE | Admitting: Nutrition

## 2020-09-27 ENCOUNTER — Other Ambulatory Visit: Payer: Self-pay | Admitting: Hematology and Oncology

## 2020-09-27 VITALS — BP 105/70 | HR 72 | Temp 97.9°F | Resp 17 | Wt 280.4 lb

## 2020-09-27 DIAGNOSIS — R5383 Other fatigue: Secondary | ICD-10-CM

## 2020-09-27 DIAGNOSIS — K1231 Oral mucositis (ulcerative) due to antineoplastic therapy: Secondary | ICD-10-CM

## 2020-09-27 DIAGNOSIS — C01 Malignant neoplasm of base of tongue: Secondary | ICD-10-CM

## 2020-09-27 DIAGNOSIS — Z510 Encounter for antineoplastic radiation therapy: Secondary | ICD-10-CM | POA: Diagnosis not present

## 2020-09-27 DIAGNOSIS — R634 Abnormal weight loss: Secondary | ICD-10-CM | POA: Diagnosis not present

## 2020-09-27 DIAGNOSIS — T451X5A Adverse effect of antineoplastic and immunosuppressive drugs, initial encounter: Secondary | ICD-10-CM

## 2020-09-27 LAB — CBC WITH DIFFERENTIAL (CANCER CENTER ONLY)
Abs Immature Granulocytes: 0.04 10*3/uL (ref 0.00–0.07)
Basophils Absolute: 0 10*3/uL (ref 0.0–0.1)
Basophils Relative: 0 %
Eosinophils Absolute: 0.1 10*3/uL (ref 0.0–0.5)
Eosinophils Relative: 2 %
HCT: 35.4 % — ABNORMAL LOW (ref 39.0–52.0)
Hemoglobin: 12.2 g/dL — ABNORMAL LOW (ref 13.0–17.0)
Immature Granulocytes: 1 %
Lymphocytes Relative: 10 %
Lymphs Abs: 0.5 10*3/uL — ABNORMAL LOW (ref 0.7–4.0)
MCH: 33 pg (ref 26.0–34.0)
MCHC: 34.5 g/dL (ref 30.0–36.0)
MCV: 95.7 fL (ref 80.0–100.0)
Monocytes Absolute: 0.5 10*3/uL (ref 0.1–1.0)
Monocytes Relative: 10 %
Neutro Abs: 4.1 10*3/uL (ref 1.7–7.7)
Neutrophils Relative %: 77 %
Platelet Count: 200 10*3/uL (ref 150–400)
RBC: 3.7 MIL/uL — ABNORMAL LOW (ref 4.22–5.81)
RDW: 17.3 % — ABNORMAL HIGH (ref 11.5–15.5)
WBC Count: 5.3 10*3/uL (ref 4.0–10.5)
nRBC: 0 % (ref 0.0–0.2)

## 2020-09-27 LAB — MAGNESIUM: Magnesium: 2 mg/dL (ref 1.7–2.4)

## 2020-09-27 LAB — BASIC METABOLIC PANEL - CANCER CENTER ONLY
Anion gap: 9 (ref 5–15)
BUN: 19 mg/dL (ref 6–20)
CO2: 28 mmol/L (ref 22–32)
Calcium: 9.2 mg/dL (ref 8.9–10.3)
Chloride: 101 mmol/L (ref 98–111)
Creatinine: 1.09 mg/dL (ref 0.61–1.24)
GFR, Estimated: >60 mL/min (ref >60)
Glucose, Bld: 94 mg/dL (ref 70–99)
Potassium: 4.4 mmol/L (ref 3.5–5.1)
Sodium: 138 mmol/L (ref 135–145)

## 2020-09-27 MED ORDER — SODIUM CHLORIDE 0.9 % IV SOLN
Freq: Once | INTRAVENOUS | Status: AC
  Filled 2020-09-27: qty 250

## 2020-09-27 MED ORDER — SODIUM CHLORIDE 0.9% FLUSH
10.0000 mL | INTRAVENOUS | Status: DC | PRN
  Administered 2020-09-27: 10 mL
  Filled 2020-09-27: qty 10

## 2020-09-27 MED ORDER — POTASSIUM CHLORIDE IN NACL 20-0.9 MEQ/L-% IV SOLN
Freq: Once | INTRAVENOUS | Status: AC
  Filled 2020-09-27: qty 1000

## 2020-09-27 MED ORDER — PALONOSETRON HCL INJECTION 0.25 MG/5ML
INTRAVENOUS | Status: AC
  Administered 2020-09-27: 0.25 mg
  Filled 2020-09-27: qty 5

## 2020-09-27 MED ORDER — DEXAMETHASONE SODIUM PHOSPHATE 100 MG/10ML IJ SOLN
10.0000 mg | Freq: Once | INTRAMUSCULAR | Status: AC
  Administered 2020-09-27: 10 mg via INTRAVENOUS
  Filled 2020-09-27: qty 10

## 2020-09-27 MED ORDER — MAGNESIUM SULFATE 2 GM/50ML IV SOLN
INTRAVENOUS | Status: AC
  Filled 2020-09-27: qty 50

## 2020-09-27 MED ORDER — PALONOSETRON HCL INJECTION 0.25 MG/5ML: 0.2500 mg | Freq: Once | INTRAVENOUS | Status: DC

## 2020-09-27 MED ORDER — SODIUM CHLORIDE 0.9 % IV SOLN
38.5000 mg/m2 | Freq: Once | INTRAVENOUS | Status: AC
  Administered 2020-09-27: 100 mg via INTRAVENOUS
  Filled 2020-09-27: qty 100

## 2020-09-27 MED ORDER — MAGNESIUM SULFATE 2 GM/50ML IV SOLN
2.0000 g | Freq: Once | INTRAVENOUS | Status: AC
  Administered 2020-09-27: 2 g via INTRAVENOUS

## 2020-09-27 MED ORDER — HEPARIN SOD (PORK) LOCK FLUSH 100 UNIT/ML IV SOLN
500.0000 [IU] | Freq: Once | INTRAVENOUS | Status: AC | PRN
  Administered 2020-09-27: 500 [IU]
  Filled 2020-09-27: qty 5

## 2020-09-27 MED ORDER — SODIUM CHLORIDE 0.9 % IV SOLN
150.0000 mg | Freq: Once | INTRAVENOUS | Status: AC
  Administered 2020-09-27: 150 mg via INTRAVENOUS
  Filled 2020-09-27: qty 150

## 2020-09-27 NOTE — Patient Instructions (Signed)

## 2020-09-27 NOTE — Patient Instructions (Signed)
[name] ONCOLOGY  Discharge Instructions: Thank you for choosing [facility] to provide your oncology and hematology care.   If you have a lab appointment with the [facility], please go directly to the [facility] and check in at the registration area.   Wear comfortable clothing and clothing appropriate for easy access to any Portacath or PICC line.   We strive to give you quality time with your provider. You may need to reschedule your appointment if you arrive late (15 or more minutes).  Arriving late affects you and other patients whose appointments are after yours.  Also, if you miss three or more appointments without notifying the office, you may be dismissed from the clinic at the provider's discretion.      For prescription refill requests, have your pharmacy contact our office and allow 72 hours for refills to be completed.    Today you received the following chemotherapy and/or immunotherapy agents Cisplatin with Pre & Post hydration      To help prevent nausea and vomiting after your treatment, we encourage you to take your nausea medication as directed.  BELOW ARE SYMPTOMS THAT SHOULD BE REPORTED IMMEDIATELY: *FEVER GREATER THAN 100.4 F (38 C) OR HIGHER *CHILLS OR SWEATING *NAUSEA AND VOMITING THAT IS NOT CONTROLLED WITH YOUR NAUSEA MEDICATION *UNUSUAL SHORTNESS OF BREATH *UNUSUAL BRUISING OR BLEEDING *URINARY PROBLEMS (pain or burning when urinating, or frequent urination) *BOWEL PROBLEMS (unusual diarrhea, constipation, pain near the anus) TENDERNESS IN MOUTH AND THROAT WITH OR WITHOUT PRESENCE OF ULCERS (sore throat, sores in mouth, or a toothache) UNUSUAL RASH, SWELLING OR PAIN  UNUSUAL VAGINAL DISCHARGE OR ITCHING   Items with * indicate a potential emergency and should be followed up as soon as possible or go to the Emergency Department if any problems should occur.  Please show the CHEMOTHERAPY ALERT CARD or IMMUNOTHERAPY  ALERT CARD at check-in to the Emergency Department and triage nurse.  Should you have questions after your visit or need to cancel or reschedule your appointment, please contact [facility]  Dept: [phone]  and follow the prompts.  Office hours are 8:00 a.m. to 4:30 p.m. Monday - Friday. Please note that voicemails left after 4:00 p.m. may not be returned until the following business day.  We are closed weekends and major holidays. You have access to a nurse at all times for urgent questions. Please call the main number to the clinic Dept: [phone] and follow the prompts.   For any non-urgent questions, you may also contact your provider using MyChart. We now offer e-Visits for anyone 95 and older to request care online for non-urgent symptoms. For details visit [email or web address].   Also download the MyChart app! Go to the app store, search "MyChart", open the app, select [facility], and log in with your MyChart username and password.  Due to Covid, a mask is required upon entering the hospital/clinic. If you do not have a mask, one will be given to you upon arrival. For doctor visits, patients may have 1 support person aged 37 or older with them. For treatment visits, patients cannot have anyone with them due to current Covid guidelines and our immunocompromised population.

## 2020-09-27 NOTE — Progress Notes (Signed)
Nutrition follow-up completed with patient during infusion for tongue cancer.  Patient receiving concurrent chemoradiation therapy with weekly cisplatin.  He is status post PEG placement on July 18.  Weight is stable and documented as 280.4 pounds August 11 from 279.6 pounds August 2.   Labs were reviewed.   Patient denies nausea, vomiting, constipation, diarrhea. Patient flushing feeding tube daily and reports no problems cleaning around the feeding tube.   He is not using his feeding tube for any type of oral nutrition support at this time.  He continues to eat well. Patient declines using Osmolite 1.5 through his tube at this time.  Estimated nutrition needs: 2700-3150 cal, 145-160 g protein, 3 L fluid.  Nutrition diagnosis: Food and nutrition related knowledge deficit improving.  Intervention: Enforced importance of continued adequate calorie and protein intake. Encourage snacks throughout the day. We will initiate tube feeding once patient's weight declines and he is less able to consume adequate oral nutrition.  Monitoring, evaluation, goals: Patient will tolerate adequate calories and protein to minimize weight loss throughout treatment.  Next visit: Wednesday, August 17 during infusion.  **Disclaimer: This note was dictated with voice recognition software. Similar sounding words can inadvertently be transcribed and this note may contain transcription errors which may not have been corrected upon publication of note.**

## 2020-09-27 NOTE — Assessment & Plan Note (Signed)
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  He completed 4 cycles of 5-FU/carbo/Keytruda for induction.  He had remarkable response and no toxicity to report He was discussed in the head and neck tumor board and the plan was to consider concurrent chemoradiation to the neck and radiation to the mediastinum given oligometastatic disease.  He clearly understands this is not necessarily curative in intent however may give him prolonged progression free interval.   He started weekly cisplatin 09/19/2020 He has been tolerating it very well.  Okay to proceed with week 2 of cisplatin. He will return for follow-up in 1 week.

## 2020-09-27 NOTE — Progress Notes (Signed)
[place] FOLLOW UP NOTE  Patient Care Team: Pcp, No as PCP - General [place], Stephani Police, RN as Oncology Nurse Navigator Benay Pike, MD as Consulting Physician (Hematology and Oncology) Rozetta Nunnery, MD as Consulting Physician (Otolaryngology) Eppie Gibson, MD as Consulting Physician (Radiation Oncology)   CHIEF COMPLAINTS/PURPOSE OF CONSULTATION:  Pre chemo visit for concurrent cisplatin/radiation.  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  He completed 4 cycles of 5-FU/carbo/Keytruda for induction.  He had remarkable response and no toxicity to report He was discussed in the head and neck tumor board and the plan was to consider concurrent chemoradiation to the neck and radiation to the mediastinum given oligometastatic disease.  He clearly understands this is not necessarily curative in intent however may give him prolonged progression free interval.   He started weekly cisplatin 09/19/2020 He has been tolerating it very well.  Okay to proceed with week 2 of cisplatin. He will return for follow-up in 1 week.  Weight loss, unintentional This has stabilized since last visit.  He is able to eat and drink.  He also has a G-tube in place if he cannot swallow because of mucositis.  Advised him to start using the G-tube if swallowing becomes painful and he expressed understanding.  Chemotherapy-induced fatigue  Mild chemotherapy-induced fatigue.  We will continue to monitor.  No additional intervention needed.  We will proceed with  full dose cisplatin as planned.  No orders of the defined types were placed in this encounter.   HISTORY OF PRESENTING ILLNESS:   Alexander [name] 60 y.o. 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[place])  05/17/2020 Initial Diagnosis   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[place])   05/18/2020 Cancer Staging   Staging form: Pharynx - HPV-Mediated Oropharynx, AJCC 8th Edition - Clinical stage from 05/18/2020: cT4, cN3, p16+ - Signed by Eppie Gibson, MD on 05/22/2020   06/01/2020 Cancer Staging   Staging form: Pharynx - HPV-Mediated Oropharynx, AJCC 8th Edition - Pathologic stage from 06/01/2020: No Stage Recommended (cT4, cN3, cM1, p16+) - Signed by Benay Pike, MD on 06/01/2020 Stage prefix: Initial diagnosis   06/12/2020 - 08/21/2020 Chemotherapy          09/10/2020 - 09/10/2020 Chemotherapy          09/19/2020 -  Chemotherapy    Patient is on Treatment Plan: HEAD/NECK CISPLATIN Q7D        INTERIM HISTORY  Mr Dufrene is here for a follow up by himself. He received 4 cycles of carboplatin/5-FU and Keytruda for induction.   Given oligometastatic disease and excellent tolerance to induction chemo, we have decided to proceed with consolidation chemoradiation to the neck and palliative radiation to the mediastinum.  He is post first weekly cycle of cisplatin and is here for precycle 2 of chemotherapy.  He is doing quite well, describes some fatigue, some hoarseness, no intolerable pain.  He is able to eat and drink well. No nausea or vomiting.  No tinnitus or peripheral neuropathy. Rest of the pertinent 10 point ROS reviewed  and negative.   MEDICAL HISTORY:  Past Medical History:  Diagnosis Date   Asthma    Cancer ([place])    GERD (gastroesophageal reflux disease)    Sleep apnea    CPAP- does not wear    SURGICAL HISTORY: Past Surgical History:  Procedure Laterality Date   BRONCHIAL NEEDLE ASPIRATION BIOPSY   05/22/2020   Procedure: BRONCHIAL NEEDLE ASPIRATION BIOPSIES;  Surgeon: Garner Nash, DO;  Location: [facility];  Service: Pulmonary;;   DIRECT LARYNGOSCOPY  05/09/2020   Procedure: DIRECT LARYNGOSCOPY;  Surgeon: Rozetta Nunnery, MD;  Location: [facility];  Service: ENT;;   IR GASTROSTOMY TUBE MOD SED  09/13/2020   IR IMAGING GUIDED PORT INSERTION  06/12/2020   MASS EXCISION N/A 05/09/2020   Procedure: EXCISION TONGUE MASS;  Surgeon: Rozetta Nunnery, MD;  Location: [facility];  Service: ENT;  Laterality: N/A;   TONSILLECTOMY     TRACHEOSTOMY TUBE PLACEMENT  05/09/2020   Procedure: TRACHEOSTOMY;  Surgeon: Rozetta Nunnery, MD;  Location: [facility];  Service: ENT;;   VIDEO BRONCHOSCOPY WITH ENDOBRONCHIAL ULTRASOUND N/A 05/22/2020   Procedure: VIDEO BRONCHOSCOPY WITH ENDOBRONCHIAL ULTRASOUND;  Surgeon: Garner Nash, DO;  Location: [facility];  Service: Pulmonary;  Laterality: N/A;    SOCIAL HISTORY: Social History   Socioeconomic History   Marital status: Single    Spouse name: Not on file   Number of children: Not on file   Years of education: Not on file   Highest education level: Not on file  Occupational History   Not on file  Tobacco Use   Smoking status: Never   Smokeless tobacco: Never  Vaping Use   Vaping Use: Never used  Substance and Sexual Activity   Alcohol use: Not Currently    Comment: "4-5 drinks a year for special ocassions" 05/08/20   Drug use: Never   Sexual activity: Not Currently  Other Topics Concern   Not on file  Social History Narrative   Not on file   Social Determinants of Health   Financial Resource Strain: Low Risk    Difficulty of Paying Living Expenses: Not hard at all  Food Insecurity: No Food Insecurity   Worried About Charity fundraiser in the Last Year: Never true   [place] in the Last Year: Never true  Transportation Needs: No Transportation Needs   Lack of Transportation (Medical): No   Lack of Transportation  (Non-Medical): No  Physical Activity: Not on file  Stress: No Stress Concern Present   Feeling of Stress : Not at all  Social Connections: Socially Isolated   Frequency of Communication with Friends and Family: More than three times a week   Frequency of Social Gatherings with Friends and Family: More than three times a week   Attends Religious Services: Never   Marine scientist or Organizations: No   Attends Archivist Meetings: Never   Marital Status: Separated  Intimate Partner Violence: Not on file    FAMILY HISTORY: Family History  Problem Relation Age of Onset   Breast cancer Mother    Cancer - Cervical Mother    Colon cancer Father    Colon cancer Brother     ALLERGIES:  has No Known Allergies.  MEDICATIONS:  Current Outpatient Medications  Medication Sig Dispense Refill   albuterol (PROVENTIL) (2.5 MG/3ML) 0.083% nebulizer solution Take 3 mLs (2.5 mg total) by nebulization every 6 (six) hours as needed for wheezing or shortness of breath.  120 mL 12   albuterol (VENTOLIN HFA) 108 (90 Base) MCG/ACT inhaler Inhale 2 puffs into the lungs as needed for wheezing or shortness of breath.     bacitracin ointment Apply 1 application topically every 8 (eight) hours. Apply around trach wound 120 g 0   cetirizine (ZYRTEC) 10 MG tablet Take 10 mg by mouth daily.     dexamethasone (DECADRON) 4 MG tablet Take 2 tablets (8 mg total) by mouth daily. Take daily x 3 days starting the day after cisplatin chemotherapy. Take with food. 30 tablet 1   fluticasone (FLONASE) 50 MCG/ACT nasal spray Place 2 sprays into both nostrils daily. Place 1 spray each nostril at night 16 g 11   Fluticasone-Salmeterol (ADVAIR) 100-50 MCG/DOSE AEPB Inhale 1 puff into the lungs daily.     lidocaine (XYLOCAINE) 2 % solution Patient: Mix 1part 2% viscous lidocaine, 1part H20. Swish & swallow 48m of diluted mixture, 356m before meals and at bedtime, up to 4 times a day 200 mL 3    lidocaine-prilocaine (EMLA) cream Apply to affected area once as directed 30 g 3   lidocaine-prilocaine (EMLA) cream Apply to affected area once 30 g 3   omeprazole (PRILOSEC) 40 MG capsule Take 40 mg by mouth daily.     ondansetron (ZOFRAN) 8 MG tablet Take 1 tablet (8 mg total) by mouth 2 (two) times daily as needed. Start on the third day after cisplatin chemotherapy. 30 tablet 1   prochlorperazine (COMPAZINE) 10 MG tablet Take 1 tablet (10 mg total) by mouth every 6 (six) hours as needed (Nausea or vomiting). 30 tablet 1   No current facility-administered medications for this visit.   Facility-Administered Medications Ordered in Other Visits  Medication Dose Route Frequency Provider Last Rate Last Admin   heparin lock flush 100 unit/mL  500 Units Intracatheter Once PRN Truman Aceituno, MD       magnesium sulfate 2 GM/50ML IVPB            palonosetron (ALOXI) injection 0.25 mg  0.25 mg Intravenous Once Eldena Dede, MD       sodium chloride flush (NS) 0.9 % injection 10 mL  10 mL Intracatheter PRN IrBenay PikeMD        PHYSICAL EXAMINATION: ECOG PERFORMANCE STATUS: 0 - Asymptomatic  Vitals:   09/27/20 0937  BP: 105/70  Pulse: 72  Resp: 17  Temp: 97.9 F (36.6 C)  SpO2: 100%   Filed Weights   09/27/20 0937  Weight: 280 lb 6.4 oz (127.2 kg)   Physical Exam Constitutional:      Appearance: Normal appearance. He is obese. He is not ill-appearing.  HENT:     Head: Normocephalic and atraumatic.     Mouth/Throat:     Mouth: Mucous membranes are moist.     Pharynx: Oropharynx is clear.  Cardiovascular:     Rate and Rhythm: Normal rate and regular rhythm.     Pulses: Normal pulses.     Heart sounds: Normal heart sounds.  Pulmonary:     Effort: Pulmonary effort is normal.     Breath sounds: Normal breath sounds.  Abdominal:     General: Abdomen is flat. Bowel sounds are normal.     Palpations: Abdomen is soft.  Musculoskeletal:        General: No swelling. Normal  range of motion.     Cervical back: Normal range of motion.  Lymphadenopathy:     Cervical: Cervical adenopathy (Left sided cervical adenopathy continues to improve.)  present.  Skin:    General: Skin is warm and dry.  Neurological:     General: No focal deficit present.     Mental Status: He is alert.  Psychiatric:        Mood and Affect: Mood normal.        Behavior: Behavior normal.   LABORATORY DATA:  I have reviewed the data as listed Lab Results  Component Value Date   WBC 5.3 09/27/2020   HGB 12.2 (L) 09/27/2020   HCT 35.4 (L) 09/27/2020   MCV 95.7 09/27/2020   PLT 200 09/27/2020     Chemistry      Component Value Date/Time   NA 138 09/27/2020 0809   K 4.4 09/27/2020 0809   CL 101 09/27/2020 0809   CO2 28 09/27/2020 0809   BUN 19 09/27/2020 0809   CREATININE 1.09 09/27/2020 0809      Component Value Date/Time   CALCIUM 9.2 09/27/2020 0809   ALKPHOS 53 09/19/2020 0826   AST 17 09/19/2020 0826   ALT 15 09/19/2020 0826   BILITOT 0.6 09/19/2020 0826     Labs reviewed, satisfactory to proceed.  RADIOGRAPHIC STUDIES: I have personally reviewed the radiological images as listed and agreed with the findings in the report. IR Gastrostomy Tube  Result Date: 09/13/2020 INDICATION: Tongue malignancy.  Planned radiation. EXAM: Fluoroscopy guided gastrostomy tube placement. MEDICATIONS: Ancef 2 g IV; Antibiotics were administered within 1 hour of the procedure. Glucagon 1 mg IV ANESTHESIA/SEDATION: Versed 4 mg IV; Fentanyl 100 mcg IV Moderate Sedation Time:  16 minutes The patient was continuously monitored during the procedure by the interventional radiology nurse under my direct supervision. CONTRAST:  15 mL of Omnipaque 300-administered into the gastric lumen. FLUOROSCOPY TIME:  Fluoroscopy Time: 2 minutes 54 seconds (92 mGy). COMPLICATIONS: None immediate. PROCEDURE: Informed written consent was obtained from the patient after a thorough discussion of the procedural risks,  benefits and alternatives. All questions were addressed. Maximal Sterile Barrier Technique was utilized including caps, mask, sterile gowns, sterile gloves, sterile drape, hand hygiene and skin antiseptic. A timeout was performed prior to the initiation of the procedure. An orogastric tube was placed with fluoroscopic guidance. The anterior abdomen was prepped and draped in sterile fashion. Ultrasound evaluation of the left upper quadrant was performed to confirm the position of the liver. The skin and subcutaneous tissues were anesthetized with 1% lidocaine. Single gastropexy was placed using fluoroscopic guidance. 17 gauge needle was directed into the distended stomach with fluoroscopic guidance. A wire was advanced into the stomach. 9-French vascular sheath was placed and the orogastric tube was snared using a [place] snare device. The orogastric tube and snare were pulled out of the patient's mouth. The snare device was connected to a 20-French gastrostomy tube. The snare device and gastrostomy tube were pulled through the patient's mouth and out the anterior abdominal wall. The gastrostomy tube was cut to an appropriate length. The gastropexy was cut just beneath the button. Contrast injection through gastrostomy tube confirmed placement within the stomach. Fluoroscopic images were obtained for documentation. The gastrostomy tube was flushed with normal saline. IMPRESSION: Pull-through percutaneous gastrostomy tube (20 [place]) placed utilizing fluoroscopic guidance. Electronically Signed   By: Miachel Roux M.D.   On: 09/13/2020 16:13    All questions were answered. The patient knows to call the clinic with any problems, questions or concerns. I spent 30 minutes in the care of this patient including review of records, history and physical, counseling and coordination of  care.  We have discussed about lab results today, cisplatin induced adverse effects and follow-up recommendations-supportive measures to  manage mucositis.   Benay Pike, MD 09/27/2020 2:15 PM

## 2020-09-27 NOTE — Assessment & Plan Note (Signed)
This has stabilized since last visit.  He is able to eat and drink.  He also has a G-tube in place if he cannot swallow because of mucositis.  Advised him to start using the G-tube if swallowing becomes painful and he expressed understanding.

## 2020-09-27 NOTE — Assessment & Plan Note (Signed)
  Mild chemotherapy-induced fatigue.  We will continue to monitor.  No additional intervention needed.  We will proceed with full dose cisplatin as planned.

## 2020-09-28 ENCOUNTER — Other Ambulatory Visit: Payer: Self-pay

## 2020-09-28 ENCOUNTER — Ambulatory Visit
Admission: RE | Admit: 2020-09-28 | Discharge: 2020-09-28 | Disposition: A | Discharge status: Home / Self Care | Payer: PRIVATE HEALTH INSURANCE | Source: Ambulatory Visit | Attending: Radiation Oncology | Admitting: Radiation Oncology

## 2020-09-28 DIAGNOSIS — Z510 Encounter for antineoplastic radiation therapy: Secondary | ICD-10-CM | POA: Diagnosis not present

## 2020-09-30 ENCOUNTER — Encounter: Payer: Self-pay | Admitting: Hematology and Oncology

## 2020-10-01 ENCOUNTER — Ambulatory Visit
Admission: RE | Admit: 2020-10-01 | Discharge: 2020-10-01 | Disposition: A | Discharge status: Home / Self Care | Payer: PRIVATE HEALTH INSURANCE | Source: Ambulatory Visit | Attending: Radiation Oncology | Admitting: Radiation Oncology

## 2020-10-01 ENCOUNTER — Other Ambulatory Visit: Payer: Self-pay

## 2020-10-01 ENCOUNTER — Other Ambulatory Visit: Payer: Self-pay | Admitting: Hematology and Oncology

## 2020-10-01 DIAGNOSIS — C01 Malignant neoplasm of base of tongue: Secondary | ICD-10-CM

## 2020-10-01 DIAGNOSIS — Z510 Encounter for antineoplastic radiation therapy: Secondary | ICD-10-CM | POA: Diagnosis not present

## 2020-10-02 ENCOUNTER — Ambulatory Visit
Admission: RE | Admit: 2020-10-02 | Discharge: 2020-10-02 | Disposition: A | Discharge status: Home / Self Care | Payer: PRIVATE HEALTH INSURANCE | Source: Ambulatory Visit | Attending: Radiation Oncology | Admitting: Radiation Oncology

## 2020-10-02 DIAGNOSIS — Z510 Encounter for antineoplastic radiation therapy: Secondary | ICD-10-CM | POA: Diagnosis not present

## 2020-10-02 NOTE — Progress Notes (Signed)
[facility] FOLLOW UP NOTE  Patient Care Team: Pcp, No as PCP - General [place], Stephani Police, RN as Oncology Nurse Navigator Benay Pike, MD as Consulting Physician (Hematology and Oncology) Rozetta Nunnery, MD as Consulting Physician (Otolaryngology) Eppie Gibson, MD as Consulting Physician (Radiation Oncology)   CHIEF COMPLAINTS/PURPOSE OF CONSULTATION:  Pre chemo visit for concurrent cisplatin/radiation. Due for week 3.  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  He completed 4 cycles of 5-FU/carbo/Keytruda for induction.  He had remarkable response and no toxicity to report He was discussed in the head and neck tumor board and the plan was to consider concurrent chemoradiation to the neck and radiation to the mediastinum given oligometastatic disease.  He clearly understands this is not necessarily curative in intent however may give him prolonged progression free interval.   He started weekly cisplatin 09/19/2020 He has been tolerating it very well.  Okay to proceed with week 3 of cisplatin. He will return for follow-up in 1 week.  Intractable hiccups  Will prescribe some baclofen as needed for hiccups. He was advised to avoid it during daytime since this may cause drowsiness.  Thrombocytopenia (HCC) Mild thrombocytopenia likely from chemotherapy.  No indication for transfusion.  Okay to proceed with treatment as planned.  No orders of the defined types were placed in this encounter.   HISTORY OF PRESENTING ILLNESS:   Alexander [name] 60  y.o. 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[place])  05/17/2020 Initial Diagnosis   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[place])   05/18/2020 Cancer Staging   Staging form: Pharynx - HPV-Mediated Oropharynx, AJCC 8th Edition - Clinical stage from 05/18/2020: cT4, cN3, p16+ - Signed by Eppie Gibson, MD on 05/22/2020   06/01/2020 Cancer Staging   Staging form: Pharynx - HPV-Mediated Oropharynx, AJCC 8th Edition - Pathologic stage from 06/01/2020: No Stage Recommended (cT4, cN3, cM1, p16+) - Signed by Benay Pike, MD on 06/01/2020 Stage prefix: Initial diagnosis   06/12/2020 - 08/21/2020 Chemotherapy          09/10/2020 - 09/10/2020 Chemotherapy          09/19/2020 -  Chemotherapy    Patient is on Treatment Plan: HEAD/NECK CISPLATIN Q7D        INTERIM HISTORY  Mr Branstrom is here for a follow up by himself. He received 4 cycles of carboplatin/5-FU and Keytruda for induction.   Given oligometastatic disease and excellent tolerance to induction chemo, we have decided to proceed with consolidation chemoradiation to the neck and palliative radiation to the mediastinum.   He is now on weekly cisplatin with radiation.  Since last visit, no new concerns.  He has been tolerating chemoradiation very well.  No pain.  He is able to eat everything by mouth.  He has not been using the G-tube yet.  He does notice some hiccups from time to time which can last for few hours.  No nausea, vomiting, and change in bowel habits or urinary habits.  No tinnitus, hearing loss or neuropathy. Rest of the pertinent 10 point  ROS reviewed and negative.  MEDICAL HISTORY:  Past Medical History:  Diagnosis Date   Asthma    Cancer ([place])    GERD (gastroesophageal reflux disease)    Sleep apnea    CPAP- does not wear    SURGICAL HISTORY: Past Surgical History:  Procedure Laterality Date   BRONCHIAL NEEDLE ASPIRATION BIOPSY  05/22/2020   Procedure:  BRONCHIAL NEEDLE ASPIRATION BIOPSIES;  Surgeon: Garner Nash, DO;  Location: [facility];  Service: Pulmonary;;   DIRECT LARYNGOSCOPY  05/09/2020   Procedure: DIRECT LARYNGOSCOPY;  Surgeon: Rozetta Nunnery, MD;  Location: [facility];  Service: ENT;;   IR GASTROSTOMY TUBE MOD SED  09/13/2020   IR IMAGING GUIDED PORT INSERTION  06/12/2020   MASS EXCISION N/A 05/09/2020   Procedure: EXCISION TONGUE MASS;  Surgeon: Rozetta Nunnery, MD;  Location: [facility];  Service: ENT;  Laterality: N/A;   TONSILLECTOMY     TRACHEOSTOMY TUBE PLACEMENT  05/09/2020   Procedure: TRACHEOSTOMY;  Surgeon: Rozetta Nunnery, MD;  Location: [facility];  Service: ENT;;   VIDEO BRONCHOSCOPY WITH ENDOBRONCHIAL ULTRASOUND N/A 05/22/2020   Procedure: VIDEO BRONCHOSCOPY WITH ENDOBRONCHIAL ULTRASOUND;  Surgeon: Garner Nash, DO;  Location: [facility];  Service: Pulmonary;  Laterality: N/A;    SOCIAL HISTORY: Social History   Socioeconomic History   Marital status: Single    Spouse name: Not on file   Number of children: Not on file   Years of education: Not on file   Highest education level: Not on file  Occupational History   Not on file  Tobacco Use   Smoking status: Never   Smokeless tobacco: Never  Vaping Use   Vaping Use: Never used  Substance and Sexual Activity   Alcohol use: Not Currently    Comment: "4-5 drinks a year for special ocassions" 05/08/20   Drug use: Never   Sexual activity: Not Currently  Other Topics Concern   Not on file  Social History Narrative   Not on file   Social Determinants of Health   Financial Resource Strain: Low Risk    Difficulty of Paying Living Expenses: Not hard at all  Food Insecurity: No Food Insecurity   Worried About Charity fundraiser in the Last Year: Never true   [place] in the Last Year: Never true  Transportation Needs: No Transportation Needs   Lack of Transportation (Medical): No   Lack of Transportation (Non-Medical): No  Physical  Activity: Not on file  Stress: No Stress Concern Present   Feeling of Stress : Not at all  Social Connections: Socially Isolated   Frequency of Communication with Friends and Family: More than three times a week   Frequency of Social Gatherings with Friends and Family: More than three times a week   Attends Religious Services: Never   Marine scientist or Organizations: No   Attends Archivist Meetings: Never   Marital Status: Separated  Intimate Partner Violence: Not on file    FAMILY HISTORY: Family History  Problem Relation Age of Onset   Breast cancer Mother    Cancer - Cervical Mother    Colon cancer Father    Colon cancer Brother     ALLERGIES:  has No Known Allergies.  MEDICATIONS:  Current Outpatient Medications  Medication Sig Dispense Refill   albuterol (PROVENTIL) (2.5 MG/3ML) 0.083% nebulizer solution Take 3 mLs (2.5 mg total) by nebulization every 6 (six) hours as needed for wheezing or shortness of  breath. 120 mL 12   albuterol (VENTOLIN HFA) 108 (90 Base) MCG/ACT inhaler Inhale 2 puffs into the lungs as needed for wheezing or shortness of breath.     bacitracin ointment Apply 1 application topically every 8 (eight) hours. Apply around trach wound 120 g 0   cetirizine (ZYRTEC) 10 MG tablet Take 10 mg by mouth daily.     dexamethasone (DECADRON) 4 MG tablet Take 2 tablets (8 mg total) by mouth daily. Take daily x 3 days starting the day after cisplatin chemotherapy. Take with food. 30 tablet 1   fluticasone (FLONASE) 50 MCG/ACT nasal spray Place 2 sprays into both nostrils daily. Place 1 spray each nostril at night 16 g 11   Fluticasone-Salmeterol (ADVAIR) 100-50 MCG/DOSE AEPB Inhale 1 puff into the lungs daily.     lidocaine (XYLOCAINE) 2 % solution Patient: Mix 1part 2% viscous lidocaine, 1part H20. Swish & swallow 102m of diluted mixture, 363m before meals and at bedtime, up to 4 times a day 200 mL 3   lidocaine-prilocaine (EMLA) cream Apply to  affected area once as directed 30 g 3   lidocaine-prilocaine (EMLA) cream Apply to affected area once 30 g 3   omeprazole (PRILOSEC) 40 MG capsule Take 40 mg by mouth daily.     ondansetron (ZOFRAN) 8 MG tablet Take 1 tablet (8 mg total) by mouth 2 (two) times daily as needed. Start on the third day after cisplatin chemotherapy. 30 tablet 1   prochlorperazine (COMPAZINE) 10 MG tablet Take 1 tablet (10 mg total) by mouth every 6 (six) hours as needed (Nausea or vomiting). 30 tablet 1   No current facility-administered medications for this visit.   Facility-Administered Medications Ordered in Other Visits  Medication Dose Route Frequency Provider Last Rate Last Admin   magnesium sulfate 2 GM/50ML IVPB             PHYSICAL EXAMINATION: ECOG PERFORMANCE STATUS: 0 - Asymptomatic  Vitals:   10/03/20 0858  BP: (!) 105/59  Pulse: 81  Resp: 18  Temp: (!) 97.1 F (36.2 C)  SpO2: 100%   Filed Weights   10/03/20 0858  Weight: 280 lb (127 kg)   Physical Exam Constitutional:      Appearance: Normal appearance. He is obese. He is not ill-appearing.  HENT:     Head: Normocephalic and atraumatic.     Mouth/Throat:     Mouth: Mucous membranes are moist.     Pharynx: Oropharynx is clear.  Cardiovascular:     Rate and Rhythm: Normal rate and regular rhythm.     Pulses: Normal pulses.     Heart sounds: Normal heart sounds.  Pulmonary:     Effort: Pulmonary effort is normal.     Breath sounds: Normal breath sounds.  Abdominal:     General: Abdomen is flat. Bowel sounds are normal.     Palpations: Abdomen is soft.  Musculoskeletal:        General: No swelling. Normal range of motion.     Cervical back: Normal range of motion.  Lymphadenopathy:     Cervical: Cervical adenopathy (Left sided cervical adenopathy continues to be palpable) present.  Skin:    General: Skin is warm and dry.  Neurological:     General: No focal deficit present.     Mental Status: He is alert.  Psychiatric:         Mood and Affect: Mood normal.        Behavior: Behavior normal.   LABORATORY DATA:  I have reviewed the data as listed Lab Results  Component Value Date   WBC 5.9 10/03/2020   HGB 12.0 (L) 10/03/2020   HCT 34.0 (L) 10/03/2020   MCV 95.8 10/03/2020   PLT 113 (L) 10/03/2020     Chemistry      Component Value Date/Time   NA 141 10/03/2020 0848   K 4.3 10/03/2020 0848   CL 103 10/03/2020 0848   CO2 28 10/03/2020 0848   BUN 21 (H) 10/03/2020 0848   CREATININE 1.07 10/03/2020 0848      Component Value Date/Time   CALCIUM 9.2 10/03/2020 0848   ALKPHOS 53 09/19/2020 0826   AST 17 09/19/2020 0826   ALT 15 09/19/2020 0826   BILITOT 0.6 09/19/2020 0826     Labs reviewed, CBC appears satisfactory to proceed.  RADIOGRAPHIC STUDIES: I have personally reviewed the radiological images as listed and agreed with the findings in the report. IR Gastrostomy Tube  Result Date: 09/13/2020 INDICATION: Tongue malignancy.  Planned radiation. EXAM: Fluoroscopy guided gastrostomy tube placement. MEDICATIONS: Ancef 2 g IV; Antibiotics were administered within 1 hour of the procedure. Glucagon 1 mg IV ANESTHESIA/SEDATION: Versed 4 mg IV; Fentanyl 100 mcg IV Moderate Sedation Time:  16 minutes The patient was continuously monitored during the procedure by the interventional radiology nurse under my direct supervision. CONTRAST:  15 mL of Omnipaque 300-administered into the gastric lumen. FLUOROSCOPY TIME:  Fluoroscopy Time: 2 minutes 54 seconds (92 mGy). COMPLICATIONS: None immediate. PROCEDURE: Informed written consent was obtained from the patient after a thorough discussion of the procedural risks, benefits and alternatives. All questions were addressed. Maximal Sterile Barrier Technique was utilized including caps, mask, sterile gowns, sterile gloves, sterile drape, hand hygiene and skin antiseptic. A timeout was performed prior to the initiation of the procedure. An orogastric tube was placed with  fluoroscopic guidance. The anterior abdomen was prepped and draped in sterile fashion. Ultrasound evaluation of the left upper quadrant was performed to confirm the position of the liver. The skin and subcutaneous tissues were anesthetized with 1% lidocaine. Single gastropexy was placed using fluoroscopic guidance. 17 gauge needle was directed into the distended stomach with fluoroscopic guidance. A wire was advanced into the stomach. 9-French vascular sheath was placed and the orogastric tube was snared using a Gooseneck snare device. The orogastric tube and snare were pulled out of the patient's mouth. The snare device was connected to a 20-French gastrostomy tube. The snare device and gastrostomy tube were pulled through the patient's mouth and out the anterior abdominal wall. The gastrostomy tube was cut to an appropriate length. The gastropexy was cut just beneath the button. Contrast injection through gastrostomy tube confirmed placement within the stomach. Fluoroscopic images were obtained for documentation. The gastrostomy tube was flushed with normal saline. IMPRESSION: Pull-through percutaneous gastrostomy tube (20 [place]) placed utilizing fluoroscopic guidance. Electronically Signed   By: Miachel Roux M.D.   On: 09/13/2020 16:13    All questions were answered. The patient knows to call the clinic with any problems, questions or concerns.    Benay Pike, MD 10/03/2020 9:24 AM

## 2020-10-03 ENCOUNTER — Inpatient Hospital Stay (HOSPITAL_BASED_OUTPATIENT_CLINIC_OR_DEPARTMENT_OTHER): Payer: PRIVATE HEALTH INSURANCE | Admitting: Hematology and Oncology

## 2020-10-03 ENCOUNTER — Inpatient Hospital Stay: Payer: PRIVATE HEALTH INSURANCE

## 2020-10-03 ENCOUNTER — Ambulatory Visit
Admission: RE | Admit: 2020-10-03 | Discharge: 2020-10-03 | Disposition: A | Discharge status: Home / Self Care | Payer: PRIVATE HEALTH INSURANCE | Source: Ambulatory Visit | Attending: Radiation Oncology | Admitting: Radiation Oncology

## 2020-10-03 ENCOUNTER — Encounter: Payer: Self-pay | Admitting: Hematology and Oncology

## 2020-10-03 ENCOUNTER — Ambulatory Visit: Payer: BC Managed Care – PPO | Admitting: Dietician

## 2020-10-03 ENCOUNTER — Other Ambulatory Visit: Payer: Self-pay

## 2020-10-03 DIAGNOSIS — R066 Hiccough: Secondary | ICD-10-CM

## 2020-10-03 DIAGNOSIS — Z510 Encounter for antineoplastic radiation therapy: Secondary | ICD-10-CM | POA: Diagnosis not present

## 2020-10-03 DIAGNOSIS — C029 Malignant neoplasm of tongue, unspecified: Secondary | ICD-10-CM

## 2020-10-03 DIAGNOSIS — C01 Malignant neoplasm of base of tongue: Secondary | ICD-10-CM

## 2020-10-03 DIAGNOSIS — D696 Thrombocytopenia, unspecified: Secondary | ICD-10-CM | POA: Diagnosis not present

## 2020-10-03 DIAGNOSIS — Z95828 Presence of other vascular implants and grafts: Secondary | ICD-10-CM

## 2020-10-03 LAB — CBC WITH DIFFERENTIAL (CANCER CENTER ONLY)
Abs Immature Granulocytes: 0.03 10*3/uL (ref 0.00–0.07)
Basophils Absolute: 0 10*3/uL (ref 0.0–0.1)
Basophils Relative: 0 %
Eosinophils Absolute: 0 10*3/uL (ref 0.0–0.5)
Eosinophils Relative: 0 %
HCT: 34 % — ABNORMAL LOW (ref 39.0–52.0)
Hemoglobin: 12 g/dL — ABNORMAL LOW (ref 13.0–17.0)
Immature Granulocytes: 1 %
Lymphocytes Relative: 4 %
Lymphs Abs: 0.2 10*3/uL — ABNORMAL LOW (ref 0.7–4.0)
MCH: 33.8 pg (ref 26.0–34.0)
MCHC: 35.3 g/dL (ref 30.0–36.0)
MCV: 95.8 fL (ref 80.0–100.0)
Monocytes Absolute: 0.5 10*3/uL (ref 0.1–1.0)
Monocytes Relative: 8 %
Neutro Abs: 5.2 10*3/uL (ref 1.7–7.7)
Neutrophils Relative %: 87 %
Platelet Count: 113 10*3/uL — ABNORMAL LOW (ref 150–400)
RBC: 3.55 MIL/uL — ABNORMAL LOW (ref 4.22–5.81)
RDW: 17.1 % — ABNORMAL HIGH (ref 11.5–15.5)
WBC Count: 5.9 10*3/uL (ref 4.0–10.5)
nRBC: 0 % (ref 0.0–0.2)

## 2020-10-03 LAB — BASIC METABOLIC PANEL - CANCER CENTER ONLY
Anion gap: 10 (ref 5–15)
BUN: 21 mg/dL — ABNORMAL HIGH (ref 6–20)
CO2: 28 mmol/L (ref 22–32)
Calcium: 9.2 mg/dL (ref 8.9–10.3)
Chloride: 103 mmol/L (ref 98–111)
Creatinine: 1.07 mg/dL (ref 0.61–1.24)
GFR, Estimated: >60 mL/min (ref >60)
Glucose, Bld: 105 mg/dL — ABNORMAL HIGH (ref 70–99)
Potassium: 4.3 mmol/L (ref 3.5–5.1)
Sodium: 141 mmol/L (ref 135–145)

## 2020-10-03 LAB — MAGNESIUM: Magnesium: 1.8 mg/dL (ref 1.7–2.4)

## 2020-10-03 MED ORDER — SODIUM CHLORIDE 0.9 % IV SOLN
38.5000 mg/m2 | Freq: Once | INTRAVENOUS | Status: AC
  Administered 2020-10-03: 100 mg via INTRAVENOUS
  Filled 2020-10-03: qty 100

## 2020-10-03 MED ORDER — PALONOSETRON HCL INJECTION 0.25 MG/5ML
0.2500 mg | Freq: Once | INTRAVENOUS | Status: AC
  Administered 2020-10-03: 0.25 mg via INTRAVENOUS

## 2020-10-03 MED ORDER — SODIUM CHLORIDE 0.9 % IV SOLN
10.0000 mg | Freq: Once | INTRAVENOUS | Status: AC
  Administered 2020-10-03: 10 mg via INTRAVENOUS
  Filled 2020-10-03: qty 10

## 2020-10-03 MED ORDER — POTASSIUM CHLORIDE IN NACL 20-0.9 MEQ/L-% IV SOLN
Freq: Once | INTRAVENOUS | Status: AC
  Filled 2020-10-03: qty 1000

## 2020-10-03 MED ORDER — SODIUM CHLORIDE 0.9% FLUSH
10.0000 mL | Freq: Once | INTRAVENOUS | Status: AC
  Administered 2020-10-03: 10 mL

## 2020-10-03 MED ORDER — SODIUM CHLORIDE 0.9% FLUSH
10.0000 mL | INTRAVENOUS | Status: DC | PRN
Start: 2020-10-03 — End: 2020-10-03
  Administered 2020-10-03: 10 mL

## 2020-10-03 MED ORDER — MAGNESIUM SULFATE 2 GM/50ML IV SOLN
2.0000 g | Freq: Once | INTRAVENOUS | Status: AC
  Administered 2020-10-03: 2 g via INTRAVENOUS

## 2020-10-03 MED ORDER — SODIUM CHLORIDE 0.9 % IV SOLN
150.0000 mg | Freq: Once | INTRAVENOUS | Status: AC
  Administered 2020-10-03: 150 mg via INTRAVENOUS
  Filled 2020-10-03: qty 150

## 2020-10-03 MED ORDER — HEPARIN SOD (PORK) LOCK FLUSH 100 UNIT/ML IV SOLN
500.0000 [IU] | Freq: Once | INTRAVENOUS | Status: AC | PRN
  Administered 2020-10-03: 500 [IU]

## 2020-10-03 MED ORDER — BACLOFEN 5 MG PO TABS: 5.0000 mg | ORAL_TABLET | Freq: Three times a day (TID) | ORAL | 0 refills | Status: DC | PRN

## 2020-10-03 MED ORDER — SODIUM CHLORIDE 0.9 % IV SOLN: Freq: Once | INTRAVENOUS | Status: AC

## 2020-10-03 NOTE — Assessment & Plan Note (Signed)
Mild thrombocytopenia likely from chemotherapy.  No indication for transfusion.  Okay to proceed with treatment as planned.

## 2020-10-03 NOTE — Patient Instructions (Signed)
[facility]  Discharge Instructions: Thank you for choosing [facility] to provide your oncology and hematology care.   If you have a lab appointment with the [facility], please go directly to the [facility] and check in at the registration area.   Wear comfortable clothing and clothing appropriate for easy access to any Portacath or PICC line.   We strive to give you quality time with your provider. You may need to reschedule your appointment if you arrive late (15 or more minutes).  Arriving late affects you and other patients whose appointments are after yours.  Also, if you miss three or more appointments without notifying the office, you may be dismissed from the clinic at the provider's discretion.      For prescription refill requests, have your pharmacy contact our office and allow 72 hours for refills to be completed.    Today you received the following chemotherapy and/or immunotherapy agents Cisplatin with pre and post hydration      To help prevent nausea and vomiting after your treatment, we encourage you to take your nausea medication as directed.  BELOW ARE SYMPTOMS THAT SHOULD BE REPORTED IMMEDIATELY: *FEVER GREATER THAN 100.4 F (38 C) OR HIGHER *CHILLS OR SWEATING *NAUSEA AND VOMITING THAT IS NOT CONTROLLED WITH YOUR NAUSEA MEDICATION *UNUSUAL SHORTNESS OF BREATH *UNUSUAL BRUISING OR BLEEDING *URINARY PROBLEMS (pain or burning when urinating, or frequent urination) *BOWEL PROBLEMS (unusual diarrhea, constipation, pain near the anus) TENDERNESS IN MOUTH AND THROAT WITH OR WITHOUT PRESENCE OF ULCERS (sore throat, sores in mouth, or a toothache) UNUSUAL RASH, SWELLING OR PAIN  UNUSUAL VAGINAL DISCHARGE OR ITCHING   Items with * indicate a potential emergency and should be followed up as soon as possible or go to the Emergency Department if any problems should occur.  Please show the CHEMOTHERAPY ALERT CARD or IMMUNOTHERAPY  ALERT CARD at check-in to the Emergency Department and triage nurse.  Should you have questions after your visit or need to cancel or reschedule your appointment, please contact [facility]  Dept: [phone]  and follow the prompts.  Office hours are 8:00 a.m. to 4:30 p.m. Monday - Friday. Please note that voicemails left after 4:00 p.m. may not be returned until the following business day.  We are closed weekends and major holidays. You have access to a nurse at all times for urgent questions. Please call the main number to the clinic Dept: [phone] and follow the prompts.   For any non-urgent questions, you may also contact your provider using MyChart. We now offer e-Visits for anyone 2 and older to request care online for non-urgent symptoms. For details visit [email or web address].   Also download the MyChart app! Go to the app store, search "MyChart", open the app, select [facility], and log in with your MyChart username and password.  Due to Covid, a mask is required upon entering the hospital/clinic. If you do not have a mask, one will be given to you upon arrival. For doctor visits, patients may have 1 support person aged 64 or older with them. For treatment visits, patients cannot have anyone with them due to current Covid guidelines and our immunocompromised population.

## 2020-10-03 NOTE — Progress Notes (Signed)
Nutrition Follow-up:  Patient with tongue cancer. He is receiving concurrent chemoradiation therapy with weekly cisplatin. He is s/p PEG placement on 7/18.  Met with patient in infusion. He denies nutrition impact symptoms, continues to eat well and tolerating regular diet. Patient had eggs, sweet potatoes, spinach this morning for breakfast and enjoyed a steak dinner for his birthday. Patient is not using PEG for feedings at this time, he is flushing tube daily. Patient has not tried CIB for additional calories and protein. Patient reports he will continue to eat normally, but does not want to gain weight with added snacks/supplements.   Medications: Baclofen  Labs: Glucose 105  Anthropometrics: Weight 280 lb today stable  8/11 - 280.4 lb 8/2 - 279.6 lb 7/22 - 282.2 lb  Estimated Energy Needs  Kcals: 8338-2505 Protein: 145-160 Fluid: 3 L  NUTRITION DIAGNOSIS:  Food and nutrition related knowledge deficit improving   INTERVENTION:  Educated on increased energy expenditure during treatment and importance of adequate calories and protein to maintain weight/strength Encouraged high calorie, high protein snacks in between meals Patient will initiate tube feedings if weight declines and unable to maintain adequate calories/protein orally    MONITORING, EVALUATION, GOAL: weight trends, intake   NEXT VISIT: Wednesday August 24 in infusion

## 2020-10-03 NOTE — Assessment & Plan Note (Addendum)
Will prescribe some baclofen as needed for hiccups. He was advised to avoid it during daytime since this may cause drowsiness.

## 2020-10-03 NOTE — Assessment & Plan Note (Signed)
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  He completed 4 cycles of 5-FU/carbo/Keytruda for induction.  He had remarkable response and no toxicity to report He was discussed in the head and neck tumor board and the plan was to consider concurrent chemoradiation to the neck and radiation to the mediastinum given oligometastatic disease.  He clearly understands this is not necessarily curative in intent however may give him prolonged progression free interval.   He started weekly cisplatin 09/19/2020 He has been tolerating it very well.  Okay to proceed with week 3 of cisplatin. He will return for follow-up in 1 week.

## 2020-10-04 ENCOUNTER — Other Ambulatory Visit (HOSPITAL_COMMUNITY): Payer: Self-pay

## 2020-10-04 ENCOUNTER — Other Ambulatory Visit: Payer: Self-pay

## 2020-10-04 ENCOUNTER — Ambulatory Visit
Admission: RE | Admit: 2020-10-04 | Discharge: 2020-10-04 | Disposition: A | Discharge status: Home / Self Care | Payer: PRIVATE HEALTH INSURANCE | Source: Ambulatory Visit | Attending: Radiation Oncology | Admitting: Radiation Oncology

## 2020-10-04 DIAGNOSIS — Z510 Encounter for antineoplastic radiation therapy: Secondary | ICD-10-CM | POA: Diagnosis not present

## 2020-10-04 MED ORDER — BACLOFEN 5 MG PO TABS
5.0000 mg | ORAL_TABLET | Freq: Three times a day (TID) | ORAL | 0 refills | Status: DC | PRN
  Filled 2020-10-04: qty 21, 7d supply, fill #0
  Filled 2020-10-04: qty 9, 3d supply, fill #0

## 2020-10-05 ENCOUNTER — Ambulatory Visit
Admission: RE | Admit: 2020-10-05 | Discharge: 2020-10-05 | Disposition: A | Discharge status: Home / Self Care | Payer: PRIVATE HEALTH INSURANCE | Source: Ambulatory Visit | Attending: Radiation Oncology | Admitting: Radiation Oncology

## 2020-10-05 ENCOUNTER — Other Ambulatory Visit (HOSPITAL_COMMUNITY): Payer: Self-pay

## 2020-10-05 ENCOUNTER — Other Ambulatory Visit: Payer: Self-pay

## 2020-10-05 DIAGNOSIS — Z510 Encounter for antineoplastic radiation therapy: Secondary | ICD-10-CM | POA: Diagnosis not present

## 2020-10-08 ENCOUNTER — Other Ambulatory Visit: Payer: Self-pay

## 2020-10-08 ENCOUNTER — Ambulatory Visit
Admission: RE | Admit: 2020-10-08 | Discharge: 2020-10-08 | Disposition: A | Discharge status: Home / Self Care | Payer: PRIVATE HEALTH INSURANCE | Source: Ambulatory Visit | Attending: Radiation Oncology | Admitting: Radiation Oncology

## 2020-10-08 DIAGNOSIS — Z510 Encounter for antineoplastic radiation therapy: Secondary | ICD-10-CM | POA: Diagnosis not present

## 2020-10-09 ENCOUNTER — Ambulatory Visit
Admission: RE | Admit: 2020-10-09 | Discharge: 2020-10-09 | Disposition: A | Discharge status: Home / Self Care | Payer: PRIVATE HEALTH INSURANCE | Source: Ambulatory Visit | Attending: Radiation Oncology | Admitting: Radiation Oncology

## 2020-10-09 ENCOUNTER — Encounter: Payer: Self-pay | Admitting: Hematology and Oncology

## 2020-10-09 ENCOUNTER — Ambulatory Visit: Payer: PRIVATE HEALTH INSURANCE

## 2020-10-09 DIAGNOSIS — Z510 Encounter for antineoplastic radiation therapy: Secondary | ICD-10-CM | POA: Diagnosis not present

## 2020-10-09 DIAGNOSIS — R1310 Dysphagia, unspecified: Secondary | ICD-10-CM

## 2020-10-09 DIAGNOSIS — R293 Abnormal posture: Secondary | ICD-10-CM | POA: Diagnosis not present

## 2020-10-09 NOTE — Therapy (Signed)
[facility] [address] [place], [place], [address] Phone: [phone]   Fax:  [phone]  Speech Language Pathology Treatment  Patient Details  Name: Alexander [name] MRN: [id number] Date of Birth: 02/07/1961 Referring Provider (SLP): Eppie Gibson   Encounter Date: 10/09/2020   End of Session - 10/09/20 1018     Visit Number 2    Number of Visits 4    Date for SLP Re-Evaluation 12/19/20    SLP Start Time 1015    SLP Stop Time  1052    SLP Time Calculation (min) 37 min    Activity Tolerance Patient tolerated treatment well             Past Medical History:  Diagnosis Date   Asthma    Cancer ([place])    GERD (gastroesophageal reflux disease)    Sleep apnea    CPAP- does not wear    Past Surgical History:  Procedure Laterality Date   BRONCHIAL NEEDLE ASPIRATION BIOPSY  05/22/2020   Procedure: BRONCHIAL NEEDLE ASPIRATION BIOPSIES;  Surgeon: Garner Nash, DO;  Location: [facility];  Service: Pulmonary;;   DIRECT LARYNGOSCOPY  05/09/2020   Procedure: DIRECT LARYNGOSCOPY;  Surgeon: Rozetta Nunnery, MD;  Location: [facility];  Service: ENT;;   IR GASTROSTOMY TUBE MOD SED  09/13/2020   IR IMAGING GUIDED PORT INSERTION  06/12/2020   MASS EXCISION N/A 05/09/2020   Procedure: EXCISION TONGUE MASS;  Surgeon: Rozetta Nunnery, MD;  Location: [facility];  Service: ENT;  Laterality: N/A;   TONSILLECTOMY     TRACHEOSTOMY TUBE PLACEMENT  05/09/2020   Procedure: TRACHEOSTOMY;  Surgeon: Rozetta Nunnery, MD;  Location: [facility];  Service: ENT;;   VIDEO BRONCHOSCOPY WITH ENDOBRONCHIAL ULTRASOUND N/A 05/22/2020   Procedure: VIDEO BRONCHOSCOPY WITH ENDOBRONCHIAL ULTRASOUND;  Surgeon: Garner Nash, DO;  Location: [facility];  Service: Pulmonary;  Laterality: N/A;    There were no vitals filed for this visit.   Subjective Assessment - 10/09/20 0942     Subjective "I told Dr. Isidore Moos when the food goes down I kind of  feel it a litle."    Patient is accompained by: --   SO   Currently in Pain? No/denies                   ADULT SLP TREATMENT - 10/09/20 0945       General Information   Behavior/Cognition Alert;Cooperative;Pleasant mood      Treatment Provided   Treatment provided Dysphagia      Dysphagia Treatment   Temperature Spikes Noted No    Oral Cavity - Dentition Adequate natural dentition    Treatment Methods Skilled observation;Therapeutic exercise;Patient/caregiver education    Patient observed directly with PO's Yes    Type of PO's observed Dysphagia 3 (soft);Thin liquids    Liquids provided via Cup    Oral Phase Signs & Symptoms Other (comment)   none noted today   Pharyngeal Phase Signs & Symptoms Other (comment)   none noted today   Other treatment/comments Pt completed HEP with rare minA - stated he hasn't completed HEP to recommended frequency in last two weeks. He told SLP rationale for HEP with independence. Reminded pt of pain meds and then completing HEP and eating, if pain was an issue in the next 4 weeks.      Cognitive-Linquistic Treatment   Skilled Treatment "I don't get to do them as much as I want" (re: HEP). "The radiation zaps  my energy."      Assessment / Recommendations / Plan   Plan Continue with current plan of care      Progression Toward Goals   Progression toward goals Progressing toward goals              SLP Education - 10/09/20 1017     Education Details need frequency of HEP BID, procedure for HEP    Person(s) Educated Patient;Caregiver(s)    Methods Explanation;Demonstration;Verbal cues    Comprehension Verbalized understanding;Returned demonstration;Verbal cues required              SLP SHORT TERM GOAL #1     Title pt will complete HEP with rare min A    Time 1    Period --   vists, for all STGs    Status Ongoing           SLP SHORT TERM GOAL #2    Title pt will tell SLP why pt is completing HEP with modified independence     Time 1    Status Achieved           SLP SHORT TERM GOAL #3    Title pt will describe 3 overt s/s aspiration PNA with modified independence    Time 2    Status Ongoing           SLP SHORT TERM GOAL #4    Title pt will tell SLP how a food journal could hasten return to a more normalized diet    Time 2    Status Ongoing                     SLP Long Term Goals - 10/09/20 0912                     SLP LONG TERM GOAL #1    Title pt will complete HEP with modified independence over two visits    Time 3    Period --   visits, for all LTGs    Status Ongoing           SLP LONG TERM GOAL #2    Title pt will describe how to modify HEP over time, and the timeline associated with reduction in HEP frequency with modified independence over two sessions    Time 4    Status  Ongoing                    Plan - 008/23/22 0910       Clinical Impression Statement At this time pt swallowing is deemed WNL/WFL with dys III/thin liquids. There are no overt s/s aspiration reported by pt at this time with his POs at home; no overt s/sx aspiration PNA reported by pt or observed by SLP today. SLP reviewed an individualized HEP for dysphagia and pt completed each exercise on their own with consistent min-mod cues faded to modified independent. Data indicate that pt's swallow ability will likely decrease over the course of radiation/chemotherapy and could very well decline over time following conclusion of their radiation therapy due to muscle disuse atrophy and/or muscle fibrosis. Pt will cont to need to be seen by SLP in order to assess safety of PO intake, assess the need for recommending any objective swallow assessment, and ensuring pt correctly completes the individualized HEP.    Speech Therapy Frequency --   once approx every four weeks    Duration --   90 days  for this reporting period; overall, approx 7 visits    Treatment/Interventions Aspiration precaution training;Pharyngeal strengthening  exercises;Diet toleration management by SLP;Trials of upgraded texture/liquids;Patient/family education;SLP instruction and feedback;Compensatory techniques    Potential to Achieve Goals Good    SLP Home Exercise Plan provided    Consulted and Agree with Plan of Care            Patient will benefit from skilled therapeutic intervention in order to improve the following deficits and impairments:   Dysphagia, unspecified type    Problem List Patient Active Problem List   Diagnosis Date Noted   Port-A-Cath in place 10/03/2020   Intractable hiccups 10/03/2020   Thrombocytopenia (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 [place], [place], [address] Phone: [phone]   Fax:  [phone]   Name: Alexander [name] MRN: [id number] Date of Birth: August 09, 1960

## 2020-10-09 NOTE — Progress Notes (Signed)
Patient called to regarding Pharmacologist previously discussed. Advised what is needed to apply.   Patient will bring on 10/10/20 to complete grant process.  He has my card for any additional financial questions or concerns.

## 2020-10-10 ENCOUNTER — Encounter: Payer: Self-pay | Admitting: Hematology and Oncology

## 2020-10-10 ENCOUNTER — Inpatient Hospital Stay: Payer: PRIVATE HEALTH INSURANCE

## 2020-10-10 ENCOUNTER — Inpatient Hospital Stay: Payer: PRIVATE HEALTH INSURANCE | Admitting: Dietician

## 2020-10-10 ENCOUNTER — Ambulatory Visit
Admission: RE | Admit: 2020-10-10 | Discharge: 2020-10-10 | Disposition: A | Discharge status: Home / Self Care | Payer: PRIVATE HEALTH INSURANCE | Source: Ambulatory Visit | Attending: Radiation Oncology | Admitting: Radiation Oncology

## 2020-10-10 ENCOUNTER — Other Ambulatory Visit: Payer: Self-pay

## 2020-10-10 ENCOUNTER — Inpatient Hospital Stay (HOSPITAL_BASED_OUTPATIENT_CLINIC_OR_DEPARTMENT_OTHER): Payer: PRIVATE HEALTH INSURANCE | Admitting: Hematology and Oncology

## 2020-10-10 DIAGNOSIS — C01 Malignant neoplasm of base of tongue: Secondary | ICD-10-CM

## 2020-10-10 DIAGNOSIS — D696 Thrombocytopenia, unspecified: Secondary | ICD-10-CM

## 2020-10-10 DIAGNOSIS — R066 Hiccough: Secondary | ICD-10-CM

## 2020-10-10 DIAGNOSIS — T451X5A Adverse effect of antineoplastic and immunosuppressive drugs, initial encounter: Secondary | ICD-10-CM

## 2020-10-10 DIAGNOSIS — R5383 Other fatigue: Secondary | ICD-10-CM | POA: Diagnosis not present

## 2020-10-10 DIAGNOSIS — Z95828 Presence of other vascular implants and grafts: Secondary | ICD-10-CM

## 2020-10-10 DIAGNOSIS — Z510 Encounter for antineoplastic radiation therapy: Secondary | ICD-10-CM | POA: Diagnosis not present

## 2020-10-10 LAB — CBC WITH DIFFERENTIAL (CANCER CENTER ONLY)
Abs Immature Granulocytes: 0.01 10*3/uL (ref 0.00–0.07)
Basophils Absolute: 0 10*3/uL (ref 0.0–0.1)
Basophils Relative: 0 %
Eosinophils Absolute: 0 10*3/uL (ref 0.0–0.5)
Eosinophils Relative: 0 %
HCT: 33.5 % — ABNORMAL LOW (ref 39.0–52.0)
Hemoglobin: 11.5 g/dL — ABNORMAL LOW (ref 13.0–17.0)
Immature Granulocytes: 0 %
Lymphocytes Relative: 4 %
Lymphs Abs: 0.2 10*3/uL — ABNORMAL LOW (ref 0.7–4.0)
MCH: 33.8 pg (ref 26.0–34.0)
MCHC: 34.3 g/dL (ref 30.0–36.0)
MCV: 98.5 fL (ref 80.0–100.0)
Monocytes Absolute: 0.3 10*3/uL (ref 0.1–1.0)
Monocytes Relative: 7 %
Neutro Abs: 3.9 10*3/uL (ref 1.7–7.7)
Neutrophils Relative %: 89 %
Platelet Count: 94 10*3/uL — ABNORMAL LOW (ref 150–400)
RBC: 3.4 MIL/uL — ABNORMAL LOW (ref 4.22–5.81)
RDW: 16.8 % — ABNORMAL HIGH (ref 11.5–15.5)
WBC Count: 4.4 10*3/uL (ref 4.0–10.5)
nRBC: 0 % (ref 0.0–0.2)

## 2020-10-10 LAB — BASIC METABOLIC PANEL - CANCER CENTER ONLY
Anion gap: 7 (ref 5–15)
BUN: 19 mg/dL (ref 6–20)
CO2: 28 mmol/L (ref 22–32)
Calcium: 9.2 mg/dL (ref 8.9–10.3)
Chloride: 105 mmol/L (ref 98–111)
Creatinine: 1.04 mg/dL (ref 0.61–1.24)
GFR, Estimated: >60 mL/min (ref >60)
Glucose, Bld: 79 mg/dL (ref 70–99)
Potassium: 4.4 mmol/L (ref 3.5–5.1)
Sodium: 140 mmol/L (ref 135–145)

## 2020-10-10 LAB — MAGNESIUM: Magnesium: 1.9 mg/dL (ref 1.7–2.4)

## 2020-10-10 MED ORDER — POTASSIUM CHLORIDE IN NACL 20-0.9 MEQ/L-% IV SOLN
Freq: Once | INTRAVENOUS | Status: AC
  Filled 2020-10-10: qty 1000

## 2020-10-10 MED ORDER — PALONOSETRON HCL INJECTION 0.25 MG/5ML
0.2500 mg | Freq: Once | INTRAVENOUS | Status: AC
  Administered 2020-10-10: 0.25 mg via INTRAVENOUS
  Filled 2020-10-10: qty 5

## 2020-10-10 MED ORDER — SODIUM CHLORIDE 0.9 % IV SOLN: Freq: Once | INTRAVENOUS | Status: AC

## 2020-10-10 MED ORDER — SODIUM CHLORIDE 0.9% FLUSH
10.0000 mL | Freq: Once | INTRAVENOUS | Status: AC
Start: 2020-10-10 — End: 2020-10-10
  Administered 2020-10-10: 10 mL

## 2020-10-10 MED ORDER — SODIUM CHLORIDE 0.9% FLUSH
10.0000 mL | INTRAVENOUS | Status: DC | PRN
  Administered 2020-10-10: 10 mL

## 2020-10-10 MED ORDER — FOSAPREPITANT DIMEGLUMINE INJECTION 150 MG
150.0000 mg | Freq: Once | INTRAVENOUS | Status: AC
Start: 2020-10-10 — End: 2020-10-10
  Administered 2020-10-10: 150 mg via INTRAVENOUS
  Filled 2020-10-10: qty 150

## 2020-10-10 MED ORDER — MAGNESIUM SULFATE 2 GM/50ML IV SOLN
2.0000 g | Freq: Once | INTRAVENOUS | Status: AC
  Administered 2020-10-10: 2 g via INTRAVENOUS
  Filled 2020-10-10: qty 50

## 2020-10-10 MED ORDER — SODIUM CHLORIDE 0.9 % IV SOLN
10.0000 mg | Freq: Once | INTRAVENOUS | Status: AC
  Administered 2020-10-10: 10 mg via INTRAVENOUS
  Filled 2020-10-10: qty 10

## 2020-10-10 MED ORDER — SODIUM CHLORIDE 0.9 % IV SOLN
38.5000 mg/m2 | Freq: Once | INTRAVENOUS | Status: AC
  Administered 2020-10-10: 100 mg via INTRAVENOUS
  Filled 2020-10-10: qty 100

## 2020-10-10 MED ORDER — HEPARIN SOD (PORK) LOCK FLUSH 100 UNIT/ML IV SOLN
500.0000 [IU] | Freq: Once | INTRAVENOUS | Status: AC | PRN
  Administered 2020-10-10: 500 [IU]

## 2020-10-10 NOTE — Progress Notes (Signed)
[facility] FOLLOW UP NOTE  Patient Care Team: Pcp, No as PCP - General [place], Stephani Police, RN as Oncology Nurse Navigator Benay Pike, MD as Consulting Physician (Hematology and Oncology) Rozetta Nunnery, MD as Consulting Physician (Otolaryngology) Eppie Gibson, MD as Consulting Physician (Radiation Oncology)   CHIEF COMPLAINTS/PURPOSE OF CONSULTATION:   Pre chemo visit for concurrent cisplatin/radiation. Due for week 4, pre chemo visit.  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  He completed 4 cycles of 5-FU/carbo/Keytruda for induction.  He had remarkable response and no toxicity to report He was discussed in the head and neck tumor board and the plan was to consider concurrent chemoradiation to the neck and radiation to the mediastinum given oligometastatic disease.  He clearly understands this is not necessarily curative in intent however may give him prolonged progression free interval.   He started weekly cisplatin 09/19/2020 He has been tolerating it very well.  Okay to proceed with week 4 of cisplatin. No concerns on PE CBC reviewed, mild thrombocytopenia, ok to proceed. Rest of the labs ok and in parameters. RTC in one week as planned  Thrombocytopenia (HCC) Cisplatin related. Mild, Ok to proceed with chemo We will continue to watch it closely  Intractable hiccups Currently resolved PRN baclofen prescribed.  Chemotherapy-induced fatigue Mild, encouraged adequate hydration, nutrition and sleep. Will continue to  monitor.  No orders of the defined types were placed in this encounter.   HISTORY OF PRESENTING ILLNESS:   Alexander [name] 60 y.o. 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[place] [facility])  05/17/2020 Initial Diagnosis   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[facility])   05/18/2020 Cancer Staging   Staging form: Pharynx - HPV-Mediated Oropharynx, AJCC 8th Edition - Clinical stage from 05/18/2020: cT4, cN3, p16+ - Signed by Eppie Gibson, MD on 05/22/2020   06/01/2020 Cancer Staging   Staging form: Pharynx - HPV-Mediated Oropharynx, AJCC 8th Edition - Pathologic stage from 06/01/2020: No Stage Recommended (cT4, cN3, cM1, p16+) - Signed by Benay Pike, MD on 06/01/2020 Stage prefix: Initial diagnosis   06/12/2020 - 08/21/2020 Chemotherapy          09/10/2020 - 09/10/2020 Chemotherapy          09/19/2020 -  Chemotherapy    Patient is on Treatment Plan: HEAD/NECK CISPLATIN Q7D        INTERIM HISTORY  Alexander [name] is here for a follow up by himself. He received 4 cycles of carboplatin/5-FU and Keytruda for induction.   Given oligometastatic disease and excellent tolerance to induction chemo, we have decided to proceed with consolidation chemoradiation to the neck and palliative radiation to the mediastinum.   He is now on weekly cisplatin with radiation.   Since last visit, voice is feeble and hoarse. Energy is ok, still active, eating by mouth. Sleeps more. No mouth sores, swallowing is a little harder. No weight changes No change in bowel habits. No nausea, vomiting, hearing. Mild tingling in hands and feet. Urinating well, amber colored. Rest of the 10 point ROS reviewed and neg.  MEDICAL HISTORY:  Past Medical History:  Diagnosis Date   Asthma    Cancer ([place])    GERD (gastroesophageal reflux disease)    Sleep apnea    CPAP- does not wear    SURGICAL HISTORY: Past Surgical History:  Procedure Laterality Date    BRONCHIAL NEEDLE ASPIRATION BIOPSY  05/22/2020   Procedure: BRONCHIAL NEEDLE ASPIRATION BIOPSIES;  Surgeon: Garner Nash, DO;  Location: [facility];  Service: Pulmonary;;   DIRECT LARYNGOSCOPY  05/09/2020   Procedure: DIRECT LARYNGOSCOPY;  Surgeon: Rozetta Nunnery, MD;  Location: [facility];  Service: ENT;;   IR GASTROSTOMY TUBE MOD SED  09/13/2020   IR IMAGING GUIDED PORT INSERTION  06/12/2020   MASS EXCISION N/A 05/09/2020   Procedure: EXCISION TONGUE MASS;  Surgeon: Rozetta Nunnery, MD;  Location: [facility];  Service: ENT;  Laterality: N/A;   TONSILLECTOMY     TRACHEOSTOMY TUBE PLACEMENT  05/09/2020   Procedure: TRACHEOSTOMY;  Surgeon: Rozetta Nunnery, MD;  Location: [facility];  Service: ENT;;   VIDEO BRONCHOSCOPY WITH ENDOBRONCHIAL ULTRASOUND N/A 05/22/2020   Procedure: VIDEO BRONCHOSCOPY WITH ENDOBRONCHIAL ULTRASOUND;  Surgeon: Garner Nash, DO;  Location: [facility];  Service: Pulmonary;  Laterality: N/A;    SOCIAL HISTORY: Social History   Socioeconomic History   Marital status: Single    Spouse name: Not on file   Number of children: Not on file   Years of education: Not on file   Highest education level: Not on file  Occupational History   Not on file  Tobacco Use   Smoking status: Never   Smokeless tobacco: Never  Vaping Use   Vaping Use: Never used  Substance and Sexual Activity   Alcohol use: Not Currently    Comment: "4-5 drinks a year for special ocassions" 05/08/20   Drug use: Never   Sexual activity: Not Currently  Other Topics Concern   Not on file  Social History Narrative   Not on file   Social Determinants of Health   Financial Resource Strain: Low Risk    Difficulty of Paying Living Expenses: Not hard at all  Food Insecurity: No Food Insecurity   Worried About Charity fundraiser in the Last Year: Never true   [place] in the Last Year: Never true  Transportation Needs: No Transportation Needs   Lack of Transportation (Medical): No    Lack of Transportation (Non-Medical): No  Physical Activity: Not on file  Stress: No Stress Concern Present   Feeling of Stress : Not at all  Social Connections: Socially Isolated   Frequency of Communication with Friends and Family: More than three times a week   Frequency of Social Gatherings with Friends and Family: More than three times a week   Attends Religious Services: Never   Marine scientist or Organizations: No   Attends Archivist Meetings: Never   Marital Status: Separated  Intimate Partner Violence: Not on file    FAMILY HISTORY: Family History  Problem Relation Age of Onset   Breast cancer Mother    Cancer - Cervical Mother    Colon cancer Father    Colon cancer Brother     ALLERGIES:  has No Known Allergies.  MEDICATIONS:  Current Outpatient Medications  Medication Sig Dispense Refill   albuterol (PROVENTIL) (2.5 MG/3ML) 0.083% nebulizer solution Take 3 mLs (2.5 mg total) by nebulization every 6 (six) hours as needed for wheezing or shortness of breath. 120 mL 12  albuterol (VENTOLIN HFA) 108 (90 Base) MCG/ACT inhaler Inhale 2 puffs into the lungs as needed for wheezing or shortness of breath.     bacitracin ointment Apply 1 application topically every 8 (eight) hours. Apply around trach wound 120 g 0   Baclofen 5 MG TABS Take 1 tablet by mouth 3 (three) times daily as needed. 30 tablet 0   cetirizine (ZYRTEC) 10 MG tablet Take 10 mg by mouth daily.     dexamethasone (DECADRON) 4 MG tablet Take 2 tablets (8 mg total) by mouth daily. Take daily x 3 days starting the day after cisplatin chemotherapy. Take with food. 30 tablet 1   fluticasone (FLONASE) 50 MCG/ACT nasal spray Place 2 sprays into both nostrils daily. Place 1 spray each nostril at night 16 g 11   Fluticasone-Salmeterol (ADVAIR) 100-50 MCG/DOSE AEPB Inhale 1 puff into the lungs daily.     lidocaine (XYLOCAINE) 2 % solution Patient: Mix 1part 2% viscous lidocaine, 1part H20. Swish &  swallow 11m of diluted mixture, 336m before meals and at bedtime, up to 4 times a day 200 mL 3   lidocaine-prilocaine (EMLA) cream Apply to affected area once as directed 30 g 3   lidocaine-prilocaine (EMLA) cream Apply to affected area once 30 g 3   omeprazole (PRILOSEC) 40 MG capsule Take 40 mg by mouth daily.     ondansetron (ZOFRAN) 8 MG tablet Take 1 tablet (8 mg total) by mouth 2 (two) times daily as needed. Start on the third day after cisplatin chemotherapy. 30 tablet 1   prochlorperazine (COMPAZINE) 10 MG tablet Take 1 tablet (10 mg total) by mouth every 6 (six) hours as needed (Nausea or vomiting). 30 tablet 1   No current facility-administered medications for this visit.   Facility-Administered Medications Ordered in Other Visits  Medication Dose Route Frequency Provider Last Rate Last Admin   magnesium sulfate 2 GM/50ML IVPB             PHYSICAL EXAMINATION: ECOG PERFORMANCE STATUS: 0 - Asymptomatic  Vitals:   10/10/20 0915  BP: 103/69  Pulse: 75  Resp: 18  Temp: 98.4 F (36.9 C)  SpO2: 98%   Filed Weights   10/10/20 0915  Weight: 279 lb 1.6 oz (126.6 kg)   Physical Exam Constitutional:      Appearance: Normal appearance. He is obese. He is not ill-appearing.  HENT:     Head: Normocephalic and atraumatic.     Mouth/Throat:     Mouth: Mucous membranes are moist.     Pharynx: Oropharynx is clear. Posterior oropharyngeal erythema present.  Cardiovascular:     Rate and Rhythm: Normal rate and regular rhythm.     Pulses: Normal pulses.     Heart sounds: Normal heart sounds.  Pulmonary:     Effort: Pulmonary effort is normal.     Breath sounds: Normal breath sounds.  Abdominal:     General: Abdomen is flat. Bowel sounds are normal.     Palpations: Abdomen is soft.  Musculoskeletal:        General: No swelling. Normal range of motion.     Cervical back: Normal range of motion.  Lymphadenopathy:     Cervical: Cervical adenopathy (Left sided cervical  adenopathy improved compared to last visit) present.  Skin:    General: Skin is warm and dry.  Neurological:     General: No focal deficit present.     Mental Status: He is alert.  Psychiatric:  Mood and Affect: Mood normal.        Behavior: Behavior normal.   LABORATORY DATA:  I have reviewed the data as listed Lab Results  Component Value Date   WBC 4.4 10/10/2020   HGB 11.5 (L) 10/10/2020   HCT 33.5 (L) 10/10/2020   MCV 98.5 10/10/2020   PLT 94 (L) 10/10/2020     Chemistry      Component Value Date/Time   NA 140 10/10/2020 0850   K 4.4 10/10/2020 0850   CL 105 10/10/2020 0850   CO2 28 10/10/2020 0850   BUN 19 10/10/2020 0850   CREATININE 1.04 10/10/2020 0850      Component Value Date/Time   CALCIUM 9.2 10/10/2020 0850   ALKPHOS 53 09/19/2020 0826   AST 17 09/19/2020 0826   ALT 15 09/19/2020 0826   BILITOT 0.6 09/19/2020 0826     Labs reviewed, CBC appears satisfactory to proceed.  RADIOGRAPHIC STUDIES: I have personally reviewed the radiological images as listed and agreed with the findings in the report. IR Gastrostomy Tube  Result Date: 09/13/2020 INDICATION: Tongue malignancy.  Planned radiation. EXAM: Fluoroscopy guided gastrostomy tube placement. MEDICATIONS: Ancef 2 g IV; Antibiotics were administered within 1 hour of the procedure. Glucagon 1 mg IV ANESTHESIA/SEDATION: Versed 4 mg IV; Fentanyl 100 mcg IV Moderate Sedation Time:  16 minutes The patient was continuously monitored during the procedure by the interventional radiology nurse under my direct supervision. CONTRAST:  15 mL of Omnipaque 300-administered into the gastric lumen. FLUOROSCOPY TIME:  Fluoroscopy Time: 2 minutes 54 seconds (92 mGy). COMPLICATIONS: None immediate. PROCEDURE: Informed written consent was obtained from the patient after a thorough discussion of the procedural risks, benefits and alternatives. All questions were addressed. Maximal Sterile Barrier Technique was utilized  including caps, mask, sterile gowns, sterile gloves, sterile drape, hand hygiene and skin antiseptic. A timeout was performed prior to the initiation of the procedure. An orogastric tube was placed with fluoroscopic guidance. The anterior abdomen was prepped and draped in sterile fashion. Ultrasound evaluation of the left upper quadrant was performed to confirm the position of the liver. The skin and subcutaneous tissues were anesthetized with 1% lidocaine. Single gastropexy was placed using fluoroscopic guidance. 17 gauge needle was directed into the distended stomach with fluoroscopic guidance. A wire was advanced into the stomach. 9-French vascular sheath was placed and the orogastric tube was snared using a Goos[place]ck snare device. The orogastric tube and snare were pulled out of the patient's mouth. The snare device was connected to a 20-French gastrostomy tube. The snare device and gastrostomy tube were pulled through the patient's mouth and out the anterior abdominal wall. The gastrostomy tube was cut to an appropriate length. The gastropexy was cut just beneath the button. Contrast injection through gastrostomy tube confirmed placement within the stomach. Fluoroscopic images were obtained for documentation. The gastrostomy tube was flushed with normal saline. IMPRESSION: Pull-through percutaneous gastrostomy tube (20 [place]) placed utilizing fluoroscopic guidance. Electronically Signed   By: Miachel Roux M.D.   On: 09/13/2020 16:13    All questions were answered. The patient knows to call the clinic with any problems, questions or concerns.    Benay Pike, MD 10/10/2020 9:44 AM

## 2020-10-10 NOTE — Progress Notes (Signed)
Nutrition Follow-up:  Patient with tongue cancer. He , receiving concurrent chemoradiation therapy with weekly cisplatin. He is s/p PEG placement on 7/18  Met with patient in during infusion. He reports his appetite is good, but food has no taste. Patient reports burning in throat with soda and is no longer drinking. Patient reports drinking fruit juice and a lot of water, unsure of how much. He continues to tolerate regular textures without difficulty. Yesterday patient had scrambled eggs with cheese, sausage, bacon for breakfast, double cheeseburger for lunch, steak, mac/cheese for dinner. He denies nausea, vomiting, diarrhea, constipation. Patient is not using feeding tube. He is flushing tube with water daily.   Medications: reviewed  Labs: reviewed  Anthropometrics: Weight 279 lb 1.6 oz today stable  8/17 - 280 lb 8/11 - 279.6 lb 7/22 - 282.2 lb  Estimated Energy Needs  Kcals: 9892-1194 Protein: 145-160 Fluid: 3 L  NUTRITION DIAGNOSIS: Food and nutrition related knowledge deficit improved   INTERVENTION:  Continue eating high calorie, high protein foods orally as able Encouraged drinking nutrition supplement for added calories and protein given, recommend 1-2 Ensure Plus/equivalent daily Educated on importance of completing HEP exercises per SLP recommendations Discussed strategies for dry mouth, pt has handout Patient will initiate tube feedings if weight declines and unable to maintain adequate calories and protein orally    MONITORING, EVALUATION, GOAL: weight trends, intake   NEXT VISIT: Wednesday August 31 in infusion

## 2020-10-10 NOTE — Assessment & Plan Note (Signed)
Cisplatin related. Mild, Ok to proceed with chemo We will continue to watch it closely

## 2020-10-10 NOTE — Assessment & Plan Note (Addendum)
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  He completed 4 cycles of 5-FU/carbo/Keytruda for induction.  He had remarkable response and no toxicity to report He was discussed in the head and neck tumor board and the plan was to consider concurrent chemoradiation to the neck and radiation to the mediastinum given oligometastatic disease.  He clearly understands this is not necessarily curative in intent however may give him prolonged progression free interval.   He started weekly cisplatin 09/19/2020 He has been tolerating it very well.  Okay to proceed with week 4 of cisplatin. No concerns on PE CBC reviewed, mild thrombocytopenia, ok to proceed. Rest of the labs ok and in parameters. RTC in one week as planned

## 2020-10-10 NOTE — Assessment & Plan Note (Signed)
Currently resolved PRN baclofen prescribed.

## 2020-10-10 NOTE — Assessment & Plan Note (Signed)
Mild, encouraged adequate hydration, nutrition and sleep. Will continue to monitor.

## 2020-10-10 NOTE — Patient Instructions (Signed)
[facility]ICAL [facility]  Discharge Instructions: Thank you for choosing [facility] to provide your oncology and hematology care.   If you have a lab appointment with the Cancer Center, please go directly to the Cancer Center and check in at the registration area.   Wear comfortable clothing and clothing appropriate for easy access to any Portacath or PICC line.   We strive to give you quality time with your provider. You may need to reschedule your appointment if you arrive late (15 or more minutes).  Arriving late affects you and other patients whose appointments are after yours.  Also, if you miss three or more appointments without notifying the office, you may be dismissed from the clinic at the provider's discretion.      For prescription refill requests, have your pharmacy contact our office and allow 72 hours for refills to be completed.    Today you received the following chemotherapy and/or immunotherapy agents cisplatin   To help prevent nausea and vomiting after your treatment, we encourage you to take your nausea medication as directed.  BELOW ARE SYMPTOMS THAT SHOULD BE REPORTED IMMEDIATELY: *FEVER GREATER THAN 100.4 F (38 C) OR HIGHER *CHILLS OR SWEATING *NAUSEA AND VOMITING THAT IS NOT CONTROLLED WITH YOUR NAUSEA MEDICATION *UNUSUAL SHORTNESS OF BREATH *UNUSUAL BRUISING OR BLEEDING *URINARY PROBLEMS (pain or burning when urinating, or frequent urination) *BOWEL PROBLEMS (unusual diarrhea, constipation, pain near the anus) TENDERNESS IN MOUTH AND THROAT WITH OR WITHOUT PRESENCE OF ULCERS (sore throat, sores in mouth, or a toothache) UNUSUAL RASH, SWELLING OR PAIN  UNUSUAL VAGINAL DISCHARGE OR ITCHING   Items with * indicate a potential emergency and should be followed up as soon as possible or go to the Emergency Department if any problems should occur.  Please show the CHEMOTHERAPY ALERT CARD or IMMUNOTHERAPY ALERT CARD at check-in to the  Emergency Department and triage nurse.  Should you have questions after your visit or need to cancel or reschedule your appointment, please contact [facility]  Dept: [phone]  and follow the prompts.  Office hours are 8:00 a.m. to 4:30 p.m. Monday - Friday. Please note that voicemails left after 4:00 p.m. may not be returned until the following business day.  We are closed weekends and major holidays. You have access to a nurse at all times for urgent questions. Please call the main number to the clinic Dept: [phone] and follow the prompts.   For any non-urgent questions, you may also contact your provider using MyChart. We now offer e-Visits for anyone 18 and older to request care online for non-urgent symptoms. For details visit [email or web address].   Also download the MyChart app! Go to the app store, search "MyChart", open the app, select [facility], and log in with your MyChart username and password.  Due to Covid, a mask is required upon entering the hospital/clinic. If you do not have a mask, one will be given to you upon arrival. For doctor visits, patients may have 1 support person aged 18 or older with them. For treatment visits, patients cannot have anyone with them due to current Covid guidelines and our immunocompromised population.   

## 2020-10-10 NOTE — Progress Notes (Signed)
Met with patient in infusion to complete grant paperwork.  Patient approved for one-time $1000 [employer] grant to assist with personal expenses while going through treatment. Discussed in detail expenses and how they are covered. He has a copy of the approval letter and expense sheet along with the [employer] information. He received a gift card today from his grant.  He has my card and paperwork in green folder for any additional financial questions or concerns.

## 2020-10-11 ENCOUNTER — Ambulatory Visit
Admission: RE | Admit: 2020-10-11 | Discharge: 2020-10-11 | Disposition: A | Discharge status: Home / Self Care | Payer: PRIVATE HEALTH INSURANCE | Source: Ambulatory Visit | Attending: Radiation Oncology | Admitting: Radiation Oncology

## 2020-10-11 ENCOUNTER — Encounter: Payer: Self-pay | Admitting: Hematology and Oncology

## 2020-10-11 DIAGNOSIS — Z510 Encounter for antineoplastic radiation therapy: Secondary | ICD-10-CM | POA: Diagnosis not present

## 2020-10-12 ENCOUNTER — Other Ambulatory Visit: Payer: Self-pay

## 2020-10-12 ENCOUNTER — Ambulatory Visit
Admission: RE | Admit: 2020-10-12 | Discharge: 2020-10-12 | Disposition: A | Discharge status: Home / Self Care | Payer: PRIVATE HEALTH INSURANCE | Source: Ambulatory Visit | Attending: Radiation Oncology | Admitting: Radiation Oncology

## 2020-10-12 DIAGNOSIS — Z510 Encounter for antineoplastic radiation therapy: Secondary | ICD-10-CM | POA: Diagnosis not present

## 2020-10-15 ENCOUNTER — Ambulatory Visit
Admission: RE | Admit: 2020-10-15 | Discharge: 2020-10-15 | Disposition: A | Discharge status: Home / Self Care | Payer: PRIVATE HEALTH INSURANCE | Source: Ambulatory Visit | Attending: Radiation Oncology | Admitting: Radiation Oncology

## 2020-10-15 ENCOUNTER — Other Ambulatory Visit (HOSPITAL_COMMUNITY): Payer: Self-pay

## 2020-10-15 ENCOUNTER — Other Ambulatory Visit: Payer: Self-pay

## 2020-10-15 ENCOUNTER — Other Ambulatory Visit: Payer: Self-pay | Admitting: Radiation Oncology

## 2020-10-15 ENCOUNTER — Encounter: Payer: Self-pay | Admitting: Hematology and Oncology

## 2020-10-15 DIAGNOSIS — Z510 Encounter for antineoplastic radiation therapy: Secondary | ICD-10-CM | POA: Diagnosis not present

## 2020-10-15 DIAGNOSIS — C01 Malignant neoplasm of base of tongue: Secondary | ICD-10-CM

## 2020-10-15 MED ORDER — SCOPOLAMINE 1 MG/3DAYS TD PT72SCOPOLAMINE 1 MG/3DAYS
1.0000 | MEDICATED_PATCH | TRANSDERMAL | 3 refills | Status: DC
Start: 2020-10-15 — End: 2020-12-07
  Filled 2020-10-15 – 2020-10-17 (×2): qty 10, 30d supply, fill #0

## 2020-10-15 NOTE — Progress Notes (Signed)
Alexander [name] FOLLOW UP NOTE  Patient Care Team: Pcp, No as PCP - General Mal[place]elt, Stephani Police, RN as Oncology Nurse Navigator Benay Pike, MD as Consulting Physician (Hematology and Oncology) Rozetta Nunnery, MD as Consulting Physician (Otolaryngology) Eppie Gibson, MD as Consulting Physician (Radiation Oncology)   CHIEF COMPLAINTS/PURPOSE OF CONSULTATION:   Pre chemo visit for concurrent cisplatin/radiation. Due for week 5, pre chemo visit.  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  He completed 4 cycles of 5-FU/carbo/Keytruda for induction.   He had remarkable response and no toxicity to report He was discussed in the head and neck tumor board and the plan was to consider concurrent chemoradiation to the neck and radiation to the mediastinum given oligometastatic disease.  He clearly understands this is not necessarily curative in intent however may give him prolonged progression free interval.   He started weekly cisplatin 09/19/2020 He has been tolerating it very well.  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 Will hold planned chemo today, hope to complete 6/7 planned treatments. No indication for transfusion  Weight loss, unintentional Lost about 5 lbs since last visit. He is reluctant to use the G tube. Encouraged nourishment ,  hydration and rest which will help the cell counts recover as well. He will try the G tube, if he continues to lose weight this week  No orders of the defined types were placed in this encounter.   HISTORY OF PRESENTING ILLNESS:   Alexander [name] 60 y.o. 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[place])  05/17/2020 Initial Diagnosis   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[place])   05/18/2020 Cancer Staging   Staging form: Pharynx - HPV-Mediated Oropharynx, AJCC 8th Edition - Clinical stage from 05/18/2020: cT4, cN3, p16+ - Signed by Eppie Gibson, MD on 05/22/2020   06/01/2020 Cancer Staging   Staging form: Pharynx - HPV-Mediated Oropharynx, AJCC 8th Edition - Pathologic stage from 06/01/2020: No Stage Recommended (cT4, cN3, cM1, p16+) - Signed by Benay Pike, MD on 06/01/2020 Stage prefix: Initial diagnosis   06/12/2020 - 08/21/2020 Chemotherapy          09/10/2020 - 09/10/2020 Chemotherapy          09/19/2020 -  Chemotherapy    Patient is on Treatment Plan: HEAD/NECK CISPLATIN Q7D        INTERIM HISTORY  Alexander [name] is here for a follow up by himself. He received 4 cycles of carboplatin/5-FU and Keytruda for induction.   Given oligometastatic disease and excellent tolerance to induction chemo, we have decided to proceed with consolidation chemoradiation to the neck and palliative radiation to the mediastinum.   He is now on weekly cisplatin with radiation.   He cant taste food, so has been eating less, lost almost 5-6 lbs since last visit. No mucositis or sore throat. He denies any new change  in breathing, bowel habits or urinary habits Still reluctant to use G tube. He also noticed increased mucosal secretions,  working on getting his scopolamine patch. Rest of the 10 point ROS reviewed and neg.   MEDICAL HISTORY:  Past Medical History:  Diagnosis Date   Asthma    Cancer ([place])    GERD (gastroesophageal  reflux disease)    Sleep apnea    CPAP- does not wear    SURGICAL HISTORY: Past Surgical History:  Procedure Laterality Date   BRONCHIAL NEEDLE ASPIRATION BIOPSY  05/22/2020   Procedure: BRONCHIAL NEEDLE ASPIRATION BIOPSIES;  Surgeon: Garner Nash, DO;  Location: [facility];  Service: Pulmonary;;   DIRECT LARYNGOSCOPY  05/09/2020   Procedure: DIRECT LARYNGOSCOPY;  Surgeon: Rozetta Nunnery, MD;  Location: [facility];  Service: ENT;;   IR GASTROSTOMY TUBE MOD SED  09/13/2020   IR IMAGING GUIDED PORT INSERTION  06/12/2020   MASS EXCISION N/A 05/09/2020   Procedure: EXCISION TONGUE MASS;  Surgeon: Rozetta Nunnery, MD;  Location: [facility];  Service: ENT;  Laterality: N/A;   TONSILLECTOMY     TRACHEOSTOMY TUBE PLACEMENT  05/09/2020   Procedure: TRACHEOSTOMY;  Surgeon: Rozetta Nunnery, MD;  Location: [facility];  Service: ENT;;   VIDEO BRONCHOSCOPY WITH ENDOBRONCHIAL ULTRASOUND N/A 05/22/2020   Procedure: VIDEO BRONCHOSCOPY WITH ENDOBRONCHIAL ULTRASOUND;  Surgeon: Garner Nash, DO;  Location: [facility];  Service: Pulmonary;  Laterality: N/A;    SOCIAL HISTORY: Social History   Socioeconomic History   Marital status: Single    Spouse name: Not on file   Number of children: Not on file   Years of education: Not on file   Highest education level: Not on file  Occupational History   Not on file  Tobacco Use   Smoking status: Never   Smokeless tobacco: Never  Vaping Use   Vaping Use: Never used  Substance and Sexual Activity   Alcohol use: Not Currently    Comment: "4-5 drinks a year for special ocassions" 05/08/20   Drug use: Never   Sexual activity: Not Currently  Other Topics Concern   Not on file  Social History Narrative   Not on file   Social Determinants of Health   Financial Resource Strain: Low Risk    Difficulty of Paying Living Expenses: Not hard at all  Food Insecurity: No Food Insecurity   Worried About Charity fundraiser in the Last Year: Never true    [place] in the Last Year: Never true  Transportation Needs: No Transportation Needs   Lack of Transportation (Medical): No   Lack of Transportation (Non-Medical): No  Physical Activity: Not on file  Stress: No Stress Concern Present   Feeling of Stress : Not at all  Social Connections: Socially Isolated   Frequency of Communication with Friends and Family: More than three times a week   Frequency of Social Gatherings with Friends and Family: More than three times a week   Attends Religious Services: Never   Marine scientist or Organizations: No   Attends Archivist Meetings: Never   Marital Status: Separated  Intimate Partner Violence: Not on file    FAMILY HISTORY: Family History  Problem Relation Age of Onset   Breast cancer Mother    Cancer - Cervical Mother    Colon cancer Father    Colon cancer Brother     ALLERGIES:  has No Known Allergies.  MEDICATIONS:  Current Outpatient Medications  Medication Sig Dispense Refill   albuterol (PROVENTIL) (2.5 MG/3ML) 0.083% nebulizer solution Take 3 mLs (2.5 mg total) by nebulization every 6 (six) hours as needed for wheezing or shortness of breath. 120 mL 12   albuterol (VENTOLIN HFA) 108 (90 Base) MCG/ACT inhaler Inhale 2 puffs into the lungs as needed for wheezing or shortness of breath.     bacitracin ointment Apply 1 application topically every 8 (eight) hours. Apply around trach wound 120 g 0   Baclofen 5 MG TABS Take 1 tablet by mouth 3 (three) times daily as needed. 30 tablet 0   cetirizine (ZYRTEC) 10 MG tablet Take 10 mg by mouth daily.     dexamethasone (DECADRON) 4 MG tablet Take 2 tablets (8 mg total) by mouth daily. Take daily x 3 days starting the day after cisplatin chemotherapy. Take with food. 30 tablet 1   fluticasone (FLONASE) 50 MCG/ACT nasal spray Place 2 sprays into both nostrils daily. Place 1 spray each nostril at night 16 g 11   Fluticasone-Salmeterol (ADVAIR) 100-50 MCG/DOSE AEPB Inhale  1 puff into the lungs daily.     lidocaine (XYLOCAINE) 2 % solution Patient: Mix 1part 2% viscous lidocaine, 1part H20. Swish & swallow 41m of diluted mixture, 360m before meals and at bedtime, up to 4 times a day 200 mL 3   lidocaine-prilocaine (EMLA) cream Apply to affected area once as directed 30 g 3   lidocaine-prilocaine (EMLA) cream Apply to affected area once 30 g 3   omeprazole (PRILOSEC) 40 MG capsule Take 40 mg by mouth daily.     ondansetron (ZOFRAN) 8 MG tablet Take 1 tablet (8 mg total) by mouth 2 (two) times daily as needed. Start on the third day after cisplatin chemotherapy. 30 tablet 1   prochlorperazine (COMPAZINE) 10 MG tablet Take 1 tablet (10 mg total) by mouth every 6 (six) hours as needed (Nausea or vomiting). 30 tablet 1   scopolamine (TRANSDERM-SCOP) 1 MG/3DAYS Place 1 patch (1.5 mg total) onto the skin every 3 (three) days. 10 patch 3   No current facility-administered medications for this visit.   Facility-Administered Medications Ordered in Other Visits  Medication Dose Route Frequency Provider Last Rate Last Admin   magnesium sulfate 2 GM/50ML IVPB             PHYSICAL EXAMINATION: ECOG PERFORMANCE STATUS: 0 - Asymptomatic  Vitals:   10/17/20 0858  BP: 97/64  Pulse: 90  Resp: 18  Temp: 98.7 F (37.1 C)  SpO2: 98%   Filed Weights   10/17/20 0858  Weight: 273 lb 7 oz (124 kg)   Physical Exam Constitutional:      Appearance: Normal appearance. He is obese. He is not ill-appearing.  HENT:     Head: Normocephalic and atraumatic.     Mouth/Throat:     Mouth: Mucous membranes are moist.     Pharynx: Oropharynx is clear. Posterior oropharyngeal erythema present.  Cardiovascular:     Rate and Rhythm: Normal rate and regular rhythm.     Pulses: Normal pulses.     Heart sounds: Normal heart sounds.  Pulmonary:     Effort: Pulmonary effort is normal.     Breath sounds: Normal breath sounds.  Abdominal:     General: Abdomen is flat. Bowel sounds are  normal.     Palpations: Abdomen is soft.  Musculoskeletal:        General: No swelling. Normal range of motion.     Cervical back: Normal range of  motion.  Lymphadenopathy:     Cervical: Cervical adenopathy (Left sided cervical adenopathy continues to improve) present.  Skin:    General: Skin is warm and dry.  Neurological:     General: No focal deficit present.     Mental Status: He is alert.  Psychiatric:        Mood and Affect: Mood normal.        Behavior: Behavior normal.   LABORATORY DATA:  I have reviewed the data as listed Lab Results  Component Value Date   WBC 3.5 (L) 10/17/2020   HGB 11.1 (L) 10/17/2020   HCT 32.1 (L) 10/17/2020   MCV 99.4 10/17/2020   PLT 81 (L) 10/17/2020     Chemistry      Component Value Date/Time   NA 138 10/17/2020 0841   K 3.9 10/17/2020 0841   CL 104 10/17/2020 0841   CO2 27 10/17/2020 0841   BUN 15 10/17/2020 0841   CREATININE 1.06 10/17/2020 0841      Component Value Date/Time   CALCIUM 9.0 10/17/2020 0841   ALKPHOS 53 09/19/2020 0826   AST 17 09/19/2020 0826   ALT 15 09/19/2020 0826   BILITOT 0.6 09/19/2020 0826     RADIOGRAPHIC STUDIES: I have personally reviewed the radiological images as listed and agreed with the findings in the report. No results found.  All questions were answered. The patient knows to call the clinic with any problems, questions or concerns. Reviewed his labs, has plt count of 81 K, Will defer C5, he will still receive at least 6/7 planned treatment.   Benay Pike, MD 10/17/2020 9:55 AM

## 2020-10-16 ENCOUNTER — Encounter: Payer: Self-pay | Admitting: Hematology and Oncology

## 2020-10-16 ENCOUNTER — Ambulatory Visit
Admission: RE | Admit: 2020-10-16 | Discharge: 2020-10-16 | Disposition: A | Discharge status: Home / Self Care | Payer: PRIVATE HEALTH INSURANCE | Source: Ambulatory Visit | Attending: Radiation Oncology | Admitting: Radiation Oncology

## 2020-10-16 DIAGNOSIS — Z510 Encounter for antineoplastic radiation therapy: Secondary | ICD-10-CM | POA: Diagnosis not present

## 2020-10-17 ENCOUNTER — Other Ambulatory Visit (HOSPITAL_COMMUNITY): Payer: Self-pay

## 2020-10-17 ENCOUNTER — Inpatient Hospital Stay (HOSPITAL_BASED_OUTPATIENT_CLINIC_OR_DEPARTMENT_OTHER): Payer: PRIVATE HEALTH INSURANCE | Admitting: Hematology and Oncology

## 2020-10-17 ENCOUNTER — Inpatient Hospital Stay: Payer: PRIVATE HEALTH INSURANCE

## 2020-10-17 ENCOUNTER — Ambulatory Visit
Admission: RE | Admit: 2020-10-17 | Discharge: 2020-10-17 | Disposition: A | Discharge status: Home / Self Care | Payer: PRIVATE HEALTH INSURANCE | Source: Ambulatory Visit | Attending: Radiation Oncology | Admitting: Radiation Oncology

## 2020-10-17 ENCOUNTER — Other Ambulatory Visit: Payer: Self-pay

## 2020-10-17 ENCOUNTER — Inpatient Hospital Stay: Payer: PRIVATE HEALTH INSURANCE | Admitting: Dietician

## 2020-10-17 ENCOUNTER — Other Ambulatory Visit: Payer: Self-pay | Admitting: Radiation Oncology

## 2020-10-17 ENCOUNTER — Encounter: Payer: Self-pay | Admitting: Hematology and Oncology

## 2020-10-17 DIAGNOSIS — Z510 Encounter for antineoplastic radiation therapy: Secondary | ICD-10-CM | POA: Diagnosis not present

## 2020-10-17 DIAGNOSIS — C01 Malignant neoplasm of base of tongue: Secondary | ICD-10-CM | POA: Diagnosis not present

## 2020-10-17 DIAGNOSIS — R634 Abnormal weight loss: Secondary | ICD-10-CM

## 2020-10-17 DIAGNOSIS — D696 Thrombocytopenia, unspecified: Secondary | ICD-10-CM | POA: Diagnosis not present

## 2020-10-17 DIAGNOSIS — Z95828 Presence of other vascular implants and grafts: Secondary | ICD-10-CM

## 2020-10-17 LAB — BASIC METABOLIC PANEL - CANCER CENTER ONLY
Anion gap: 7 (ref 5–15)
BUN: 15 mg/dL (ref 6–20)
CO2: 27 mmol/L (ref 22–32)
Calcium: 9 mg/dL (ref 8.9–10.3)
Chloride: 104 mmol/L (ref 98–111)
Creatinine: 1.06 mg/dL (ref 0.61–1.24)
GFR, Estimated: >60 mL/min (ref >60)
Glucose, Bld: 90 mg/dL (ref 70–99)
Potassium: 3.9 mmol/L (ref 3.5–5.1)
Sodium: 138 mmol/L (ref 135–145)

## 2020-10-17 LAB — CBC WITH DIFFERENTIAL (CANCER CENTER ONLY)
Abs Immature Granulocytes: 0.02 10*3/uL (ref 0.00–0.07)
Basophils Absolute: 0 10*3/uL (ref 0.0–0.1)
Basophils Relative: 0 %
Eosinophils Absolute: 0 10*3/uL (ref 0.0–0.5)
Eosinophils Relative: 1 %
HCT: 32.1 % — ABNORMAL LOW (ref 39.0–52.0)
Hemoglobin: 11.1 g/dL — ABNORMAL LOW (ref 13.0–17.0)
Immature Granulocytes: 1 %
Lymphocytes Relative: 3 %
Lymphs Abs: 0.1 10*3/uL — ABNORMAL LOW (ref 0.7–4.0)
MCH: 34.4 pg — ABNORMAL HIGH (ref 26.0–34.0)
MCHC: 34.6 g/dL (ref 30.0–36.0)
MCV: 99.4 fL (ref 80.0–100.0)
Monocytes Absolute: 0.3 10*3/uL (ref 0.1–1.0)
Monocytes Relative: 7 %
Neutro Abs: 3.1 10*3/uL (ref 1.7–7.7)
Neutrophils Relative %: 88 %
Platelet Count: 81 10*3/uL — ABNORMAL LOW (ref 150–400)
RBC: 3.23 MIL/uL — ABNORMAL LOW (ref 4.22–5.81)
RDW: 15.7 % — ABNORMAL HIGH (ref 11.5–15.5)
WBC Count: 3.5 10*3/uL — ABNORMAL LOW (ref 4.0–10.5)
nRBC: 0 % (ref 0.0–0.2)

## 2020-10-17 LAB — MAGNESIUM: Magnesium: 1.9 mg/dL (ref 1.7–2.4)

## 2020-10-17 MED ORDER — SODIUM CHLORIDE 0.9% FLUSH
10.0000 mL | Freq: Once | INTRAVENOUS | Status: AC
  Administered 2020-10-17: 10 mL

## 2020-10-17 MED ORDER — OSMOLITE 1.5 CAL PO LIQD: ORAL | 0 refills | Status: DC

## 2020-10-17 MED ORDER — PROSOURCE TF PO LIQD: 45.0000 mL | Freq: Four times a day (QID) | ORAL | Status: DC

## 2020-10-17 NOTE — Assessment & Plan Note (Signed)
Likely cisplatin induced. Will hold planned chemo today, hope to complete 6/7 planned treatments. No indication for transfusion

## 2020-10-17 NOTE — Assessment & Plan Note (Signed)
Lost about 5 lbs since last visit. He is reluctant to use the G tube. Encouraged nourishment , hydration and rest which will help the cell counts recover as well. He will try the G tube, if he continues to lose weight this week

## 2020-10-17 NOTE — Assessment & Plan Note (Signed)
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  He completed 4 cycles of 5-FU/carbo/Keytruda for induction.   He had remarkable response and no toxicity to report He was discussed in the head and neck tumor board and the plan was to consider concurrent chemoradiation to the neck and radiation to the mediastinum given oligometastatic disease.  He clearly understands this is not necessarily curative in intent however may give him prolonged progression free interval.   He started weekly cisplatin 09/19/2020 He has been tolerating it very well.  Given ongoing worsening thrombocytopenia, we will plan to skip his C5 of cisplatin today, No concerns on PE RTC in one week as planned He will still be able to get 6/7 of planned cycles

## 2020-10-17 NOTE — Progress Notes (Signed)
Nutrition Follow-up:  Patient with tongue cancer. He , receiving concurrent chemoradiation therapy with weekly cisplatin. He is s/p PEG placement on 7/18  Patient did not receive chemotherapy today. Nutrition follow-up completed in office. Patient reports he will resume chemotherapy next week if labs improve. Patient reports trying eat orally, says all foods "taste nasty" and is nauseated with oral intake. Patient reports making 2 eggs with cheese for breakfast, says he only ate a few bites. Yesterday patient reports eating half of breakfast bowl, side of mac and cheese from [employer] for lunch, and a small amount of shrimp pasta for dinner. He continues to drink water by mouth, states this is difficult to tolerate and drinking less, but unable to quantify. He denies vomiting, diarrhea, constipation. Patient understands that he is no longer able to meet his nutrition needs orally and agrees to use his tube. Patient has been flushing with water daily, he feels confident about ability to give tube feedings at home. Patient politely declined assistance with giving tube feeding today in office.   Medications: reviewed  Labs: reviewed  Anthropometrics: Weight 273 lb 7 oz today decreased from 279 lb 1.6 oz on 8/24. Patient has lost 6 lbs (2.2%) in 7 days. This is significant for time frame  8/17 - 280 lb 8/11 - 279.6 lb 7/22 - 282.2 lb  Estimated Energy Needs  Kcals: 2563-8937 Protein: 145-160 Fluid: 3 L  NUTRITION DIAGNOSIS: Food and nutrition related knowledge deficit resolved  NEW NUTRITION DIAGNOSIS: Inadequate oral intake related to tongue cancer and associated concurrent chemoradiation treatment side effects as evidenced by poor appetite, oral intake meeting <50% of estimated needs per recall, significant 2.2% wt loss in 7 days   INTERVENTION:  Encouraged oral intake of soft moist high protein foods as tolerated Continue baking soda salt water rinses several times daily Patient agrees  to begin using feeding tube, educated patient to start with 1 carton Osmolite 1.5 QID with 90 ml water flush before and after each feeding. Patient will increase by 1/2 carton/day to goal of 7 cartons/day with 90 ml free water flush before and after feeding. Drink by mouth or give via tube additional 4 cups fluids daily Prosource TF - 45 ml via tube QID Regimen provides 2645 kcal, 148 grams protein, 2947 ml total water with flushes   MONITORING, EVALUATION, GOAL: weight trends, intake, tube feeding   NEXT VISIT: Thursday September 8 with Pamala Hurry

## 2020-10-18 ENCOUNTER — Ambulatory Visit
Admission: RE | Admit: 2020-10-18 | Discharge: 2020-10-18 | Disposition: A | Discharge status: Home / Self Care | Payer: PRIVATE HEALTH INSURANCE | Source: Ambulatory Visit | Attending: Radiation Oncology | Admitting: Radiation Oncology

## 2020-10-18 ENCOUNTER — Encounter: Payer: Self-pay | Admitting: Hematology and Oncology

## 2020-10-18 ENCOUNTER — Ambulatory Visit (INDEPENDENT_AMBULATORY_CARE_PROVIDER_SITE_OTHER): Payer: PRIVATE HEALTH INSURANCE | Admitting: Otolaryngology

## 2020-10-18 DIAGNOSIS — C159 Malignant neoplasm of esophagus, unspecified: Secondary | ICD-10-CM | POA: Insufficient documentation

## 2020-10-18 DIAGNOSIS — Z85819 Personal history of malignant neoplasm of unspecified site of lip, oral cavity, and pharynx: Secondary | ICD-10-CM

## 2020-10-18 NOTE — Progress Notes (Addendum)
HPI: Alexander [name] is a 60 y.o. 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  He is completing his radiation and chemotherapy toward the end of this month.  He was told that he would get a PET scan 3 months following completion of his treatment which would be in  December or January.Alexander [name] He is doing well presently.  He is having no airway problems as he got rid of his trach several weeks ago. He is having no voice problems today.  Past Medical History:  Diagnosis Date   Asthma    Cancer ([place])    GERD (gastroesophageal reflux disease)    Sleep apnea    CPAP- does not wear   Past Surgical History:  Procedure Laterality Date   BRONCHIAL NEEDLE ASPIRATION BIOPSY  05/22/2020   Procedure: BRONCHIAL NEEDLE ASPIRATION BIOPSIES;  Surgeon: Garner Nash, DO;  Location: [facility];  Service: Pulmonary;;   DIRECT LARYNGOSCOPY  05/09/2020   Procedure: DIRECT LARYNGOSCOPY;  Surgeon: Rozetta Nunnery, MD;  Location: [place];  Service: ENT;;   IR GASTROSTOMY TUBE MOD SED  09/13/2020   IR IMAGING GUIDED PORT INSERTION  06/12/2020   MASS EXCISION N/A 05/09/2020   Procedure: EXCISION TONGUE MASS;  Surgeon: Rozetta Nunnery, MD;  Location: [facility] OR;  Service: ENT;  Laterality: N/A;   TONSILLECTOMY     TRACHEOSTOMY TUBE PLACEMENT  05/09/2020   Procedure: TRACHEOSTOMY;  Surgeon: Rozetta Nunnery, MD;  Location: [place];  Service: ENT;;   VIDEO BRONCHOSCOPY WITH ENDOBRONCHIAL ULTRASOUND N/A 05/22/2020   Procedure: VIDEO BRONCHOSCOPY WITH ENDOBRONCHIAL ULTRASOUND;  Surgeon: Garner Nash, DO;  Location: [place];  Service: Pulmonary;  Laterality: N/A;   Social History   Socioeconomic History   Marital status: Single    Spouse name: Not on file   Number of children: Not on file   Years of education: Not on file   Highest education level: Not on file  Occupational History   Not on file  Tobacco Use   Smoking status: Never    Smokeless tobacco: Never  Vaping Use   Vaping Use: Never used  Substance and Sexual Activity   Alcohol use: Not Currently    Comment: "4-5 drinks a year for special ocassions" 05/08/20   Drug use: Never   Sexual activity: Not Currently  Other Topics Concern   Not on file  Social History Narrative   Not on file   Social Determinants of Health   Financial Resource Strain: Low Risk    Difficulty of Paying Living Expenses: Not hard at all  Food Insecurity: No Food Insecurity   Worried About Charity fundraiser in the Last Year: Never true   [place] in the Last Year: Never true  Transportation Needs: No Transportation Needs   Lack of Transportation (Medical): No   Lack of Transportation (Non-Medical): No  Physical Activity: Not on file  Stress: No Stress Concern Present   Feeling of Stress : Not at all  Social Connections: Socially Isolated   Frequency of Communication with Friends and Family: More than three times a week   Frequency of Social Gatherings with Friends and Family: More than three times a week   Attends Religious Services: Never   Marine scientist or Organizations: No   Attends Archivist Meetings: Never   Marital Status: Separated   Family History  Problem Relation Age of Onset  Breast cancer Mother    Cancer - Cervical Mother    Colon cancer Father    Colon cancer Brother    No Known Allergies Prior to Admission medications   Medication Sig Start Date End Date Taking? Authorizing Provider  albuterol (PROVENTIL) (2.5 MG/3ML) 0.083% nebulizer solution Take 3 mLs (2.5 mg total) by nebulization every 6 (six) hours as needed for wheezing or shortness of breath. 05/17/20   Icard, Octavio Graves, DO  albuterol (VENTOLIN HFA) 108 (90 Base) MCG/ACT inhaler Inhale 2 puffs into the lungs as needed for wheezing or shortness of breath.    [provider]  bacitracin ointment Apply 1 application topically every 8 (eight) hours. Apply around trach  wound 05/12/20   Aline August, MD  Baclofen 5 MG TABS Take 1 tablet by mouth 3 (three) times daily as needed. 10/04/20   Benay Pike, MD  cetirizine (ZYRTEC) 10 MG tablet Take 10 mg by mouth daily.    [provider]  dexamethasone (DECADRON) 4 MG tablet Take 2 tablets (8 mg total) by mouth daily. Take daily x 3 days starting the day after cisplatin chemotherapy. Take with food. 09/18/20   Benay Pike, MD  fluticasone (FLONASE) 50 MCG/ACT nasal spray Place 2 sprays into both nostrils daily. Place 1 spray each nostril at night 05/25/20   Rozetta Nunnery, MD  Fluticasone-Salmeterol (ADVAIR) 100-50 MCG/DOSE AEPB Inhale 1 puff into the lungs daily.    [provider]  lidocaine (XYLOCAINE) 2 % solution Patient: Mix 1part 2% viscous lidocaine, 1part H20. Swish & swallow 49m of diluted mixture, 381m before meals and at bedtime, up to 4 times a day 09/24/20   SqEppie GibsonMD  lidocaine-prilocaine (EMLA) cream Apply to affected area once as directed 09/14/20   IrBenay PikeMD  lidocaine-prilocaine (EMLA) cream Apply to affected area once 09/18/20   IrBenay PikeMD  Nutritional Supplements (FEEDING SUPPLEMENT, OSMOLITE 1.5 CAL,) LIQD Give 7 cartons Osmolite 1.5 via tube split over four feedings/day with 90 ml free water flush before and after each feeding. Drink by mouth or give via tube additional 4 cups fluids daily. Meets 92% kcal, 72% protein needs. 10/17/20   IrBenay PikeMD  Nutritional Supplements (FEEDING SUPPLEMENT, PROSOURCE TF,) liquid Place 45 mLs into feeding tube 4 (four) times daily. 10/17/20   IrBenay PikeMD  omeprazole (PRILOSEC) 40 MG capsule Take 40 mg by mouth daily.    [provider]  ondansetron (ZOFRAN) 8 MG tablet Take 1 tablet (8 mg total) by mouth 2 (two) times daily as needed. Start on the third day after cisplatin chemotherapy. 09/18/20   IrBenay PikeMD  prochlorperazine (COMPAZINE) 10 MG tablet Take 1 tablet (10 mg total) by  mouth every 6 (six) hours as needed (Nausea or vomiting). 09/18/20   IrBenay PikeMD  scopolamine (TRANSDERM-SCOP) 1 MG/3DAYS Place 1 patch (1.5 mg total) onto the skin every 3 (three) days. 10/15/20   SqEppie GibsonMD     Positive ROS: Otherwise negative  All other systems have been reviewed and were otherwise negative with the exception of those mentioned in the HPI and as above.  Physical Exam: Constitutional: Alert, well-appearing, no acute distress Ears: External ears without lesions or tenderness. Ear canals are clear bilaterally with intact, clear TMs.  Nasal: External nose without lesions. Septum with mild deviation and mild rhinitis.. Clear nasal passages otherwise. Oral: Lips and gums without lesions. Tongue and palate mucosa without lesions. Posterior oropharynx clear with mild edema.  Indirect  laryngoscopy revealed fairly clear base of tongue with a little bit more prominent tissue on the right side compared to the left but this is soft to palpation.  I am able to visualize the vocal cords clearly on indirect laryngoscopy and vocal cords are clear with symmetric mobility bilaterally. Neck: Well-healed trach scar.  He still has a little bit of adenopathy in the left neck on palpation measuring approximately 2 cm in size and is firm to palpation. Respiratory: Breathing comfortably  Skin: No facial/neck lesions or rash noted.  Procedures  Assessment: Patient completing chemoradiation treatment for a large base of tongue cancer that was metastatic to the left neck nodes.  Cancer appears to be responding well to present treatment.   Plan: Recommended follow-up ENT evaluation following completion of his PET scan. Suggested follow-up with Dr. Constance Holster or [facility] in January or February of next year for 1 or 2 months following the PET scan which will be scheduled by Dr. Isidore Moos.   Radene Journey, MD

## 2020-10-19 ENCOUNTER — Ambulatory Visit
Admission: RE | Admit: 2020-10-19 | Discharge: 2020-10-19 | Disposition: A | Discharge status: Home / Self Care | Payer: PRIVATE HEALTH INSURANCE | Source: Ambulatory Visit | Attending: Radiation Oncology | Admitting: Radiation Oncology

## 2020-10-19 ENCOUNTER — Other Ambulatory Visit: Payer: Self-pay

## 2020-10-19 ENCOUNTER — Telehealth: Payer: Self-pay | Admitting: Hematology and Oncology

## 2020-10-19 ENCOUNTER — Telehealth: Payer: Self-pay

## 2020-10-19 DIAGNOSIS — C159 Malignant neoplasm of esophagus, unspecified: Secondary | ICD-10-CM | POA: Diagnosis not present

## 2020-10-19 NOTE — Telephone Encounter (Signed)
No los 8/31

## 2020-10-19 NOTE — Telephone Encounter (Signed)
Received records request from [facility]. Requested records faxed.

## 2020-10-23 ENCOUNTER — Other Ambulatory Visit: Payer: Self-pay

## 2020-10-23 ENCOUNTER — Ambulatory Visit
Admission: RE | Admit: 2020-10-23 | Discharge: 2020-10-23 | Disposition: A | Discharge status: Home / Self Care | Payer: PRIVATE HEALTH INSURANCE | Source: Ambulatory Visit | Attending: Radiation Oncology | Admitting: Radiation Oncology

## 2020-10-23 ENCOUNTER — Other Ambulatory Visit: Payer: Self-pay | Admitting: Radiation Oncology

## 2020-10-23 DIAGNOSIS — C01 Malignant neoplasm of base of tongue: Secondary | ICD-10-CM

## 2020-10-23 DIAGNOSIS — C159 Malignant neoplasm of esophagus, unspecified: Secondary | ICD-10-CM | POA: Diagnosis not present

## 2020-10-24 ENCOUNTER — Ambulatory Visit
Admission: RE | Admit: 2020-10-24 | Discharge: 2020-10-24 | Disposition: A | Discharge status: Home / Self Care | Payer: PRIVATE HEALTH INSURANCE | Source: Ambulatory Visit | Attending: Radiation Oncology | Admitting: Radiation Oncology

## 2020-10-24 DIAGNOSIS — C159 Malignant neoplasm of esophagus, unspecified: Secondary | ICD-10-CM | POA: Diagnosis not present

## 2020-10-25 ENCOUNTER — Other Ambulatory Visit: Payer: Self-pay

## 2020-10-25 ENCOUNTER — Inpatient Hospital Stay: Payer: BC Managed Care – PPO

## 2020-10-25 ENCOUNTER — Inpatient Hospital Stay: Payer: BC Managed Care – PPO | Admitting: Nutrition

## 2020-10-25 ENCOUNTER — Inpatient Hospital Stay (HOSPITAL_BASED_OUTPATIENT_CLINIC_OR_DEPARTMENT_OTHER): Payer: BC Managed Care – PPO | Admitting: Hematology and Oncology

## 2020-10-25 ENCOUNTER — Telehealth: Payer: Self-pay | Admitting: Nutrition

## 2020-10-25 ENCOUNTER — Inpatient Hospital Stay: Payer: BC Managed Care – PPO | Attending: Hematology and Oncology

## 2020-10-25 ENCOUNTER — Encounter: Payer: Self-pay | Admitting: Hematology and Oncology

## 2020-10-25 ENCOUNTER — Ambulatory Visit
Admission: RE | Admit: 2020-10-25 | Discharge: 2020-10-25 | Disposition: A | Discharge status: Home / Self Care | Payer: PRIVATE HEALTH INSURANCE | Source: Ambulatory Visit | Attending: Radiation Oncology | Admitting: Radiation Oncology

## 2020-10-25 VITALS — BP 102/73 | HR 79 | Temp 98.2°F | Resp 17 | Wt 270.5 lb

## 2020-10-25 DIAGNOSIS — R634 Abnormal weight loss: Secondary | ICD-10-CM | POA: Diagnosis not present

## 2020-10-25 DIAGNOSIS — D72818 Other decreased white blood cell count: Secondary | ICD-10-CM | POA: Insufficient documentation

## 2020-10-25 DIAGNOSIS — C01 Malignant neoplasm of base of tongue: Secondary | ICD-10-CM

## 2020-10-25 DIAGNOSIS — D6959 Other secondary thrombocytopenia: Secondary | ICD-10-CM | POA: Insufficient documentation

## 2020-10-25 DIAGNOSIS — R5383 Other fatigue: Secondary | ICD-10-CM

## 2020-10-25 DIAGNOSIS — Z5111 Encounter for antineoplastic chemotherapy: Secondary | ICD-10-CM | POA: Insufficient documentation

## 2020-10-25 DIAGNOSIS — D72819 Decreased white blood cell count, unspecified: Secondary | ICD-10-CM

## 2020-10-25 DIAGNOSIS — C159 Malignant neoplasm of esophagus, unspecified: Secondary | ICD-10-CM | POA: Diagnosis not present

## 2020-10-25 DIAGNOSIS — Z9221 Personal history of antineoplastic chemotherapy: Secondary | ICD-10-CM | POA: Diagnosis not present

## 2020-10-25 DIAGNOSIS — D696 Thrombocytopenia, unspecified: Secondary | ICD-10-CM | POA: Diagnosis not present

## 2020-10-25 DIAGNOSIS — Z931 Gastrostomy status: Secondary | ICD-10-CM | POA: Insufficient documentation

## 2020-10-25 DIAGNOSIS — R530 Neoplastic (malignant) related fatigue: Secondary | ICD-10-CM | POA: Insufficient documentation

## 2020-10-25 DIAGNOSIS — Z95828 Presence of other vascular implants and grafts: Secondary | ICD-10-CM

## 2020-10-25 LAB — BASIC METABOLIC PANEL - CANCER CENTER ONLY
Anion gap: 9 (ref 5–15)
BUN: 14 mg/dL (ref 6–20)
CO2: 27 mmol/L (ref 22–32)
Calcium: 9 mg/dL (ref 8.9–10.3)
Chloride: 105 mmol/L (ref 98–111)
Creatinine: 1.09 mg/dL (ref 0.61–1.24)
GFR, Estimated: >60 mL/min (ref >60)
Glucose, Bld: 103 mg/dL — ABNORMAL HIGH (ref 70–99)
Potassium: 4.3 mmol/L (ref 3.5–5.1)
Sodium: 141 mmol/L (ref 135–145)

## 2020-10-25 LAB — CBC WITH DIFFERENTIAL (CANCER CENTER ONLY)
Abs Immature Granulocytes: 0 10*3/uL (ref 0.00–0.07)
Basophils Absolute: 0 10*3/uL (ref 0.0–0.1)
Basophils Relative: 1 %
Eosinophils Absolute: 0 10*3/uL (ref 0.0–0.5)
Eosinophils Relative: 1 %
HCT: 30 % — ABNORMAL LOW (ref 39.0–52.0)
Hemoglobin: 10.4 g/dL — ABNORMAL LOW (ref 13.0–17.0)
Immature Granulocytes: 0 %
Lymphocytes Relative: 5 %
Lymphs Abs: 0.1 10*3/uL — ABNORMAL LOW (ref 0.7–4.0)
MCH: 35 pg — ABNORMAL HIGH (ref 26.0–34.0)
MCHC: 34.7 g/dL (ref 30.0–36.0)
MCV: 101 fL — ABNORMAL HIGH (ref 80.0–100.0)
Monocytes Absolute: 0.2 10*3/uL (ref 0.1–1.0)
Monocytes Relative: 13 %
Neutro Abs: 1.4 10*3/uL — ABNORMAL LOW (ref 1.7–7.7)
Neutrophils Relative %: 80 %
Platelet Count: 78 10*3/uL — ABNORMAL LOW (ref 150–400)
RBC: 2.97 MIL/uL — ABNORMAL LOW (ref 4.22–5.81)
RDW: 15.6 % — ABNORMAL HIGH (ref 11.5–15.5)
WBC Count: 1.7 10*3/uL — ABNORMAL LOW (ref 4.0–10.5)
nRBC: 0 % (ref 0.0–0.2)

## 2020-10-25 LAB — MAGNESIUM: Magnesium: 1.9 mg/dL (ref 1.7–2.4)

## 2020-10-25 MED ORDER — SODIUM CHLORIDE 0.9% FLUSH
10.0000 mL | Freq: Once | INTRAVENOUS | Status: AC
  Administered 2020-10-25: 10 mL

## 2020-10-25 NOTE — Assessment & Plan Note (Signed)
He is currently using G-tube, using 4 cans of Osmolite a day along with Prosource as instructed.  He is unable to eat because he has no taste and the thickened saliva does not help.  He was unable to get scopolamine as prescribed because his insurance would not cover it. I have asked him to discuss with radiation oncology again if he has difficulty obtaining the scopolamine.

## 2020-10-25 NOTE — Telephone Encounter (Signed)
Patient's chemotherapy for tongue cancer was canceled.  I contacted patient by telephone to follow-up on tube feeding administration.  Patient denies nutrition impact symptoms other than taste alterations.  He reports food taste so nasty that he is unable to eat food or drink water. States he is using 2 cartons per feeding and tries to get at least 3 feedings in a day.  He is using 1 Prosource with feedings. Weight documented is 270.5 pounds on September 8.  This is decreased from 280 pounds August 17. Patient has been unable to increase tube feeding to 4 feedings a day.  He states the reason for this is that he is unable to stay awake long enough in a 24-hour period to give 4 feedings daily. Patient reports he received tube feeding and supplies from [facility] yesterday.  Also reports RD from [employer] [facility] contacted him.  Nutrition diagnosis: Inadequate oral intake continues.  Intervention: Provided support and encouragement for patient to work to increase Osmolite 1.5 to a minimum of 2 cartons 3 times daily.  Try to get 1 serving Prosource with each feeding.  Be sure to flush feeding tube with water before and after bolus feeds.  If able, encourage patient to increase Osmolite 1.5-7 cartons daily as tolerated. Recommended patient contact RD if she if he has questions or concerns prior to next follow-up. Patient is agreeable.  Monitoring, evaluation, goals: Patient will work to increase tube feeding to goal rate to minimize weight loss.  Next visit: Wednesday, September 14 with Vinnie Level during infusion.  **Disclaimer: This note was dictated with voice recognition software. Similar sounding words can inadvertently be transcribed and this note may contain transcription errors which may not have been corrected upon publication of note.**

## 2020-10-25 NOTE — Assessment & Plan Note (Signed)
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    He completed 4 cycles of 5-FU/carbo/Keytruda for induction.   He had remarkable response and no toxicity to report He was discussed in the head and neck tumor board and the plan was to consider concurrent chemoradiation to the neck and radiation to the mediastinum given oligometastatic disease.   He clearly understands this is not necessarily curative in intent however may give him prolonged progression free interval.   He started weekly cisplatin 09/19/2020 He started noticing increased fatigue, worsening cytopenias and hence received only 4 cycles of planned chemotherapy so far.  We had to skip cycle 5.  Cycle 6-day 1 anticipated today unfortunately cannot be administered because of ongoing worsening leukopenia and thrombocytopenia. We will plan to deliver cycle 5-day 1 of cisplatin next week and discontinue treatment after that. He understands that it is very important to continue using the G-tube and adequately nourish himself so he is cytopenias can recover on time.

## 2020-10-25 NOTE — Progress Notes (Signed)
[name]  Patient Care Team: Pcp, No as PCP - General [place], Stephani Police, RN as Oncology Nurse Navigator Benay Pike, MD as Consulting Physician (Hematology and Oncology) Rozetta Nunnery, MD as Consulting Physician (Otolaryngology) Eppie Gibson, MD as Consulting Physician (Radiation Oncology)   CHIEF COMPLAINTS/PURPOSE OF CONSULTATION:   Pre chemo visit for concurrent cisplatin/radiation. Due for week 5, pre chemo visit.  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    He completed 4 cycles of 5-FU/carbo/Keytruda for induction.   He had remarkable response and no toxicity to report He was discussed in the head and neck tumor board and the plan was to consider concurrent chemoradiation to the neck and radiation to the mediastinum given oligometastatic disease.   He clearly understands this is not necessarily curative in intent however may give him prolonged progression free interval.   He started weekly cisplatin 09/19/2020 He started noticing increased fatigue, worsening cytopenias and hence received only 4 cycles of planned chemotherapy so far.  We had to skip cycle 5.  Cycle 6-day 1 anticipated today unfortunately cannot be administered because of ongoing worsening leukopenia and thrombocytopenia. We will plan to deliver cycle 5-day 1 of cisplatin next week and discontinue treatment after that. He understands that it is very important to continue using the G-tube and adequately nourish himself so he is cytopenias can  recover on time.  Thrombocytopenia ([place]) Related to cisplatin.  Platelet count down from 81,000-78,000.  No stigmata of bleeding.  We will continue to monitor.  Weight loss, unintentional He is currently using G-tube, using 4 cans of Osmolite a day along with Prosource as instructed.  He is unable to eat because he has no taste and the thickened saliva does not help.  He was unable to get scopolamine as prescribed because his insurance would not cover it. I have asked him to discuss with radiation oncology again if he has difficulty obtaining the scopolamine.   No orders of the defined types were placed in this encounter.   HISTORY OF PRESENTING ILLNESS:   Alexander [name] 60 y.o. 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[place])  05/17/2020 Initial Diagnosis   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[place])   05/18/2020 Cancer Staging   Staging form: Pharynx - HPV-Mediated Oropharynx, AJCC 8th Edition - Clinical stage from 05/18/2020: cT4, cN3, p16+ - Signed by Eppie Gibson, MD on 05/22/2020   06/01/2020 Cancer Staging   Staging form: Pharynx - HPV-Mediated Oropharynx, AJCC 8th Edition - Pathologic stage from 06/01/2020: No Stage Recommended (cT4, cN3, cM1, p16+) - Signed by Benay Pike, MD on 06/01/2020 Stage prefix: Initial diagnosis   06/12/2020 - 08/21/2020 Chemotherapy          09/10/2020 - 09/10/2020 Chemotherapy          09/19/2020 -  Chemotherapy    Patient is on Treatment Plan: HEAD/NECK CISPLATIN Q7D        INTERIM HISTORY  [name] is here for a follow up by himself. He received 4 cycles of  carboplatin/5-FU and Keytruda for induction.   Given oligometastatic disease and excellent tolerance to induction chemo, we have decided to proceed with consolidation chemoradiation to the neck and palliative radiation to the mediastinum.   He is now on weekly cisplatin with radiation.   C1D1 09/19/2020 C2D1  09/27/2020 C3D1 10/03/2020 C4D1 10/10/2020 C5D1 planned for today  [name] is here for follow-up.  He is fatigued as expected from ongoing treatment.  Since last visit, he has started using the G-tube.  He has been using 2 cans of Osmolite with breakfast and 2 cans of Osmolite with.  In between he also has been using Prosource as instructed.  He lost a couple pounds since last visit.  He feels really full when he is using the Osmolite.  No pain around the G-tube were in his throat.  No change in breathing.  No change in bowel habits and urinary habits. He is understandably feeling down that he had to actually rely on the G-tube. Rest of the pertinent 10 point ROS reviewed and negative.  MEDICAL HISTORY:  Past Medical History:  Diagnosis Date   Asthma    Cancer ([place])    GERD (gastroesophageal reflux disease)    Sleep apnea    CPAP- does not wear    SURGICAL HISTORY: Past Surgical History:  Procedure Laterality Date   BRONCHIAL NEEDLE ASPIRATION BIOPSY  05/22/2020   Procedure: BRONCHIAL NEEDLE ASPIRATION BIOPSIES;  Surgeon: Garner Nash, DO;  Location: [place] [facility];  Service: Pulmonary;;   DIRECT LARYNGOSCOPY  05/09/2020   Procedure: DIRECT LARYNGOSCOPY;  Surgeon: Rozetta Nunnery, MD;  Location: [place];  Service: ENT;;   IR GASTROSTOMY TUBE MOD SED  09/13/2020   IR IMAGING GUIDED PORT INSERTION  06/12/2020   MASS EXCISION N/A 05/09/2020   Procedure: EXCISION TONGUE MASS;  Surgeon: Rozetta Nunnery, MD;  Location: [facility] OR;  Service: ENT;  Laterality: N/A;   TONSILLECTOMY     TRACHEOSTOMY TUBE PLACEMENT  05/09/2020   Procedure: TRACHEOSTOMY;  Surgeon: Rozetta Nunnery, MD;  Location: [place];  Service: ENT;;   VIDEO BRONCHOSCOPY WITH ENDOBRONCHIAL ULTRASOUND N/A 05/22/2020   Procedure: VIDEO BRONCHOSCOPY WITH ENDOBRONCHIAL ULTRASOUND;  Surgeon: Garner Nash, DO;  Location: [place];  Service: Pulmonary;  Laterality: N/A;    SOCIAL HISTORY: Social History    Socioeconomic History   Marital status: Single    Spouse name: Not on file   Number of children: Not on file   Years of education: Not on file   Highest education level: Not on file  Occupational History   Not on file  Tobacco Use   Smoking status: Never   Smokeless tobacco: Never  Vaping Use   Vaping Use: Never used  Substance and Sexual Activity   Alcohol use: Not Currently    Comment: "4-5 drinks a year for special ocassions" 05/08/20   Drug use: Never   Sexual activity: Not Currently  Other Topics Concern   Not on file  Social History Narrative   Not on file   Social Determinants of Health   Financial Resource Strain: Low Risk    Difficulty of Paying Living Expenses: Not hard at all  Food Insecurity: No Food Insecurity   Worried About Charity fundraiser in the Last Year: Never true   [place] in the Last Year: Never true  Transportation Needs: No Transportation Needs   Lack of Transportation (Medical): No   Lack of Transportation (Non-Medical): No  Physical Activity:  Not on file  Stress: No Stress Concern Present   Feeling of Stress : Not at all  Social Connections: Socially Isolated   Frequency of Communication with Friends and Family: More than three times a week   Frequency of Social Gatherings with Friends and Family: More than three times a week   Attends Religious Services: Never   Marine scientist or Organizations: No   Attends Archivist Meetings: Never   Marital Status: Separated  Intimate Partner Violence: Not on file    FAMILY HISTORY: Family History  Problem Relation Age of Onset   Breast cancer Mother    Cancer - Cervical Mother    Colon cancer Father    Colon cancer Brother     ALLERGIES:  has No Known Allergies.  MEDICATIONS:  Current Outpatient Medications  Medication Sig Dispense Refill   albuterol (PROVENTIL) (2.5 MG/3ML) 0.083% nebulizer solution Take 3 mLs (2.5 mg total) by nebulization every 6 (six) hours  as needed for wheezing or shortness of breath. 120 mL 12   albuterol (VENTOLIN HFA) 108 (90 Base) MCG/ACT inhaler Inhale 2 puffs into the lungs as needed for wheezing or shortness of breath.     bacitracin ointment Apply 1 application topically every 8 (eight) hours. Apply around trach wound 120 g 0   Baclofen 5 MG TABS Take 1 tablet by mouth 3 (three) times daily as needed. 30 tablet 0   cetirizine (ZYRTEC) 10 MG tablet Take 10 mg by mouth daily.     dexamethasone (DECADRON) 4 MG tablet Take 2 tablets (8 mg total) by mouth daily. Take daily x 3 days starting the day after cisplatin chemotherapy. Take with food. 30 tablet 1   fluticasone (FLONASE) 50 MCG/ACT nasal spray Place 2 sprays into both nostrils daily. Place 1 spray each nostril at night 16 g 11   Fluticasone-Salmeterol (ADVAIR) 100-50 MCG/DOSE AEPB Inhale 1 puff into the lungs daily.     lidocaine (XYLOCAINE) 2 % solution Patient: Mix 1part 2% viscous lidocaine, 1part H20. Swish & swallow 59m of diluted mixture, 374m before meals and at bedtime, up to 4 times a day 200 mL 3   lidocaine-prilocaine (EMLA) cream Apply to affected area once as directed 30 g 3   lidocaine-prilocaine (EMLA) cream Apply to affected area once 30 g 3   Nutritional Supplements (FEEDING SUPPLEMENT, OSMOLITE 1.5 CAL,) LIQD Give 7 cartons Osmolite 1.5 via tube split over four feedings/day with 90 ml free water flush before and after each feeding. Drink by mouth or give via tube additional 4 cups fluids daily. Meets 92% kcal, 72% protein needs.  0   Nutritional Supplements (FEEDING SUPPLEMENT, PROSOURCE TF,) liquid Place 45 mLs into feeding tube 4 (four) times daily.     omeprazole (PRILOSEC) 40 MG capsule Take 40 mg by mouth daily.     ondansetron (ZOFRAN) 8 MG tablet Take 1 tablet (8 mg total) by mouth 2 (two) times daily as needed. Start on the third day after cisplatin chemotherapy. 30 tablet 1   prochlorperazine (COMPAZINE) 10 MG tablet Take 1 tablet (10 mg total)  by mouth every 6 (six) hours as needed (Nausea or vomiting). 30 tablet 1   scopolamine (TRANSDERM-SCOP) 1 MG/3DAYS Place 1 patch (1.5 mg total) onto the skin every 3 (three) days. 10 patch 3   No current facility-administered medications for this visit.   Facility-Administered Medications Ordered in Other Visits  Medication Dose Route Frequency Provider Last Rate Last Admin   magnesium sulfate  2 GM/50ML IVPB             PHYSICAL EXAMINATION: ECOG PERFORMANCE STATUS: 0 - Asymptomatic  Vitals:   10/25/20 0911  BP: 102/73  Pulse: 79  Resp: 17  Temp: 98.2 F (36.8 C)  SpO2: 99%   Filed Weights   10/25/20 0911  Weight: 270 lb 8 oz (122.7 kg)   Physical Exam Constitutional:      Appearance: Normal appearance. He is obese. He is not ill-appearing.  HENT:     Head: Normocephalic and atraumatic.     Mouth/Throat:     Mouth: Mucous membranes are moist.     Pharynx: Oropharynx is clear. Posterior oropharyngeal erythema present.     Comments: Thickened secretions noted  Cardiovascular:     Rate and Rhythm: Normal rate and regular rhythm.     Pulses: Normal pulses.     Heart sounds: Normal heart sounds.  Pulmonary:     Effort: Pulmonary effort is normal.     Breath sounds: Normal breath sounds.  Abdominal:     General: Abdomen is flat. Bowel sounds are normal.     Palpations: Abdomen is soft.  Musculoskeletal:        General: No swelling. Normal range of motion.     Cervical back: Normal range of motion.  Lymphadenopathy:     Cervical: Cervical adenopathy (Left sided cervical adenopathy continues to improve, almost resolved) present.  Skin:    General: Skin is warm and dry.  Neurological:     General: No focal deficit present.     Mental Status: He is alert.  Psychiatric:        Mood and Affect: Mood normal.        Behavior: Behavior normal.   LABORATORY DATA:  I have reviewed the data as listed Lab Results  Component Value Date   WBC 1.7 (L) 10/25/2020   HGB 10.4  (L) 10/25/2020   HCT 30.0 (L) 10/25/2020   MCV 101.0 (H) 10/25/2020   PLT 78 (L) 10/25/2020     Chemistry      Component Value Date/Time   NA 141 10/25/2020 0852   K 4.3 10/25/2020 0852   CL 105 10/25/2020 0852   CO2 27 10/25/2020 0852   BUN 14 10/25/2020 0852   CREATININE 1.09 10/25/2020 0852      Component Value Date/Time   CALCIUM 9.0 10/25/2020 0852   ALKPHOS 53 09/19/2020 0826   AST 17 09/19/2020 0826   ALT 15 09/19/2020 0826   BILITOT 0.6 09/19/2020 0826     RADIOGRAPHIC STUDIES: I have personally reviewed the radiological images as listed and agreed with the findings in the report. No results found.  All questions were answered.     Benay Pike, MD 10/25/2020 10:12 AM

## 2020-10-25 NOTE — Assessment & Plan Note (Signed)
Related to cisplatin.  Platelet count down from 81,000-78,000.  No stigmata of bleeding.  We will continue to monitor.

## 2020-10-26 ENCOUNTER — Ambulatory Visit
Admission: RE | Admit: 2020-10-26 | Discharge: 2020-10-26 | Disposition: A | Discharge status: Home / Self Care | Payer: PRIVATE HEALTH INSURANCE | Source: Ambulatory Visit | Attending: Radiation Oncology | Admitting: Radiation Oncology

## 2020-10-26 DIAGNOSIS — C159 Malignant neoplasm of esophagus, unspecified: Secondary | ICD-10-CM | POA: Diagnosis not present

## 2020-10-29 ENCOUNTER — Other Ambulatory Visit: Payer: Self-pay

## 2020-10-29 ENCOUNTER — Ambulatory Visit
Admission: RE | Admit: 2020-10-29 | Discharge: 2020-10-29 | Disposition: A | Discharge status: Home / Self Care | Payer: PRIVATE HEALTH INSURANCE | Source: Ambulatory Visit | Attending: Radiation Oncology | Admitting: Radiation Oncology

## 2020-10-29 DIAGNOSIS — C159 Malignant neoplasm of esophagus, unspecified: Secondary | ICD-10-CM

## 2020-10-29 MED ORDER — SONAFINE EX EMUL
1.0000 "application " | Freq: Two times a day (BID) | CUTANEOUS | Status: DC
  Administered 2020-10-29: 1 via TOPICAL

## 2020-10-30 ENCOUNTER — Ambulatory Visit
Admission: RE | Admit: 2020-10-30 | Discharge: 2020-10-30 | Disposition: A | Discharge status: Home / Self Care | Payer: PRIVATE HEALTH INSURANCE | Source: Ambulatory Visit | Attending: Radiation Oncology | Admitting: Radiation Oncology

## 2020-10-30 DIAGNOSIS — C159 Malignant neoplasm of esophagus, unspecified: Secondary | ICD-10-CM | POA: Diagnosis not present

## 2020-10-31 ENCOUNTER — Inpatient Hospital Stay: Payer: BC Managed Care – PPO

## 2020-10-31 ENCOUNTER — Ambulatory Visit
Admission: RE | Admit: 2020-10-31 | Discharge: 2020-10-31 | Disposition: A | Discharge status: Home / Self Care | Payer: PRIVATE HEALTH INSURANCE | Source: Ambulatory Visit | Attending: Radiation Oncology | Admitting: Radiation Oncology

## 2020-10-31 ENCOUNTER — Encounter: Payer: Self-pay | Admitting: Hematology and Oncology

## 2020-10-31 ENCOUNTER — Inpatient Hospital Stay: Payer: BC Managed Care – PPO | Admitting: Dietician

## 2020-10-31 ENCOUNTER — Inpatient Hospital Stay (HOSPITAL_BASED_OUTPATIENT_CLINIC_OR_DEPARTMENT_OTHER): Payer: BC Managed Care – PPO | Admitting: Hematology and Oncology

## 2020-10-31 ENCOUNTER — Other Ambulatory Visit: Payer: Self-pay

## 2020-10-31 DIAGNOSIS — C01 Malignant neoplasm of base of tongue: Secondary | ICD-10-CM

## 2020-10-31 DIAGNOSIS — T451X5A Adverse effect of antineoplastic and immunosuppressive drugs, initial encounter: Secondary | ICD-10-CM

## 2020-10-31 DIAGNOSIS — Z95828 Presence of other vascular implants and grafts: Secondary | ICD-10-CM

## 2020-10-31 DIAGNOSIS — C159 Malignant neoplasm of esophagus, unspecified: Secondary | ICD-10-CM | POA: Diagnosis not present

## 2020-10-31 DIAGNOSIS — D696 Thrombocytopenia, unspecified: Secondary | ICD-10-CM

## 2020-10-31 DIAGNOSIS — Z5111 Encounter for antineoplastic chemotherapy: Secondary | ICD-10-CM | POA: Diagnosis not present

## 2020-10-31 DIAGNOSIS — R634 Abnormal weight loss: Secondary | ICD-10-CM

## 2020-10-31 DIAGNOSIS — R5383 Other fatigue: Secondary | ICD-10-CM

## 2020-10-31 LAB — CBC WITH DIFFERENTIAL (CANCER CENTER ONLY)
Abs Immature Granulocytes: 0.01 10*3/uL (ref 0.00–0.07)
Basophils Absolute: 0 10*3/uL (ref 0.0–0.1)
Basophils Relative: 1 %
Eosinophils Absolute: 0 10*3/uL (ref 0.0–0.5)
Eosinophils Relative: 1 %
HCT: 31.1 % — ABNORMAL LOW (ref 39.0–52.0)
Hemoglobin: 10.8 g/dL — ABNORMAL LOW (ref 13.0–17.0)
Immature Granulocytes: 1 %
Lymphocytes Relative: 6 %
Lymphs Abs: 0.1 10*3/uL — ABNORMAL LOW (ref 0.7–4.0)
MCH: 35.4 pg — ABNORMAL HIGH (ref 26.0–34.0)
MCHC: 34.7 g/dL (ref 30.0–36.0)
MCV: 102 fL — ABNORMAL HIGH (ref 80.0–100.0)
Monocytes Absolute: 0.3 10*3/uL (ref 0.1–1.0)
Monocytes Relative: 14 %
Neutro Abs: 1.4 10*3/uL — ABNORMAL LOW (ref 1.7–7.7)
Neutrophils Relative %: 77 %
Platelet Count: 131 10*3/uL — ABNORMAL LOW (ref 150–400)
RBC: 3.05 MIL/uL — ABNORMAL LOW (ref 4.22–5.81)
RDW: 15.1 % (ref 11.5–15.5)
WBC Count: 1.8 10*3/uL — ABNORMAL LOW (ref 4.0–10.5)
nRBC: 0 % (ref 0.0–0.2)

## 2020-10-31 LAB — BASIC METABOLIC PANEL - CANCER CENTER ONLY
Anion gap: 8 (ref 5–15)
BUN: 21 mg/dL — ABNORMAL HIGH (ref 6–20)
CO2: 29 mmol/L (ref 22–32)
Calcium: 9.2 mg/dL (ref 8.9–10.3)
Chloride: 103 mmol/L (ref 98–111)
Creatinine: 0.97 mg/dL (ref 0.61–1.24)
GFR, Estimated: >60 mL/min (ref >60)
Glucose, Bld: 128 mg/dL — ABNORMAL HIGH (ref 70–99)
Potassium: 4.1 mmol/L (ref 3.5–5.1)
Sodium: 140 mmol/L (ref 135–145)

## 2020-10-31 LAB — MAGNESIUM: Magnesium: 2.1 mg/dL (ref 1.7–2.4)

## 2020-10-31 MED ORDER — SODIUM CHLORIDE 0.9 % IV SOLN
10.0000 mg | Freq: Once | INTRAVENOUS | Status: AC
  Administered 2020-10-31: 10 mg via INTRAVENOUS
  Filled 2020-10-31: qty 10

## 2020-10-31 MED ORDER — SODIUM CHLORIDE 0.9 % IV SOLN
150.0000 mg | Freq: Once | INTRAVENOUS | Status: AC
  Administered 2020-10-31: 150 mg via INTRAVENOUS
  Filled 2020-10-31: qty 150

## 2020-10-31 MED ORDER — SODIUM CHLORIDE 0.9% FLUSH
10.0000 mL | Freq: Once | INTRAVENOUS | Status: AC
  Administered 2020-10-31: 10 mL

## 2020-10-31 MED ORDER — MAGNESIUM SULFATE 2 GM/50ML IV SOLN
2.0000 g | Freq: Once | INTRAVENOUS | Status: AC
  Administered 2020-10-31: 2 g via INTRAVENOUS
  Filled 2020-10-31: qty 50

## 2020-10-31 MED ORDER — POTASSIUM CHLORIDE IN NACL 20-0.9 MEQ/L-% IV SOLN
Freq: Once | INTRAVENOUS | Status: AC
  Filled 2020-10-31: qty 1000

## 2020-10-31 MED ORDER — SODIUM CHLORIDE 0.9% FLUSH
10.0000 mL | INTRAVENOUS | Status: DC | PRN
  Administered 2020-10-31: 10 mL

## 2020-10-31 MED ORDER — SODIUM CHLORIDE 0.9 % IV SOLN: Freq: Once | INTRAVENOUS | Status: AC

## 2020-10-31 MED ORDER — HEPARIN SOD (PORK) LOCK FLUSH 100 UNIT/ML IV SOLN
500.0000 [IU] | Freq: Once | INTRAVENOUS | Status: AC | PRN
  Administered 2020-10-31: 500 [IU]

## 2020-10-31 MED ORDER — PALONOSETRON HCL INJECTION 0.25 MG/5ML
0.2500 mg | Freq: Once | INTRAVENOUS | Status: AC
  Administered 2020-10-31: 0.25 mg via INTRAVENOUS
  Filled 2020-10-31: qty 5

## 2020-10-31 MED ORDER — SODIUM CHLORIDE 0.9 % IV SOLN
38.5000 mg/m2 | Freq: Once | INTRAVENOUS | Status: AC
  Administered 2020-10-31: 100 mg via INTRAVENOUS
  Filled 2020-10-31: qty 100

## 2020-10-31 NOTE — Progress Notes (Signed)
See MD note from Dr Chryl Heck.  OK to treat despite low WBC/ANC.

## 2020-10-31 NOTE — Progress Notes (Signed)
[facility] FOLLOW UP NOTE  Patient Care Team: Pcp, No as PCP - General [place], Stephani Police, RN as Oncology Nurse Navigator Benay Pike, MD as Consulting Physician (Hematology and Oncology) Rozetta Nunnery, MD as Consulting Physician (Otolaryngology) Eppie Gibson, MD as Consulting Physician (Radiation Oncology)  CHIEF COMPLAINTS/PURPOSE OF CONSULTATION:   Pre chemo visit for concurrent cisplatin/radiation. Due for week 5, pre chemo visit.  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    He completed 4 cycles of 5-FU/carbo/Keytruda for induction.   He had remarkable response and no toxicity to report He was discussed in the head and neck tumor board and the plan was to consider concurrent chemoradiation to the neck and radiation to the mediastinum given oligometastatic disease.   He clearly understands this is not necessarily curative in intent however may give him prolonged progression free interval.   He started weekly cisplatin 09/19/2020 He received 4 cycles of weekly cisplatin, cycle 5 had to be delayed for 2 weeks given ongoing cytopenias with worsening thrombocytopenia.  His labs from today were reviewed, white blood cell count still low but ANC of 1400, hemoglobin and platelets definitely have improved hence we will proceed with cisplatin as planned today. He will get his last cycle of chemotherapy next week. Review of systems with ongoing fatigue and hoarseness. Physical examination with no concerning findings. Return to clinic in  1 week.  Thrombocytopenia ([place]) Likely secondary to antineoplastic therapy.  Platelet count has definitely improved compared to last visit.  Okay to proceed with chemo as planned.  Chemotherapy-induced fatigue Mild to moderate.  Will attempt to give 1 more cycle of chemotherapy next week if tolerated to finish 6 cycles of planned cisplatin.  Weight loss, unintentional Continue using G-tube for nourishment.  He is unable to eat or drink much by mouth.  His weight has dropped about 2 pounds since last visit.  We will continue to monitor.  No orders of the defined types were placed in this encounter.   HISTORY OF PRESENTING ILLNESS:   [name] 60 y.o. 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[place])  05/17/2020 Initial Diagnosis   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[place])   05/18/2020 Cancer Staging   Staging form: Pharynx - HPV-Mediated Oropharynx, AJCC 8th Edition - Clinical stage from 05/18/2020: cT4, cN3, p16+ - Signed by Eppie Gibson, MD on 05/22/2020   06/01/2020 Cancer Staging   Staging form: Pharynx - HPV-Mediated Oropharynx, AJCC 8th Edition - Pathologic stage from 06/01/2020: No Stage Recommended (cT4, cN3, cM1, p16+) - Signed by Benay Pike, MD on 06/01/2020 Stage prefix: Initial diagnosis   06/12/2020 - 08/21/2020 Chemotherapy          09/10/2020 - 09/10/2020 Chemotherapy          09/19/2020 -  Chemotherapy    Patient is on Treatment Plan: HEAD/NECK CISPLATIN Q7D        INTERIM HISTORY  Mr Schork is here for a follow up by himself. He received 4 cycles of carboplatin/5-FU and Keytruda for induction.  Given oligometastatic disease and excellent tolerance to induction chemo, we have decided to proceed with consolidation chemoradiation to the neck and palliative radiation to the mediastinum.   He is now on weekly cisplatin with radiation.   C1D1 09/19/2020 C2D1 09/27/2020 C3D1 10/03/2020 C4D1  10/10/2020 C5D1 planned for today but delayed.  Mr. Verdugo is here for follow-up.  He is feeling tired but able to do everything. He is using G tube for feeding,  2 containers of osmolite 3 times a day. Not able to eat much, he tried eating baked potato but couldn't swallow. No change in hearing, tinnitus, neuropathy. Urinating well, amber colored. No change in bowel habits, stool are softer, moving them twice a day Rest of the pertinent 10 point ROS reviewed and neg.  MEDICAL HISTORY:  Past Medical History:  Diagnosis Date   Asthma    Cancer ([place])    GERD (gastroesophageal reflux disease)    Sleep apnea    CPAP- does not wear    SURGICAL HISTORY: Past Surgical History:  Procedure Laterality Date   BRONCHIAL NEEDLE ASPIRATION BIOPSY  05/22/2020   Procedure: BRONCHIAL NEEDLE ASPIRATION BIOPSIES;  Surgeon: Garner Nash, DO;  Location: [facility];  Service: Pulmonary;;   DIRECT LARYNGOSCOPY  05/09/2020   Procedure: DIRECT LARYNGOSCOPY;  Surgeon: Rozetta Nunnery, MD;  Location: [facility];  Service: ENT;;   IR GASTROSTOMY TUBE MOD SED  09/13/2020   IR IMAGING GUIDED PORT INSERTION  06/12/2020   MASS EXCISION N/A 05/09/2020   Procedure: EXCISION TONGUE MASS;  Surgeon: Rozetta Nunnery, MD;  Location: [facility];  Service: ENT;  Laterality: N/A;   TONSILLECTOMY     TRACHEOSTOMY TUBE PLACEMENT  05/09/2020   Procedure: TRACHEOSTOMY;  Surgeon: Rozetta Nunnery, MD;  Location: [facility];  Service: ENT;;   VIDEO BRONCHOSCOPY WITH ENDOBRONCHIAL ULTRASOUND N/A 05/22/2020   Procedure: VIDEO BRONCHOSCOPY WITH ENDOBRONCHIAL ULTRASOUND;  Surgeon: Garner Nash, DO;  Location: [facility];  Service: Pulmonary;  Laterality: N/A;    SOCIAL HISTORY: Social History   Socioeconomic History   Marital status: Single    Spouse name: Not on file   Number of children: Not on file   Years of education: Not on file   Highest education level: Not on file  Occupational History   Not on file   Tobacco Use   Smoking status: Never   Smokeless tobacco: Never  Vaping Use   Vaping Use: Never used  Substance and Sexual Activity   Alcohol use: Not Currently    Comment: "4-5 drinks a year for special ocassions" 05/08/20   Drug use: Never   Sexual activity: Not Currently  Other Topics Concern   Not on file  Social History Narrative   Not on file   Social Determinants of Health   Financial Resource Strain: Low Risk    Difficulty of Paying Living Expenses: Not hard at all  Food Insecurity: No Food Insecurity   Worried About Charity fundraiser in the Last Year: Never true   [place] in the Last Year: Never true  Transportation Needs: No Transportation Needs   Lack of Transportation (Medical): No   Lack of Transportation (Non-Medical): No  Physical Activity: Not on file  Stress: No Stress Concern Present   Feeling of Stress : Not at all  Social Connections: Socially Isolated   Frequency of Communication with Friends and Family: More than three times a week   Frequency of Social Gatherings with Friends and Family:  More than three times a week   Attends Religious Services: Never   Active Member of Clubs or Organizations: No   Attends Archivist Meetings: Never   Marital Status: Separated  Intimate Partner Violence: Not on file    FAMILY HISTORY: Family History  Problem Relation Age of Onset   Breast cancer Mother    Cancer - Cervical Mother    Colon cancer Father    Colon cancer Brother     ALLERGIES:  has No Known Allergies.  MEDICATIONS:  Current Outpatient Medications  Medication Sig Dispense Refill   albuterol (PROVENTIL) (2.5 MG/3ML) 0.083% nebulizer solution Take 3 mLs (2.5 mg total) by nebulization every 6 (six) hours as needed for wheezing or shortness of breath. 120 mL 12   albuterol (VENTOLIN HFA) 108 (90 Base) MCG/ACT inhaler Inhale 2 puffs into the lungs as needed for wheezing or shortness of breath.     bacitracin ointment Apply 1  application topically every 8 (eight) hours. Apply around trach wound 120 g 0   Baclofen 5 MG TABS Take 1 tablet by mouth 3 (three) times daily as needed. 30 tablet 0   cetirizine (ZYRTEC) 10 MG tablet Take 10 mg by mouth daily.     dexamethasone (DECADRON) 4 MG tablet Take 2 tablets (8 mg total) by mouth daily. Take daily x 3 days starting the day after cisplatin chemotherapy. Take with food. 30 tablet 1   fluticasone (FLONASE) 50 MCG/ACT nasal spray Place 2 sprays into both nostrils daily. Place 1 spray each nostril at night 16 g 11   lidocaine (XYLOCAINE) 2 % solution Patient: Mix 1part 2% viscous lidocaine, 1part H20. Swish & swallow 75m of diluted mixture, 361m before meals and at bedtime, up to 4 times a day 200 mL 3   lidocaine-prilocaine (EMLA) cream Apply to affected area once as directed 30 g 3   lidocaine-prilocaine (EMLA) cream Apply to affected area once 30 g 3   Nutritional Supplements (FEEDING SUPPLEMENT, OSMOLITE 1.5 CAL,) LIQD Give 7 cartons Osmolite 1.5 via tube split over four feedings/day with 90 ml free water flush before and after each feeding. Drink by mouth or give via tube additional 4 cups fluids daily. Meets 92% kcal, 72% protein needs.  0   Nutritional Supplements (FEEDING SUPPLEMENT, PROSOURCE TF,) liquid Place 45 mLs into feeding tube 4 (four) times daily.     omeprazole (PRILOSEC) 40 MG capsule Take 40 mg by mouth daily.     ondansetron (ZOFRAN) 8 MG tablet Take 1 tablet (8 mg total) by mouth 2 (two) times daily as needed. Start on the third day after cisplatin chemotherapy. 30 tablet 1   prochlorperazine (COMPAZINE) 10 MG tablet Take 1 tablet (10 mg total) by mouth every 6 (six) hours as needed (Nausea or vomiting). 30 tablet 1   scopolamine (TRANSDERM-SCOP) 1 MG/3DAYS Place 1 patch (1.5 mg total) onto the skin every 3 (three) days. 10 patch 3   Fluticasone-Salmeterol (ADVAIR) 100-50 MCG/DOSE AEPB Inhale 1 puff into the lungs daily. (Patient not taking: Reported on  10/31/2020)     No current facility-administered medications for this visit.   Facility-Administered Medications Ordered in Other Visits  Medication Dose Route Frequency Provider Last Rate Last Admin   magnesium sulfate 2 GM/50ML IVPB             PHYSICAL EXAMINATION: ECOG PERFORMANCE STATUS: 0 - Asymptomatic  Vitals:   10/31/20 0848  BP: 99/70  Pulse: 91  Resp: 18  Temp: 98 F (  36.7 C)  SpO2: 97%   Filed Weights   10/31/20 0848  Weight: 268 lb 3.2 oz (121.7 kg)   Physical Exam Constitutional:      Appearance: Normal appearance. He is obese. He is not ill-appearing.  HENT:     Head: Normocephalic and atraumatic.     Mouth/Throat:     Mouth: Mucous membranes are moist.     Pharynx: Oropharynx is clear.     Comments: Thickened secretions noted  Cardiovascular:     Rate and Rhythm: Normal rate and regular rhythm.     Pulses: Normal pulses.     Heart sounds: Normal heart sounds.  Pulmonary:     Effort: Pulmonary effort is normal.     Breath sounds: Normal breath sounds.  Abdominal:     General: Abdomen is flat. Bowel sounds are normal.     Palpations: Abdomen is soft.  Musculoskeletal:        General: No swelling. Normal range of motion.     Cervical back: Normal range of motion.  Lymphadenopathy:     Cervical: Cervical adenopathy (Left sided cervical adenopathy not palpable on exam today) present.  Skin:    General: Skin is warm and dry.  Neurological:     General: No focal deficit present.     Mental Status: He is alert.  Psychiatric:        Mood and Affect: Mood normal.        Behavior: Behavior normal.   LABORATORY DATA:  I have reviewed the data as listed Lab Results  Component Value Date   WBC 1.8 (L) 10/31/2020   HGB 10.8 (L) 10/31/2020   HCT 31.1 (L) 10/31/2020   MCV 102.0 (H) 10/31/2020   PLT 131 (L) 10/31/2020     Chemistry      Component Value Date/Time   NA 140 10/31/2020 0826   K 4.1 10/31/2020 0826   CL 103 10/31/2020 0826   CO2 29  10/31/2020 0826   BUN 21 (H) 10/31/2020 0826   CREATININE 0.97 10/31/2020 0826      Component Value Date/Time   CALCIUM 9.2 10/31/2020 0826   ALKPHOS 53 09/19/2020 0826   AST 17 09/19/2020 0826   ALT 15 09/19/2020 0826   BILITOT 0.6 09/19/2020 0826     RADIOGRAPHIC STUDIES: I have personally reviewed the radiological images as listed and agreed with the findings in the report. No results found.  All questions were answered.     Benay Pike, MD 10/31/2020 9:20 AM

## 2020-10-31 NOTE — Assessment & Plan Note (Signed)
Likely secondary to antineoplastic therapy.  Platelet count has definitely improved compared to last visit.  Okay to proceed with chemo as planned.

## 2020-10-31 NOTE — Assessment & Plan Note (Signed)
Mild to moderate.  Will attempt to give 1 more cycle of chemotherapy next week if tolerated to finish 6 cycles of planned cisplatin.

## 2020-10-31 NOTE — Progress Notes (Signed)
Nutrition Follow-up:  Patient receiving concurrent chemoradiation for tongue cancer. He is s/p PEG on 7/18  Met with patient during infusion. He denies nutrition impact symptoms other than "everything tasting nasty" Patient is using baking soda, salt water rinses several times daily, says this does not make food taste better. Patient is relying on tube for nutrition/hydration needs. He reports giving 2 cartons Osmolite 1.5 and 30 ml Prosource via tube three times daily. Patient reports he is tolerating this well, says his stools are more loose, denies diarrhea. Patient does not know if he could increase to goal of 7 cartons, he reports tube feedings are time consuming. Patient states when one feeding is done, it is already time for anther one. Patient asking for time line of tube being removed once he is back to eating and drinking normally.   Medications: scopolamine, xylocaine, zofran, compazine  Labs: Glucose 128, BUN 21  Anthropometrics: Weight 268 lb 3.2 oz today decreased 2.5 lbs in the last week and 12 lbs since 8/17.   9/8 - 270 lb 8 oz 8/31 - 273 lb 7 oz 8/24 - 279 lb 1.6 oz 8/17 - 280 lb 8/11 - 280 lb 6.4 oz  Estimated Energy Needs  Kcals: 2700-3150 Protein: 145-160 Fluid: 3 L  NUTRITION DIAGNOSIS: Inadequate oral intake ongoing, patient relying on tube   INTERVENTION:  Reinforced importance of adequate calories and protein to maintain weights, strength, nutrition Provided support and encouragement to increase Osmolite to goal of 7 cartons/day Reviewed strategies for thick saliva Continue baking soda, salt water rinses several times daily Patient reports he has plenty of formula and supplies Patient has contact information    MONITORING, EVALUATION, GOAL: weight trends, intake, tube feeding   NEXT VISIT: To be scheduled with infusion    

## 2020-10-31 NOTE — Assessment & Plan Note (Signed)
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    He completed 4 cycles of 5-FU/carbo/Keytruda for induction.   He had remarkable response and no toxicity to report He was discussed in the head and neck tumor board and the plan was to consider concurrent chemoradiation to the neck and radiation to the mediastinum given oligometastatic disease.   He clearly understands this is not necessarily curative in intent however may give him prolonged progression free interval.   He started weekly cisplatin 09/19/2020 He received 4 cycles of weekly cisplatin, cycle 5 had to be delayed for 2 weeks given ongoing cytopenias with worsening thrombocytopenia.  His labs from today were reviewed, white blood cell count still low but ANC of 1400, hemoglobin and platelets definitely have improved hence we will proceed with cisplatin as planned today. He will get his last cycle of chemotherapy next week. Review of systems with ongoing fatigue and hoarseness. Physical examination with no concerning findings. Return to clinic in 1 week.

## 2020-10-31 NOTE — Patient Instructions (Signed)
[name]  Discharge Instructions: Thank you for choosing [facility] to provide your oncology and hematology care.   If you have a lab appointment with the Cancer Center, please go directly to the Cancer Center and check in at the registration area.   Wear comfortable clothing and clothing appropriate for easy access to any Portacath or PICC line.   We strive to give you quality time with your provider. You may need to reschedule your appointment if you arrive late (15 or more minutes).  Arriving late affects you and other patients whose appointments are after yours.  Also, if you miss three or more appointments without notifying the office, you may be dismissed from the clinic at the provider's discretion.      For prescription refill requests, have your pharmacy contact our office and allow 72 hours for refills to be completed.    Today you received the following chemotherapy and/or immunotherapy agents : Cisplatin    To help prevent nausea and vomiting after your treatment, we encourage you to take your nausea medication as directed.  BELOW ARE SYMPTOMS THAT SHOULD BE REPORTED IMMEDIATELY: *FEVER GREATER THAN 100.4 F (38 C) OR HIGHER *CHILLS OR SWEATING *NAUSEA AND VOMITING THAT IS NOT CONTROLLED WITH YOUR NAUSEA MEDICATION *UNUSUAL SHORTNESS OF BREATH *UNUSUAL BRUISING OR BLEEDING *URINARY PROBLEMS (pain or burning when urinating, or frequent urination) *BOWEL PROBLEMS (unusual diarrhea, constipation, pain near the anus) TENDERNESS IN MOUTH AND THROAT WITH OR WITHOUT PRESENCE OF ULCERS (sore throat, sores in mouth, or a toothache) UNUSUAL RASH, SWELLING OR PAIN  UNUSUAL VAGINAL DISCHARGE OR ITCHING   Items with * indicate a potential emergency and should be followed up as soon as possible or go to the Emergency Department if any problems should occur.  Please show the CHEMOTHERAPY ALERT CARD or IMMUNOTHERAPY ALERT CARD at check-in to  the Emergency Department and triage nurse.  Should you have questions after your visit or need to cancel or reschedule your appointment, please contact [facility]  Dept: [phone]  and follow the prompts.  Office hours are 8:00 a.m. to 4:30 p.m. Monday - Friday. Please note that voicemails left after 4:00 p.m. may not be returned until the following business day.  We are closed weekends and major holidays. You have access to a nurse at all times for urgent questions. Please call the main number to the clinic Dept: [phone] and follow the prompts.   For any non-urgent questions, you may also contact your provider using MyChart. We now offer e-Visits for anyone 18 and older to request care online for non-urgent symptoms. For details visit [email or web address].   Also download the MyChart app! Go to the app store, search "MyChart", open the app, select [facility], and log in with your MyChart username and password.  Due to Covid, a mask is required upon entering the hospital/clinic. If you do not have a mask, one will be given to you upon arrival. For doctor visits, patients may have 1 support person aged 18 or older with them. For treatment visits, patients cannot have anyone with them due to current Covid guidelines and our immunocompromised population.   

## 2020-10-31 NOTE — Assessment & Plan Note (Signed)
Continue using G-tube for nourishment.  He is unable to eat or drink much by mouth.  His weight has dropped about 2 pounds since last visit.  We will continue to monitor.

## 2020-11-01 ENCOUNTER — Ambulatory Visit
Admission: RE | Admit: 2020-11-01 | Discharge: 2020-11-01 | Disposition: A | Discharge status: Home / Self Care | Payer: PRIVATE HEALTH INSURANCE | Source: Ambulatory Visit | Attending: Radiation Oncology | Admitting: Radiation Oncology

## 2020-11-01 DIAGNOSIS — C159 Malignant neoplasm of esophagus, unspecified: Secondary | ICD-10-CM | POA: Diagnosis not present

## 2020-11-02 ENCOUNTER — Other Ambulatory Visit: Payer: Self-pay

## 2020-11-02 ENCOUNTER — Ambulatory Visit
Admission: RE | Admit: 2020-11-02 | Discharge: 2020-11-02 | Disposition: A | Discharge status: Home / Self Care | Payer: PRIVATE HEALTH INSURANCE | Source: Ambulatory Visit | Attending: Radiation Oncology | Admitting: Radiation Oncology

## 2020-11-02 DIAGNOSIS — C159 Malignant neoplasm of esophagus, unspecified: Secondary | ICD-10-CM | POA: Diagnosis not present

## 2020-11-05 ENCOUNTER — Ambulatory Visit
Admission: RE | Admit: 2020-11-05 | Discharge: 2020-11-05 | Disposition: A | Discharge status: Home / Self Care | Payer: PRIVATE HEALTH INSURANCE | Source: Ambulatory Visit | Attending: Radiation Oncology | Admitting: Radiation Oncology

## 2020-11-05 ENCOUNTER — Other Ambulatory Visit: Payer: Self-pay

## 2020-11-05 DIAGNOSIS — C159 Malignant neoplasm of esophagus, unspecified: Secondary | ICD-10-CM | POA: Diagnosis not present

## 2020-11-06 ENCOUNTER — Ambulatory Visit
Admission: RE | Admit: 2020-11-06 | Discharge: 2020-11-06 | Disposition: A | Discharge status: Home / Self Care | Payer: PRIVATE HEALTH INSURANCE | Source: Ambulatory Visit | Attending: Radiation Oncology | Admitting: Radiation Oncology

## 2020-11-06 DIAGNOSIS — C159 Malignant neoplasm of esophagus, unspecified: Secondary | ICD-10-CM | POA: Diagnosis not present

## 2020-11-07 ENCOUNTER — Encounter: Payer: Self-pay | Admitting: Radiation Oncology

## 2020-11-07 ENCOUNTER — Other Ambulatory Visit: Payer: BC Managed Care – PPO

## 2020-11-07 ENCOUNTER — Other Ambulatory Visit: Payer: Self-pay

## 2020-11-07 ENCOUNTER — Ambulatory Visit
Admission: RE | Admit: 2020-11-07 | Discharge: 2020-11-07 | Disposition: A | Discharge status: Home / Self Care | Payer: PRIVATE HEALTH INSURANCE | Source: Ambulatory Visit | Attending: Radiation Oncology | Admitting: Radiation Oncology

## 2020-11-07 ENCOUNTER — Ambulatory Visit: Payer: BC Managed Care – PPO | Admitting: Hematology and Oncology

## 2020-11-07 DIAGNOSIS — C159 Malignant neoplasm of esophagus, unspecified: Secondary | ICD-10-CM | POA: Diagnosis not present

## 2020-11-07 NOTE — Progress Notes (Signed)
[place] FOLLOW UP NOTE  Patient Care Team: Pcp, No as PCP - General Malmfelt, Stephani Police, RN as Oncology Nurse Navigator Benay Pike, MD as Consulting Physician (Hematology and Oncology) Rozetta Nunnery, MD as Consulting Physician (Otolaryngology) Eppie Gibson, MD as Consulting Physician (Radiation Oncology)  CHIEF COMPLAINTS/PURPOSE OF CONSULTATION:   Pre chemo visit for concurrent cisplatin/radiation. Due for week 5, pre chemo visit.  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    He completed 4 cycles of 5-FU/carbo/Keytruda for induction.   He had remarkable response and no toxicity to report He was discussed in the head and neck tumor board and the plan was to consider concurrent chemoradiation to the neck and radiation to the mediastinum given oligometastatic disease.   He clearly understands this is not necessarily curative in intent however may give him prolonged progression free interval.   He started weekly cisplatin 09/19/2020 He received 5 cycles of weekly cisplatin, cycle 5 had to be delayed for 2 weeks given ongoing cytopenias with worsening thrombocytopenia.  He is due for a planned weekly cycle 6 of cisplatin. Labs from today are adequate to proceed with treatment tomorrow. Physical examination without any concerning signs. Return to clinic in about 4 weeks.   Chemotherapy-induced fatigue Mild to moderate. He is still independent with all his ADL's. Will continue to monitor. Expect to improve in 2- 4 weeks from  completion of treatment.  Leukopenia due to antineoplastic chemotherapy (HCC) Leukopenia related to chemotherapy. Ok to proceed with chemo as scheduled.   Weight loss, unintentional He is currently using G-tube, using 2 cans of Osmolite a day TID along with Prosource as instructed.  He is unable to eat because he has no taste and the thickened saliva does not help.  He was unable He is following dietary recommendations as instructed. We will continue to monitor the weight.  No orders of the defined types were placed in this encounter.   HISTORY OF PRESENTING ILLNESS:   Alexander [name] 60 y.o. 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[place])  05/17/2020 Initial Diagnosis   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[place])   05/18/2020 Cancer Staging   Staging form: Pharynx - HPV-Mediated Oropharynx, AJCC 8th Edition - Clinical stage from 05/18/2020: cT4, cN3, p16+ - Signed by Eppie Gibson, MD on 05/22/2020   06/01/2020 Cancer Staging   Staging form: Pharynx - HPV-Mediated Oropharynx, AJCC 8th Edition - Pathologic stage from 06/01/2020: No Stage Recommended (cT4, cN3, cM1, p16+) - Signed by Benay Pike, MD on 06/01/2020 Stage prefix: Initial diagnosis   06/12/2020 - 08/21/2020 Chemotherapy          09/10/2020 - 09/10/2020 Chemotherapy          09/19/2020 -  Chemotherapy    Patient is on Treatment Plan: HEAD/NECK CISPLATIN Q7D        INTERIM HISTORY  Alexander [name] is here for a follow up by himself. He received 4 cycles of carboplatin/5-FU and Keytruda for induction.   Given oligometastatic disease and excellent tolerance to induction chemo,  we have decided to proceed with consolidation chemoradiation to the neck and palliative radiation to the mediastinum.    He is now on weekly cisplatin with radiation.   C1D1 09/19/2020 C2D1 09/27/2020 C3D1 10/03/2020 C4D1 10/10/2020 C5D1 completed 10/31/2020, delayed due to  cytopenias.  Patient is here for follow-up.  Since last visit, he continues to feel very tired.  Besides fatigue, he notices some sore throat but still denies any pain in his throat.   He is using G-tube for feeding, lost about 5 pounds since last visit.   He is using the G-tube for nourishment as instructed. He had 1 episode of nausea vomiting yesterday, no nausea. No ringing noise, neuropathy or hearing loss.  Rest of the pertinent 10 point ROS reviewed and neg.  MEDICAL HISTORY:  Past Medical History:  Diagnosis Date   Asthma    Cancer ([place])    GERD (gastroesophageal reflux disease)    Sleep apnea    CPAP- does not wear    SURGICAL HISTORY: Past Surgical History:  Procedure Laterality Date   BRONCHIAL NEEDLE ASPIRATION BIOPSY  05/22/2020   Procedure: BRONCHIAL NEEDLE ASPIRATION BIOPSIES;  Surgeon: Garner Nash, DO;  Location: [facility];  Service: Pulmonary;;   DIRECT LARYNGOSCOPY  05/09/2020   Procedure: DIRECT LARYNGOSCOPY;  Surgeon: Rozetta Nunnery, MD;  Location: [facility];  Service: ENT;;   IR GASTROSTOMY TUBE MOD SED  09/13/2020   IR IMAGING GUIDED PORT INSERTION  06/12/2020   MASS EXCISION N/A 05/09/2020   Procedure: EXCISION TONGUE MASS;  Surgeon: Rozetta Nunnery, MD;  Location: [facility] OR;  Service: ENT;  Laterality: N/A;   TONSILLECTOMY     TRACHEOSTOMY TUBE PLACEMENT  05/09/2020   Procedure: TRACHEOSTOMY;  Surgeon: Rozetta Nunnery, MD;  Location: [facility];  Service: ENT;;   VIDEO BRONCHOSCOPY WITH ENDOBRONCHIAL ULTRASOUND N/A 05/22/2020   Procedure: VIDEO BRONCHOSCOPY WITH ENDOBRONCHIAL ULTRASOUND;  Surgeon: Garner Nash, DO;  Location: [facility];  Service: Pulmonary;  Laterality: N/A;    SOCIAL HISTORY: Social History   Socioeconomic History   Marital status: Single    Spouse name: Not on file   Number of children: Not on file   Years of education: Not on file   Highest education level: Not on file  Occupational History   Not on file  Tobacco  Use   Smoking status: Never   Smokeless tobacco: Never  Vaping Use   Vaping Use: Never used  Substance and Sexual Activity   Alcohol use: Not Currently    Comment: "4-5 drinks a year for special ocassions" 05/08/20   Drug use: Never   Sexual activity: Not Currently  Other Topics Concern   Not on file  Social History Narrative   Not on file   Social Determinants of Health   Financial Resource Strain: Low Risk    Difficulty of Paying Living Expenses: Not hard at all  Food Insecurity: No Food Insecurity   Worried About Charity fundraiser in the Last Year: Never true   [place] in the Last Year: Never true  Transportation Needs: No Transportation Needs   Lack of Transportation (Medical): No   Lack of Transportation (Non-Medical): No  Physical Activity: Not on file  Stress: No Stress Concern Present   Feeling of Stress : Not at all  Social Connections: Socially Isolated   Frequency of Communication with Friends and Family: More than three times a week   Frequency of Social Gatherings with Friends and Family:  More than three times a week   Attends Religious Services: Never   Active Member of Clubs or Organizations: No   Attends Archivist Meetings: Never   Marital Status: Separated  Intimate Partner Violence: Not on file    FAMILY HISTORY: Family History  Problem Relation Age of Onset   Breast cancer Mother    Cancer - Cervical Mother    Colon cancer Father    Colon cancer Brother     ALLERGIES:  has No Known Allergies.  MEDICATIONS:  Current Outpatient Medications  Medication Sig Dispense Refill   albuterol (PROVENTIL) (2.5 MG/3ML) 0.083% nebulizer solution Take 3 mLs (2.5 mg total) by nebulization every 6 (six) hours as needed for wheezing or shortness of breath. 120 mL 12   albuterol (VENTOLIN HFA) 108 (90 Base) MCG/ACT inhaler Inhale 2 puffs into the lungs as needed for wheezing or shortness of breath.     bacitracin ointment Apply 1 application  topically every 8 (eight) hours. Apply around trach wound 120 g 0   Baclofen 5 MG TABS Take 1 tablet by mouth 3 (three) times daily as needed. 30 tablet 0   cetirizine (ZYRTEC) 10 MG tablet Take 10 mg by mouth daily.     dexamethasone (DECADRON) 4 MG tablet Take 2 tablets (8 mg total) by mouth daily. Take daily x 3 days starting the day after cisplatin chemotherapy. Take with food. 30 tablet 1   fluticasone (FLONASE) 50 MCG/ACT nasal spray Place 2 sprays into both nostrils daily. Place 1 spray each nostril at night 16 g 11   Fluticasone-Salmeterol (ADVAIR) 100-50 MCG/DOSE AEPB Inhale 1 puff into the lungs daily.     lidocaine (XYLOCAINE) 2 % solution Patient: Mix 1part 2% viscous lidocaine, 1part H20. Swish & swallow 68mL of diluted mixture, 42min before meals and at bedtime, up to 4 times a day 200 mL 3   lidocaine-prilocaine (EMLA) cream Apply to affected area once as directed 30 g 3   lidocaine-prilocaine (EMLA) cream Apply to affected area once 30 g 3   Nutritional Supplements (FEEDING SUPPLEMENT, OSMOLITE 1.5 CAL,) LIQD Give 7 cartons Osmolite 1.5 via tube split over four feedings/day with 90 ml free water flush before and after each feeding. Drink by mouth or give via tube additional 4 cups fluids daily. Meets 92% kcal, 72% protein needs.  0   Nutritional Supplements (FEEDING SUPPLEMENT, PROSOURCE TF,) liquid Place 45 mLs into feeding tube 4 (four) times daily.     omeprazole (PRILOSEC) 40 MG capsule Take 40 mg by mouth daily.     ondansetron (ZOFRAN) 8 MG tablet Take 1 tablet (8 mg total) by mouth 2 (two) times daily as needed. Start on the third day after cisplatin chemotherapy. 30 tablet 1   prochlorperazine (COMPAZINE) 10 MG tablet Take 1 tablet (10 mg total) by mouth every 6 (six) hours as needed (Nausea or vomiting). 30 tablet 1   scopolamine (TRANSDERM-SCOP) 1 MG/3DAYS Place 1 patch (1.5 mg total) onto the skin every 3 (three) days. (Patient not taking: Reported on 11/08/2020) 10 patch 3    No current facility-administered medications for this visit.   Facility-Administered Medications Ordered in Other Visits  Medication Dose Route Frequency Provider Last Rate Last Admin   magnesium sulfate 2 GM/50ML IVPB             PHYSICAL EXAMINATION: ECOG PERFORMANCE STATUS: 0 - Asymptomatic  Vitals:   11/08/20 0812  BP: 102/69  Pulse: 89  Resp: 18  Temp: 97.8 F (  36.6 C)  SpO2: 100%   Filed Weights   11/08/20 0812  Weight: 263 lb 4.8 oz (119.4 kg)   Physical Exam Constitutional:      Appearance: Normal appearance. He is obese. He is not ill-appearing.  HENT:     Head: Normocephalic and atraumatic.  Cardiovascular:     Rate and Rhythm: Normal rate and regular rhythm.     Pulses: Normal pulses.     Heart sounds: Normal heart sounds.  Pulmonary:     Effort: Pulmonary effort is normal.     Breath sounds: Normal breath sounds.  Abdominal:     General: Abdomen is flat. Bowel sounds are normal.     Palpations: Abdomen is soft.     Comments: G tube site looks well.  Musculoskeletal:        General: No swelling. Normal range of motion.     Cervical back: Normal range of motion.  Lymphadenopathy:     Cervical: Cervical adenopathy (Left sided cervical adenopathy not palpable on exam today) present.  Skin:    General: Skin is warm and dry.  Neurological:     General: No focal deficit present.     Mental Status: He is alert.  Psychiatric:        Mood and Affect: Mood normal.        Behavior: Behavior normal.   LABORATORY DATA:  I have reviewed the data as listed Lab Results  Component Value Date   WBC 2.8 (L) 11/08/2020   HGB 11.1 (L) 11/08/2020   HCT 32.2 (L) 11/08/2020   MCV 102.5 (H) 11/08/2020   PLT 236 11/08/2020     Chemistry      Component Value Date/Time   NA 136 11/08/2020 0750   K 4.1 11/08/2020 0750   CL 101 11/08/2020 0750   CO2 25 11/08/2020 0750   BUN 19 11/08/2020 0750   CREATININE 1.23 11/08/2020 0750      Component Value Date/Time    CALCIUM 9.6 11/08/2020 0750   ALKPHOS 53 09/19/2020 0826   AST 17 09/19/2020 0826   ALT 15 09/19/2020 0826   BILITOT 0.6 09/19/2020 0826     RADIOGRAPHIC STUDIES: I have personally reviewed the radiological images as listed and agreed with the findings in the report. No results found.  All questions were answered.     Benay Pike, MD 11/08/2020 10:00 AM

## 2020-11-08 ENCOUNTER — Inpatient Hospital Stay: Payer: BC Managed Care – PPO

## 2020-11-08 ENCOUNTER — Inpatient Hospital Stay (HOSPITAL_BASED_OUTPATIENT_CLINIC_OR_DEPARTMENT_OTHER): Payer: BC Managed Care – PPO | Admitting: Hematology and Oncology

## 2020-11-08 ENCOUNTER — Encounter: Payer: Self-pay | Admitting: Hematology and Oncology

## 2020-11-08 DIAGNOSIS — Z5111 Encounter for antineoplastic chemotherapy: Secondary | ICD-10-CM | POA: Diagnosis not present

## 2020-11-08 DIAGNOSIS — C01 Malignant neoplasm of base of tongue: Secondary | ICD-10-CM

## 2020-11-08 DIAGNOSIS — D701 Agranulocytosis secondary to cancer chemotherapy: Secondary | ICD-10-CM | POA: Diagnosis not present

## 2020-11-08 DIAGNOSIS — R5383 Other fatigue: Secondary | ICD-10-CM | POA: Diagnosis not present

## 2020-11-08 DIAGNOSIS — R634 Abnormal weight loss: Secondary | ICD-10-CM | POA: Diagnosis not present

## 2020-11-08 DIAGNOSIS — T451X5A Adverse effect of antineoplastic and immunosuppressive drugs, initial encounter: Secondary | ICD-10-CM

## 2020-11-08 LAB — CBC WITH DIFFERENTIAL (CANCER CENTER ONLY)
Abs Immature Granulocytes: 0.05 10*3/uL (ref 0.00–0.07)
Basophils Absolute: 0 10*3/uL (ref 0.0–0.1)
Basophils Relative: 0 %
Eosinophils Absolute: 0 10*3/uL (ref 0.0–0.5)
Eosinophils Relative: 0 %
HCT: 32.2 % — ABNORMAL LOW (ref 39.0–52.0)
Hemoglobin: 11.1 g/dL — ABNORMAL LOW (ref 13.0–17.0)
Immature Granulocytes: 2 %
Lymphocytes Relative: 3 %
Lymphs Abs: 0.1 10*3/uL — ABNORMAL LOW (ref 0.7–4.0)
MCH: 35.4 pg — ABNORMAL HIGH (ref 26.0–34.0)
MCHC: 34.5 g/dL (ref 30.0–36.0)
MCV: 102.5 fL — ABNORMAL HIGH (ref 80.0–100.0)
Monocytes Absolute: 0.6 10*3/uL (ref 0.1–1.0)
Monocytes Relative: 20 %
Neutro Abs: 2.1 10*3/uL (ref 1.7–7.7)
Neutrophils Relative %: 75 %
Platelet Count: 236 10*3/uL (ref 150–400)
RBC: 3.14 MIL/uL — ABNORMAL LOW (ref 4.22–5.81)
RDW: 14.7 % (ref 11.5–15.5)
WBC Count: 2.8 10*3/uL — ABNORMAL LOW (ref 4.0–10.5)
nRBC: 0.7 % — ABNORMAL HIGH (ref 0.0–0.2)

## 2020-11-08 LAB — BASIC METABOLIC PANEL - CANCER CENTER ONLY
Anion gap: 10 (ref 5–15)
BUN: 19 mg/dL (ref 6–20)
CO2: 25 mmol/L (ref 22–32)
Calcium: 9.6 mg/dL (ref 8.9–10.3)
Chloride: 101 mmol/L (ref 98–111)
Creatinine: 1.23 mg/dL (ref 0.61–1.24)
GFR, Estimated: >60 mL/min (ref >60)
Glucose, Bld: 125 mg/dL — ABNORMAL HIGH (ref 70–99)
Potassium: 4.1 mmol/L (ref 3.5–5.1)
Sodium: 136 mmol/L (ref 135–145)

## 2020-11-08 LAB — MAGNESIUM: Magnesium: 2.1 mg/dL (ref 1.7–2.4)

## 2020-11-08 NOTE — Progress Notes (Signed)
Oncology Nurse Navigator Documentation   Met with Mr. Arch during his appointment with Dr. Iruku today to offer support and to celebrate his end of radiation treatment on 9/21. Provided verbal post-RT guidance: Importance of keeping all follow-up appts, especially those with Nutrition and SLP. Importance of protecting treatment area from sun. Continuation of Sonafine application 2-3 times daily, application of antibiotic ointment to areas of raw skin; when supply of Sonafine exhausted transition to OTC lotion with vitamin E. Reviewed Epic calendar of upcoming appts. Explained my role as navigator will continue for several more months, encouraged him to call me with needs/concerns.    [name] Malmfelt RN, BSN, OCN Head & Neck Oncology Nurse Navigator [facility] Phone # [phone]  Fax # [phone]   

## 2020-11-08 NOTE — Assessment & Plan Note (Signed)
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    He completed 4 cycles of 5-FU/carbo/Keytruda for induction.   He had remarkable response and no toxicity to report He was discussed in the head and neck tumor board and the plan was to consider concurrent chemoradiation to the neck and radiation to the mediastinum given oligometastatic disease.   He clearly understands this is not necessarily curative in intent however may give him prolonged progression free interval.   He started weekly cisplatin 09/19/2020 He received 5 cycles of weekly cisplatin, cycle 5 had to be delayed for 2 weeks given ongoing cytopenias with worsening thrombocytopenia.  He is due for a planned weekly cycle 6 of cisplatin. Labs from today are adequate to proceed with treatment tomorrow. Physical examination without any concerning signs. Return to clinic in about 4 weeks.

## 2020-11-08 NOTE — Assessment & Plan Note (Signed)
Leukopenia related to chemotherapy. Ok to proceed with chemo as scheduled.

## 2020-11-08 NOTE — Addendum Note (Signed)
Addended by: Adaline Sill on: 11/08/2020 04:25 PM   Modules accepted: Orders

## 2020-11-08 NOTE — Assessment & Plan Note (Signed)
Mild to moderate. He is still independent with all his ADL's. Will continue to monitor. Expect to improve in 2- 4 weeks from completion of treatment.

## 2020-11-08 NOTE — Assessment & Plan Note (Signed)
He is currently using G-tube, using 2 cans of Osmolite a day TID along with Prosource as instructed.  He is unable to eat because he has no taste and the thickened saliva does not help.  He was unable He is following dietary recommendations as instructed. We will continue to monitor the weight.

## 2020-11-09 ENCOUNTER — Inpatient Hospital Stay: Payer: BC Managed Care – PPO

## 2020-11-09 ENCOUNTER — Other Ambulatory Visit: Payer: Self-pay

## 2020-11-09 ENCOUNTER — Other Ambulatory Visit: Payer: BC Managed Care – PPO

## 2020-11-09 ENCOUNTER — Inpatient Hospital Stay: Payer: BC Managed Care – PPO | Admitting: Dietician

## 2020-11-09 VITALS — BP 97/65 | HR 99 | Temp 98.2°F | Resp 16

## 2020-11-09 DIAGNOSIS — Z95828 Presence of other vascular implants and grafts: Secondary | ICD-10-CM

## 2020-11-09 DIAGNOSIS — Z5111 Encounter for antineoplastic chemotherapy: Secondary | ICD-10-CM | POA: Diagnosis not present

## 2020-11-09 DIAGNOSIS — C01 Malignant neoplasm of base of tongue: Secondary | ICD-10-CM

## 2020-11-09 MED ORDER — SODIUM CHLORIDE 0.9 % IV SOLN
10.0000 mg | Freq: Once | INTRAVENOUS | Status: AC
  Administered 2020-11-09: 10 mg via INTRAVENOUS
  Filled 2020-11-09: qty 10

## 2020-11-09 MED ORDER — SODIUM CHLORIDE 0.9 % IV SOLN
150.0000 mg | Freq: Once | INTRAVENOUS | Status: AC
  Administered 2020-11-09: 150 mg via INTRAVENOUS
  Filled 2020-11-09: qty 150

## 2020-11-09 MED ORDER — PALONOSETRON HCL INJECTION 0.25 MG/5ML
0.2500 mg | Freq: Once | INTRAVENOUS | Status: AC
  Administered 2020-11-09: 0.25 mg via INTRAVENOUS
  Filled 2020-11-09: qty 5

## 2020-11-09 MED ORDER — SODIUM CHLORIDE 0.9% FLUSH
10.0000 mL | Freq: Once | INTRAVENOUS | Status: AC
  Administered 2020-11-09: 10 mL

## 2020-11-09 MED ORDER — SODIUM CHLORIDE 0.9% FLUSH
10.0000 mL | INTRAVENOUS | Status: DC | PRN
  Administered 2020-11-09: 10 mL

## 2020-11-09 MED ORDER — MAGNESIUM SULFATE 2 GM/50ML IV SOLN
2.0000 g | Freq: Once | INTRAVENOUS | Status: AC
  Administered 2020-11-09: 2 g via INTRAVENOUS

## 2020-11-09 MED ORDER — SODIUM CHLORIDE 0.9 % IV SOLN: Freq: Once | INTRAVENOUS | Status: AC

## 2020-11-09 MED ORDER — SODIUM CHLORIDE 0.9 % IV SOLN
38.5000 mg/m2 | Freq: Once | INTRAVENOUS | Status: AC
  Administered 2020-11-09: 100 mg via INTRAVENOUS
  Filled 2020-11-09: qty 100

## 2020-11-09 MED ORDER — HEPARIN SOD (PORK) LOCK FLUSH 100 UNIT/ML IV SOLN
500.0000 [IU] | Freq: Once | INTRAVENOUS | Status: AC | PRN
  Administered 2020-11-09: 500 [IU]

## 2020-11-09 MED ORDER — POTASSIUM CHLORIDE IN NACL 20-0.9 MEQ/L-% IV SOLN
Freq: Once | INTRAVENOUS | Status: AC
  Filled 2020-11-09: qty 1000

## 2020-11-09 NOTE — Patient Instructions (Signed)
[facility] MEDICAL ONCOLOGY  Discharge Instructions: Thank you for choosing [facility] to provide your oncology and hematology care.   If you have a lab appointment with the Cancer Center, please go directly to the Cancer Center and check in at the registration area.   Wear comfortable clothing and clothing appropriate for easy access to any Portacath or PICC line.   We strive to give you quality time with your provider. You may need to reschedule your appointment if you arrive late (15 or more minutes).  Arriving late affects you and other patients whose appointments are after yours.  Also, if you miss three or more appointments without notifying the office, you may be dismissed from the clinic at the provider's discretion.      For prescription refill requests, have your pharmacy contact our office and allow 72 hours for refills to be completed.    Today you received the following chemotherapy and/or immunotherapy agents : Cisplatin    To help prevent nausea and vomiting after your treatment, we encourage you to take your nausea medication as directed.  BELOW ARE SYMPTOMS THAT SHOULD BE REPORTED IMMEDIATELY: *FEVER GREATER THAN 100.4 F (38 C) OR HIGHER *CHILLS OR SWEATING *NAUSEA AND VOMITING THAT IS NOT CONTROLLED WITH YOUR NAUSEA MEDICATION *UNUSUAL SHORTNESS OF BREATH *UNUSUAL BRUISING OR BLEEDING *URINARY PROBLEMS (pain or burning when urinating, or frequent urination) *BOWEL PROBLEMS (unusual diarrhea, constipation, pain near the anus) TENDERNESS IN MOUTH AND THROAT WITH OR WITHOUT PRESENCE OF ULCERS (sore throat, sores in mouth, or a toothache) UNUSUAL RASH, SWELLING OR PAIN  UNUSUAL VAGINAL DISCHARGE OR ITCHING   Items with * indicate a potential emergency and should be followed up as soon as possible or go to the Emergency Department if any problems should occur.  Please show the CHEMOTHERAPY ALERT CARD or IMMUNOTHERAPY ALERT CARD at check-in to  the Emergency Department and triage nurse.  Should you have questions after your visit or need to cancel or reschedule your appointment, please contact [facility] MEDICAL ONCOLOG[facility]  Dept: [phone]  and follow the prompts.  Office hours are 8:00 a.m. to 4:30 p.m. Monday - Friday. Please note that voicemails left after 4:00 p.m. may not be returned until the following business day.  We are closed weekends and major holidays. You have access to a nurse at all times for urgent questions. Please call the main number to the clinic Dept: [phone] and follow the prompts.   For any non-urgent questions, you may also contact your provider using MyChart. We now offer e-Visits for anyone 18 and older to request care online for non-urgent symptoms. For details visit [email or web address].   Also download the MyChart app! Go to the app store, search "MyChart", open the app, select [facility], and log in with your MyChart username and password.  Due to Covid, a mask is required upon entering the hospital/clinic. If you do not have a mask, one will be given to you upon arrival. For doctor visits, patients may have 1 support person aged 18 or older with them. For treatment visits, patients cannot have anyone with them due to current Covid guidelines and our immunocompromised population.   

## 2020-11-09 NOTE — Progress Notes (Signed)
Nutrition Follow-up:  Patient receiving concurrent chemoradiation for tongue cancer. He completed radiation treatment 9/21.   Met with patient during last chemotherapy today. Patient is glad to be finishing with treatments, states he will be happy once he can taste foods again. Patient has been tolerating 2 cartons Osmolite 1.5 with prosource TID, however reports one episode of vomiting after giving morning tube feeding followed by diarrhea on 9/21. Patient did not give any additional feedings that day. Patient reports he thinks 2 cartons of tube feeding with prosource and water flushes is too much volume. Since single episode, he has been giving 1 carton Osmolite 1.5 with 30 ml prosource and 90 ml water flush before and after each feeding TID (1365 kcal, 74.7 g protein, 1083 ml total water). Patient feels tube feedings take up too much time and does not want to do 4 feedings everyday.   Medications: reviewed  Labs: 9/22 - glucose 125  Anthropometrics: Weight 263 lb 4.8 oz today decreased 1.9% (5 lbs) in 7 days and 5.7% (16 lbs) in the last month. This is significant.  9/14 - 263 lb 3.2 oz 9/8 - 270 lb 8 oz 8/31 - 273 lb 7 oz 8/24 - 279 lb 1.6 oz  Estimated Energy Needs  Kcals: 4481-8563 Protein: 145-160 Fluid: 3 L  NUTRITION DIAGNOSIS: Inadequate oral intake ongoing, pt relying on tube feedings   MALNUTRITION DIAGNOSIS: Given significant weight loss over in the last week and tube feedings meeting <75% of estimated needs patient meets criteria for moderate acute malnutrition.    INTERVENTION:  Reinforced importance of adequate calories and protein and continued increased needs to support healing  Educated patient on volume of tube feedings (1 cup per carton, 3/4 cup per 180 ml flush, 2 tablespoons per prosource)  Provided support and encouragement to increase Osmolite to goal of 7 cartons/day as tolerated and consider splitting over 4 feedings  Encouraged completing HEP exercises as  prescribed per SLP Patient has contact information   MONITORING, EVALUATION, GOAL: weight trends, intake   NEXT VISIT: Friday, September 30 via telephone

## 2020-11-14 ENCOUNTER — Ambulatory Visit: Payer: PRIVATE HEALTH INSURANCE

## 2020-11-16 ENCOUNTER — Telehealth: Payer: Self-pay | Admitting: Dietician

## 2020-11-16 ENCOUNTER — Other Ambulatory Visit: Payer: Self-pay

## 2020-11-16 ENCOUNTER — Inpatient Hospital Stay: Payer: BC Managed Care – PPO | Admitting: Dietician

## 2020-11-16 NOTE — Telephone Encounter (Signed)
Nutrition Follow-up:  Patient has completed concurrent chemoradiation for tongue cancer.   Spoke with patient via telephone. He reports "things are still a little rough" says he had 3 episodes of vomiting yesterday, one episode occurred after tube feeding. Patient has not increased tube feedings, continues giving 3 cartons/day with 90 ml water flush before and after each feeding and reports 4-6 ounces of prosource  (~1365 kcal, 75 g protein). He is drinking some water by mouth, states it tastes like crap. He had 4-6 spoonfuls of chicken noodle soup last night. Patient reports he is completing swallowing exercises the best he can, spends most of his day laying down. Patient reports loose BM daily, he has not moved his bowels today.   Medications: reviewed  Labs: no new labs for review  Anthropometrics: Last weight 263 lb 4.8 oz on 9/23 decreased 5 lbs (1.9%) from 268 lb 3.2 oz on 9/14 and 16 lbs (5.7%) in the last month. This is significant   Estimated Energy Needs  Kcals: 0539-7673 Protein: 145-160 Fluid: 3 L  NUTRITION DIAGNOSIS: Inadequate oral intake continues, pt relying on tube feedings   MALNUTRITION DIAGNOSIS: Suspect patient meets criteria for degree acute malnutrition given he has not increased tube feedings, continues to give 3 cartons/day meeting ~50% of estimated needs >7 days.  INTERVENTION:  Reinforced importance of adequate calories and protein to support healing Provided support and encouragement for patient to increase Osmolite 1.5 by 1 carton daily to goal of 7 cartons/day Reinforced importance of not lying down for 1 hour after tube feedings. Will consider switching tube feeding formula with further report of vomiting. Encouraged patient to continue trying variety of foods by mouth Reinforced importance of completing daily HEP exercises as prescribed per SLP Patient has contact information    MONITORING, EVALUATION, GOAL: weight trends, oral intake, tube  feeding   NEXT VISIT: Monday October 3 via telephone

## 2020-11-19 ENCOUNTER — Encounter: Payer: Self-pay | Admitting: Hematology and Oncology

## 2020-11-19 ENCOUNTER — Inpatient Hospital Stay (HOSPITAL_COMMUNITY): Payer: BC Managed Care – PPO | Attending: Dietician | Admitting: Dietician

## 2020-11-19 ENCOUNTER — Telehealth (HOSPITAL_COMMUNITY): Payer: Self-pay | Admitting: Dietician

## 2020-11-19 NOTE — Telephone Encounter (Signed)
Nutrition Follow-up:  Patient has completed concurrent chemoradiation therapy for tongue cancer.   Spoke with patient via telephone, says he is feeling pretty much the same. He denies further episodes of nausea or vomiting over the weekend. He has not increased tube feedings, continues giving 1 carton Osmolite 1.5 and 30 ml Prosource TID. He has been trying to eat more orally despite not being able to taste anything. Patient denies swallowing difficulties. Saturday he "managed to eat entire can of vegetable beef soup." Yesterday he had beef stew and couple slices of tomato as well as drank a bottle of green tea and glass of chocolate milk. Patient has given on tube feeding today, he has not eaten orally. Patient says bowel movements are fine.    Medications: reviewed  Labs: reviewed  Anthropometrics: Last weight 263 lb 4.8 oz on 9/23 decreased 5 lbs (1.9%) from 268 lb 3.2 oz on 9/14 and 16 lbs (5.7%) in the last month. This is significant    NUTRITION DIAGNOSIS: Inadequate oral intake continues, patient relying on tube feedings   INTERVENTION:  Reinforced importance of adequate calories and protein to support healing  Continued encouragement for patient to increase tube feedings. With improved oral intake - recommend 5 cartons Osmolite 1.5/day split over three feedings and 30 ml Prosource TID (2075 kcal, 104.5 grams protein) Encouraged patient to continue trying variety of foods by mouth Patient does not like oral nutrition supplements, continue drinking whole chocolate milk and recommended 2 glasses daily as tolerated for added calories and protein Patient has contact information     MONITORING, EVALUATION, GOAL: weight trends, intake, tube feedings   NEXT VISIT: Friday October 21 in clinic

## 2020-11-21 ENCOUNTER — Telehealth: Payer: Self-pay | Admitting: *Deleted

## 2020-11-21 ENCOUNTER — Encounter: Payer: Self-pay | Admitting: Physical Therapy

## 2020-11-21 ENCOUNTER — Other Ambulatory Visit: Payer: Self-pay

## 2020-11-21 ENCOUNTER — Ambulatory Visit: Payer: PRIVATE HEALTH INSURANCE | Attending: Radiation Oncology | Admitting: Physical Therapy

## 2020-11-21 DIAGNOSIS — R293 Abnormal posture: Secondary | ICD-10-CM | POA: Insufficient documentation

## 2020-11-21 DIAGNOSIS — C109 Malignant neoplasm of oropharynx, unspecified: Secondary | ICD-10-CM | POA: Insufficient documentation

## 2020-11-21 DIAGNOSIS — R1313 Dysphagia, pharyngeal phase: Secondary | ICD-10-CM | POA: Insufficient documentation

## 2020-11-21 NOTE — Telephone Encounter (Signed)
CALLED PATIENT TO ASK ABOUT RESCHEDULING FU ON 11-23-20 DUE TO DR. SQUIRE BEING SICK, RESCHEDULED FOR 12-07-20 2 11  AM, PATIENT AGREED TO NEW DATE AND TIME

## 2020-11-21 NOTE — Therapy (Signed)
[facility] @ [place], [place], [address] Phone:     Fax:     Physical Therapy Treatment  Patient Details  Name: Alexander [name] MRN: [id number] Date of Birth: [birth year] Referring Provider (PT): Reita May Date: 11/21/2020   PT End of Session - 11/21/20 1040     Visit Number 2    Number of Visits 2    Date for PT Re-Evaluation 11/22/20    PT Start Time 1007    PT Stop Time 1037    PT Time Calculation (min) 30 min    Activity Tolerance Patient tolerated treatment well    Behavior During Therapy [facility] for tasks assessed/performed             Past Medical History:  Diagnosis Date   Asthma    Cancer ([place])    GERD (gastroesophageal reflux disease)    Sleep apnea    CPAP- does not wear    Past Surgical History:  Procedure Laterality Date   BRONCHIAL NEEDLE ASPIRATION BIOPSY  05/22/2020   Procedure: BRONCHIAL NEEDLE ASPIRATION BIOPSIES;  Surgeon: Garner Nash, DO;  Location: [facility];  Service: Pulmonary;;   DIRECT LARYNGOSCOPY  05/09/2020   Procedure: DIRECT LARYNGOSCOPY;  Surgeon: Rozetta Nunnery, MD;  Location: [facility];  Service: ENT;;   IR GASTROSTOMY TUBE MOD SED  09/13/2020   IR IMAGING GUIDED PORT INSERTION  06/12/2020   MASS EXCISION N/A 05/09/2020   Procedure: EXCISION TONGUE MASS;  Surgeon: Rozetta Nunnery, MD;  Location: [facility] [facility];  Service: ENT;  Laterality: N/A;   TONSILLECTOMY     TRACHEOSTOMY TUBE PLACEMENT  05/09/2020   Procedure: TRACHEOSTOMY;  Surgeon: Rozetta Nunnery, MD;  Location: [facility];  Service: ENT;;   VIDEO BRONCHOSCOPY WITH ENDOBRONCHIAL ULTRASOUND N/A 05/22/2020   Procedure: VIDEO BRONCHOSCOPY WITH ENDOBRONCHIAL ULTRASOUND;  Surgeon: Garner Nash, DO;  Location: [place] [facility];  Service: Pulmonary;  Laterality: N/A;    There were no vitals filed for this visit.   Subjective Assessment - 11/21/20 1015     Subjective The feeding tube and trying to eat  have been challenging. I have the sores in my throat and it is hard between the thick saliva and the mucous. My neck ROM is good. I have not noticed any swelling. I did not have any raw skin from the radiation. Some days are a little better than others. I still have some fatigue. I am active. I move around, I go out.    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 That portion of the mass had poorly defined margins but appeared to cross the midline and was estimated to measure at least 5.0 x 4.7 x 3.4 cm. The epiglottis and aryeglottic folds were not well separately delineated and may have been involved by mass. Tumor extension was suspected inferiorly at least to the level of the supraglottic larynx, 05/11/20 CT chest showed subcarinal mass/adenopathy and low lung volumes with bibasilar atelectasis. However, there was no acute intrathoracic pathology, 05/03/20 He saw Dr. [name] who performed a fiberoptic laryngoscopy through the right nostril. Nasopharyngeal was clear, but the base of  the tongue was full on the left side and epiglottis was slightly pushed back posteriorly with questionable ulcer on the left base of the tongue extending down to the left vallecular area, biopsy of the base of the tongue on 05/09/2020 revealed: invasive squamous cell carcinoma.  Strongly p16 positive; emergent tracheostomy was also performed at that time, received induction chemotherapy of 3 cycles of Carbo/5 FU, will now receive 35 fractions of radiation to his Base of tongue and bilateral neck and 7 weeks of cisplatin chemotherapy. He will also receive radiation to his subcarinal node in his chest area. He started on 09/19/20  and will complete 11/07/20, PAC 06/12/20, PEG 09/13/20, Tracheostomy placed 05/09/20 and decannulated 08/31/20.    Patient Stated Goals to gain info from providers    Currently in Pain? No/denies    Pain Score 0-No pain                [facility] PT Assessment - 11/21/20 0001       Balance Screen   Has the patient fallen in the past 6 months No    Has the patient had a decrease in activity level because of a fear of falling?  No    Is the patient reluctant to leave their home because of a fear of falling?  No      Prior Function   Level of Independence Independent      Sit to Stand   Comments 30 sec sit to stand: 15 reps today      AROM   Cervical Flexion WFL    Cervical Extension WFL    Cervical - Right Side Bend WFL    Cervical - Left Side Bend WFL    Cervical - Right Rotation WFL    Cervical - Left Rotation WFL               LYMPHEDEMA/ONCOLOGY QUESTIONNAIRE - 11/21/20 0001       Head and Neck   4 cm superior to sternal notch around neck 43.8 cm    6 cm superior to sternal notch around neck 44.2 cm    8 cm superior to sternal notch around neck 46.5 cm                                      PT Long Term Goals - 11/21/20 1040       PT LONG TERM GOAL #1   Title Pt will demonstrate baseline cervical ROM and not demonstrate any signs or symptoms of lymphedema.    Time 9    Period Weeks    Status Achieved                   Plan - 11/21/20 1041     Clinical Impression Statement Pt returns to PT after completing chemo and radiation for treatment of base of tongue cancer. His cervical ROM is still WFL. He was able to complete 15 sit to stands this time in 30 sec compared to 12 at baseline. Pt does not demonstrate any signs or symptoms of lymphedema. He reports he has remained active throughout his treatment and does have some fatigue but that it is improving. Educated pt about importance of continuing to exercise to decrease fatigue  and to let his doctor know to refer him back to PT if he demonstrate any signs of lymphedema. Pt will be discharged from skilled PT services  at this time.    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[place])     Problem List Patient Active Problem List   Diagnosis Date Noted   Leukopenia due to antineoplastic chemotherapy ([place]) 11/08/2020   Port-A-Cath in place 10/03/2020   Intractable hiccups 10/03/2020   Thrombocytopenia ([place]) 10/03/2020   Chemotherapy-induced fatigue 09/27/2020   Weight loss, unintentional 06/01/2020   Sepsis due to undetermined organism ([place]) 05/28/2020   Malnutrition of moderate degree 05/28/2020   GERD (gastroesophageal reflux disease)    Volume depletion    Hyperbilirubinemia    Hyponatremia    Mild protein-calorie malnutrition ([place])    Squamous cell cancer of tongue ([facility])    Pressure ulcer caused by device    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[place]) 05/17/2020   Mediastinal adenopathy 05/17/2020   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, [place], [address] Phone:     Fax:     Name: Alexander [name] MRN: [id number] Date of Birth:  [birth year]   PHYSICAL THERAPY DISCHARGE SUMMARY  Visits from Start of Care: 2  Current functional level related to goals / functional outcomes: All goals met   Remaining deficits: none   Education / Equipment: HEP, lymphedema education   Patient agrees to discharge. Patient goals were met. Patient is being discharged due to meeting the stated rehab goals.  Allyson Sabal [place], [place] 11/21/20 10:45 AM

## 2020-11-22 ENCOUNTER — Telehealth: Payer: Self-pay

## 2020-11-22 NOTE — Telephone Encounter (Signed)
Patient left a VM yesterday afternoon stating his mucositis had resolved, but occasionally he was coughing up yellow sputum with flecks of blood. He wanted to know if this was a worrisome symptom, and whether or not Dr. Isidore Moos would recommend he take a course of antibiotics.   Relayed patient's concerns to Dr. Isidore Moos and received direction to tell patient "I would just monitor this - his throat is still going to be pretty "raw" from tx and sputum/flecks of blood are to be expected."  Returned patient's call this morning and passed along Dr. Pearlie Oyster recommendation. Encouraged patient to continue to monitor symptoms, and to call me or [facility] should anything worsen or new symptoms arise. Patient verbalized understanding and agreement, and appreciation of call. No other needs identified at this time

## 2020-11-23 ENCOUNTER — Ambulatory Visit: Payer: BC Managed Care – PPO | Admitting: Radiation Oncology

## 2020-11-30 ENCOUNTER — Ambulatory Visit: Payer: PRIVATE HEALTH INSURANCE

## 2020-11-30 ENCOUNTER — Other Ambulatory Visit: Payer: Self-pay

## 2020-11-30 DIAGNOSIS — R1313 Dysphagia, pharyngeal phase: Secondary | ICD-10-CM

## 2020-11-30 DIAGNOSIS — R293 Abnormal posture: Secondary | ICD-10-CM | POA: Diagnosis not present

## 2020-11-30 NOTE — Therapy (Signed)
[facility] [address] [place], [place], [address] Phone: [phone]   Fax:  [phone]  Speech Language Pathology Treatment  Patient Details  Name: Alexander [name] MRN: [id number] Date of Birth: 08-18-1960 Referring Provider (SLP): Eppie Gibson   Encounter Date: 11/30/2020   End of Session - 11/30/20 1552     Visit Number 3    Number of Visits 4    Date for SLP Re-Evaluation 12/19/20    SLP Start Time 1450    SLP Stop Time  1520    SLP Time Calculation (min) 30 min    Activity Tolerance Patient tolerated treatment well             Past Medical History:  Diagnosis Date   Asthma    Cancer ([place])    GERD (gastroesophageal reflux disease)    Sleep apnea    CPAP- does not wear    Past Surgical History:  Procedure Laterality Date   BRONCHIAL NEEDLE ASPIRATION BIOPSY  05/22/2020   Procedure: BRONCHIAL NEEDLE ASPIRATION BIOPSIES;  Surgeon: Garner Nash, DO;  Location: [facility];  Service: Pulmonary;;   DIRECT LARYNGOSCOPY  05/09/2020   Procedure: DIRECT LARYNGOSCOPY;  Surgeon: Rozetta Nunnery, MD;  Location: [place];  Service: ENT;;   IR GASTROSTOMY TUBE MOD SED  09/13/2020   IR IMAGING GUIDED PORT INSERTION  06/12/2020   MASS EXCISION N/A 05/09/2020   Procedure: EXCISION TONGUE MASS;  Surgeon: Rozetta Nunnery, MD;  Location: [facility];  Service: ENT;  Laterality: N/A;   TONSILLECTOMY     TRACHEOSTOMY TUBE PLACEMENT  05/09/2020   Procedure: TRACHEOSTOMY;  Surgeon: Rozetta Nunnery, MD;  Location: [facility];  Service: ENT;;   VIDEO BRONCHOSCOPY WITH ENDOBRONCHIAL ULTRASOUND N/A 05/22/2020   Procedure: VIDEO BRONCHOSCOPY WITH ENDOBRONCHIAL ULTRASOUND;  Surgeon: Garner Nash, DO;  Location: [facility];  Service: Pulmonary;  Laterality: N/A;    There were no vitals filed for this visit.   Subjective Assessment - 11/30/20 1459     Subjective "Since last Thursday I've been eating lunch and  dinner."    Currently in Pain? No/denies                   ADULT SLP TREATMENT - 11/30/20 1500       General Information   Behavior/Cognition Alert;Cooperative;Pleasant mood      Treatment Provided   Treatment provided Dysphagia      Dysphagia Treatment   Temperature Spikes Noted No    Oral Cavity - Dentition Adequate natural dentition    Treatment Methods Skilled observation;Therapeutic exercise;Patient/caregiver education    Patient observed directly with PO's Yes    Type of PO's observed Dysphagia 3 (soft);Thin liquids    Liquids provided via Cup   cup   Oral Phase Signs & Symptoms Other (comment)   none noted   Pharyngeal Phase Signs & Symptoms Other (comment)   none noted   Other treatment/comments "I had pizza last night. Two slices." Pt has not used PEG since Saturday. Reports taste slowly returning. Has not completed HEP as directed - maybe does tongue press rarely. SLP reiterated a specific rationale for HEP and strongly reiterated BID completion after pt told SLP a general rationale for doing HEP ("keeps me swallowing normally"). Pt was indpendent with HEP after SLP mod cues consistently.      Assessment / Recommendations / Plan   Plan Continue with current plan of care      Progression Toward  Goals   Progression toward goals Not progressing toward goals (comment)   (not HEP goals due to choosing not to do HEP)             SLP Education - 11/30/20 1552     Education Details BID HEP, rationale for HEP, procedure for HEP    Person(s) Educated Patient    Methods Explanation;Demonstration;Verbal cues;Handout    Comprehension Verbalized understanding;Returned demonstration;Verbal cues required;Need further instruction             SLP Short Term Goals -  11/30/2020 1545 SLP SHORT TERM GOAL #1      Title pt will complete HEP with rare min A    Time     Period --   vists, for all STGs    Status Not met            SLP SHORT TERM GOAL #2    Title pt  will tell SLP why pt is completing HEP with modified independence    Time 1    Status Achieved           SLP SHORT TERM GOAL #3    Title pt will describe 3 overt s/s aspiration PNA with modified independence    Time 1    Status Not met - added to LTGs           SLP SHORT TERM GOAL #4    Title pt will tell SLP how a food journal could hasten return to a more normalized diet    Time 1    Status Deferred                     SLP Long Term Goals -  11/30/2020 1547                    SLP LONG TERM GOAL #1    Title pt will complete HEP with modified independence over two visits    Time 2    Period --   visits, for all LTGs    Status Ongoing           SLP LONG TERM GOAL #2    Title pt will describe how to modify HEP over time, and the timeline associated with reduction in HEP frequency with modified independence over two sessions    Time 3    Status  Ongoing    SLP LONG TERM GOAL #3  Title pt will describe 3 overt s/sx aspiration PNA  Time 2  Period   Status New                   Plan - 11/30/2020 1549       Clinical Impression Statement At this time pt swallowing is deemed WNL/WFL with dys III/thin liquids. There are no overt s/s aspiration reported by pt at this time with his POs at home; no overt s/sx aspiration PNA reported by pt or observed by SLP today. SLP reviewed an individualized HEP for dysphagia and pt completed each exercise on their own with consistent mod cues faded to modified independent. Data indicate that pt's swallow ability will likely decrease over the course of radiation/chemotherapy and could very well decline over time following conclusion of their radiation therapy due to muscle disuse atrophy and/or muscle fibrosis. Pt will cont to need to be seen by SLP in order to assess safety of PO intake, assess the need for recommending any objective swallow assessment, and ensuring pt  correctly completes the individualized HEP.    Speech Therapy Frequency --    once approx every four weeks    Duration --   90 days for this reporting period; overall, approx 7 visits    Treatment/Interventions Aspiration precaution training;Pharyngeal strengthening exercises;Diet toleration management by SLP;Trials of upgraded texture/liquids;Patient/family education;SLP instruction and feedback;Compensatory techniques    Potential to Achieve Goals Good    SLP Home Exercise Plan provided    Consulted and Agree with Plan of Care  pt              Patient will benefit from skilled therapeutic intervention in order to improve the following deficits and impairments:   Dysphagia, pharyngeal phase    Problem List Patient Active Problem List   Diagnosis Date Noted   Leukopenia due to antineoplastic chemotherapy ([place]) 11/08/2020   Port-A-Cath in place 10/03/2020   Intractable hiccups 10/03/2020   Thrombocytopenia ([place]) 10/03/2020   Chemotherapy-induced fatigue 09/27/2020   Weight loss, unintentional 06/01/2020   Sepsis due to undetermined organism ([place]) 05/28/2020   Malnutrition of moderate degree 05/28/2020   GERD (gastroesophageal reflux disease)    Volume depletion    Hyperbilirubinemia    Hyponatremia    Mild protein-calorie malnutrition ([facility])    Squamous cell cancer of tongue ([facility])    Pressure ulcer caused by device    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[place]) 05/17/2020   Mediastinal adenopathy 05/17/2020   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 [place], [place], [address] Phone: [phone]   Fax:  [phone]   Name: Alexander [name] MRN: [id number] Date of Birth: 1960/11/24

## 2020-12-03 ENCOUNTER — Encounter: Payer: Self-pay | Admitting: Hematology and Oncology

## 2020-12-05 ENCOUNTER — Encounter: Payer: Self-pay | Admitting: Hematology and Oncology

## 2020-12-06 NOTE — Progress Notes (Signed)
Mr. Sainvil presents today for follow-up after completing radiation to his base of tongue and subcarinal node on 11/07/2020  Pain issues, if any: none Using a feeding tube?: none Weight changes, if any:  Filed Weights   12/07/20 1052  Weight: 261 lb 6.4 oz (118.6 kg)  BP 96/76   Pulse 85   Temp 97.9 F (36.6 C)   Resp 20   Wt 261 lb 6.4 oz (118.6 kg)   SpO2 100%   BMI 32.67 kg/m   Swallowing issues, if any: none Last saw [name] on 11/30/2020 Smoking or chewing tobacco? none Using fluoride trays daily? none Last ENT visit was on: Not since diagnosis Other notable issues, if any: Had F/U with medical oncologist this morning Patient in for follow up. Is going to have feeding tube removed. Is eating well for about 3 weeks now.

## 2020-12-07 ENCOUNTER — Other Ambulatory Visit: Payer: Self-pay

## 2020-12-07 ENCOUNTER — Telehealth: Payer: Self-pay | Admitting: Dietician

## 2020-12-07 ENCOUNTER — Inpatient Hospital Stay: Payer: BC Managed Care – PPO | Admitting: Dietician

## 2020-12-07 ENCOUNTER — Encounter: Payer: Self-pay | Admitting: Hematology and Oncology

## 2020-12-07 ENCOUNTER — Ambulatory Visit
Admission: RE | Admit: 2020-12-07 | Discharge: 2020-12-07 | Disposition: A | Discharge status: Home / Self Care | Payer: PRIVATE HEALTH INSURANCE | Source: Ambulatory Visit | Attending: Radiation Oncology | Admitting: Radiation Oncology

## 2020-12-07 ENCOUNTER — Inpatient Hospital Stay: Payer: BC Managed Care – PPO | Attending: Hematology and Oncology | Admitting: Hematology and Oncology

## 2020-12-07 ENCOUNTER — Encounter: Payer: Self-pay | Admitting: Radiation Oncology

## 2020-12-07 VITALS — BP 120/71 | HR 99 | Temp 97.5°F | Resp 18 | Ht 75.0 in | Wt 261.0 lb

## 2020-12-07 VITALS — BP 96/76 | HR 85 | Temp 97.9°F | Resp 20 | Wt 261.4 lb

## 2020-12-07 DIAGNOSIS — Z79899 Other long term (current) drug therapy: Secondary | ICD-10-CM | POA: Insufficient documentation

## 2020-12-07 DIAGNOSIS — C109 Malignant neoplasm of oropharynx, unspecified: Secondary | ICD-10-CM

## 2020-12-07 DIAGNOSIS — Z923 Personal history of irradiation: Secondary | ICD-10-CM | POA: Insufficient documentation

## 2020-12-07 DIAGNOSIS — R634 Abnormal weight loss: Secondary | ICD-10-CM | POA: Insufficient documentation

## 2020-12-07 DIAGNOSIS — C01 Malignant neoplasm of base of tongue: Secondary | ICD-10-CM | POA: Diagnosis present

## 2020-12-07 DIAGNOSIS — C781 Secondary malignant neoplasm of mediastinum: Secondary | ICD-10-CM | POA: Diagnosis not present

## 2020-12-07 DIAGNOSIS — R682 Dry mouth, unspecified: Secondary | ICD-10-CM | POA: Insufficient documentation

## 2020-12-07 DIAGNOSIS — D61818 Other pancytopenia: Secondary | ICD-10-CM | POA: Diagnosis not present

## 2020-12-07 NOTE — Progress Notes (Signed)
[place] FOLLOW-UP progress notes  Patient Care Team: Pcp, No as PCP - General [place], Stephani Police, RN as Oncology Nurse Navigator Benay Pike, MD as Consulting Physician (Hematology and Oncology) Rozetta Nunnery, MD as Consulting Physician (Otolaryngology) Eppie Gibson, MD as Consulting Physician (Radiation Oncology)  CHIEF COMPLAINTS/PURPOSE OF VISIT:  Base of tongue cancer, for further evaluation  HISTORY OF PRESENTING ILLNESS:  Alexander [name] 60 y.o. 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 He received induction chemotherapy followed by concurrent chemoradiation therapy over the past 6 months His last dose of chemotherapy was approximately a month ago He is doing very well He denies mucositis or oral pain The neck mass on the left side of his neck is improving and getting smaller He denies dysphagia He stopped using his feeding tube 2 weeks ago and is able to maintain his weight and nutrition He denies new lymphadenopathy He has mild persistent dry mouth No peripheral neuropathy from prior chemo  I reviewed the patient's records extensive and collaborated the history with the patient. Summary of his history is as follows: Oncology History  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[place])  05/17/2020 Initial Diagnosis   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[place])   05/18/2020 Cancer Staging   Staging form: Pharynx - HPV-Mediated Oropharynx, AJCC 8th Edition - Clinical stage from 05/18/2020: cT4, cN3, p16+ - Signed by Eppie Gibson, MD on 05/22/2020    06/01/2020 Cancer Staging   Staging form: Pharynx - HPV-Mediated Oropharynx, AJCC 8th Edition - Pathologic stage from 06/01/2020: No Stage Recommended (cT4, cN3, cM1, p16+) - Signed by Benay Pike, MD on 06/01/2020 Stage prefix: Initial diagnosis    06/12/2020 - 08/21/2020 Chemotherapy          09/10/2020 - 09/10/2020 Chemotherapy          09/19/2020 -  Chemotherapy    Patient is on Treatment Plan: HEAD/NECK CISPLATIN Q7D         MEDICAL HISTORY:  Past Medical History:  Diagnosis Date   Asthma    Cancer ([place])    GERD (gastroesophageal reflux disease)    Sleep apnea    CPAP- does not wear    SURGICAL HISTORY: Past Surgical History:  Procedure Laterality Date   BRONCHIAL NEEDLE ASPIRATION BIOPSY  05/22/2020   Procedure: BRONCHIAL NEEDLE ASPIRATION BIOPSIES;  Surgeon: Garner Nash, DO;  Location: [facility];  Service: Pulmonary;;   DIRECT LARYNGOSCOPY  05/09/2020   Procedure: DIRECT LARYNGOSCOPY;  Surgeon: Rozetta Nunnery, MD;  Location: [facility];  Service: ENT;;   IR GASTROSTOMY TUBE MOD SED  09/13/2020   IR IMAGING GUIDED PORT INSERTION  06/12/2020   MASS EXCISION N/A 05/09/2020   Procedure: EXCISION TONGUE MASS;  Surgeon: Rozetta Nunnery, MD;  Location: [facility];  Service: ENT;  Laterality: N/A;   TONSILLECTOMY     TRACHEOSTOMY TUBE PLACEMENT  05/09/2020   Procedure: TRACHEOSTOMY;  Surgeon: Rozetta Nunnery, MD;  Location: [facility];  Service: ENT;;   VIDEO BRONCHOSCOPY WITH ENDOBRONCHIAL ULTRASOUND N/A 05/22/2020   Procedure: VIDEO BRONCHOSCOPY WITH ENDOBRONCHIAL ULTRASOUND;  Surgeon: Garner Nash, DO;  Location: [facility];  Service: Pulmonary;  Laterality: N/A;    SOCIAL HISTORY: Social History   Socioeconomic History   Marital status: Single    Spouse name:  Not on file   Number of children: Not on file   Years of education: Not on file   Highest education level: Not on file  Occupational History   Not on file  Tobacco Use   Smoking status: Never   Smokeless tobacco: Never  Vaping Use   Vaping Use: Never used  Substance and Sexual Activity   Alcohol use: Not Currently    Comment: "4-5 drinks a year for special ocassions"  05/08/20   Drug use: Never   Sexual activity: Not Currently  Other Topics Concern   Not on file  Social History Narrative   Not on file   Social Determinants of Health   Financial Resource Strain: Low Risk    Difficulty of Paying Living Expenses: Not hard at all  Food Insecurity: No Food Insecurity   Worried About Charity fundraiser in the Last Year: Never true   [place] in the Last Year: Never true  Transportation Needs: No Transportation Needs   Lack of Transportation (Medical): No   Lack of Transportation (Non-Medical): No  Physical Activity: Not on file  Stress: No Stress Concern Present   Feeling of Stress : Not at all  Social Connections: Socially Isolated   Frequency of Communication with Friends and Family: More than three times a week   Frequency of Social Gatherings with Friends and Family: More than three times a week   Attends Religious Services: Never   Marine scientist or Organizations: No   Attends Archivist Meetings: Never   Marital Status: Separated  Intimate Partner Violence: Not on file    FAMILY HISTORY: Family History  Problem Relation Age of Onset   Breast cancer Mother    Cancer - Cervical Mother    Colon cancer Father    Colon cancer Brother     ALLERGIES:  has No Known Allergies.  MEDICATIONS:  Current Outpatient Medications  Medication Sig Dispense Refill   albuterol (PROVENTIL) (2.5 MG/3ML) 0.083% nebulizer solution Take 3 mLs (2.5 mg total) by nebulization every 6 (six) hours as needed for wheezing or shortness of breath. 120 mL 12   albuterol (VENTOLIN HFA) 108 (90 Base) MCG/ACT inhaler Inhale 2 puffs into the lungs as needed for wheezing or shortness of breath.     cetirizine (ZYRTEC) 10 MG tablet Take 10 mg by mouth daily.     fluticasone (FLONASE) 50 MCG/ACT nasal spray Place 2 sprays into both nostrils daily. Place 1 spray each nostril at night 16 g 11   Fluticasone-Salmeterol (ADVAIR) 100-50 MCG/DOSE AEPB  Inhale 1 puff into the lungs daily.     omeprazole (PRILOSEC) 40 MG capsule Take 40 mg by mouth daily.     No current facility-administered medications for this visit.   Facility-Administered Medications Ordered in Other Visits  Medication Dose Route Frequency Provider Last Rate Last Admin   magnesium sulfate 2 GM/50ML IVPB             REVIEW OF SYSTEMS:   Constitutional: Denies fevers, chills or abnormal night sweats Eyes: Denies blurriness of vision, double vision or watery eyes Ears, nose, mouth, throat, and face: Denies mucositis or sore throat Respiratory: Denies cough, dyspnea or wheezes Cardiovascular: Denies palpitation, chest discomfort or lower extremity swelling Gastrointestinal:  Denies nausea, heartburn or change in bowel habits Skin: Denies abnormal skin rashes Lymphatics: Denies new lymphadenopathy or easy bruising Neurological:Denies numbness, tingling or new weaknesses Behavioral/Psych: Mood is stable, no new changes  All other  systems were reviewed with the patient and are negative.  PHYSICAL EXAMINATION: ECOG PERFORMANCE STATUS: 1 - Symptomatic but completely ambulatory  Vitals:   12/07/20 1013  BP: 120/71  Pulse: 99  Resp: 18  Temp: (!) 97.5 F (36.4 C)  SpO2: 100%   Filed Weights   12/07/20 1013  Weight: 261 lb (118.4 kg)    GENERAL:alert, no distress and comfortable SKIN: skin color, texture, turgor are normal, no rashes or significant lesions EYES: normal, conjunctiva are pink and non-injected, sclera clear OROPHARYNX:no exudate, normal lips, buccal mucosa, and tongue.  Poor dentition is noted NECK: supple, thyroid normal size, non-tender, without nodularity LYMPH: He has palpable lymphadenopathy in the left cervical region LUNGS: clear to auscultation and percussion with normal breathing effort HEART: regular rate & rhythm and no murmurs without lower extremity edema ABDOMEN:abdomen soft, non-tender and normal bowel sounds Musculoskeletal:no  cyanosis of digits and no clubbing  PSYCH: alert & oriented x 3 with fluent speech NEURO: no focal motor/sensory deficits  LABORATORY DATA:  I have reviewed the data as listed Lab Results  Component Value Date   WBC 2.8 (L) 11/08/2020   HGB 11.1 (L) 11/08/2020   HCT 32.2 (L) 11/08/2020   MCV 102.5 (H) 11/08/2020   PLT 236 11/08/2020   Recent Labs    04/30/20 0953 05/10/20 0210 07/26/20 1325 08/17/20 0845 09/19/20 0826 09/27/20 0809 10/25/20 0852 10/31/20 0826 11/08/20 0750  NA 137   < > 140 138 141   < > 141 140 136  K 4.4   < > 4.0 4.0 4.0   < > 4.3 4.1 4.1  CL 103   < > 104 104 106   < > 105 103 101  CO2 25   < > 29 28 26    < > 27 29 25   GLUCOSE 103*   < > 111* 96 101*   < > 103* 128* 125*  BUN 13   < > 10 19 12    < > 14 21* 19  CREATININE 1.18   < > 1.07 1.17 1.35*   < > 1.09 0.97 1.23  CALCIUM 9.5   < > 9.7 9.4 9.5   < > 9.0 9.2 9.6  GFRNONAA >60   < > >60 >60 >60   < > >60 >60 >60  PROT 7.4   < > 6.9 7.3 6.9  --   --   --   --   ALBUMIN 3.8   < > 3.5 3.9 3.6  --   --   --   --   AST 23   < > 13* 19 17  --   --   --   --   ALT 24   < > 12 18 15   --   --   --   --   ALKPHOS 52   < > 58 58 53  --   --   --   --   BILITOT 1.1   < > 0.7 0.7 0.6  --   --   --   --   BILIDIR 0.2  --   --   --   --   --   --   --   --   IBILI 0.9  --   --   --   --   --   --   --   --    < > = values in this interval not displayed.    ASSESSMENT & PLAN:  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) Overall, he is recovering well from recent treatment He still have palpable lymphadenopathy on the left cervical region but according to the patient, it is much smaller We will continue aggressive supportive care He would benefit from repeat imaging study at the end of the year I plan to see him next month for further follow-up with repeat blood work, history and physical examination  Pancytopenia, acquired ([place]) I plan to repeat his blood work again next month for further  follow-up  Weight loss, unintentional He has stopped using his feeding tube approximately 2 weeks ago and is able to maintain oral intake without difficulties He has appointment to see speech and language therapist in the near future for repeat assessment but overall, he is doing very well I will get his feeding tube removed in the meantime  Dry mouth He has persistent dry mouth since discontinuation of radiation therapy We discussed importance of oral hygiene and frequent oral liquids  Orders Placed This Encounter  Procedures   IR GASTROSTOMY TUBE REMOVAL    Standing Status:   Future    Standing Expiration Date:   12/07/2021    Order Specific Question:   Reason for Exam (SYMPTOM  OR DIAGNOSIS REQUIRED)    Answer:   no need feeding tube anymore    Order Specific Question:   Preferred Imaging Location?    Answer:   [facility]   Comprehensive metabolic panel    Standing Status:   Future    Standing Expiration Date:   12/07/2021   CBC with Differential/Platelet    Standing Status:   Future    Standing Expiration Date:   12/07/2021   TSH    Standing Status:   Future    Standing Expiration Date:   12/07/2021    All questions were answered. The patient knows to call the clinic with any problems, questions or concerns. The total time spent in the appointment was 30 minutes encounter with patients including review of chart and various tests results, discussions about plan of care and coordination of care plan   Heath Lark, MD 12/07/2020 2:30 PM

## 2020-12-07 NOTE — Assessment & Plan Note (Signed)
He has stopped using his feeding tube approximately 2 weeks ago and is able to maintain oral intake without difficulties He has appointment to see speech and language therapist in the near future for repeat assessment but overall, he is doing very well I will get his feeding tube removed in the meantime

## 2020-12-07 NOTE — Assessment & Plan Note (Signed)
He has persistent dry mouth since discontinuation of radiation therapy We discussed importance of oral hygiene and frequent oral liquids

## 2020-12-07 NOTE — Assessment & Plan Note (Signed)
I plan to repeat his blood work again next month for further follow-up

## 2020-12-07 NOTE — Progress Notes (Signed)
Oncology Nurse Navigator Documentation   I met with Alexander [name] during his follow up appointment with Dr. Isidore Moos today. He is improving after his recent radiation/chemotherapy treatment. He saw Dr. Alvy Bimler today and she has placed an order to have his PEG removed. I have requested a follow up appointment be scheduled with Dr. Benson Norway for post-treatment follow up. He has also been scheduled with Dr. Isidore Moos on 02/20/20 to receive results of a PET scan completed the day before.   [name] RN, BSN, OCN Head & Neck Oncology Nurse [name] [facility] Phone # [phone]  Fax # [phone]

## 2020-12-07 NOTE — Assessment & Plan Note (Signed)
Overall, he is recovering well from recent treatment He still have palpable lymphadenopathy on the left cervical region but according to the patient, it is much smaller We will continue aggressive supportive care He would benefit from repeat imaging study at the end of the year I plan to see him next month for further follow-up with repeat blood work, history and physical examination

## 2020-12-07 NOTE — Telephone Encounter (Signed)
Nutrition Follow-up:  Received telephone call from patient this afternoon. Patient reports he was on "cloud nine" after MD placed orders for his tube to be removed that he forgot about nutrition follow-up this morning. Patient reports he is doing "great" and has not used tube for feeding in the last 3 weeks. He reports taste is slowly improving, states he gets a taste here and there. Last night he was able to taste his roast beef sandwich and will be eating another sandwich for lunch today. Patient has been eating a variety of foods, recalls scrambled eggs with cheese, salami, grits, fried fish, shrimp, okra. He is constantly drinking water as well as some green tea. He tried drinking a soda from [employer] recently, but it had too much carbonation. Patient reports ongoing dry mouth, he has not used baking soda, salt water rinses recently.    Medications: reviewed  Labs: reviewed  Anthropometrics: Weight 261 lb 6.4 oz today decreased from 263 lb 4.8 oz on 9/23 and 268 lb 3.2 oz on 9/14.    NUTRITION DIAGNOSIS: Inadequate oral intake improved   INTERVENTION:  Congratulated patient on pending feeding tube removal Reinforced importance of continued increased calorie and protein needs to promote healing Encouraged high protein foods Reviewed strategies for dry mouth Patient has contact information     MONITORING, EVALUATION, GOAL: weight trends, intake   NEXT VISIT: November 25 in clinic after MD visit

## 2020-12-10 ENCOUNTER — Encounter: Payer: Self-pay | Admitting: Radiation Oncology

## 2020-12-10 NOTE — Progress Notes (Signed)
Radiation Oncology         [phone] ________________________________  Name: Alexander [name] MRN: [id number]  Date: 12/07/2020  DOB: 1960/10/19  Follow-Up Visit Note  Outpatient  CC: Pcp, No  Melony Overly E, *  Diagnosis and Prior Radiotherapy:    ICD-10-CM   1. 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)  C01       CHIEF COMPLAINT: Here for follow-up and surveillance of throat cancer  Narrative:  The patient returns today for routine follow-up.  [name] presents today for follow-up after completing radiation to his base of tongue , neck, and subcarinal node on 11/07/2020  He states he feels great.  He met with medical oncology today and is going to get his PEG tube removed soon.  Pain issues, if any: none Using a feeding tube?: none Weight changes, if any:  Filed Weights   12/07/20 1052  Weight: 261 lb 6.4 oz (118.6 kg)  BP 96/76   Pulse 85   Temp 97.9 F (36.6 C)   Resp 20   Wt 261 lb 6.4 oz (118.6 kg)   SpO2 100%   BMI 32.67 kg/m   Swallowing issues, if any: none Last saw Alexander [name] on 11/30/2020 Smoking or chewing tobacco? none Using fluoride trays daily? none Last ENT visit was on: Not since diagnosis Other notable issues, if any: Had F/U with medical oncologist this morning Patient in for follow up. Is going to have feeding tube removed. Is eating well for about 3 weeks now.                                ALLERGIES:  has No Known Allergies.  Meds: Current Outpatient Medications  Medication Sig Dispense Refill   albuterol (PROVENTIL) (2.5 MG/3ML) 0.083% nebulizer solution Take 3 mLs (2.5 mg total) by nebulization every 6 (six) hours as needed for wheezing or shortness of breath. 120 mL 12   albuterol (VENTOLIN HFA) 108 (90 Base) MCG/ACT inhaler Inhale 2 puffs into the lungs as needed for wheezing or shortness of breath.     cetirizine (ZYRTEC) 10 MG tablet Take 10 mg by mouth daily.     fluticasone (FLONASE) 50 MCG/ACT nasal  spray Place 2 sprays into both nostrils daily. Place 1 spray each nostril at night 16 g 11   Fluticasone-Salmeterol (ADVAIR) 100-50 MCG/DOSE AEPB Inhale 1 puff into the lungs daily.     omeprazole (PRILOSEC) 40 MG capsule Take 40 mg by mouth daily.     No current facility-administered medications for this encounter.   Facility-Administered Medications Ordered in Other Encounters  Medication Dose Route Frequency Provider Last Rate Last Admin   magnesium sulfate 2 GM/50ML IVPB             Physical Findings: The patient is in no acute distress. Patient is alert and oriented.  weight is 261 lb 6.4 oz (118.6 kg). His temperature is 97.9 F (36.6 C). His blood pressure is 96/76 and his pulse is 85. His respiration is 20 and oxygen saturation is 100%. [name]    HEENT: No evidence of tumor in the mouth or upper throat.  Mucositis is resolving and there is no oral thrush. Skin: Has healed beautifully over his neck. NECK: No palpable masses in the cervical or supraclavicular neck  Lab Findings: Lab Results  Component Value Date   WBC 2.8 (L) 11/08/2020   HGB 11.1 (L) 11/08/2020   HCT  32.2 (L) 11/08/2020   MCV 102.5 (H) 11/08/2020   PLT 236 11/08/2020    Radiographic Findings: No results found.  Impression/Plan: He is healing well from chemoradiotherapy.  He has had a good clinical response per physical exam.  I will see him back in early January to review his restaging PET scan results.  He is pleased with this plan will call us if he has any needs in the interim.  On date of service, in total, I spent 20 minutes on this encounter. Patient was seen in person.  _____________________________________   Alexander Gibson, MD

## 2020-12-14 ENCOUNTER — Ambulatory Visit (HOSPITAL_COMMUNITY)
Admission: RE | Admit: 2020-12-14 | Discharge: 2020-12-14 | Disposition: A | Discharge status: Home / Self Care | Payer: Worker's Compensation | Source: Ambulatory Visit | Attending: Hematology and Oncology | Admitting: Hematology and Oncology

## 2020-12-14 ENCOUNTER — Other Ambulatory Visit: Payer: Self-pay

## 2020-12-14 DIAGNOSIS — Z431 Encounter for attention to gastrostomy: Secondary | ICD-10-CM | POA: Insufficient documentation

## 2020-12-14 DIAGNOSIS — Z85810 Personal history of malignant neoplasm of tongue: Secondary | ICD-10-CM | POA: Insufficient documentation

## 2020-12-14 DIAGNOSIS — C109 Malignant neoplasm of oropharynx, unspecified: Secondary | ICD-10-CM

## 2020-12-14 HISTORY — PX: IR GASTROSTOMY TUBE REMOVAL: IMG5492

## 2020-12-14 MED ORDER — LIDOCAINE VISCOUS HCL 2 % MT SOLN
OROMUCOSAL | Status: DC | PRN
  Administered 2020-12-14: 15 mL via OROMUCOSAL

## 2020-12-14 MED ORDER — LIDOCAINE VISCOUS HCL 2 % MT SOLN
OROMUCOSAL | Status: AC
  Filled 2020-12-14: qty 15

## 2020-12-14 NOTE — Procedures (Signed)
Per order of Dr. Alvy Bimler ,patient's 78 French pull-through gastrostomy tube was removed in its entirety without immediate complications.  Gauze dressing applied to site.  Medication used- 1% lidocaine into gastrostomy tube insertion site tract.  Site care instructions were reviewed with patient. EBL< 3 cc.

## 2020-12-19 ENCOUNTER — Encounter: Payer: Self-pay | Admitting: Hematology and Oncology

## 2020-12-19 NOTE — Progress Notes (Signed)
                                                                                                                                                             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  Cancer Staging: Cancer Staging 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) Staging form: Pharynx - HPV-Mediated Oropharynx, AJCC 8th Edition - Clinical stage from 05/18/2020: Stage IV (cT4, cN3, cM1, p16+) - Signed by Eppie Gibson, MD on 12/10/2020 - Pathologic stage from 06/01/2020: No Stage Recommended (cT4, cN3, cM1, p16+) - Signed by Benay Pike, MD on 06/01/2020 Stage prefix: Initial diagnosis  Intent: Curative  Radiation Treatment Dates: 09/19/2020 through 11/07/2020 Site Technique Total Dose (Gy) Dose per Fx (Gy) Completed Fx Beam Energies  Neck: HN_BOT IMRT 70/70 2 35/35 6X  Mediastinum: Chest_subcari IMRT 66/66 2 33/33 6X   Narrative: The patient tolerated radiation therapy relatively well.   Plan: The patient will follow-up with radiation oncology in 2-3wks. -----------------------------------  Eppie Gibson, MD

## 2021-01-04 ENCOUNTER — Encounter: Payer: Self-pay | Admitting: Hematology and Oncology

## 2021-01-04 ENCOUNTER — Ambulatory Visit (INDEPENDENT_AMBULATORY_CARE_PROVIDER_SITE_OTHER): Payer: BC Managed Care – PPO | Admitting: Dentistry

## 2021-01-04 ENCOUNTER — Other Ambulatory Visit: Payer: Self-pay

## 2021-01-04 DIAGNOSIS — K117 Disturbances of salivary secretion: Secondary | ICD-10-CM

## 2021-01-04 DIAGNOSIS — R634 Abnormal weight loss: Secondary | ICD-10-CM

## 2021-01-04 DIAGNOSIS — K036 Deposits [accretions] on teeth: Secondary | ICD-10-CM

## 2021-01-04 DIAGNOSIS — Z923 Personal history of irradiation: Secondary | ICD-10-CM

## 2021-01-04 DIAGNOSIS — R432 Parageusia: Secondary | ICD-10-CM

## 2021-01-04 DIAGNOSIS — Y842 Radiological procedure and radiotherapy as the cause of abnormal reaction of the patient, or of later complication, without mention of misadventure at the time of the procedure: Secondary | ICD-10-CM

## 2021-01-04 MED ORDER — SODIUM FLUORIDE 1.1 % DT GEL: DENTAL | 11 refills | Status: DC

## 2021-01-04 NOTE — Progress Notes (Signed)
Department of Dental Medicine                LIMITED EXAM   Service Date:   01/04/2021  Patient Name:   Alexander [name] Date of Birth:   Aug 17, 1960 Medical Record Number: [id number]   PLAN/RECOMMENDATIONS   Assessment Dysgeusia, xerostomia, weight loss, accretions  Recommendations Brush after meals and at bedtime.  Use fluoride at bedtime. Use trismus exercises as directed. Try using CLoSYS for dry mouth.  Use warm salt water rinses as needed. Take multiple sips of water as needed.  Plan Return  for routine dental care including cleanings/periodontal therapy, exams and other indicated treatment. Call if any questions or concerns arise. Rx:  Sodium fluoride 1.1% gel to use with fluoride trays   01/04/2021 Progress Note:  COVID-19 SCREENING:  The patient denies symptoms concerning for COVID-19 infection including fever, chills, cough, or newly developed shortness of breath.   HISTORY OF PRESENT ILLNESS: Alexander [name] is a 60 y.o. male who presents today for an oral examination after radiation therapy for base of tongue cancer and for delivery of upper and lower fluoride trays.  The patient has completed 35 of 35 radiation treatments from 09-19-20 to 11-07-20.  They have also completed chemotherapy.   REVIEW OF CHIEF COMPLAINTS: Dry mouth:  Yes, using Biotene as needed Hard to swallow: No  Hurts to swallow:  No  Taste changes:  Yes  Sores in mouth:  No  Limited opening:  No  Weight loss:  Yes   246 lbs down from 286 lbs   AT- HOME ORAL HYGIENE REGIMEN: Brushing:  Yes, at least twice a day Flossing:  No  Rinsing:  No  Fluoride:  No, but he is receiving fluoride trays today. Trismus exercises:  No Maximum Interincisal Opening:  45 mm   Patient Active Problem List   Diagnosis Date Noted   Pancytopenia, acquired ([place]) 12/07/2020   Dry mouth 12/07/2020   Leukopenia due to antineoplastic chemotherapy ([place]) 11/08/2020   Port-A-Cath in place 10/03/2020    Intractable hiccups 10/03/2020   Thrombocytopenia ([place]) 10/03/2020   Chemotherapy-induced fatigue 09/27/2020   Weight loss, unintentional 06/01/2020   Sepsis due to undetermined organism ([place]) 05/28/2020   Malnutrition of moderate degree 05/28/2020   GERD (gastroesophageal reflux disease)    Volume depletion    Hyperbilirubinemia    Hyponatremia    Mild protein-calorie malnutrition (HCC)    Pressure ulcer caused by device    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[place]) 05/17/2020   Mediastinal adenopathy 05/17/2020   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   Compromised airway 05/09/2020   Tracheostomy care ([place])    Acute hypoxemic respiratory failure ([place])    Past Medical History:  Diagnosis Date   Asthma    Cancer ([place])    GERD (gastroesophageal reflux disease)    Sleep apnea    CPAP- does not wear   Past Surgical History:  Procedure Laterality Date   BRONCHIAL NEEDLE ASPIRATION BIOPSY  05/22/2020   Procedure: BRONCHIAL NEEDLE ASPIRATION BIOPSIES;  Surgeon: Garner Nash, DO;  Location: [facility];  Service: Pulmonary;;   DIRECT LARYNGOSCOPY  05/09/2020   Procedure: DIRECT LARYNGOSCOPY;  Surgeon: Rozetta Nunnery, MD;  Location: [facility];  Service: ENT;;   IR GASTROSTOMY TUBE MOD SED  09/13/2020   IR GASTROSTOMY TUBE REMOVAL  12/14/2020   IR IMAGING GUIDED PORT INSERTION  06/12/2020   MASS EXCISION N/A 05/09/2020   Procedure: EXCISION TONGUE MASS;  Surgeon:  Rozetta Nunnery, MD;  Location: [facility];  Service: ENT;  Laterality: N/A;   TONSILLECTOMY     TRACHEOSTOMY TUBE PLACEMENT  05/09/2020   Procedure: TRACHEOSTOMY;  Surgeon: Rozetta Nunnery, MD;  Location: [facility];  Service: ENT;;   VIDEO BRONCHOSCOPY WITH ENDOBRONCHIAL ULTRASOUND N/A 05/22/2020   Procedure: VIDEO BRONCHOSCOPY WITH ENDOBRONCHIAL ULTRASOUND;  Surgeon: Garner Nash, DO;  Location: [facility];  Service: Pulmonary;  Laterality: N/A;   Current Outpatient Medications  Medication Sig Dispense  Refill   albuterol (PROVENTIL) (2.5 MG/3ML) 0.083% nebulizer solution Take 3 mLs (2.5 mg total) by nebulization every 6 (six) hours as needed for wheezing or shortness of breath. 120 mL 12   albuterol (VENTOLIN HFA) 108 (90 Base) MCG/ACT inhaler Inhale 2 puffs into the lungs as needed for wheezing or shortness of breath.     cetirizine (ZYRTEC) 10 MG tablet Take 10 mg by mouth daily.     fluticasone (FLONASE) 50 MCG/ACT nasal spray Place 2 sprays into both nostrils daily. Place 1 spray each nostril at night 16 g 11   Fluticasone-Salmeterol (ADVAIR) 100-50 MCG/DOSE AEPB Inhale 1 puff into the lungs daily.     omeprazole (PRILOSEC) 40 MG capsule Take 40 mg by mouth daily.     No current facility-administered medications for this visit.   Facility-Administered Medications Ordered in Other Visits  Medication Dose Route Frequency Provider Last Rate Last Admin   magnesium sulfate 2 GM/50ML IVPB            No Known Allergies   VITALS: BP (P) 126/73 (BP Location: Right Arm, Patient Position: Sitting, Cuff Size: Normal)   Pulse (!) (P) 102   Temp (P) 98.5 F (36.9 C) (Oral)   Wt (P) 246 lb (111.6 kg)   BMI (P) 30.75 kg/m    DENTAL EXAM: Oral hygiene (plaque):  Yes, slight accumulation Mucositis:  No Description of saliva:  Thick, viscous saliva Exposed bone:  No Other watched areas:  No   ASSESSMENT/DIAGNOSES: Patient with history of head and neck radiation therapy.  Xerostomia:  Dry mouth is his worst reported side effect.  He is using Biotene and taking sips of water as needed which are helping okay, but at night they do not help much.  Recommend that he try using CLoSYS over-the-counter brand to see if it works better for him.  Coupons were given to the patient today.  Dysgeusia:  He has also suffered significantly from loss of taste, but reports that it is slowly returning.  For a while he said everything tasted like cardboard.  Weight Loss:  He had his PEG tube removed several  weeks ago because it started to make him sick with the feedings.  He is doing well and is able to continue maintaining adequate nutrition. Accretions:  He is brushing at least twice a day and oftentimes more.  Encouraged him to continue this and to start flossing at night before bedtime.      PROCEDURES: Delivery of upper and lower fluoride trays. Appliances were tried in and adjusted as needed.  Polished. Postoperative instructions were provided in a written and verbal format concerning the use and care of appliances.   PLAN AND RECOMMENDATIONS: Brush after meals and at bedtime.  Use fluoride at bedtime. Use trismus exercises as directed. Use CLoSYs for dry mouth and salt water/baking soda rinses as needed. Take multiple sips of water as needed. Rx:  Sodium fluoride 1.1% gel to use with fluoride trays  Return to the  hospital dental clinic for routine dental care including cleanings, periodic exams and other treatment as needed. Call if any questions or concerns arise.  All questions and concerns were invited and addressed.  The patient tolerated today's visit well and departed in stable condition.     [place] Benson [place], D.M.D.

## 2021-01-04 NOTE — Patient Instructions (Signed)
[place] Benson [place], D.M.D. Phone: [phone] Fax: [phone]   It was a pleasure seeing you today!  Please refer to the information below regarding your dental visit with [place].  Call us if any questions or concerns come up after you leave.   Thank you for giving [place] the opportunity to provide care for you.  If there is anything we can do for you, please let us know.    RECOMMENDATIONS: Brush after meals and at bedtime.  Floss once a day, and use fluoride at bedtime. Use trismus exercises as directed below. Use CLOSYs for dry mouth, and salt water/baking soda rinses to help with any mouth sores. Take multiple sips of water as needed.  Stay hydrated. Return to your regular dentist for routine dental care including cleanings and periodic exams.  If you do not have a regular dentist, it is important to establish care at an outside dental office of your choice.  Call us if any problems or concerns arise.    TRISMUS: Trismus is a condition where the jaw does not allow the mouth to open as wide as it usually does.  This can happen almost suddenly, or in other cases the process is so slow, it is hard to notice it-until it is too far along.  When the jaw joints and/or muscles have been exposed to radiation treatments, the onset of trismus is very slow.  This is because the muscles are losing their stretching ability over a long period of time, as long as 2 YEARS after the end of radiation.  It is therefore important to exercise these muscles and joints.  Trismus exercises: Use the stack of tongue depressors measuring the same or a little less than your maximum opening that was recorded at your first dental visit. Place the stack in your mouth, supporting the other end with your hand. Allow 30 seconds for muscle stretching. Rest for a few seconds. Repeat 3-5 times. For all radiation patients, this exercise is recommended in the mornings and evenings  unless otherwise instructed. The exercises should be done for a period of 2 YEARS after the end of radiation. Maximum jaw opening should be checked routinely on recall dental visits by your general dentist. Report any changes, soreness, or difficulties encountered when doing the exercises to your dentist.    [place]   How do I use my fluoride trays? The best time to use your Fluoride is in the morning after breakfast, or right before bed time.  You must brush your teeth very well and floss before using the Fluoride in order to get the best use out of the Fluoride treatments. Place 1 drop of Fluoride in each tooth space in the tray.  You do not need to use more. Place the tray(s) over your teeth.  Make sure the trays are seated all the way.  Remember, they only fit one way on your teeth. Clench your teeth together a few times with light chewing motions to distribute the fluoride. Leave the trays in place for a full 5 minutes.  You may drool a bit, so keep a tissue handy. At the end of the 5 minutes, take the trays out.  SPIT OUT the remaining fluoride, but DO NOT RINSE. DO NOT eat or drink after treatments for at least 30 minutes.  This is why the best time for your treatments is before bedtime.  How do I care for my fluoride trays? Clean the inside of  your Fluoride trays using cold water and a toothbrush. In order to keep your trays from discoloring and free from odors, soak them overnight in denture cleaners or soap and water.  Do not use bleach or non-denture products. Store the trays in a safe dry place AWAY from any heat until your next treatment.  Finally, be sure to let your pharmacy know when you are close to needing a new refill for your fluoride prescription so they have it ready for you without interruption of fluoride use.    QUESTIONS? If anything happens to your Fluoride trays, or they don't fit as well after any dental work, please let us know as soon as  possible.  Call the [facility] to schedule an appointment at [phone].

## 2021-01-07 ENCOUNTER — Encounter: Payer: Self-pay | Admitting: Hematology and Oncology

## 2021-01-07 ENCOUNTER — Telehealth: Payer: Self-pay

## 2021-01-07 DIAGNOSIS — Y842 Radiological procedure and radiotherapy as the cause of abnormal reaction of the patient, or of later complication, without mention of misadventure at the time of the procedure: Secondary | ICD-10-CM | POA: Insufficient documentation

## 2021-01-07 DIAGNOSIS — K117 Disturbances of salivary secretion: Secondary | ICD-10-CM | POA: Insufficient documentation

## 2021-01-07 DIAGNOSIS — Z923 Personal history of irradiation: Secondary | ICD-10-CM | POA: Insufficient documentation

## 2021-01-07 DIAGNOSIS — K036 Deposits [accretions] on teeth: Secondary | ICD-10-CM | POA: Insufficient documentation

## 2021-01-07 DIAGNOSIS — R432 Parageusia: Secondary | ICD-10-CM | POA: Insufficient documentation

## 2021-01-07 NOTE — Telephone Encounter (Signed)
Patient calling in  Recently relocated to [place] has np appt scheduled w/ Dr. Mitchel Honour 03/25/21  Patient says he contacted his insurance [facility]) & they advised him that provider was not within network  Patient wants to confirm if this is correct.. wants a call back [phone]

## 2021-01-08 NOTE — Telephone Encounter (Signed)
Called pt back. Insurance is out of network. Provided the option of self pay. Pt said he would think about it; if he can find a provider in his insurance network he will call us back to cancel.

## 2021-01-11 ENCOUNTER — Inpatient Hospital Stay: Payer: PRIVATE HEALTH INSURANCE | Attending: Hematology and Oncology | Admitting: Hematology and Oncology

## 2021-01-11 ENCOUNTER — Encounter: Payer: Self-pay | Admitting: Hematology and Oncology

## 2021-01-11 ENCOUNTER — Other Ambulatory Visit: Payer: Self-pay

## 2021-01-11 ENCOUNTER — Inpatient Hospital Stay: Payer: PRIVATE HEALTH INSURANCE

## 2021-01-11 ENCOUNTER — Inpatient Hospital Stay: Payer: PRIVATE HEALTH INSURANCE | Admitting: Dietician

## 2021-01-11 VITALS — BP 102/69 | HR 89 | Temp 98.1°F | Resp 16 | Ht 75.0 in | Wt 249.1 lb

## 2021-01-11 DIAGNOSIS — R634 Abnormal weight loss: Secondary | ICD-10-CM

## 2021-01-11 DIAGNOSIS — R682 Dry mouth, unspecified: Secondary | ICD-10-CM

## 2021-01-11 DIAGNOSIS — C01 Malignant neoplasm of base of tongue: Secondary | ICD-10-CM | POA: Insufficient documentation

## 2021-01-11 DIAGNOSIS — I890 Lymphedema, not elsewhere classified: Secondary | ICD-10-CM | POA: Diagnosis not present

## 2021-01-11 DIAGNOSIS — D696 Thrombocytopenia, unspecified: Secondary | ICD-10-CM | POA: Diagnosis not present

## 2021-01-11 DIAGNOSIS — D61818 Other pancytopenia: Secondary | ICD-10-CM | POA: Diagnosis not present

## 2021-01-11 DIAGNOSIS — C109 Malignant neoplasm of oropharynx, unspecified: Secondary | ICD-10-CM

## 2021-01-11 DIAGNOSIS — Z95828 Presence of other vascular implants and grafts: Secondary | ICD-10-CM

## 2021-01-11 LAB — COMPREHENSIVE METABOLIC PANEL
ALT: 9 U/L (ref 0–44)
AST: 12 U/L — ABNORMAL LOW (ref 15–41)
Albumin: 4 g/dL (ref 3.5–5.0)
Alkaline Phosphatase: 46 U/L (ref 38–126)
Anion gap: 7 (ref 5–15)
BUN: 15 mg/dL (ref 6–20)
CO2: 26 mmol/L (ref 22–32)
Calcium: 9.4 mg/dL (ref 8.9–10.3)
Chloride: 108 mmol/L (ref 98–111)
Creatinine, Ser: 1.14 mg/dL (ref 0.61–1.24)
GFR, Estimated: >60 mL/min (ref >60)
Glucose, Bld: 102 mg/dL — ABNORMAL HIGH (ref 70–99)
Potassium: 3.9 mmol/L (ref 3.5–5.1)
Sodium: 141 mmol/L (ref 135–145)
Total Bilirubin: 1 mg/dL (ref 0.3–1.2)
Total Protein: 6.8 g/dL (ref 6.5–8.1)

## 2021-01-11 LAB — CBC WITH DIFFERENTIAL/PLATELET
Abs Immature Granulocytes: 0.01 10*3/uL (ref 0.00–0.07)
Basophils Absolute: 0 10*3/uL (ref 0.0–0.1)
Basophils Relative: 1 %
Eosinophils Absolute: 0.1 10*3/uL (ref 0.0–0.5)
Eosinophils Relative: 1 %
HCT: 35.9 % — ABNORMAL LOW (ref 39.0–52.0)
Hemoglobin: 12.2 g/dL — ABNORMAL LOW (ref 13.0–17.0)
Immature Granulocytes: 0 %
Lymphocytes Relative: 9 %
Lymphs Abs: 0.5 10*3/uL — ABNORMAL LOW (ref 0.7–4.0)
MCH: 34 pg (ref 26.0–34.0)
MCHC: 34 g/dL (ref 30.0–36.0)
MCV: 100 fL (ref 80.0–100.0)
Monocytes Absolute: 0.6 10*3/uL (ref 0.1–1.0)
Monocytes Relative: 10 %
Neutro Abs: 4.4 10*3/uL (ref 1.7–7.7)
Neutrophils Relative %: 79 %
Platelets: 149 10*3/uL — ABNORMAL LOW (ref 150–400)
RBC: 3.59 MIL/uL — ABNORMAL LOW (ref 4.22–5.81)
RDW: 12.2 % (ref 11.5–15.5)
WBC: 5.5 10*3/uL (ref 4.0–10.5)
nRBC: 0 % (ref 0.0–0.2)

## 2021-01-11 LAB — MAGNESIUM: Magnesium: 2.1 mg/dL (ref 1.7–2.4)

## 2021-01-11 LAB — TSH: TSH: 1.313 u[IU]/mL (ref 0.320–4.118)

## 2021-01-11 MED ORDER — SODIUM CHLORIDE 0.9% FLUSH
10.0000 mL | Freq: Once | INTRAVENOUS | Status: AC
  Administered 2021-01-11: 10 mL

## 2021-01-11 MED ORDER — HEPARIN SOD (PORK) LOCK FLUSH 100 UNIT/ML IV SOLN
500.0000 [IU] | Freq: Once | INTRAVENOUS | Status: AC
  Administered 2021-01-11: 500 [IU]

## 2021-01-11 NOTE — Assessment & Plan Note (Signed)
He has persistent dry mouth since discontinuation of radiation therapy We discussed importance of oral hygiene and frequent oral liquids

## 2021-01-11 NOTE — Progress Notes (Signed)
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[place]) Clinically, he has swelling around his neck secondary to acquired lymphedema but no signs of persistent disease He is scheduled for imaging study next month with follow-up to see radiation oncologist I will schedule port flush in 8 weeks and for him to return to see his primary oncologist with plan to get his port removed if PET/CT imaging is normal In the meantime, we will continue supportive care, dietary and lifestyle modification, frequent neck exercises as tolerated as well as physical therapy referral for acquired lymphedema  Dry mouth He has persistent dry mouth since discontinuation of radiation therapy We discussed importance of oral hygiene and frequent oral liquids  Pancytopenia, acquired ([place]) His leukopenia has resolved and anemia is improving The cause of mild thrombocytopenia is unknown, observe only we will plan to recheck in his next visit  Acquired lymphedema He has significant lymphedema in his neck from recent treatment We discussed the importance of neck exercises I think it is important for him to follow-up with physical therapy for this and he is in agreement for referral  Orders Placed This Encounter  Procedures   Ambulatory referral to Physical Therapy    Referral Priority:   Routine    Referral Type:   Physical Medicine    Referral Reason:   Specialty Services Required    Requested Specialty:   Physical Therapy    Number of Visits Requested:   1    All questions were answered. The patient knows to call the clinic with any problems, questions or  concerns. The total time spent in the appointment was 30 minutes encounter with patients including review of chart and various tests results, discussions about plan of care and coordination of care plan   Heath Lark, MD 01/11/2021 12:55 PM  INTERVAL HISTORY: Please see below for problem oriented charting. he returns for surveillance follow-up after completion of chemotherapy and radiation for squamous cell carcinoma of the tongue Since last time I saw him, he has lost some weight but he felt healthy His feeding tube has been removed Denies swallowing difficulties He has persistent dry mouth and saw oromaxillary facial surgeon recently for evaluation Denies recent choking No new tongue or neck masses He noticed significant neck swelling He has not been performing neck exercises He has noted some limited range of motion especially when he tried to lift his neck up   REVIEW OF SYSTEMS:   Constitutional: Denies fevers, chills or abnormal weight loss Eyes: Denies blurriness of vision Ears, nose, mouth, throat, and face: Denies mucositis or sore throat Respiratory: Denies cough, dyspnea or wheezes Cardiovascular: Denies palpitation, chest discomfort or lower extremity swelling Gastrointestinal:  Denies nausea, heartburn or change in bowel habits Skin: Denies abnormal skin rashes Lymphatics: Denies new lymphadenopathy or easy bruising Neurological:Denies numbness, tingling or new weaknesses Behavioral/Psych: Mood is stable, no new changes  All other systems were reviewed with the patient and are negative.  I have reviewed the past medical history, past surgical history, social history and family history with the patient and they are unchanged from previous note.  ALLERGIES:  has No Known Allergies.  MEDICATIONS:  Current Outpatient Medications  Medication Sig Dispense Refill   albuterol (PROVENTIL) (2.5 MG/3ML) 0.083% nebulizer solution Take 3 mLs (2.5 mg total) by nebulization every  6 (six) hours as needed for wheezing or shortness of breath. 120 mL 12   albuterol (VENTOLIN HFA) 108 (90 Base) MCG/ACT inhaler Inhale 2 puffs into the lungs as needed for wheezing or shortness of breath.     cetirizine (ZYRTEC) 10 MG tablet Take 10 mg by mouth daily.     fluticasone (FLONASE) 50 MCG/ACT nasal spray Place 2 sprays into both nostrils daily. Place 1 spray each nostril at night 16 g 11   Fluticasone-Salmeterol (ADVAIR) 100-50 MCG/DOSE AEPB Inhale 1 puff into the lungs daily.     omeprazole (PRILOSEC) 40 MG capsule Take 40 mg by mouth daily.     sodium fluoride (SODIUM FLUORIDE 5000 PPM) 1.1 % GEL dental gel Place pea-size drop into each tooth space of fluoride trays once a day at bedtime. Leave trays in mouth for 5 minutes, then remove and spit out excess fluoride. Do not rinse with water, eat or drink for at least 30 minutes after use. 120 mL 11   No current facility-administered medications for this visit.   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[place])   05/18/2020 Cancer Staging   Staging form: Pharynx - HPV-Mediated Oropharynx, AJCC 8th Edition - Clinical stage from 05/18/2020: Stage IV (cT4, cN3, cM1, p16+) - Signed by Eppie Gibson, MD on 12/10/2020    06/01/2020 Cancer Staging   Staging form: Pharynx - HPV-Mediated Oropharynx, AJCC 8th Edition - Pathologic stage from 06/01/2020: No Stage Recommended (cT4, cN3, cM1, p16+) - Signed by Benay Pike, MD on 06/01/2020 Stage prefix: Initial diagnosis    06/12/2020 - 08/21/2020 Chemotherapy          09/10/2020 - 09/10/2020 Chemotherapy          09/19/2020 - 11/09/2020 Chemotherapy   Patient is on Treatment Plan : HEAD/NECK  Cisplatin q7d       PHYSICAL EXAMINATION: ECOG PERFORMANCE STATUS: 1 - Symptomatic but completely ambulatory  Vitals:   01/11/21 1014  BP: 102/69  Pulse: 89  Resp: 16  Temp: 98.1 F (36.7 C)  SpO2: 100%   Filed Weights   01/11/21 1014  Weight: 249 lb 1.6 oz (113 kg)    GENERAL:alert, no distress and comfortable SKIN: skin color, texture, turgor are normal, no rashes or significant lesions EYES: normal, Conjunctiva are pink and non-injected, sclera clear OROPHARYNX:no exudate, no erythema and lips, buccal mucosa, and tongue normal  NECK: Noted significant lymphedema around his neck with slight limited mobility/range of motion LYMPH:  no palpable lymphadenopathy in the cervical, axillary or inguinal LUNGS: clear to auscultation and percussion with normal breathing effort HEART: regular rate & rhythm and no murmurs and no lower extremity edema ABDOMEN:abdomen soft, non-tender and normal bowel sounds Musculoskeletal:no cyanosis of digits and no clubbing  NEURO: alert & oriented x 3 with fluent speech, no focal motor/sensory deficits  LABORATORY DATA:  I have reviewed the data as listed    Component Value Date/Time   NA 141 01/11/2021 1000   K 3.9 01/11/2021 1000   CL 108 01/11/2021 1000  CO2 26 01/11/2021 1000   GLUCOSE 102 (H) 01/11/2021 1000   BUN 15 01/11/2021 1000   CREATININE 1.14 01/11/2021 1000   CREATININE 1.23 11/08/2020 0750   CALCIUM 9.4 01/11/2021 1000   PROT 6.8 01/11/2021 1000   ALBUMIN 4.0 01/11/2021 1000   AST 12 (L) 01/11/2021 1000   AST 17 09/19/2020 0826   ALT 9 01/11/2021 1000   ALT 15 09/19/2020 0826   ALKPHOS 46 01/11/2021 1000   BILITOT 1.0 01/11/2021 1000   BILITOT 0.6 09/19/2020 0826   GFRNONAA >60 01/11/2021 1000   GFRNONAA >60 11/08/2020 0750    No results found for: SPEP, UPEP  Lab Results  Component Value Date   WBC 5.5 01/11/2021   NEUTROABS 4.4 01/11/2021   HGB 12.2 (L) 01/11/2021   HCT 35.9 (L) 01/11/2021   MCV 100.0  01/11/2021   PLT 149 (L) 01/11/2021      Chemistry      Component Value Date/Time   NA 141 01/11/2021 1000   K 3.9 01/11/2021 1000   CL 108 01/11/2021 1000   CO2 26 01/11/2021 1000   BUN 15 01/11/2021 1000   CREATININE 1.14 01/11/2021 1000   CREATININE 1.23 11/08/2020 0750      Component Value Date/Time   CALCIUM 9.4 01/11/2021 1000   ALKPHOS 46 01/11/2021 1000   AST 12 (L) 01/11/2021 1000   AST 17 09/19/2020 0826   ALT 9 01/11/2021 1000   ALT 15 09/19/2020 0826   BILITOT 1.0 01/11/2021 1000   BILITOT 0.6 09/19/2020 0826

## 2021-01-11 NOTE — Assessment & Plan Note (Signed)
His leukopenia has resolved and anemia is improving The cause of mild thrombocytopenia is unknown, observe only we will plan to recheck in his next visit

## 2021-01-11 NOTE — Assessment & Plan Note (Signed)
He has significant lymphedema in his neck from recent treatment We discussed the importance of neck exercises I think it is important for him to follow-up with physical therapy for this and he is in agreement for referral

## 2021-01-11 NOTE — Assessment & Plan Note (Signed)
Clinically, he has swelling around his neck secondary to acquired lymphedema but no signs of persistent disease He is scheduled for imaging study next month with follow-up to see radiation oncologist I will schedule port flush in 8 weeks and for him to return to see his primary oncologist with plan to get his port removed if PET/CT imaging is normal In the meantime, we will continue supportive care, dietary and lifestyle modification, frequent neck exercises as tolerated as well as physical therapy referral for acquired lymphedema

## 2021-01-11 NOTE — Progress Notes (Signed)
Nutrition Follow-up:  Patient completed concurrent chemoradiation for tongue cancer. PEG removed 10/28.  Met with patient in office. He reports doing great. His taste continues to improve and eating a variety of foods (burgers, steaks, fish, chicken, pork, roasts, sweet potatoes, sushi, fries, eggs, vegetable, salads, soups) Patient reports ongoing dry mouth, "starchy foods." These are difficult for him to swallow. He is eating little bread, rice, cookies. Patient is filling up water bottle multiple times/day.    Medications: reviewed  Labs: Glucose 102  Anthropometrics: Weight 249 lb 1.6 oz today increased from 246 lb on 11/18.   Weights have decreased 12 lbs (4.6%) since PEG removed on 10/28. Patient weighed 261 lb 6.4 oz on 10/21. This is insignificant for time frame.   Weights have decreased 12.6 % from usual weight in 7 months. Patient weighed 285 lb on 4/26. This is significant   NUTRITION DIAGNOSIS: Inadequate oral intake improved   INTERVENTION:  Continue eating high calorie, high protein foods for weight maintenance Reviewed strategies for dry mouth, encouraged adding extra gravy/sauces to rice, breads, potatoes Continue baking soda, salt water rinses  Suggested drinking oral nutrition supplement for added calories and protein  Patient has contact information     MONITORING, EVALUATION, GOAL: weight trends, intake   NEXT VISIT: Tuesday, January 24 via telephone (pt aware)

## 2021-01-16 ENCOUNTER — Encounter: Payer: Self-pay | Admitting: Hematology and Oncology

## 2021-01-18 ENCOUNTER — Ambulatory Visit: Payer: PRIVATE HEALTH INSURANCE | Attending: Radiation Oncology

## 2021-01-18 ENCOUNTER — Other Ambulatory Visit: Payer: Self-pay

## 2021-01-18 DIAGNOSIS — R1313 Dysphagia, pharyngeal phase: Secondary | ICD-10-CM | POA: Insufficient documentation

## 2021-01-18 NOTE — Therapy (Signed)
[facility] [address] [place], [place], [address] Phone: [phone]   Fax:  [phone]  Speech Language Pathology Treatment/Renewal summary  Patient Details  Name: Alexander [name] MRN: [id number] Date of Birth: [birth year] Referring Provider (SLP): Eppie Gibson   Encounter Date: 01/18/2021   End of Session - 01/18/21 1631     Visit Number 4    Number of Visits 6    Date for SLP Re-Evaluation 04/18/21    SLP Start Time 9758    SLP Stop Time  1445    SLP Time Calculation (min) 40 min    Activity Tolerance Patient tolerated treatment well             Past Medical History:  Diagnosis Date   Asthma    Cancer ([place])    GERD (gastroesophageal reflux disease)    Sleep apnea    CPAP- does not wear    Past Surgical History:  Procedure Laterality Date   BRONCHIAL NEEDLE ASPIRATION BIOPSY  05/22/2020   Procedure: BRONCHIAL NEEDLE ASPIRATION BIOPSIES;  Surgeon: Garner Nash, DO;  Location: [facility];  Service: Pulmonary;;   DIRECT LARYNGOSCOPY  05/09/2020   Procedure: DIRECT LARYNGOSCOPY;  Surgeon: Rozetta Nunnery, MD;  Location: [facility];  Service: ENT;;   IR GASTROSTOMY TUBE MOD SED  09/13/2020   IR GASTROSTOMY TUBE REMOVAL  12/14/2020   IR IMAGING GUIDED PORT INSERTION  06/12/2020   MASS EXCISION N/A 05/09/2020   Procedure: EXCISION TONGUE MASS;  Surgeon: Rozetta Nunnery, MD;  Location: [facility];  Service: ENT;  Laterality: N/A;   TONSILLECTOMY     TRACHEOSTOMY TUBE PLACEMENT  05/09/2020   Procedure: TRACHEOSTOMY;  Surgeon: Rozetta Nunnery, MD;  Location: [facility];  Service: ENT;;   VIDEO BRONCHOSCOPY WITH ENDOBRONCHIAL ULTRASOUND N/A 05/22/2020   Procedure: VIDEO BRONCHOSCOPY WITH ENDOBRONCHIAL ULTRASOUND;  Surgeon: Garner Nash, DO;  Location: [facility];  Service: Pulmonary;  Laterality: N/A;    There were no vitals filed for this visit.   Subjective Assessment - 01/18/21 1451      Subjective Pt has had soft foods, crunchy foods, had hamburger buns    Currently in Pain? No/denies                   ADULT SLP TREATMENT - 01/18/21 1453       General Information   Behavior/Cognition Alert;Cooperative;Pleasant mood      Treatment Provided   Treatment provided Dysphagia      Dysphagia Treatment   Temperature Spikes Noted No    Oral Cavity - Dentition Adequate natural dentition    Treatment Methods Skilled observation;Therapeutic exercise;Patient/caregiver education    Patient observed directly with PO's Yes    Type of PO's observed Thin liquids    Liquids provided via Cup    Oral Phase Signs & Symptoms Other (comment)   none noted   Pharyngeal Phase Signs & Symptoms Other (comment)   none noted   Other treatment/comments Pt politely refused solid POs due to grits adhering to his tongue this morning - this was a very unpleasant experience, pt stated. He expressed frustration with dry mouth at nights. SLP suggested some ways to reduce xerostomia.at night. HEP "going ok" and pt has not met prescribed frequency of scope of HEP. SLP addressed this and reiterated rationale for HEP, and reviewed exercises with consistent min A, faded to modified independent. Pt expressed that he has been doing neck stretches since encouragement from med onc.  Assessment / Recommendations / Plan   Plan Continue with current plan of care      Progression Toward Goals   Progression toward goals Not progressing toward goals (comment)   lack of follow through with HEP             SLP Education - 01/18/21 1630     Education Details rationale for HEP, procedure for HEP, ways to help decr xerostomia    Person(s) Educated Patient    Methods Explanation;Demonstration;Verbal cues;Handout    Comprehension Verbalized understanding;Returned demonstration;Verbal cues required             SLP Short Term Goals -  01/18/2021      SLP SHORT TERM GOAL #1      Title pt will  complete HEP with rare min A    Time      Period --   vists, for all STGs    Status Not met            SLP SHORT TERM GOAL #2    Title pt will tell SLP why pt is completing HEP with modified independence    Time 1    Status Achieved           SLP SHORT TERM GOAL #3    Title pt will describe 3 overt s/s aspiration PNA with modified independence    Time 1    Status Not met - added to [place] #4    Title pt will tell SLP how a food journal could hasten return to a more normalized diet    Time 1    Status Deferred                     SLP Long Term Goals -  01/18/2021                     SLP LONG TERM GOAL #1    Title pt will complete HEP with modified independence over two visits    Time 2     Period --   visits, for all LTGs    Status Ongoing -renewed 01-18-21           SLP LONG TERM GOAL #2    Title pt will describe how to modify HEP over time, and the timeline associated with reduction in HEP frequency with modified independence over two sessions    Time 2    Status  Ongoing renewed 01-18-21       SLP LONG TERM GOAL #3  Title pt will describe 3 overt s/sx aspiration PNA  Time 2  Period    Status Ongoing - renewed 01-18-21                    Plan - 01/18/2021 1639       Clinical Impression Statement At this time pt swallowing is deemed WNL/WFL with thin liquids (pt refused solids), and SLP has no reason to believe pt has any safety issues with solids; There are no overt s/s aspiration reported by pt at this time with his POs at home; and no overt s/sx aspiration PNA reported by pt or observed by SLP today. SLP reviewed pt's individualized HEP for dysphagia and pt completed each exercise on their own with consistent min cues faded to modified independent. Pt has not been completing HEP with scope or frequency prescribed by  SLP. Data indicate that pt's swallow ability will likely decrease over the course of radiation/chemotherapy and could  very well decline over time following conclusion of their radiation therapy due to muscle disuse atrophy and/or muscle fibrosis. Pt will cont to need to be seen by SLP in order to assess safety of PO intake, assess the need for recommending any objective swallow assessment, and ensuring pt correctly completes the individualized HEP.    Speech Therapy Frequency --   once approx every four weeks    Duration --   90 days for this reporting period; overall, approx 7 visits    Treatment/Interventions Aspiration precaution training;Pharyngeal strengthening exercises;Diet toleration management by SLP;Trials of upgraded texture/liquids;Patient/family education;SLP instruction and feedback;Compensatory techniques    Potential to Achieve Goals Good    SLP Home Exercise Plan provided    Consulted and Agree with Plan of Care  pt          Patient will benefit from skilled therapeutic intervention in order to improve the following deficits and impairments:   Dysphagia, pharyngeal phase    Problem List Patient Active Problem List   Diagnosis Date Noted   Acquired lymphedema 01/11/2021   History of head and neck radiation 01/07/2021   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   Dysgeusia 01/07/2021   Accretions on teeth 01/07/2021   Pancytopenia, acquired ([place]) 12/07/2020   Dry mouth 12/07/2020   Leukopenia due to antineoplastic chemotherapy ([place]) 11/08/2020   Port-A-Cath in place 10/03/2020   Intractable hiccups 10/03/2020   Thrombocytopenia ([place]) 10/03/2020   Chemotherapy-induced fatigue 09/27/2020   Weight loss, unintentional 06/01/2020   Malnutrition of moderate degree 05/28/2020   GERD (gastroesophageal reflux disease)    Volume depletion    Hyperbilirubinemia    Hyponatremia    Mild protein-calorie malnutrition ([place])    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

## 2021-01-24 ENCOUNTER — Ambulatory Visit: Payer: BC Managed Care – PPO | Admitting: Physical Therapy

## 2021-02-13 NOTE — Progress Notes (Signed)
Alexander [name] presents today for follow-up after completing radiation to his base of tongue and subcarinal node on 11/07/2020 and to review PET scan result from 02/15/2021  Pain issues, if any: None Using a feeding tube?: N/A--removed 12/14/2020 Weight changes, if any: Yes, decline of several LBS monthly.  Swallowing issues, if any: Yes Last Alexander [name] on 01/18/2021: "At this time pt swallowing is deemed WNL/WFL with thin liquids (pt refused solids), and SLP has no reason to believe pt has any safety issues with solids; There are no overt s/s aspiration reported by pt at this time with his POs at home; and no overt s/sx aspiration PNA reported by pt or observed by SLP today. SLP reviewed pt's individualized HEP for dysphagia and pt completed each exercise on their own with consistent min cues faded to modified independent. Pt has not been completing HEP with scope or frequency prescribed by SLP." Smoking or chewing tobacco? No Using fluoride trays daily? Yes  Scheduled for F/U with Alexander [name] on 03/06/2021 Last ENT visit was on: September 5th, 2022- per patient.  Not since diagnosis (Alexander [name] recently retired) Other notable issues, if any: Tingling in soles of feet and they remain cold. Scheduled for F/U with medical oncologist on 03/08/2021

## 2021-02-15 ENCOUNTER — Ambulatory Visit (HOSPITAL_COMMUNITY)
Admission: RE | Admit: 2021-02-15 | Discharge: 2021-02-15 | Disposition: A | Discharge status: Home / Self Care | Payer: PRIVATE HEALTH INSURANCE | Source: Ambulatory Visit | Attending: Radiation Oncology | Admitting: Radiation Oncology

## 2021-02-15 ENCOUNTER — Other Ambulatory Visit: Payer: Self-pay

## 2021-02-15 DIAGNOSIS — C109 Malignant neoplasm of oropharynx, unspecified: Secondary | ICD-10-CM | POA: Diagnosis not present

## 2021-02-15 LAB — GLUCOSE, CAPILLARY: Glucose-Capillary: 101 mg/dL — ABNORMAL HIGH (ref 70–99)

## 2021-02-15 MED ORDER — FLUDEOXYGLUCOSE F - 18 (FDG) INJECTION
12.5000 | Freq: Once | INTRAVENOUS | Status: AC
  Administered 2021-02-15: 07:00:00 12.28 via INTRAVENOUS

## 2021-02-19 ENCOUNTER — Ambulatory Visit
Admission: RE | Admit: 2021-02-19 | Discharge: 2021-02-19 | Disposition: A | Discharge status: Home / Self Care | Payer: PRIVATE HEALTH INSURANCE | Source: Ambulatory Visit | Attending: Radiation Oncology | Admitting: Radiation Oncology

## 2021-02-19 ENCOUNTER — Other Ambulatory Visit: Payer: Self-pay

## 2021-02-19 ENCOUNTER — Encounter: Payer: Self-pay | Admitting: Radiation Oncology

## 2021-02-19 VITALS — BP 96/68 | HR 93 | Temp 97.5°F | Resp 20 | Ht 72.0 in | Wt 246.0 lb

## 2021-02-19 DIAGNOSIS — R918 Other nonspecific abnormal finding of lung field: Secondary | ICD-10-CM | POA: Insufficient documentation

## 2021-02-19 DIAGNOSIS — R634 Abnormal weight loss: Secondary | ICD-10-CM | POA: Diagnosis not present

## 2021-02-19 DIAGNOSIS — I890 Lymphedema, not elsewhere classified: Secondary | ICD-10-CM | POA: Insufficient documentation

## 2021-02-19 DIAGNOSIS — C109 Malignant neoplasm of oropharynx, unspecified: Secondary | ICD-10-CM

## 2021-02-19 DIAGNOSIS — Z85810 Personal history of malignant neoplasm of tongue: Secondary | ICD-10-CM | POA: Insufficient documentation

## 2021-02-19 DIAGNOSIS — C01 Malignant neoplasm of base of tongue: Secondary | ICD-10-CM

## 2021-02-19 NOTE — Progress Notes (Signed)
Radiation Oncology         [phone] ________________________________  Name: Alexander [name] MRN: [id number]  Date: 02/19/2021  DOB: [birth year]  Follow-Up Visit Note  Outpatient  CC: Pcp, No  Alexander [name], *  Diagnosis and Prior Radiotherapy: C01   ICD-10-CM   1. 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)  C01       Cancer Staging  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) Staging form: Pharynx - HPV-Mediated Oropharynx, AJCC 8th Edition - Clinical stage from 05/18/2020: Stage IV (cT4, cN3, cM1, p16+) - Signed by Eppie Gibson, MD on 12/10/2020 - Pathologic stage from 06/01/2020: No Stage Recommended (cT4, cN3, cM1, p16+) - Signed by Benay Pike, MD on 06/01/2020 Stage prefix: Initial diagnosis    Radiation Treatment Dates: 09/19/2020 through 11/07/2020 Site Technique Total Dose (Gy) Dose per Fx (Gy) Completed Fx Beam Energies  Neck: HN_BOT IMRT 70/70 2 35/35 6X  Mediastinum: Chest_subcari IMRT 66/66 2 33/33 6X   CHIEF COMPLAINT: Here for follow-up and surveillance of throat cancer  Narrative:   Alexander [name] presents today for follow-up after completing radiation to his base of tongue and subcarinal node on 11/07/2020 and to review PET scan result from 02/15/2021  Pain issues, if any: None Using a feeding tube?: N/A--removed 12/14/2020 Weight changes, if any: Yes, decline of several LBS monthly. PEG removed.  Swallowing issues, if any: Yes Last Bridgett Larsson on 01/18/2021: "At this time pt swallowing is deemed WNL/WFL with thin liquids (pt refused solids), and SLP has no reason to believe pt has any safety issues with solids; There are no overt s/s aspiration reported by pt at this time with his POs at home; and no overt s/sx aspiration PNA reported by pt or observed by SLP today. SLP reviewed pt's individualized HEP for dysphagia and pt completed each exercise on their own with consistent min cues faded to  modified independent. Pt has not been completing HEP with scope or frequency prescribed by SLP." Smoking or chewing tobacco? No Using fluoride trays daily? Yes  Scheduled for F/U with Dr. Sandi Mariscal on 03/06/2021 Last ENT visit was on: September 5th, 2022- per patient.  Not since diagnosis (Dr. Melony Overly recently retired) Other notable issues, if any: Tingling in soles of feet and they remain cold. Scheduled for F/U with medical oncologist on 03/08/2021  Feels well overall.      ALLERGIES:  has No Known Allergies.  Meds: Current Outpatient Medications  Medication Sig Dispense Refill   albuterol (PROVENTIL) (2.5 MG/3ML) 0.083% nebulizer solution Take 3 mLs (2.5 mg total) by nebulization every 6 (six) hours as needed for wheezing or shortness of breath. 120 mL 12   albuterol (VENTOLIN HFA) 108 (90 Base) MCG/ACT inhaler Inhale 2 puffs into the lungs as needed for wheezing or shortness of breath.     cetirizine (ZYRTEC) 10 MG tablet Take 10 mg by mouth daily.     fluticasone (FLONASE) 50 MCG/ACT nasal spray Place 2 sprays into both nostrils daily. Place 1 spray each nostril at night 16 g 11   Fluticasone-Salmeterol (ADVAIR) 100-50 MCG/DOSE AEPB Inhale 1 puff into the lungs daily.     omeprazole (PRILOSEC) 40 MG capsule Take 40 mg by mouth daily.     sodium fluoride (SODIUM FLUORIDE 5000 PPM) 1.1 % GEL dental gel Place pea-size drop into each tooth space of fluoride trays once a day at bedtime. Leave trays in mouth  for 5 minutes, then remove and spit out excess fluoride. Do not rinse with water, eat or drink for at least 30 minutes after use. 120 mL 11   No current facility-administered medications for this encounter.   Facility-Administered Medications Ordered in Other Encounters  Medication Dose Route Frequency Provider Last Rate Last Admin   magnesium sulfate 2 GM/50ML IVPB             Physical Findings: The patient is in no acute distress. Patient is alert and oriented.   height is 6' (1.829 m) and weight is 246 lb (111.6 kg). His temporal temperature is 97.5 F (36.4 C) (abnormal). His blood pressure is 96/68 and his pulse is 93. His respiration is 20 and oxygen saturation is 99%. [name]    HEENT: No evidence of tumor in the mouth or upper throat.  Floor of mouth and tongue palpated.  He has thick saliva but no oral thrush.  Skin: Has healed beautifully over his neck. NECK: No palpable masses in the cervical or supraclavicular neck. + lymphedema anteriorly  Lab Findings: Lab Results  Component Value Date   WBC 5.5 01/11/2021   HGB 12.2 (L) 01/11/2021   HCT 35.9 (L) 01/11/2021   MCV 100.0 01/11/2021   PLT 149 (L) 01/11/2021    Radiographic Findings: NM PET Image Restag (PS) Skull Base To Thigh  Result Date: 02/16/2021 CLINICAL DATA:  Subsequent treatment strategy for oropharyngeal carcinoma. EXAM: NUCLEAR MEDICINE PET SKULL BASE TO THIGH TECHNIQUE: 12.28 mCi F-18 FDG was injected intravenously. Full-ring PET imaging was performed from the skull base to thigh after the radiotracer. CT data was obtained and used for attenuation correction and anatomic localization. Fasting blood glucose: 101 mg/dl COMPARISON:  PET-CT 08/06/2020 and 05/24/2020 FINDINGS: Mediastinal blood pool activity: SUV max 2.59 Liver activity: SUV max NA NECK: Much improved appearance of the neck when compared to the prior to PET CTs. The large hypermetabolic mass tongue base has resolved. I do not see any obvious residual tumor on the CT scan or hypermetabolism on the PET-CT. There is a focus of hypermetabolism in the tongue base anteriorly near the mandible. This could be a small focus of residual or recurrent tumor. Recommend correlation with direct visualization. I do not see any obvious abnormality on the CT scan. Small residual bilateral level 2 lymph nodes are noted. The left-sided node on image 39/4 measures 10 mm and previously measured 17.5 mm. SUV max is 4.37 and was previously 8.52.  Right-sided node on image 41/4 measures 8.5 mm and SUV max is 3.02. This previously measured 19 mm and SUV max was 7.46. The bilateral level 3 nodes have resolved. Right-sided supraclavicular nodes have resolved also. Incidental CT findings: none CHEST: No enlarged or hypermetabolic mediastinal or hilar lymph nodes. Patchy ill-defined ground-glass type infiltrates noted in the right upper lobe and adjacent right lower lobe in the azygoesophageal recess. This shows mild hypermetabolism with SUV max of 4.36 and is most consistent with a small infiltrate. No worrisome pulmonary nodules are identified. Incidental CT findings: The Port-A-Cath is in good position. ABDOMEN/PELVIS: No abnormal hypermetabolic activity within the liver, pancreas, adrenal glands, or spleen. No hypermetabolic lymph nodes in the abdomen or pelvis. Incidental CT findings: none SKELETON: No focal hypermetabolic activity to suggest skeletal metastasis. Incidental CT findings: none IMPRESSION: 1. Improved findings when compared to prior PET-CT suggesting a good response to treatment. Small focus of residual hypermetabolism in the anterior aspect of the floor the mouth and small residual slightly hypermetabolic  level 2 lymph nodes. Recommend continued surveillance. 2. No findings for metastatic disease involving the chest, abdomen or pelvis. 3. Small patchy infiltrates in the right lung. Electronically Signed   By: Marijo Sanes M.D.   On: 02/16/2021 16:55    Impression/Plan:    1) Head and Neck Cancer Status: in remission - will get PET in 4 mo to continue surveillance of mildly hypermetabolic areas  2) Nutritional Status: no active issues, PEG removed, pt is pleased with weight and eating. Some Wt loss noted. PEG tube: none  3) Swallowing: SLP exercises will continue, he is pleased with swallowing overall  4) Lymphedema of neck: scheduled to see PT  5) Dental: Encouraged to continue regular followup with dentistry, and dental hygiene  including fluoride rinses.   6) Thyroid function - check annually in med onc Lab Results  Component Value Date   TSH 1.313 01/11/2021   7) Follow-up in 4 months w/ PET. The patient was encouraged to call with any issues or questions before then.  On date of service, in total, I spent 25 minutes on this encounter. Patient was seen in person.  _____________________________________   Eppie Gibson, MD

## 2021-02-19 NOTE — Progress Notes (Signed)
Oncology Nurse Navigator Documentation   I met with Alexander [name] during his follow up appointment with Dr. Isidore Moos today. He is doing well at this time and was happy to receive good results from his recent PET from Dr. Isidore Moos. He is aware that I will call him after ENT conference tomorrow to let him know the plan of care for continued follow up.  [name] RN, BSN, OCN Head & Neck Oncology Nurse [place] at [facility] Phone # [phone]  Fax # [phone]

## 2021-02-20 ENCOUNTER — Encounter: Payer: Self-pay | Admitting: Physical Therapy

## 2021-02-20 ENCOUNTER — Ambulatory Visit: Payer: PRIVATE HEALTH INSURANCE | Admitting: Physical Therapy

## 2021-02-20 ENCOUNTER — Ambulatory Visit: Payer: PRIVATE HEALTH INSURANCE | Attending: Radiation Oncology

## 2021-02-20 ENCOUNTER — Other Ambulatory Visit: Payer: Self-pay

## 2021-02-20 DIAGNOSIS — I890 Lymphedema, not elsewhere classified: Secondary | ICD-10-CM

## 2021-02-20 DIAGNOSIS — R1313 Dysphagia, pharyngeal phase: Secondary | ICD-10-CM | POA: Diagnosis present

## 2021-02-20 DIAGNOSIS — C109 Malignant neoplasm of oropharynx, unspecified: Secondary | ICD-10-CM | POA: Insufficient documentation

## 2021-02-20 DIAGNOSIS — C01 Malignant neoplasm of base of tongue: Secondary | ICD-10-CM

## 2021-02-20 DIAGNOSIS — R293 Abnormal posture: Secondary | ICD-10-CM

## 2021-02-20 NOTE — Therapy (Signed)
[facility] @ [place], [place], [address] Phone: [phone]   Fax:  [phone]  Physical Therapy Evaluation  Patient Details  Name: Alexander [name] MRN: [id number] Date of Birth: Jul 07, 1960 Referring Provider (PT): Alvy Bimler   Encounter Date: 02/20/2021   PT End of Session - 02/20/21 1646     Visit Number 1    Number of Visits 9    Date for PT Re-Evaluation 03/20/21    PT Start Time 1602    PT Stop Time 9735    PT Time Calculation (min) 41 min    Activity Tolerance Patient tolerated treatment well    Behavior During Therapy [facility] for tasks assessed/performed             Past Medical History:  Diagnosis Date   Asthma    Cancer ([place])    GERD (gastroesophageal reflux disease)    Sleep apnea    CPAP- does not wear    Past Surgical History:  Procedure Laterality Date   BRONCHIAL NEEDLE ASPIRATION BIOPSY  05/22/2020   Procedure: BRONCHIAL NEEDLE ASPIRATION BIOPSIES;  Surgeon: Garner Nash, DO;  Location: [place];  Service: Pulmonary;;   DIRECT LARYNGOSCOPY  05/09/2020   Procedure: DIRECT LARYNGOSCOPY;  Surgeon: Rozetta Nunnery, MD;  Location: [place];  Service: ENT;;   IR GASTROSTOMY TUBE MOD SED  09/13/2020   IR GASTROSTOMY TUBE REMOVAL  12/14/2020   IR IMAGING GUIDED PORT INSERTION  06/12/2020   MASS EXCISION N/A 05/09/2020   Procedure: EXCISION TONGUE MASS;  Surgeon: Rozetta Nunnery, MD;  Location: [place];  Service: ENT;  Laterality: N/A;   TONSILLECTOMY     TRACHEOSTOMY TUBE PLACEMENT  05/09/2020   Procedure: TRACHEOSTOMY;  Surgeon: Rozetta Nunnery, MD;  Location: [place];  Service: ENT;;   VIDEO BRONCHOSCOPY WITH ENDOBRONCHIAL ULTRASOUND N/A 05/22/2020   Procedure: VIDEO BRONCHOSCOPY WITH ENDOBRONCHIAL ULTRASOUND;  Surgeon: Garner Nash, DO;  Location: [facility];  Service: Pulmonary;  Laterality: N/A;    There were no vitals filed for this visit.    Subjective Assessment -  02/20/21 1602     Subjective My neck started swelling in November. It feels more firm now then it did then.    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 That portion of the mass had poorly defined margins but appeared to cross the midline and was estimated to measure at least 5.0 x 4.7 x 3.4 cm. The epiglottis and aryeglottic folds were not well separately delineated and may have been involved by mass. Tumor extension was suspected inferiorly at least to the level of the supraglottic larynx, 05/11/20 CT chest showed subcarinal mass/adenopathy and low lung volumes with bibasilar atelectasis. However, there was no acute intrathoracic pathology, 05/03/20 He saw Dr. [name] who performed a fiberoptic laryngoscopy through the right nostril. Nasopharyngeal was clear, but the base of the tongue was full on the left side and epiglottis was slightly pushed back posteriorly with questionable ulcer on the left base of the tongue extending down to the left vallecular area, biopsy of the base of the tongue on 05/09/2020 revealed: invasive squamous cell carcinoma.  Strongly p16 positive;  emergent tracheostomy was also performed at that time, received induction chemotherapy of 3 cycles of Carbo/5 FU, will now receive 35 fractions of radiation to his Base of tongue and bilateral neck and 7 weeks of cisplatin chemotherapy. He will also receive radiation to his subcarinal node in his chest area. He started on 09/19/20 and will complete 11/07/20, PAC 06/12/20, PEG 09/13/20, Tracheostomy placed 05/09/20 and decannulated 08/31/20.    Patient Stated Goals to limit the accumulation of lymphatic fluid in my neck    Currently in Pain?  No/denies    Pain Score 0-No pain                [facility] PT Assessment - 02/20/21 0001       Assessment   Medical Diagnosis SCC base of tongue    Referring Provider (PT) G[place]uch    Onset Date/Surgical Date 05/09/20    Hand Dominance Right    Prior Therapy baselines at [facility]      Precautions   Precautions Other (comment)    Precaution Comments lymphedema      Restrictions   Weight Bearing Restrictions No      Balance Screen   Has the patient fallen in the past 6 months No    Has the patient had a decrease in activity level because of a fear of falling?  No    Is the patient reluctant to leave their home because of a fear of falling?  No      Home Environment   Living Environment Private residence    Living Arrangements Alone    Available Help at Discharge Family    Type of [place] Access Level entry    Home Layout One level      Prior Function   Level of [place] On disability    Leisure pt reports he had started but then stopped but plans to begin again      Cognition   Overall Cognitive Status Within Functional Limits for tasks assessed      Observation/Other Assessments   Observations visible fullness at anterior neck      Functional Tests   Functional tests Sit to Stand      Sit to Stand   Comments 30 sec sit to stand: 16 reps today      Posture/Postural Control   Posture/Postural Control Postural limitations    Postural Limitations Rounded Shoulders;Forward head      AROM   Cervical Flexion WFL    Cervical Extension WFL    Cervical - Right Side Bend WFL    Cervical - Left Side Bend WFL    Cervical - Right Rotation WFL    Cervical - Left Rotation [facility]      Ambulation/Gait   Ambulation/Gait Yes    Ambulation/Gait Assistance 7: Independent    Ambulation Distance (Feet) 10 Feet               LYMPHEDEMA/ONCOLOGY QUESTIONNAIRE - 02/20/21 0001       Head and Neck   4 cm superior to sternal  notch around neck 45.5 cm    6 cm superior to sternal notch around neck 46.5 cm    8 cm superior to sternal notch around neck 48.1 cm                     Objective measurements completed on examination: See above findings.       [place]  Adult PT Treatment/Exercise - 02/20/21 0001       Manual Therapy   Manual Therapy Manual Lymphatic Drainage (MLD);Edema management    Edema Management created foam chip pack for pt to wear to add compression to anterior neck    Manual Lymphatic Drainage (MLD) in supine with head elevated: short neck, 5 diaphragmatic breaths, bilateral axillary nodes, bilateral pectoral nodes, bilateral chest, short neck, posterior, lateral and anterior neck then retracing all steps - educated pt throughout about anatomy and physiology of the lymphatic system and correct skin stretch technique and verbalized steps from [facility] anterior approach handout- issued handout to pt                          PT Long Term Goals - 02/20/21 1647       PT LONG TERM GOAL #1   Title Pt will be independent in self MLD for long term management of lymphedema.    Time 4    Period Weeks    Status New    Target Date 03/20/21      PT LONG TERM GOAL #2   Title Pt will obtain appropriate compression garment for long term management of lymphedema.    Time 4    Period Weeks    Status New    Target Date 03/20/21      PT LONG TERM GOAL #3   Title Pt will report a 50% decrease in edema at anterior neck to decrease risk of infection.    Time 4    Period Weeks    Status New    Target Date 03/20/21                    Plan - 02/20/21 1649     Clinical Impression Statement Pt returns to PT with anterior neck lymphedema after completing radiation for treatment of base of tongue cancer in September 2022. He reports the swelling began in November and due to scheduling issues he was unable to be seen until now. His anterior neck is fibrotic. Created a foam  chip pack to help reduce swelling and fibrosis. Began MLD to anterior neck today while verbally educating pt in technique. Pt would benefit from skilled PT services to decrease anterior neck swelling, assist pt with obtaining a compression garment and independent management of lymphedema.    Rehab Potential Good    PT Frequency 2x / week    PT Duration 4 weeks    PT Treatment/Interventions ADLs/Self Care Home Management;Patient/family education;Therapeutic exercise;Manual techniques;Manual lymph drainage;Orthotic Fit/Training;Compression bandaging;Taping;Vasopneumatic Device    PT Next Visit Plan instruct in self MLD and have pt return demonstrate, eventually get compression garment, discuss pump with pt to see if he is interested    PT Home Exercise Plan head and neck ROM exercises    Consulted and Agree with Plan of Care Patient             Patient will benefit from skilled therapeutic intervention in order to improve the following deficits and impairments:  Postural dysfunction, Decreased knowledge of precautions, Increased edema  Visit Diagnosis: Lymphedema, not elsewhere classified  Abnormal posture  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[facility])     Problem List Patient Active Problem List   Diagnosis Date Noted   Acquired lymphedema 01/11/2021   History of head and neck radiation 01/07/2021   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   Dysgeusia 01/07/2021   Accretions on teeth 01/07/2021   Pancytopenia, acquired ([place])  12/07/2020   Dry mouth 12/07/2020   Leukopenia due to antineoplastic chemotherapy ([place]) 11/08/2020   Port-A-Cath in place 10/03/2020   Intractable hiccups 10/03/2020   Thrombocytopenia ([place]) 10/03/2020   Chemotherapy-induced fatigue 09/27/2020   Weight loss, unintentional 06/01/2020   Malnutrition of moderate degree 05/28/2020   GERD (gastroesophageal reflux disease)    Volume depletion    Hyperbilirubinemia    Hyponatremia    Mild protein-calorie malnutrition (HCC)     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[place]) 05/17/2020   Mediastinal adenopathy 05/17/2020    Manus Gunning, PT 02/20/2021, 4:52 PM  [facility] @ [place], [place], [address] Phone: [phone]   Fax:  [phone]  Name: Alexander [name] MRN: [id number] Date of Birth: 1960-08-29

## 2021-02-21 NOTE — Therapy (Signed)
[facility] [address], [place], [place], [address] Phone: [phone]   Fax:  [phone]  Speech Language Pathology Treatment  Patient Details  Name: Alexander [name] MRN: [id number] Date of Birth: [birth year] Referring Provider (SLP): Eppie Gibson   Encounter Date: 02/20/2021   End of Session - 02/21/21 1028     Visit Number 5    Number of Visits 6    Date for SLP Re-Evaluation 04/18/21    SLP Start Time 1448    SLP Stop Time  1530    SLP Time Calculation (min) 42 min    Activity Tolerance Patient tolerated treatment well             Past Medical History:  Diagnosis Date   Asthma    Cancer ([place])    GERD (gastroesophageal reflux disease)    Sleep apnea    CPAP- does not wear    Past Surgical History:  Procedure Laterality Date   BRONCHIAL NEEDLE ASPIRATION BIOPSY  05/22/2020   Procedure: BRONCHIAL NEEDLE ASPIRATION BIOPSIES;  Surgeon: Garner Nash, DO;  Location: [facility];  Service: Pulmonary;;   DIRECT LARYNGOSCOPY  05/09/2020   Procedure: DIRECT LARYNGOSCOPY;  Surgeon: Rozetta Nunnery, MD;  Location: [facility];  Service: ENT;;   IR GASTROSTOMY TUBE MOD SED  09/13/2020   IR GASTROSTOMY TUBE REMOVAL  12/14/2020   IR IMAGING GUIDED PORT INSERTION  06/12/2020   MASS EXCISION N/A 05/09/2020   Procedure: EXCISION TONGUE MASS;  Surgeon: Rozetta Nunnery, MD;  Location: [facility];  Service: ENT;  Laterality: N/A;   TONSILLECTOMY     TRACHEOSTOMY TUBE PLACEMENT  05/09/2020   Procedure: TRACHEOSTOMY;  Surgeon: Rozetta Nunnery, MD;  Location: [place];  Service: ENT;;   VIDEO BRONCHOSCOPY WITH ENDOBRONCHIAL ULTRASOUND N/A 05/22/2020   Procedure: VIDEO BRONCHOSCOPY WITH ENDOBRONCHIAL ULTRASOUND;  Surgeon: Garner Nash, DO;  Location: [place];  Service: Pulmonary;  Laterality: N/A;    There were no vitals filed for this visit.   Subjective Assessment - 02/21/21 0903     Subjective Pt is staying away from  breads.    Currently in Pain? No/denies                   ADULT SLP TREATMENT - 02/21/21 0001       General Information   Behavior/Cognition Alert;Cooperative;Pleasant mood      Dysphagia Treatment   Oral Cavity - Dentition Adequate natural dentition    Treatment Methods Skilled observation;Therapeutic exercise;Patient/caregiver education    Patient observed directly with PO's Yes    Type of PO's observed Thin liquids    Liquids provided via --   bottle   Oral Phase Signs & Symptoms Other (comment)   none noted   Pharyngeal Phase Signs & Symptoms Other (comment)   none noted   Other treatment/comments Pt was indpendent with HEP and is better at recommended consistency and scope of HEP ub not yet at prescribed frequency and scope. He reports eating a wide variety of foods, while refraining from anything with bread/buns.      Assessment / Recommendations / Plan   Plan Other (Comment)   every other month            SLP Short Term Goals -  02/21/2021 0001        SLP SHORT TERM GOAL #1      Title pt will complete HEP with rare min A    Time  Period --   vists, for all STGs    Status Not met            SLP SHORT TERM GOAL #2    Title pt will tell SLP why pt is completing HEP with modified independence    Time 1    Status Achieved           SLP SHORT TERM GOAL #3    Title pt will describe 3 overt s/s aspiration PNA with modified independence    Time 1    Status Not met - added to [place] #4    Title pt will tell SLP how a food journal could hasten return to a more normalized diet    Time 1    Status Deferred                     SLP Long Term Goals  02/21/2021 0001                    SLP LONG TERM GOAL #1    Title pt will complete HEP with modified independence over two visits    Time 2     Period --   visits, for all LTGs    Status Ongoing -renewed 01-18-21           SLP LONG TERM GOAL #2    Title pt will describe  how to modify HEP over time, and the timeline associated with reduction in HEP frequency with modified independence over two sessions    Time 2    Status  Ongoing renewed 01-18-21         SLP LONG TERM GOAL #3  Title pt will describe 3 overt s/sx aspiration PNA  Time 2  Period    Status Ongoing - renewed 01-18-21                    Plan - 02/21/2021 0001        Clinical Impression Statement At this time pt swallowing is deemed WNL/WFL with dys III and thin liquids; There are no overt s/s aspiration reported by pt at this time with his POs at home; and no overt s/sx aspiration PNA reported by pt or observed by SLP today. SLP reviewed pt's individualized HEP for dysphagia and pt completed each exercise on their own with independence. Pt has been more consistent at completing HEP scope or frequency but not yet at level  prescribed by SLP. Data indicate that pt's swallow ability will likely decrease over the course of radiation/chemotherapy and could very well decline over time following conclusion of their radiation therapy due to muscle disuse atrophy and/or muscle fibrosis. Pt will cont to need to be seen by SLP in order to assess safety of PO intake, assess the need for recommending any objective swallow assessment, and ensuring pt correctly completes the individualized HEP.    Speech Therapy Frequency --   once approx every eight weeks    Duration --   90 days for this reporting period; overall, approx 7 visits    Treatment/Interventions Aspiration precaution training;Pharyngeal strengthening exercises;Diet toleration management by SLP;Trials of upgraded texture/liquids;Patient/family education;SLP instruction and feedback;Compensatory techniques    Potential to Achieve Goals Good            Patient will benefit from skilled therapeutic intervention in order to improve the following deficits and impairments:  Dysphagia, pharyngeal phase    Problem List Patient Active Problem  List   Diagnosis Date Noted   Acquired lymphedema 01/11/2021   History of head and neck radiation 01/07/2021   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   Dysgeusia 01/07/2021   Accretions on teeth 01/07/2021   Pancytopenia, acquired ([place]) 12/07/2020   Dry mouth 12/07/2020   Leukopenia due to antineoplastic chemotherapy ([place]) 11/08/2020   Port-A-Cath in place 10/03/2020   Intractable hiccups 10/03/2020   Thrombocytopenia ([place]) 10/03/2020   Chemotherapy-induced fatigue 09/27/2020   Weight loss, unintentional 06/01/2020   Malnutrition of moderate degree 05/28/2020   GERD (gastroesophageal reflux disease)    Volume depletion    Hyperbilirubinemia    Hyponatremia    Mild protein-calorie malnutrition (HCC)    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[place]) 05/17/2020   Mediastinal adenopathy 05/17/2020    Waymon Laser, [place] 02/21/2021, 10:34 AM  [facility] [address] [place], [place], [address] Phone: [phone]   Fax:  [phone]   Name: Alexander [name] MRN: [id number] Date of Birth: [birth year]

## 2021-02-26 ENCOUNTER — Encounter: Payer: Self-pay | Admitting: Hematology and Oncology

## 2021-02-28 ENCOUNTER — Encounter: Payer: Self-pay | Admitting: Hematology and Oncology

## 2021-03-04 ENCOUNTER — Encounter: Payer: PRIVATE HEALTH INSURANCE | Admitting: Rehabilitation

## 2021-03-06 ENCOUNTER — Other Ambulatory Visit: Payer: Self-pay

## 2021-03-06 ENCOUNTER — Encounter (HOSPITAL_COMMUNITY): Payer: Self-pay | Admitting: Dentistry

## 2021-03-06 ENCOUNTER — Encounter: Payer: Self-pay | Admitting: Physical Therapy

## 2021-03-06 ENCOUNTER — Ambulatory Visit (INDEPENDENT_AMBULATORY_CARE_PROVIDER_SITE_OTHER): Payer: Worker's Compensation | Admitting: Dentistry

## 2021-03-06 ENCOUNTER — Ambulatory Visit: Payer: PRIVATE HEALTH INSURANCE | Admitting: Physical Therapy

## 2021-03-06 VITALS — BP 124/68 | HR 88 | Temp 99.2°F

## 2021-03-06 DIAGNOSIS — C109 Malignant neoplasm of oropharynx, unspecified: Secondary | ICD-10-CM

## 2021-03-06 DIAGNOSIS — K08109 Complete loss of teeth, unspecified cause, unspecified class: Secondary | ICD-10-CM

## 2021-03-06 DIAGNOSIS — K0850 Unsatisfactory restoration of tooth, unspecified: Secondary | ICD-10-CM

## 2021-03-06 DIAGNOSIS — K030 Excessive attrition of teeth: Secondary | ICD-10-CM

## 2021-03-06 DIAGNOSIS — K036 Deposits [accretions] on teeth: Secondary | ICD-10-CM

## 2021-03-06 DIAGNOSIS — I890 Lymphedema, not elsewhere classified: Secondary | ICD-10-CM

## 2021-03-06 DIAGNOSIS — M2632 Excessive spacing of fully erupted teeth: Secondary | ICD-10-CM

## 2021-03-06 DIAGNOSIS — R293 Abnormal posture: Secondary | ICD-10-CM

## 2021-03-06 DIAGNOSIS — Z0120 Encounter for dental examination and cleaning without abnormal findings: Secondary | ICD-10-CM | POA: Diagnosis not present

## 2021-03-06 DIAGNOSIS — Z923 Personal history of irradiation: Secondary | ICD-10-CM

## 2021-03-06 DIAGNOSIS — K117 Disturbances of salivary secretion: Secondary | ICD-10-CM

## 2021-03-06 DIAGNOSIS — K032 Erosion of teeth: Secondary | ICD-10-CM

## 2021-03-06 DIAGNOSIS — Y842 Radiological procedure and radiotherapy as the cause of abnormal reaction of the patient, or of later complication, without mention of misadventure at the time of the procedure: Secondary | ICD-10-CM

## 2021-03-06 DIAGNOSIS — K056 Periodontal disease, unspecified: Secondary | ICD-10-CM

## 2021-03-06 DIAGNOSIS — R1313 Dysphagia, pharyngeal phase: Secondary | ICD-10-CM | POA: Diagnosis not present

## 2021-03-06 DIAGNOSIS — M264 Malocclusion, unspecified: Secondary | ICD-10-CM

## 2021-03-06 DIAGNOSIS — M270 Developmental disorders of jaws: Secondary | ICD-10-CM

## 2021-03-06 DIAGNOSIS — K029 Dental caries, unspecified: Secondary | ICD-10-CM

## 2021-03-06 NOTE — Progress Notes (Signed)
Department of Dental Medicine   Service Date:   03/06/2021  Patient Name:  Alexander [name] Date of Birth:   09-18-1960 Medical Record Number: [id number]  Referring Provider:           Eppie Gibson, M.D.   TODAY'S VISIT: COMPREHENSIVE EXAM   EXAM: Soft tissue:  Xerostomia causing dry mucous membranes & viscous, thick saliva Caries risk:  High Periodontal impression:  Localized chronic periodontitis, accretions on teeth Other findings:  Attrition, severe abfractions, defective/missing restorations,   PLAN:  After a discussion & exploring various treatment options, the following treatment plan was finalized: Adult prophylaxis (completed today) Restorative  Recall interval:  Bitewings- 6 mos (update these at next visit), Prophylaxis + monitor probing depths- 4 mos  Next visit:   Restorative per treatment plan       03/06/2021  PROGRESS NOTE:    COVID-19 SCREENING:  The patient denies symptoms concerning for COVID-19 infection including fever, chills, cough, or newly developed shortness of breath.   HISTORY OF PRESENT ILLNESS: Alexander [name] is a very pleasant 61 y.o. 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   DENTAL HISTORY: The patient is a patient of record at the hospital dental clinic.  He was seen initially in 08/2020 for a pre-radiation dental consultation and then for a follow-up after he finished therapy.  He does not have a dentist that he sees regularly, so he asked about returning to our clinic for routine dental care since his insurance is in-network.  He has fluoride trays which were delivered on 01/04/2021 at his follow-up visit and he continues to use them.  He continues to struggle with dry mouth after radiation and uses Biotene as needed.  He currently denies any dental/orofacial pain or sensitivity. Patient is able to manage oral secretions.  Patient  denies dysphagia, odynophagia, dysphonia, SOB and neck pain.  Patient denies fever, rigors and malaise.   CHIEF COMPLAINT: Here for a comprehensive dental exam; patient with no complaints.   Patient Active Problem List   Diagnosis Date Noted   Acquired lymphedema 01/11/2021   History of head and neck radiation 01/07/2021   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   Dysgeusia 01/07/2021   Accretions on teeth 01/07/2021   Pancytopenia, acquired ([place]) 12/07/2020   Dry mouth 12/07/2020   Leukopenia due to antineoplastic chemotherapy ([place]) 11/08/2020   Port-A-Cath in place 10/03/2020   Intractable hiccups 10/03/2020   Thrombocytopenia ([place]) 10/03/2020   Chemotherapy-induced fatigue 09/27/2020   Weight loss, unintentional 06/01/2020   Malnutrition of moderate degree 05/28/2020   GERD (gastroesophageal reflux disease)    Volume depletion    Hyperbilirubinemia    Hyponatremia    Mild protein-calorie malnutrition ([place])    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   Mediastinal adenopathy 05/17/2020   Past Medical History:  Diagnosis Date   Asthma    Cancer ([place])    GERD (gastroesophageal reflux disease)    Sleep apnea    CPAP- does not wear   Past Surgical History:  Procedure Laterality Date   BRONCHIAL NEEDLE ASPIRATION BIOPSY  05/22/2020   Procedure: BRONCHIAL NEEDLE ASPIRATION BIOPSIES;  Surgeon: Garner Nash, DO;  Location: [facility];  Service: Pulmonary;;   DIRECT LARYNGOSCOPY  05/09/2020   Procedure: DIRECT LARYNGOSCOPY;  Surgeon: Rozetta Nunnery, MD;  Location: [facility];  Service: ENT;;   IR GASTROSTOMY TUBE MOD SED  09/13/2020   IR GASTROSTOMY TUBE REMOVAL  12/14/2020   IR IMAGING GUIDED PORT INSERTION  06/12/2020   MASS EXCISION N/A 05/09/2020   Procedure: EXCISION TONGUE MASS;  Surgeon: Rozetta Nunnery, MD;  Location: [facility];  Service: ENT;  Laterality: N/A;   TONSILLECTOMY     TRACHEOSTOMY TUBE PLACEMENT  05/09/2020   Procedure:  TRACHEOSTOMY;  Surgeon: Rozetta Nunnery, MD;  Location: [facility];  Service: ENT;;   VIDEO BRONCHOSCOPY WITH ENDOBRONCHIAL ULTRASOUND N/A 05/22/2020   Procedure: VIDEO BRONCHOSCOPY WITH ENDOBRONCHIAL ULTRASOUND;  Surgeon: Garner Nash, DO;  Location: [facility];  Service: Pulmonary;  Laterality: N/A;   No Known Allergies Current Outpatient Medications  Medication Sig Dispense Refill   albuterol (PROVENTIL) (2.5 MG/3ML) 0.083% nebulizer solution Take 3 mLs (2.5 mg total) by nebulization every 6 (six) hours as needed for wheezing or shortness of breath. 120 mL 12   albuterol (VENTOLIN HFA) 108 (90 Base) MCG/ACT inhaler Inhale 2 puffs into the lungs as needed for wheezing or shortness of breath.     cetirizine (ZYRTEC) 10 MG tablet Take 10 mg by mouth daily.     fluticasone (FLONASE) 50 MCG/ACT nasal spray Place 2 sprays into both nostrils daily. Place 1 spray each nostril at night 16 g 11   Fluticasone-Salmeterol (ADVAIR) 100-50 MCG/DOSE AEPB Inhale 1 puff into the lungs daily.     omeprazole (PRILOSEC) 40 MG capsule Take 40 mg by mouth daily.     sodium fluoride (SODIUM FLUORIDE 5000 PPM) 1.1 % GEL dental gel Place pea-size drop into each tooth space of fluoride trays once a day at bedtime. Leave trays in mouth for 5 minutes, then remove and spit out excess fluoride. Do not rinse with water, eat or drink for at least 30 minutes after use. 120 mL 11   No current facility-administered medications for this visit.   Facility-Administered Medications Ordered in Other Visits  Medication Dose Route Frequency Provider Last Rate Last Admin   magnesium sulfate 2 GM/50ML IVPB             LABS: Lab Results  Component Value Date   WBC 5.5 01/11/2021   HGB 12.2 (L) 01/11/2021   HCT 35.9 (L) 01/11/2021   MCV 100.0 01/11/2021   PLT 149 (L) 01/11/2021      Component Value Date/Time   NA 141 01/11/2021 1000   K 3.9 01/11/2021 1000   CL 108 01/11/2021 1000   CO2 26 01/11/2021 1000   GLUCOSE  102 (H) 01/11/2021 1000   BUN 15 01/11/2021 1000   CREATININE 1.14 01/11/2021 1000   CREATININE 1.23 11/08/2020 0750   CALCIUM 9.4 01/11/2021 1000   GFRNONAA >60 01/11/2021 1000   GFRNONAA >60 11/08/2020 0750   Lab Results  Component Value Date   INR 1.0 09/13/2020   INR 1.3 (H) 05/27/2020   No results found for: PTT  Social History   Socioeconomic History   Marital status: Single    Spouse name: Not on file   Number of children: Not on file   Years of education: Not on file   Highest education level: Not on file  Occupational History   Not on file  Tobacco Use   Smoking status: Never   Smokeless tobacco: Never  Vaping Use   Vaping Use: Never used  Substance and Sexual Activity   Alcohol use: Not Currently    Comment: "4-5 drinks a year for special ocassions" 05/08/20   Drug use: Never   Sexual activity:  Not Currently  Other Topics Concern   Not on file  Social History Narrative   Not on file   Social Determinants of Health   Financial Resource Strain: Low Risk    Difficulty of Paying Living Expenses: Not hard at all  Food Insecurity: No Food Insecurity   Worried About [place] in the Last Year: Never true   [place] in the Last Year: Never true  Transportation Needs: No Transportation Needs   Lack of Transportation (Medical): No   Lack of Transportation (Non-Medical): No  Physical Activity: Not on file  Stress: No Stress Concern Present   Feeling of Stress : Not at all  Social Connections: Socially Isolated   Frequency of Communication with Friends and Family: More than three times a week   Frequency of Social Gatherings with Friends and Family: More than three times a week   Attends Religious Services: Never   Marine scientist or Organizations: No   Attends Archivist Meetings: Never   Marital Status: Separated  Intimate Partner Violence: Not on file   Family History  Problem Relation Age of Onset   Breast cancer  Mother    Cancer - Cervical Mother    Colon cancer Father    Colon cancer Brother      REVIEW OF SYSTEMS:  Reviewed with the patient as per HPI. Psych:  Patient denies having dental phobia.   ANTIBIOTIC PROPHYLAXIS OR OTHER PREMEDICATION: None indicated   VITAL SIGNS: BP 124/68 (BP Location: Right Arm, Patient Position: Sitting, Cuff Size: Normal)    Pulse 88    Temp 99.2 F (37.3 C) (Oral)    PHYSICAL EXAM:  Soft tissue exam completed and charted.  General:  Well-developed, comfortable and in no apparent distress. Neurological:  Alert and oriented to person, place and  time. Extraoral:  Head size, head shape, facial symmetry, skin, complexion, pigmentation, conjunctiva, oral labia, parotid gland, submandibular gland, thyroid, cervical and preauricular lymph nodes, submandibular and submental lymph nodes, tonsillar and occipital lymph nodes and supraclavicular lymph nodes WNL. No swelling or lymphadenopathy.  TMJ asymptomatic without clicks or crepitations.      Maximum Interincisal Opening:   45 mm (01/04/2021) 50 mm (08/30/2020) Intraoral:  Soft tissues appear well-perfused.  FOM and vestibules soft and not raised. Oral cavity without mass or lesion. No signs of infection, parulis, sinus tract, edema or erythema evident upon exam.  Bilateral mandibular tori. (+) Mucous membranes:  Thick, viscous saliva   DENTAL EXAM:  Hard tissue and periodontal exam completed and charted.       Overall impression:  Good remaining dentition. Oral hygiene:  Fair      HARD TISSUE:     Missing:  #3, #4, #13, #14, #15, #31, #32 Caries:  #43MO, #2DO, #41M, 12DO, #16O, #17O, #30MO, #20DO, #21DO, #28DO, #29MOD, #30MOD  Questionable prognosis:  #1, #16, #17, #20  Restorable:  #2, #11, #12, #21, #28, #29, #30  Incipient lesions:  #69M, #29D, #30D Defective restorations:  #19 PFM crown with possible open margin on distal; #20 missing restoration with recurrent decay Endodontics:  #19 existing  root canal treatment  Fixed prosthodontics:  #19 existing full-coverage PFM crown  Removable prosthodontics:  Patient denies wearing partial dentures. Occlusion:  Class 3 malocclusion   Non-functional teeth:  #1, #2, #18, #19, #29, #30  Supra-erupted teeth:  #1, #2  Other:  Drifting- #1, #2, #16 mesial, #28 distal;  Rotation- #11 Other:  (+) Attrition/wear:   #  7-#10 incisal, #22-#27 incisal (+) Abfraction/flexure: #5B(V), #12B(V), #20B(V), #21B(V), #28B(V), #29B(V), #30B(V) (+) Diastema(s): #8  #9, #10  #11, #26  #27, #27  #28      PERIODONTAL:    Probing depths:  Range between 1-6 mm; 6 mm localized to #1 mesio-lingual, #2 disto-lingual, #16 disto-lingual and #17 disto-buccal+lingual, direct buccal+lingual. Plaque:  Localized to posterior molars Calculus:  Generalized moderate accumulation Periodontally compromised teeth:  #1, #16, #17  Gingival appearance:  Pink, healthy gingival tissue with blunted papilla with areas of inflamed and erythematous tissue.   Exam performed by [facility] B. Benson Norway, D.M.D. with Leta Speller, DAII acting as scribe.   RADIOGRAPHIC EXAM:  PAN and Full Mouth Series exposed on 08/30/2020 interpreted.    Condyles seated bilaterally in fossas.  No evidence of abnormal pathology.  All visualized osseous structures appear WNL.  Missing teeth #'s 3, 4, 13, 14, 15, 31 & 32.  #1 & #2 appear supra-erupted & drifting mesial.    Localized mild horizontal bone loss consistent with mild periodontitis.  Radiographic calculus evident.   Existing restorations: #2, #5, #18 & #30; #19 existing full-coverage crown w/ radiopacity on distal margin.  #19 previously endodontically treated w/ post in distal root & gutta percha appearing ~1 mm short of apices.  Caries: #25MO, #2D, #61M, #12D, #16O, #17O, #21M, #20D, #21D, #28D, #65M & #64M.  Incipient lesions: #11M, #29D & #30D.   ASSESSMENT:  1.  History of base of tongue cancer 2.  History of head and neck radiation 3.   Comprehensive dental exam 4.  Missing teeth 5.  Caries 6.  Incipient lesions 7.  Localized chronic periodontitis 8.  Accretions on teeth 9.  Defective restorations 10.  Attrition/wear 11.  Abfraction/flexure 12.  Diastema 13.  Malocclusion 14.  Xerostomia due to radiotherapy 15.  Mandibular tori   PROCEDURES: Adult prophylaxis.  Removed all supra- and subgingival calculus and plaque using Cavitron and hand instruments. Polished all teeth.  Flossed. Oral hygiene instruction discussed with the patient.  Showed the patient areas with heavy plaque and calculus and increased probing depths.  Encouraged brushing hard to reach areas such as 3rd molars and flossing. Toothbrush, toothpaste and floss given to the patient.   PLAN/RECOMMENDATIONS: I discussed the risks, benefits, and complications of various scenarios with the patient in relationship to their medical and dental conditions.  I explained all significant findings of the dental consultation with the patient including multiple teeth with cavities, particularly on occlusal and interproximal surfaces and areas of his mouth with increased probing depths or inflamed and infected gingival tissue, and the recommended care including restorative, cleanings every 4 months with updated radiographs every 6 months and monitor areas of periodontal concern (> or = 5 mm probing depths) in order to optimize them from a dental standpoint.  The patient verbalized understanding of all findings, discussion, and recommendations. We then discussed various treatment options to include no treatment, multiple extractions with alveoloplasty, pre-prosthetic surgery as indicated, periodontal therapy, dental restorations, root canal therapy, crown and bridge therapy, implant therapy, and replacement of missing teeth as indicated.  We also discussed teeth with cavities that are third molars may be difficult to restore and treatment may have to be altered in the future.   We also may have to alter treatment plan to include scaling and root planing if probing depths do not improve or gingiva appears to be persistently inflamed in localized areas.  Premolars with large abfractions should also be restored due to compromised  tooth structure and for now we will continue to monitor crown on #19. The patient verbalized understanding of all options, and currently wishes to proceed with the treatment plan below:  Adult prophylaxis (completed today) Restorative (quadrant dentistry if time permits per visit) Recall interval: Prophy- 4 mos & monitor probing depths on molars, Bitewings - 6 mos (need to update at next visit)  Plan to discuss all findings and recommendations with medical team.  Rx: Sodium fluoride prescribed at last visit- no refills needed at this time. Next visit:  Upper right quadrant restorative per treatment plan   All questions and concerns were invited and addressed.  The patient tolerated today's visit well and departed in stable condition.  I spent in excess of 90 minutes during the conduct of this consultation and >50% of this time involved direct face-to-face encounter for counseling and/or coordination of the patient's care.      Charlaine Dalton, D.M.D.

## 2021-03-06 NOTE — Therapy (Signed)
[facility] @ [place], [place], [address] Phone: [phone]   Fax:  [phone]  Physical Therapy Treatment  Patient Details  Name: Alexander [name] MRN: [id number] Date of Birth: [birth year] Referring Provider (PT): Alvy Bimler   Encounter Date: 03/06/2021   PT End of Session - 03/06/21 1656     Visit Number 2    Number of Visits 9    Date for PT Re-Evaluation 03/20/21    PT Start Time 6073    PT Stop Time 7106    PT Time Calculation (min) 57 min    Activity Tolerance Patient tolerated treatment well    Behavior During Therapy [facility] for tasks assessed/performed             Past Medical History:  Diagnosis Date   Asthma    Cancer ([place])    GERD (gastroesophageal reflux disease)    Sleep apnea    CPAP- does not wear    Past Surgical History:  Procedure Laterality Date   BRONCHIAL NEEDLE ASPIRATION BIOPSY  05/22/2020   Procedure: BRONCHIAL NEEDLE ASPIRATION BIOPSIES;  Surgeon: Garner Nash, DO;  Location: [facility];  Service: Pulmonary;;   DIRECT LARYNGOSCOPY  05/09/2020   Procedure: DIRECT LARYNGOSCOPY;  Surgeon: Rozetta Nunnery, MD;  Location: [facility];  Service: ENT;;   IR GASTROSTOMY TUBE MOD SED  09/13/2020   IR GASTROSTOMY TUBE REMOVAL  12/14/2020   IR IMAGING GUIDED PORT INSERTION  06/12/2020   MASS EXCISION N/A 05/09/2020   Procedure: EXCISION TONGUE MASS;  Surgeon: Rozetta Nunnery, MD;  Location: [facility];  Service: ENT;  Laterality: N/A;   TONSILLECTOMY     TRACHEOSTOMY TUBE PLACEMENT  05/09/2020   Procedure: TRACHEOSTOMY;  Surgeon: Rozetta Nunnery, MD;  Location: [facility];  Service: ENT;;   VIDEO BRONCHOSCOPY WITH ENDOBRONCHIAL ULTRASOUND N/A 05/22/2020   Procedure: VIDEO BRONCHOSCOPY WITH ENDOBRONCHIAL ULTRASOUND;  Surgeon: Garner Nash, DO;  Location: [facility];  Service: Pulmonary;  Laterality: N/A;    There were no vitals filed for this visit.   Subjective Assessment -  03/06/21 1556     Subjective The chip pack is loose now. I think it helped some. I think my swelling is down some and I think it may be softer.    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 That portion of the mass had poorly defined margins but appeared to cross the midline and was estimated to measure at least 5.0 x 4.7 x 3.4 cm. The epiglottis and aryeglottic folds were not well separately delineated and may have been involved by mass. Tumor extension was suspected inferiorly at least to the level of the supraglottic larynx, 05/11/20 CT chest showed subcarinal mass/adenopathy and low lung volumes with bibasilar atelectasis. However, there was no acute intrathoracic pathology, 05/03/20 He saw Dr. [name] who performed a fiberoptic laryngoscopy through the right nostril. Nasopharyngeal was clear, but the base of the tongue was full on the left side and epiglottis was slightly pushed back posteriorly with questionable ulcer on the left base of the tongue extending down to the left vallecular area, biopsy of the base of the tongue on 05/09/2020  revealed: invasive squamous cell carcinoma.  Strongly p16 positive; emergent tracheostomy was also performed at that time, received induction chemotherapy of 3 cycles of Carbo/5 FU, will now receive 35 fractions of radiation to his Base of tongue and bilateral neck and 7 weeks of cisplatin chemotherapy. He will also receive radiation to his subcarinal node in his chest area. He started on 09/19/20 and will complete 11/07/20, PAC 06/12/20, PEG 09/13/20, Tracheostomy placed 05/09/20 and decannulated 08/31/20.    Patient Stated Goals to limit the accumulation of lymphatic  fluid in my neck    Currently in Pain? No/denies    Pain Score 0-No pain                               [facility] Adult PT Treatment/Exercise - 03/06/21 0001       Manual Therapy   Manual Lymphatic Drainage (MLD) seated in front of mirror had pt return demonstrate entire sequence with correct technique and skin stretch with moderate verbal and tactile cues from therapist - pt completed first half then therapist completed second half: short neck, 5 diaphragmatic breaths, bilateral axillary nodes, bilateral pectoral nodes, bilateral chest, short neck, posterior, lateral and anterior neck then retracing all steps                          PT Long Term Goals - 02/20/21 1647       PT LONG TERM GOAL #1   Title Pt will be independent in self MLD for long term management of lymphedema.    Time 4    Period Weeks    Status New    Target Date 03/20/21      PT LONG TERM GOAL #2   Title Pt will obtain appropriate compression garment for long term management of lymphedema.    Time 4    Period Weeks    Status New    Target Date 03/20/21      PT LONG TERM GOAL #3   Title Pt will report a 50% decrease in edema at anterior neck to decrease risk of infection.    Time 4    Period Weeks    Status New    Target Date 03/20/21                   Plan - 03/06/21 1657     Clinical Impression Statement Instructed pt in self MLD using [facility] anterior approach handout. Issued handout and had pt return demonstrate entire sequence demonstrating correct skin stretch and correct direction of stretch. Then pt performed MLD to pt after instruction. Issued pt info about FlexiTouch head and neck compression pump which pt was interested in. Printed off info for [employer]Pak head and neck garment so pt can contact his insurance and see if it is covered. Will assess indep with self MLD at next session.    PT Frequency 2x / week    PT Duration 4 weeks    PT  Treatment/Interventions ADLs/Self Care Home Management;Patient/family education;Therapeutic exercise;Manual techniques;Manual lymph drainage;Orthotic Fit/Training;Compression bandaging;Taping;Vasopneumatic Device    PT Next Visit Plan cont instruct in self MLD and have pt return demonstrate, eventually get compression garment, did we hear back from [employer]?    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[facility])     Problem List Patient Active Problem List   Diagnosis Date Noted   Acquired lymphedema 01/11/2021   History of head and neck radiation 01/07/2021   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   Dysgeusia 01/07/2021   Accretions on teeth 01/07/2021   Pancytopenia, acquired ([place]) 12/07/2020   Dry mouth 12/07/2020   Leukopenia due to antineoplastic chemotherapy ([place]) 11/08/2020   Port-A-Cath in place 10/03/2020   Intractable hiccups 10/03/2020   Thrombocytopenia ([place]) 10/03/2020   Chemotherapy-induced fatigue 09/27/2020   Weight loss, unintentional 06/01/2020   Malnutrition of moderate degree 05/28/2020   GERD (gastroesophageal reflux disease)    Volume depletion    Hyperbilirubinemia    Hyponatremia    Mild protein-calorie malnutrition ([place])    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

## 2021-03-08 ENCOUNTER — Inpatient Hospital Stay (HOSPITAL_BASED_OUTPATIENT_CLINIC_OR_DEPARTMENT_OTHER): Payer: PRIVATE HEALTH INSURANCE | Admitting: Hematology and Oncology

## 2021-03-08 ENCOUNTER — Other Ambulatory Visit: Payer: Self-pay

## 2021-03-08 ENCOUNTER — Ambulatory Visit: Payer: BC Managed Care – PPO | Admitting: Hematology and Oncology

## 2021-03-08 ENCOUNTER — Encounter: Payer: Self-pay | Admitting: Hematology and Oncology

## 2021-03-08 ENCOUNTER — Other Ambulatory Visit: Payer: BC Managed Care – PPO

## 2021-03-08 ENCOUNTER — Inpatient Hospital Stay: Payer: PRIVATE HEALTH INSURANCE | Attending: Hematology and Oncology

## 2021-03-08 DIAGNOSIS — M264 Malocclusion, unspecified: Secondary | ICD-10-CM | POA: Insufficient documentation

## 2021-03-08 DIAGNOSIS — C01 Malignant neoplasm of base of tongue: Secondary | ICD-10-CM | POA: Insufficient documentation

## 2021-03-08 DIAGNOSIS — D701 Agranulocytosis secondary to cancer chemotherapy: Secondary | ICD-10-CM

## 2021-03-08 DIAGNOSIS — K029 Dental caries, unspecified: Secondary | ICD-10-CM | POA: Insufficient documentation

## 2021-03-08 DIAGNOSIS — T451X5A Adverse effect of antineoplastic and immunosuppressive drugs, initial encounter: Secondary | ICD-10-CM | POA: Diagnosis not present

## 2021-03-08 DIAGNOSIS — D696 Thrombocytopenia, unspecified: Secondary | ICD-10-CM | POA: Insufficient documentation

## 2021-03-08 DIAGNOSIS — K056 Periodontal disease, unspecified: Secondary | ICD-10-CM | POA: Insufficient documentation

## 2021-03-08 DIAGNOSIS — K08109 Complete loss of teeth, unspecified cause, unspecified class: Secondary | ICD-10-CM | POA: Insufficient documentation

## 2021-03-08 DIAGNOSIS — K0850 Unsatisfactory restoration of tooth, unspecified: Secondary | ICD-10-CM | POA: Insufficient documentation

## 2021-03-08 DIAGNOSIS — R918 Other nonspecific abnormal finding of lung field: Secondary | ICD-10-CM | POA: Diagnosis not present

## 2021-03-08 DIAGNOSIS — M270 Developmental disorders of jaws: Secondary | ICD-10-CM | POA: Insufficient documentation

## 2021-03-08 DIAGNOSIS — M2632 Excessive spacing of fully erupted teeth: Secondary | ICD-10-CM | POA: Insufficient documentation

## 2021-03-08 DIAGNOSIS — Z0120 Encounter for dental examination and cleaning without abnormal findings: Secondary | ICD-10-CM | POA: Insufficient documentation

## 2021-03-08 DIAGNOSIS — R634 Abnormal weight loss: Secondary | ICD-10-CM | POA: Insufficient documentation

## 2021-03-08 DIAGNOSIS — K032 Erosion of teeth: Secondary | ICD-10-CM | POA: Insufficient documentation

## 2021-03-08 DIAGNOSIS — K031 Abrasion of teeth: Secondary | ICD-10-CM | POA: Insufficient documentation

## 2021-03-08 DIAGNOSIS — Z95828 Presence of other vascular implants and grafts: Secondary | ICD-10-CM

## 2021-03-08 DIAGNOSIS — K030 Excessive attrition of teeth: Secondary | ICD-10-CM | POA: Insufficient documentation

## 2021-03-08 LAB — BASIC METABOLIC PANEL - CANCER CENTER ONLY
Anion gap: 6 (ref 5–15)
BUN: 17 mg/dL (ref 6–20)
CO2: 29 mmol/L (ref 22–32)
Calcium: 9.3 mg/dL (ref 8.9–10.3)
Chloride: 106 mmol/L (ref 98–111)
Creatinine: 1.24 mg/dL (ref 0.61–1.24)
GFR, Estimated: >60 mL/min (ref >60)
Glucose, Bld: 95 mg/dL (ref 70–99)
Potassium: 3.9 mmol/L (ref 3.5–5.1)
Sodium: 141 mmol/L (ref 135–145)

## 2021-03-08 LAB — CBC WITH DIFFERENTIAL (CANCER CENTER ONLY)
Abs Immature Granulocytes: 0 10*3/uL (ref 0.00–0.07)
Basophils Absolute: 0 10*3/uL (ref 0.0–0.1)
Basophils Relative: 0 %
Eosinophils Absolute: 0.1 10*3/uL (ref 0.0–0.5)
Eosinophils Relative: 2 %
HCT: 37.3 % — ABNORMAL LOW (ref 39.0–52.0)
Hemoglobin: 12.2 g/dL — ABNORMAL LOW (ref 13.0–17.0)
Immature Granulocytes: 0 %
Lymphocytes Relative: 14 %
Lymphs Abs: 0.5 10*3/uL — ABNORMAL LOW (ref 0.7–4.0)
MCH: 31 pg (ref 26.0–34.0)
MCHC: 32.7 g/dL (ref 30.0–36.0)
MCV: 94.9 fL (ref 80.0–100.0)
Monocytes Absolute: 0.4 10*3/uL (ref 0.1–1.0)
Monocytes Relative: 11 %
Neutro Abs: 2.7 10*3/uL (ref 1.7–7.7)
Neutrophils Relative %: 73 %
Platelet Count: 130 10*3/uL — ABNORMAL LOW (ref 150–400)
RBC: 3.93 MIL/uL — ABNORMAL LOW (ref 4.22–5.81)
RDW: 12.8 % (ref 11.5–15.5)
WBC Count: 3.6 10*3/uL — ABNORMAL LOW (ref 4.0–10.5)
nRBC: 0 % (ref 0.0–0.2)

## 2021-03-08 LAB — MAGNESIUM: Magnesium: 2 mg/dL (ref 1.7–2.4)

## 2021-03-08 MED ORDER — SODIUM CHLORIDE 0.9% FLUSH
10.0000 mL | Freq: Once | INTRAVENOUS | Status: AC
  Administered 2021-03-08: 10 mL

## 2021-03-08 MED ORDER — HEPARIN SOD (PORK) LOCK FLUSH 100 UNIT/ML IV SOLN
500.0000 [IU] | Freq: Once | INTRAVENOUS | Status: AC
  Administered 2021-03-08: 500 [IU]

## 2021-03-08 NOTE — Assessment & Plan Note (Signed)
He CBC from today shows ongoing leukopenia and mild thrombocytopenia.  Again he does not need any medications or intervention at this time.  We will repeat CBC when he returns to clinic for follow-up in 3 months.

## 2021-03-08 NOTE — Progress Notes (Signed)
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 He is clinically asymptomatic, no concerns for an active pneumonia. No physical examination findings suggestive of residual disease. Will repeat PET/CT in about 3 to 4 months and he will return to clinic for follow-up.  Leukopenia due to antineoplastic chemotherapy ([place]) He CBC from today shows ongoing leukopenia and mild thrombocytopenia.  Again he does not need any medications or intervention at this time.  We will repeat CBC when he returns to clinic for follow-up in 3 months.  Weight loss, unintentional He lost about 6 pounds since his last visit.  He says his weight is fluctuant.  He has been eating well, able to swallow most of the foods although he has to avoid certain breads and spicy foods.  We will continue to monitor this since he is feeling healthy at this time.  No orders of the defined types were placed in this encounter.   All questions were answered. The patient knows to call the clinic with any problems, questions or concerns. The total time spent in the appointment was 30 minutes encounter with patients including review of chart and various tests results, discussions about plan of care and coordination of care plan   Benay Pike, MD 03/08/2021 9:17 AM  INTERVAL HISTORY:  Please see below for problem oriented charting.  He  returns for surveillance follow-up after completion of chemotherapy and radiation for squamous cell carcinoma of the tongue Since last time I saw him, he has lost some weight, about 6 lbs since his last visit He is able to swallow ok except for certain foods. Complains of dry mouth.He can taste most things but not all. He is working with PT, doing his neck massages. Hearing is fine. Mild tingling in feet, doesn't limit from doing anything No change in breathing, bowel habits or urinary habits. Rest of the ROS reviewed and negative  All other systems were reviewed with the patient and are negative.  I have reviewed the past medical history, past surgical history, social history and family history with the patient and they are unchanged from previous note.  ALLERGIES:  has No Known Allergies.  MEDICATIONS:  Current Outpatient Medications  Medication Sig Dispense Refill   albuterol (PROVENTIL) (2.5 MG/3ML) 0.083% nebulizer solution Take 3 mLs (2.5 mg total) by nebulization every 6 (six) hours as needed for wheezing or shortness of breath. 120 mL 12   albuterol (VENTOLIN HFA) 108 (90 Base) MCG/ACT inhaler Inhale 2 puffs into the lungs as needed for wheezing or shortness of breath.     cetirizine (ZYRTEC) 10 MG tablet Take 10 mg by mouth daily.     fluticasone (FLONASE) 50 MCG/ACT nasal spray Place 2 sprays into both nostrils daily. Place 1 spray each nostril at night 16 g 11   Fluticasone-Salmeterol (ADVAIR) 100-50 MCG/DOSE AEPB Inhale 1 puff into the lungs daily.     omeprazole (PRILOSEC) 40 MG capsule Take 40 mg by mouth daily.  sodium fluoride (SODIUM FLUORIDE 5000 PPM) 1.1 % GEL dental gel Place pea-size drop into each tooth space of fluoride trays once a day at bedtime. Leave trays in mouth for 5 minutes, then remove and spit out excess fluoride. Do not rinse with water, eat or drink for at least 30 minutes after use. 120 mL 11   No current facility-administered medications for this  visit.   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[place])   05/18/2020 Cancer Staging   Staging form: Pharynx - HPV-Mediated Oropharynx, AJCC 8th Edition - Clinical stage from 05/18/2020: Stage IV (cT4, cN3, cM1, p16+) - Signed by Eppie Gibson, MD on 12/10/2020    06/01/2020 Cancer Staging   Staging form: Pharynx - HPV-Mediated Oropharynx, AJCC 8th Edition - Pathologic stage from 06/01/2020: No Stage Recommended (cT4, cN3, cM1, p16+) - Signed by Benay Pike, MD on 06/01/2020 Stage prefix: Initial diagnosis    06/12/2020 - 08/21/2020 Chemotherapy          09/10/2020 - 09/10/2020 Chemotherapy          09/19/2020 - 11/09/2020 Chemotherapy   Patient is on Treatment Plan : HEAD/NECK Cisplatin q7d       PHYSICAL EXAMINATION: ECOG PERFORMANCE STATUS: 1 - Symptomatic but completely ambulatory  Vitals:   03/08/21 0833  BP: 99/66  Pulse: 86  Resp: 18  Temp: (!) 97.5 F (36.4 C)  SpO2: 99%   Filed Weights   03/08/21 0833  Weight: 240 lb 6.4 oz (109 kg)    Physical Exam Constitutional:      Appearance: Normal appearance.  HENT:     Head: Normocephalic and atraumatic.  Eyes:     Pupils: Pupils are equal, round, and reactive to light.  Cardiovascular:     Rate and Rhythm: Normal rate and regular rhythm.     Pulses: Normal pulses.     Heart sounds: Normal heart sounds.  Pulmonary:     Effort: Pulmonary effort is normal.     Breath sounds: Normal breath sounds.  Abdominal:     General: Abdomen is flat. Bowel sounds are normal.  Musculoskeletal:        General: Normal range of motion.     Cervical back: Normal range of motion and neck supple. No rigidity.  Lymphadenopathy:      Cervical: No cervical adenopathy.  Skin:    General: Skin is warm and dry.  Neurological:     General: No focal deficit present.     Mental Status: He is alert.     LABORATORY DATA:  I have reviewed the data as listed    Component Value Date/Time   NA 141 03/08/2021 0800   K 3.9 03/08/2021 0800   CL 106 03/08/2021 0800   CO2 29 03/08/2021 0800   GLUCOSE 95 03/08/2021 0800   BUN 17 03/08/2021 0800   CREATININE 1.24 03/08/2021 0800   CALCIUM 9.3 03/08/2021 0800   PROT 6.8 01/11/2021 1000   ALBUMIN 4.0 01/11/2021 1000   AST 12 (L) 01/11/2021 1000   AST 17 09/19/2020 0826   ALT 9 01/11/2021 1000   ALT 15 09/19/2020 0826   ALKPHOS 46 01/11/2021 1000   BILITOT  1.0 01/11/2021 1000   BILITOT 0.6 09/19/2020 0826   GFRNONAA >60 03/08/2021 0800    No results found for: SPEP, UPEP  Lab Results  Component Value Date   WBC 3.6 (L) 03/08/2021   NEUTROABS 2.7 03/08/2021   HGB 12.2 (L) 03/08/2021   HCT 37.3 (L) 03/08/2021   MCV 94.9 03/08/2021   PLT 130 (L) 03/08/2021      Chemistry      Component Value Date/Time   NA 141 03/08/2021 0800   K 3.9 03/08/2021 0800   CL 106 03/08/2021 0800   CO2 29 03/08/2021 0800   BUN 17 03/08/2021 0800   CREATININE 1.24 03/08/2021 0800      Component Value Date/Time   CALCIUM 9.3 03/08/2021 0800   ALKPHOS 46 01/11/2021 1000   AST 12 (L) 01/11/2021 1000   AST 17 09/19/2020 0826   ALT 9 01/11/2021 1000   ALT 15 09/19/2020 0826   BILITOT 1.0 01/11/2021 1000   BILITOT 0.6 09/19/2020 0826     PET CT  IMPRESSION: 1. Improved findings when compared to prior PET-CT suggesting a good response to treatment. Small focus of residual hypermetabolism in the anterior aspect of the floor the mouth and small residual slightly hypermetabolic level 2 lymph nodes. Recommend continued surveillance. 2. No findings for metastatic disease involving the chest, abdomen or pelvis. 3. Small patchy infiltrates in the right lung.  I spent 30 minutes in  the care of this patient including history, review of records, physical exam, counseling and coordination of care.  Benay Pike MD

## 2021-03-08 NOTE — Assessment & Plan Note (Signed)
He lost about 6 pounds since his last visit.  He says his weight is fluctuant.  He has been eating well, able to swallow most of the foods although he has to avoid certain breads and spicy foods.  We will continue to monitor this since he is feeling healthy at this time.

## 2021-03-08 NOTE — Assessment & Plan Note (Signed)
His most recent PET/CT was reviewed, no active concerns for residual disease although there is some mild hypermetabolic activity in the floor of the mouth and some cervical lymph nodes and there is a right lung infiltrate. He is clinically asymptomatic, no concerns for an active pneumonia. No physical examination findings suggestive of residual disease. Will repeat PET/CT in about 3 to 4 months and he will return to clinic for follow-up.

## 2021-03-11 ENCOUNTER — Ambulatory Visit: Payer: PRIVATE HEALTH INSURANCE | Admitting: Rehabilitation

## 2021-03-11 ENCOUNTER — Other Ambulatory Visit: Payer: Self-pay

## 2021-03-11 ENCOUNTER — Encounter: Payer: Self-pay | Admitting: Rehabilitation

## 2021-03-11 DIAGNOSIS — R293 Abnormal posture: Secondary | ICD-10-CM

## 2021-03-11 DIAGNOSIS — R1313 Dysphagia, pharyngeal phase: Secondary | ICD-10-CM | POA: Diagnosis not present

## 2021-03-11 DIAGNOSIS — C109 Malignant neoplasm of oropharynx, unspecified: Secondary | ICD-10-CM

## 2021-03-11 DIAGNOSIS — I890 Lymphedema, not elsewhere classified: Secondary | ICD-10-CM

## 2021-03-11 NOTE — Therapy (Signed)
[facility] @ [address] [place], [place], [address] Phone: [phone]   Fax:  [phone]  Physical Therapy Treatment  Patient Details  Name: Alexander [name] MRN: [id number] Date of Birth: 1960/05/26 Referring Provider (PT): Alvy Bimler   Encounter Date: 03/11/2021   PT End of Session - 03/11/21 1048     Visit Number 3    Number of Visits 9    Date for PT Re-Evaluation 03/20/21    PT Start Time 1000    PT Stop Time 1046    PT Time Calculation (min) 46 min    Activity Tolerance Patient tolerated treatment well    Behavior During Therapy [facility] for tasks assessed/performed             Past Medical History:  Diagnosis Date   Asthma    Cancer ([place])    GERD (gastroesophageal reflux disease)    Sleep apnea    CPAP- does not wear    Past Surgical History:  Procedure Laterality Date   BRONCHIAL NEEDLE ASPIRATION BIOPSY  05/22/2020   Procedure: BRONCHIAL NEEDLE ASPIRATION BIOPSIES;  Surgeon: Garner Nash, DO;  Location: [facility];  Service: Pulmonary;;   DIRECT LARYNGOSCOPY  05/09/2020   Procedure: DIRECT LARYNGOSCOPY;  Surgeon: Rozetta Nunnery, MD;  Location: [facility];  Service: ENT;;   IR GASTROSTOMY TUBE MOD SED  09/13/2020   IR GASTROSTOMY TUBE REMOVAL  12/14/2020   IR IMAGING GUIDED PORT INSERTION  06/12/2020   MASS EXCISION N/A 05/09/2020   Procedure: EXCISION TONGUE MASS;  Surgeon: Rozetta Nunnery, MD;  Location: [facility];  Service: ENT;  Laterality: N/A;   TONSILLECTOMY     TRACHEOSTOMY TUBE PLACEMENT  05/09/2020   Procedure: TRACHEOSTOMY;  Surgeon: Rozetta Nunnery, MD;  Location: [facility];  Service: ENT;;   VIDEO BRONCHOSCOPY WITH ENDOBRONCHIAL ULTRASOUND N/A 05/22/2020   Procedure: VIDEO BRONCHOSCOPY WITH ENDOBRONCHIAL ULTRASOUND;  Surgeon: Garner Nash, DO;  Location: [facility];  Service: Pulmonary;  Laterality: N/A;    There were no vitals filed for this visit.   Subjective Assessment -  03/11/21 1002     Subjective Nothing new. My self massage is not easy on my own.  I think the pump will really help.    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 That portion of the mass had poorly defined margins but appeared to cross the midline and was estimated to measure at least 5.0 x 4.7 x 3.4 cm. The epiglottis and aryeglottic folds were not well separately delineated and may have been involved by mass. Tumor extension was suspected inferiorly at least to the level of the supraglottic larynx, 05/11/20 CT chest showed subcarinal mass/adenopathy and low lung volumes with bibasilar atelectasis. However, there was no acute intrathoracic pathology, 05/03/20 He saw Dr. [name] who performed a fiberoptic laryngoscopy through the right nostril. Nasopharyngeal was clear, but the base of the tongue was full on the left side and epiglottis was slightly pushed back posteriorly with questionable ulcer on the left base of the tongue extending down to the left vallecular area, biopsy of the base of the tongue on 05/09/2020 revealed: invasive squamous cell carcinoma.  Strongly p16 positive; emergent tracheostomy was also performed at that time, received induction chemotherapy of 3 cycles of Carbo/5 FU, will now receive 35 fractions of radiation to his Base of tongue and bilateral neck and 7 weeks of cisplatin chemotherapy. He will also receive radiation to his subcarinal node in his chest area. He started on 09/19/20 and will complete 11/07/20, PAC 06/12/20, PEG 09/13/20, Tracheostomy placed 05/09/20 and decannulated 08/31/20.    Currently in Pain? No/denies                               [facility]  Adult PT Treatment/Exercise - 03/11/21 0001       Manual Therapy   Manual therapy comments emailed [employer] today regarding which company coverage would be through.  See email responses if pt has not heard from them by phone next visit.    Manual Lymphatic Drainage (MLD) seated in front of mirror had pt return demonstrate entire sequence with correct technique and skin stretch with only minimal to no verbal cues and no tcs from therapist - pt completed first half then therapist completed second half: short neck, 5 diaphragmatic breaths, bilateral axillary nodes, bilateral pectoral nodes, bilateral chest, short neck, posterior, lateral and anterior neck then retracing all steps                          PT Long Term Goals - 02/20/21 1647       PT LONG TERM GOAL #1   Title Pt will be independent in self MLD for long term management of lymphedema.    Time 4    Period Weeks    Status New    Target Date 03/20/21      PT LONG TERM GOAL #2   Title Pt will obtain appropriate compression garment for long term management of lymphedema.    Time 4    Period Weeks    Status New    Target Date 03/20/21      PT LONG TERM GOAL #3   Title Pt will report a 50% decrease in edema at anterior neck to decrease risk of infection.    Time 4    Period Weeks    Status New    Target Date 03/20/21                   Plan - 03/11/21 1048     Clinical Impression Statement Pt is doing very well with Self MLD but is excited to be able to get a pump to assist.  Vcs for less skin sliding but overall very good.  Continued PT performed MLD.    PT Frequency 2x / week    PT Duration 4 weeks    PT Treatment/Interventions ADLs/Self Care Home Management;Patient/family education;Therapeutic exercise;Manual techniques;Manual lymph drainage;Orthotic Fit/Training;Compression bandaging;Taping;Vasopneumatic Device    PT Next Visit Plan hear from [employer]? If not let pt know answer from email  regarding which company is covering the [place].  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[place])     Problem  List Patient Active Problem List   Diagnosis Date Noted   Encounter for dental examination and cleaning without abnormal findings 03/08/2021   Teeth missing 03/08/2021   Caries 03/08/2021   Defective dental restoration 03/08/2021   Excessive dental attrition 03/08/2021   Abfraction 03/08/2021   Diastema 03/08/2021   Malocclusion 03/08/2021   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   Incipient enamel caries 03/08/2021   Chronic periodontal disease 03/08/2021   Acquired lymphedema 01/11/2021   History of head and neck radiation 01/07/2021   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   Dysgeusia 01/07/2021   Accretions on teeth 01/07/2021   Pancytopenia, acquired ([place]) 12/07/2020   Dry mouth 12/07/2020   Leukopenia due to antineoplastic chemotherapy ([place]) 11/08/2020   Port-A-Cath in place 10/03/2020   Intractable hiccups 10/03/2020   Thrombocytopenia ([place]) 10/03/2020   Chemotherapy-induced fatigue 09/27/2020   Weight loss, unintentional 06/01/2020   Malnutrition of moderate degree 05/28/2020   GERD (gastroesophageal reflux disease)    Volume depletion    Hyperbilirubinemia    Hyponatremia    Mild protein-calorie malnutrition ([place])    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

## 2021-03-12 ENCOUNTER — Inpatient Hospital Stay: Payer: PRIVATE HEALTH INSURANCE | Admitting: Dietician

## 2021-03-12 ENCOUNTER — Telehealth: Payer: Self-pay | Admitting: Dietician

## 2021-03-12 NOTE — Telephone Encounter (Signed)
Nutrition Follow-up:  Patient completed concurrent chemoradiation for tongue cancer. PEG removed 10/28.  Patient reports he has a good appetite and overall eating a regular diet. He continues to have problems eating bread, donuts and cakes. They "ball up and stick roof of his mouth." Taste has improved to a degree. This is not something he focuses on. Patient complains of dry mouth. He is using biotene dry mouth rinse, this works for a short amount of time. He has tried mints. Patient is drinking a lot of water, he "has no other choice." He continues working with PT and SLP.   Medications: reviewed   Labs: 1/20 labs reviewed   Anthropometrics: Weights are fluctuating ~ 5 lbs per pt. Last weight 240 lb 6.4 oz on 1/20 decreased 2.4% in 3 weeks; insignificant   Weights have decreased 8% (21 lbs) s/p PEG removal on 10/28; significant  1/03 - 246 lb 11/25 - 249 lb 1.6 oz  10/21 - 261 lb 6.4 oz   NUTRITION DIAGNOSIS: Inadequate oral intake improving     INTERVENTION:  Encouraged high calorie, high protein foods for weight maintenance Provided encouragement and support Patient has contact information    MONITORING, EVALUATION, GOAL: weight trends, intake    NEXT VISIT: No follow-up scheduled at this time. Patient encouraged to contact with nutrition questions/concerns

## 2021-03-13 ENCOUNTER — Ambulatory Visit: Payer: PRIVATE HEALTH INSURANCE | Admitting: Physical Therapy

## 2021-03-13 ENCOUNTER — Other Ambulatory Visit: Payer: Self-pay

## 2021-03-13 ENCOUNTER — Encounter: Payer: Self-pay | Admitting: Physical Therapy

## 2021-03-13 DIAGNOSIS — I890 Lymphedema, not elsewhere classified: Secondary | ICD-10-CM

## 2021-03-13 DIAGNOSIS — C109 Malignant neoplasm of oropharynx, unspecified: Secondary | ICD-10-CM

## 2021-03-13 DIAGNOSIS — R1313 Dysphagia, pharyngeal phase: Secondary | ICD-10-CM | POA: Diagnosis not present

## 2021-03-13 DIAGNOSIS — R293 Abnormal posture: Secondary | ICD-10-CM

## 2021-03-13 NOTE — Therapy (Signed)
[facility] @ [place], [place], [address] Phone: [phone]   Fax:  [phone]  Physical Therapy Treatment  Patient Details  Name: Alexander [name] MRN: [id number] Date of Birth: September 09, 1960 Referring Provider (PT): Alvy Bimler   Encounter Date: 03/13/2021   PT End of Session - 03/13/21 1713     Visit Number 4    Number of Visits 9    Date for PT Re-Evaluation 03/20/21    PT Start Time 1503    PT Stop Time 0737    PT Time Calculation (min) 52 min    Activity Tolerance Patient tolerated treatment well    Behavior During Therapy [facility] for tasks assessed/performed             Past Medical History:  Diagnosis Date   Asthma    Cancer ([place])    GERD (gastroesophageal reflux disease)    Sleep apnea    CPAP- does not wear    Past Surgical History:  Procedure Laterality Date   BRONCHIAL NEEDLE ASPIRATION BIOPSY  05/22/2020   Procedure: BRONCHIAL NEEDLE ASPIRATION BIOPSIES;  Surgeon: Garner Nash, DO;  Location: [place];  Service: Pulmonary;;   DIRECT LARYNGOSCOPY  05/09/2020   Procedure: DIRECT LARYNGOSCOPY;  Surgeon: Rozetta Nunnery, MD;  Location: [place];  Service: ENT;;   IR GASTROSTOMY TUBE MOD SED  09/13/2020   IR GASTROSTOMY TUBE REMOVAL  12/14/2020   IR IMAGING GUIDED PORT INSERTION  06/12/2020   MASS EXCISION N/A 05/09/2020   Procedure: EXCISION TONGUE MASS;  Surgeon: Rozetta Nunnery, MD;  Location: [place];  Service: ENT;  Laterality: N/A;   TONSILLECTOMY     TRACHEOSTOMY TUBE PLACEMENT  05/09/2020   Procedure: TRACHEOSTOMY;  Surgeon: Rozetta Nunnery, MD;  Location: [place];  Service: ENT;;   VIDEO BRONCHOSCOPY WITH ENDOBRONCHIAL ULTRASOUND N/A 05/22/2020   Procedure: VIDEO BRONCHOSCOPY WITH ENDOBRONCHIAL ULTRASOUND;  Surgeon: Garner Nash, DO;  Location: [place];  Service: Pulmonary;  Laterality: N/A;    There were no vitals filed for this visit.   Subjective Assessment -  03/13/21 1505     Subjective I am waiting to hear from the compression company to see if it will be covered in full.    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 That portion of the mass had poorly defined margins but appeared to cross the midline and was estimated to measure at least 5.0 x 4.7 x 3.4 cm. The epiglottis and aryeglottic folds were not well separately delineated and may have been involved by mass. Tumor extension was suspected inferiorly at least to the level of the supraglottic larynx, 05/11/20 CT chest showed subcarinal mass/adenopathy and low lung volumes with bibasilar atelectasis. However, there was no acute intrathoracic pathology, 05/03/20 He saw Dr. [name] who performed a fiberoptic laryngoscopy through the right nostril. Nasopharyngeal was clear, but the base of the tongue was full on the left side and epiglottis was slightly pushed back posteriorly with questionable ulcer on the left base of the tongue extending down to the left vallecular area, biopsy of the base of the tongue on 05/09/2020 revealed: invasive squamous cell carcinoma.  Strongly  p16 positive; emergent tracheostomy was also performed at that time, received induction chemotherapy of 3 cycles of Carbo/5 FU, will now receive 35 fractions of radiation to his Base of tongue and bilateral neck and 7 weeks of cisplatin chemotherapy. He will also receive radiation to his subcarinal node in his chest area. He started on 09/19/20 and will complete 11/07/20, PAC 06/12/20, PEG 09/13/20, Tracheostomy placed 05/09/20 and decannulated 08/31/20.    Patient Stated Goals to limit the accumulation of lymphatic fluid in my neck    Currently  in Pain? No/denies    Pain Score 0-No pain                               [facility] Adult PT Treatment/Exercise - 03/13/21 0001       Manual Therapy   Manual therapy comments [employer] contacted pt regarding coverage    Manual Lymphatic Drainage (MLD) seated in front of mirror had pt return demonstrate entire sequence with correct technique and skin stretch with only minimal to no verbal cues and occasional tactile cues from therapist for correct skin stretch technique - pt completed first half then therapist completed second half: short neck, 5 diaphragmatic breaths, bilateral axillary nodes, bilateral pectoral nodes, bilateral chest, short neck, posterior, lateral and anterior neck then retracing all steps                          PT Long Term Goals - 02/20/21 1647       PT LONG TERM GOAL #1   Title Pt will be independent in self MLD for long term management of lymphedema.    Time 4    Period Weeks    Status New    Target Date 03/20/21      PT LONG TERM GOAL #2   Title Pt will obtain appropriate compression garment for long term management of lymphedema.    Time 4    Period Weeks    Status New    Target Date 03/20/21      PT LONG TERM GOAL #3   Title Pt will report a 50% decrease in edema at anterior neck to decrease risk of infection.    Time 4    Period Weeks    Status New    Target Date 03/20/21                   Plan - 03/13/21 1711     Clinical Impression Statement Had pt demonstrate correct self MLD technique. Pt only required cueing for correct skin stretch when working on his chest. He demonstrate a good skin stretch with appropriate direction of skin stretch when on his neck. By end of session pt demonstrated independence with self MLD. Pt is working with compression pump company to see what his insurance will cover for a compression pump. Next session will talk about compression garments for long term management.    PT  Frequency 2x / week    PT Duration 4 weeks    PT Treatment/Interventions ADLs/Self Care Home Management;Patient/family education;Therapeutic exercise;Manual techniques;Manual lymph drainage;Orthotic Fit/Training;Compression bandaging;Taping;Vasopneumatic Device    PT Next Visit Plan hear from [employer]? If not let pt know answer from email regarding which company is covering the [place].  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[facility])     Problem List Patient Active Problem List   Diagnosis Date Noted   Encounter for dental examination and cleaning without abnormal findings 03/08/2021   Teeth missing 03/08/2021   Caries 03/08/2021   Defective dental restoration 03/08/2021   Excessive dental attrition 03/08/2021   Abfraction 03/08/2021   Diastema 03/08/2021   Malocclusion 03/08/2021   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   Incipient enamel caries 03/08/2021   Chronic periodontal disease 03/08/2021   Acquired lymphedema 01/11/2021   History of head and neck radiation 01/07/2021   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   Dysgeusia 01/07/2021   Accretions on teeth 01/07/2021   Pancytopenia, acquired ([place]) 12/07/2020   Dry mouth 12/07/2020   Leukopenia due to antineoplastic chemotherapy ([place]) 11/08/2020   Port-A-Cath in place 10/03/2020   Intractable hiccups 10/03/2020   Thrombocytopenia ([place]) 10/03/2020   Chemotherapy-induced fatigue 09/27/2020   Weight loss, unintentional 06/01/2020   Malnutrition of moderate degree 05/28/2020    GERD (gastroesophageal reflux disease)    Volume depletion    Hyperbilirubinemia    Hyponatremia    Mild protein-calorie malnutrition ([place])    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[place]) 05/17/2020   Mediastinal adenopathy 05/17/2020    Manus Gunning, PT 03/13/2021, 5:14 PM  [facility] @ [place], [place], [address] Phone: [phone]   Fax:  [phone]  Name: Alexander [name] MRN: [id number] Date of Birth: 12-18-1960

## 2021-03-18 ENCOUNTER — Other Ambulatory Visit: Payer: Self-pay

## 2021-03-18 ENCOUNTER — Encounter: Payer: Self-pay | Admitting: Physical Therapy

## 2021-03-18 ENCOUNTER — Ambulatory Visit: Payer: PRIVATE HEALTH INSURANCE | Admitting: Physical Therapy

## 2021-03-18 DIAGNOSIS — R293 Abnormal posture: Secondary | ICD-10-CM

## 2021-03-18 DIAGNOSIS — I890 Lymphedema, not elsewhere classified: Secondary | ICD-10-CM

## 2021-03-18 DIAGNOSIS — C109 Malignant neoplasm of oropharynx, unspecified: Secondary | ICD-10-CM

## 2021-03-18 DIAGNOSIS — R1313 Dysphagia, pharyngeal phase: Secondary | ICD-10-CM | POA: Diagnosis not present

## 2021-03-18 NOTE — Therapy (Signed)
[facility] @ [place], [place], [address] Phone: [phone]   Fax:  [phone]  Physical Therapy Treatment  Patient Details  Name: Alexander [name] MRN: [id number] Date of Birth: 01-23-1961 Referring Provider (PT): Alvy Bimler   Encounter Date: 03/18/2021   PT End of Session - 03/18/21 1450     Visit Number 5    Number of Visits 9    Date for PT Re-Evaluation 03/20/21    PT Start Time 6063    PT Stop Time 0160    PT Time Calculation (min) 41 min    Activity Tolerance Patient tolerated treatment well    Behavior During Therapy [facility] for tasks assessed/performed             Past Medical History:  Diagnosis Date   Asthma    Cancer ([place])    GERD (gastroesophageal reflux disease)    Sleep apnea    CPAP- does not wear    Past Surgical History:  Procedure Laterality Date   BRONCHIAL NEEDLE ASPIRATION BIOPSY  05/22/2020   Procedure: BRONCHIAL NEEDLE ASPIRATION BIOPSIES;  Surgeon: Garner Nash, DO;  Location: [place];  Service: Pulmonary;;   DIRECT LARYNGOSCOPY  05/09/2020   Procedure: DIRECT LARYNGOSCOPY;  Surgeon: Rozetta Nunnery, MD;  Location: [place];  Service: ENT;;   IR GASTROSTOMY TUBE MOD SED  09/13/2020   IR GASTROSTOMY TUBE REMOVAL  12/14/2020   IR IMAGING GUIDED PORT INSERTION  06/12/2020   MASS EXCISION N/A 05/09/2020   Procedure: EXCISION TONGUE MASS;  Surgeon: Rozetta Nunnery, MD;  Location: [place];  Service: ENT;  Laterality: N/A;   TONSILLECTOMY     TRACHEOSTOMY TUBE PLACEMENT  05/09/2020   Procedure: TRACHEOSTOMY;  Surgeon: Rozetta Nunnery, MD;  Location: [place];  Service: ENT;;   VIDEO BRONCHOSCOPY WITH ENDOBRONCHIAL ULTRASOUND N/A 05/22/2020   Procedure: VIDEO BRONCHOSCOPY WITH ENDOBRONCHIAL ULTRASOUND;  Surgeon: Garner Nash, DO;  Location: [place];  Service: Pulmonary;  Laterality: N/A;    There were no vitals filed for this visit.   Subjective Assessment -  03/18/21 1404     Subjective I should be able to get my compression pump covered. I was not able to do much over the weekend.    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 That portion of the mass had poorly defined margins but appeared to cross the midline and was estimated to measure at least 5.0 x 4.7 x 3.4 cm. The epiglottis and aryeglottic folds were not well separately delineated and may have been involved by mass. Tumor extension was suspected inferiorly at least to the level of the supraglottic larynx, 05/11/20 CT chest showed subcarinal mass/adenopathy and low lung volumes with bibasilar atelectasis. However, there was no acute intrathoracic pathology, 05/03/20 He saw Dr. [name] who performed a fiberoptic laryngoscopy through the right nostril. Nasopharyngeal was clear, but the base of the tongue was full on the left side and epiglottis was slightly pushed back posteriorly with questionable ulcer on the left base of the tongue extending down to the left vallecular area, biopsy of the base of the tongue on 05/09/2020 revealed: invasive squamous cell carcinoma.  Strongly p16 positive; emergent tracheostomy was also performed at that time, received induction chemotherapy of 3 cycles of Carbo/5 FU, will now receive 35 fractions of radiation to his Base of tongue and bilateral neck and 7 weeks of cisplatin chemotherapy. He will also receive radiation to his subcarinal node in his chest area. He started on 09/19/20 and will complete 11/07/20, PAC 06/12/20, PEG 09/13/20, Tracheostomy placed 05/09/20 and decannulated 08/31/20.    Patient Stated Goals to limit the accumulation of lymphatic fluid in my neck     Currently in Pain? No/denies    Pain Score 0-No pain                               [facility] Adult PT Treatment/Exercise - 03/18/21 0001       Manual Therapy   Manual Lymphatic Drainage (MLD) supine with head elevated: short neck, 5 diaphragmatic breaths, bilateral axillary nodes, bilateral pectoral nodes, bilateral chest, short neck, posterior, lateral and anterior neck then retracing all steps                     PT Education - 03/18/21 1451     Education Details need for head and neck compression garment, pt requests his demographics be sent to [employer] to check benefits, pt to be measured on Wed for [employer] head and neck    Person(s) Educated Patient    Methods Explanation    Comprehension Verbalized understanding                 PT Long Term Goals - 02/20/21 1647       PT LONG TERM GOAL #1   Title Pt will be independent in self MLD for long term management of lymphedema.    Time 4    Period Weeks    Status New    Target Date 03/20/21      PT LONG TERM GOAL #2   Title Pt will obtain appropriate compression garment for long term management of lymphedema.    Time 4    Period Weeks    Status New    Target Date 03/20/21      PT LONG TERM GOAL #3   Title Pt will report a 50% decrease in edema at anterior neck to decrease risk of infection.    Time 4    Period Weeks    Status New    Target Date 03/20/21                   Plan - 03/18/21 1454     Clinical Impression Statement Continued with MLD today to anterior neck to decrease fullness and fibrosis. Signed pt up to be measured by [facility] for a jovipak head and neck garment on Wednesday. Pt agreeable to having his demographic info sent to [facility] to check for coverage.    PT Frequency 2x / week    PT Duration 4 weeks    PT Treatment/Interventions ADLs/Self Care Home Management;Patient/family education;Therapeutic exercise;Manual techniques;Manual lymph drainage;Orthotic  Fit/Training;Compression bandaging;Taping;Vasopneumatic Device    PT Next Visit Plan review self MLD as needed, perform MLD anterior neck, see if heard back from [employer] regarding coverage for jovipak, pt to be measured on Wed    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[facility])     Problem List Patient Active Problem List   Diagnosis Date Noted   Encounter for dental examination and cleaning without abnormal findings 03/08/2021   Teeth missing 03/08/2021   Caries 03/08/2021   Defective dental restoration 03/08/2021   Excessive dental attrition 03/08/2021   Abfraction 03/08/2021   Diastema 03/08/2021   Malocclusion 03/08/2021   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   Incipient enamel caries 03/08/2021   Chronic periodontal disease 03/08/2021   Acquired lymphedema 01/11/2021   History of head and neck radiation 01/07/2021   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   Dysgeusia 01/07/2021   Accretions on teeth 01/07/2021   Pancytopenia, acquired ([place]) 12/07/2020   Dry mouth 12/07/2020   Leukopenia due to antineoplastic chemotherapy ([place]) 11/08/2020   Port-A-Cath in place 10/03/2020   Intractable hiccups 10/03/2020   Thrombocytopenia ([place]) 10/03/2020   Chemotherapy-induced fatigue 09/27/2020   Weight loss, unintentional 06/01/2020   Malnutrition of moderate degree 05/28/2020   GERD (gastroesophageal reflux disease)    Volume depletion    Hyperbilirubinemia    Hyponatremia    Mild protein-calorie malnutrition ([place])    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[place]) 05/17/2020   Mediastinal adenopathy 05/17/2020     Manus Gunning, PT 03/18/2021, 2:56 PM  [facility] @ [place], [place], [address] Phone: [phone]   Fax:  [phone]  Name: Alexander [name] MRN: [id number] Date of Birth: 1960-12-09

## 2021-03-20 ENCOUNTER — Encounter: Payer: Self-pay | Admitting: Physical Therapy

## 2021-03-20 ENCOUNTER — Other Ambulatory Visit: Payer: Self-pay

## 2021-03-20 ENCOUNTER — Ambulatory Visit: Payer: PRIVATE HEALTH INSURANCE | Attending: Hematology and Oncology | Admitting: Physical Therapy

## 2021-03-20 DIAGNOSIS — R293 Abnormal posture: Secondary | ICD-10-CM | POA: Insufficient documentation

## 2021-03-20 DIAGNOSIS — C109 Malignant neoplasm of oropharynx, unspecified: Secondary | ICD-10-CM | POA: Insufficient documentation

## 2021-03-20 DIAGNOSIS — I890 Lymphedema, not elsewhere classified: Secondary | ICD-10-CM | POA: Insufficient documentation

## 2021-03-20 NOTE — Therapy (Signed)
[facility] @ [place], [place], [address] Phone: [phone]   Fax:  [phone]  Physical Therapy Treatment  Patient Details  Name: Alexander [name] MRN: [id number] Date of Birth: Jun 19, 1960 Referring Provider (PT): Alvy Bimler   Encounter Date: 03/20/2021   PT End of Session - 03/20/21 1546     Visit Number 6    Number of Visits 9    Date for PT Re-Evaluation 03/20/21    PT Start Time 1504    PT Stop Time 1546    PT Time Calculation (min) 42 min    Activity Tolerance Patient tolerated treatment well    Behavior During Therapy [facility] for tasks assessed/performed             Past Medical History:  Diagnosis Date   Asthma    Cancer ([place])    GERD (gastroesophageal reflux disease)    Sleep apnea    CPAP- does not wear    Past Surgical History:  Procedure Laterality Date   BRONCHIAL NEEDLE ASPIRATION BIOPSY  05/22/2020   Procedure: BRONCHIAL NEEDLE ASPIRATION BIOPSIES;  Surgeon: Garner Nash, DO;  Location: [place];  Service: Pulmonary;;   DIRECT LARYNGOSCOPY  05/09/2020   Procedure: DIRECT LARYNGOSCOPY;  Surgeon: Rozetta Nunnery, MD;  Location: [place];  Service: ENT;;   IR GASTROSTOMY TUBE MOD SED  09/13/2020   IR GASTROSTOMY TUBE REMOVAL  12/14/2020   IR IMAGING GUIDED PORT INSERTION  06/12/2020   MASS EXCISION N/A 05/09/2020   Procedure: EXCISION TONGUE MASS;  Surgeon: Rozetta Nunnery, MD;  Location: [place];  Service: ENT;  Laterality: N/A;   TONSILLECTOMY     TRACHEOSTOMY TUBE PLACEMENT  05/09/2020   Procedure: TRACHEOSTOMY;  Surgeon: Rozetta Nunnery, MD;  Location: [place];  Service: ENT;;   VIDEO BRONCHOSCOPY WITH ENDOBRONCHIAL ULTRASOUND N/A 05/22/2020   Procedure: VIDEO BRONCHOSCOPY WITH ENDOBRONCHIAL ULTRASOUND;  Surgeon: Garner Nash, DO;  Location: [place];  Service: Pulmonary;  Laterality: N/A;    There were no vitals filed for this visit.   Subjective Assessment -  03/20/21 1504     Subjective The neck feels more solid today.    Patient Stated Goals to limit the accumulation of lymphatic fluid in my neck    Currently in Pain? No/denies    Pain Score 0-No pain                               [facility] Adult PT Treatment/Exercise - 03/20/21 0001       Manual Therapy   Manual Lymphatic Drainage (MLD) supine with head elevated: short neck, 5 diaphragmatic breaths, bilateral axillary nodes, bilateral pectoral nodes, bilateral chest, short neck, posterior, lateral and anterior neck then retracing all steps                          PT Long Term Goals - 02/20/21 1647       PT LONG TERM GOAL #1   Title Pt will be independent in self MLD for long term management of lymphedema.    Time 4    Period Weeks    Status New    Target Date 03/20/21      PT LONG TERM GOAL #2   Title Pt will obtain appropriate compression garment for long term management of lymphedema.    Time 4    Period Weeks  Status New    Target Date 03/20/21      PT LONG TERM GOAL #3   Title Pt will report a 50% decrease in edema at anterior neck to decrease risk of infection.    Time 4    Period Weeks    Status New    Target Date 03/20/21                   Plan - 03/20/21 1548     Clinical Impression Statement Discussed with pt that [employer] is out of network with his insurance so they will not cover his compression garments. He is waiting to see if his other insurance with cover it. Pt feels he has increased fibrosis in his anterior neck. Continued with MLD with increased time spent at anterior neck. Encouraged pt to contact his insurance company to find out who is in network with his insurance.    PT Frequency 2x / week    PT Duration 4 weeks    PT Treatment/Interventions ADLs/Self Care Home Management;Patient/family education;Therapeutic exercise;Manual techniques;Manual lymph drainage;Orthotic Fit/Training;Compression  bandaging;Taping;Vasopneumatic Device    PT Next Visit Plan review self MLD as needed, perform MLD anterior neck, see if heard back from [employer] regarding coverage for jovipak, pt to be measured on Wed    PT Home Exercise Plan head and neck ROM exercises, self MLD    Consulted and Agree with Plan of Care Patient             Patient will benefit from skilled therapeutic intervention in order to improve the following deficits and impairments:  Postural dysfunction, Decreased knowledge of precautions, Increased edema  Visit Diagnosis: Lymphedema, not elsewhere classified  Abnormal posture     Problem List Patient Active Problem List   Diagnosis Date Noted   Encounter for dental examination and cleaning without abnormal findings 03/08/2021   Teeth missing 03/08/2021   Caries 03/08/2021   Defective dental restoration 03/08/2021   Excessive dental attrition 03/08/2021   Abfraction 03/08/2021   Diastema 03/08/2021   Malocclusion 03/08/2021   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   Incipient enamel caries 03/08/2021   Chronic periodontal disease 03/08/2021   Acquired lymphedema 01/11/2021   History of head and neck radiation 01/07/2021   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   Dysgeusia 01/07/2021   Accretions on teeth 01/07/2021   Pancytopenia, acquired ([place]) 12/07/2020   Dry mouth 12/07/2020   Leukopenia due to antineoplastic chemotherapy ([place]) 11/08/2020   Port-A-Cath in place 10/03/2020   Intractable hiccups 10/03/2020   Thrombocytopenia ([place]) 10/03/2020   Chemotherapy-induced fatigue 09/27/2020   Weight loss, unintentional 06/01/2020   Malnutrition of moderate degree 05/28/2020   GERD (gastroesophageal reflux disease)    Volume depletion    Hyperbilirubinemia    Hyponatremia    Mild protein-calorie malnutrition ([place])    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[place]) 05/17/2020   Mediastinal adenopathy 05/17/2020    Manus Gunning, PT 03/20/2021, 3:50  PM  [facility] @ [place], [place], [address] Phone: [phone]   Fax:  [phone]  Name: Alexander [name] MRN: [id number] Date of Birth: 1960/04/05

## 2021-03-25 ENCOUNTER — Ambulatory Visit: Payer: BC Managed Care – PPO | Admitting: Emergency Medicine

## 2021-03-27 ENCOUNTER — Encounter: Payer: Self-pay | Admitting: Physical Therapy

## 2021-03-27 ENCOUNTER — Ambulatory Visit: Payer: PRIVATE HEALTH INSURANCE | Admitting: Physical Therapy

## 2021-03-27 ENCOUNTER — Other Ambulatory Visit: Payer: Self-pay

## 2021-03-27 DIAGNOSIS — C109 Malignant neoplasm of oropharynx, unspecified: Secondary | ICD-10-CM

## 2021-03-27 DIAGNOSIS — R293 Abnormal posture: Secondary | ICD-10-CM

## 2021-03-27 DIAGNOSIS — I890 Lymphedema, not elsewhere classified: Secondary | ICD-10-CM

## 2021-03-27 NOTE — Therapy (Signed)
[facility] @ [place], [place], [address] Phone: [phone]   Fax:  [phone]  Physical Therapy Treatment  Patient Details  Name: Alexander [name] MRN: [id number] Date of Birth: [birth year] Referring Provider (PT): Alvy Bimler   Encounter Date: 03/27/2021   PT End of Session - 03/27/21 1649     Visit Number 7    Number of Visits 15    Date for PT Re-Evaluation 04/24/21    PT Start Time 1937    PT Stop Time 1648    PT Time Calculation (min) 50 min    Activity Tolerance Patient tolerated treatment well    Behavior During Therapy [facility] for tasks assessed/performed             Past Medical History:  Diagnosis Date   Asthma    Cancer ([place])    GERD (gastroesophageal reflux disease)    Sleep apnea    CPAP- does not wear    Past Surgical History:  Procedure Laterality Date   BRONCHIAL NEEDLE ASPIRATION BIOPSY  05/22/2020   Procedure: BRONCHIAL NEEDLE ASPIRATION BIOPSIES;  Surgeon: Garner Nash, DO;  Location: [place];  Service: Pulmonary;;   DIRECT LARYNGOSCOPY  05/09/2020   Procedure: DIRECT LARYNGOSCOPY;  Surgeon: Rozetta Nunnery, MD;  Location: [place];  Service: ENT;;   IR GASTROSTOMY TUBE MOD SED  09/13/2020   IR GASTROSTOMY TUBE REMOVAL  12/14/2020   IR IMAGING GUIDED PORT INSERTION  06/12/2020   MASS EXCISION N/A 05/09/2020   Procedure: EXCISION TONGUE MASS;  Surgeon: Rozetta Nunnery, MD;  Location: [place];  Service: ENT;  Laterality: N/A;   TONSILLECTOMY     TRACHEOSTOMY TUBE PLACEMENT  05/09/2020   Procedure: TRACHEOSTOMY;  Surgeon: Rozetta Nunnery, MD;  Location: [place];  Service: ENT;;   VIDEO BRONCHOSCOPY WITH ENDOBRONCHIAL ULTRASOUND N/A 05/22/2020   Procedure: VIDEO BRONCHOSCOPY WITH ENDOBRONCHIAL ULTRASOUND;  Surgeon: Garner Nash, DO;  Location: [place];  Service: Pulmonary;  Laterality: N/A;    There were no vitals filed for this visit.   Subjective Assessment -  03/27/21 1559     Subjective Neck was nothing out of the norm.    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 That portion of the mass had poorly defined margins but appeared to cross the midline and was estimated to measure at least 5.0 x 4.7 x 3.4 cm. The epiglottis and aryeglottic folds were not well separately delineated and may have been involved by mass. Tumor extension was suspected inferiorly at least to the level of the supraglottic larynx, 05/11/20 CT chest showed subcarinal mass/adenopathy and low lung volumes with bibasilar atelectasis. However, there was no acute intrathoracic pathology, 05/03/20 He saw Dr. [name] who performed a fiberoptic laryngoscopy through the right nostril. Nasopharyngeal was clear, but the base of the tongue was full on the left side and epiglottis was slightly pushed back posteriorly with questionable ulcer on the left base of the tongue extending down to the left vallecular area, biopsy of the base of the tongue on 05/09/2020 revealed: invasive squamous cell carcinoma.  Strongly p16 positive; emergent tracheostomy was also performed at that time, received  induction chemotherapy of 3 cycles of Carbo/5 FU, will now receive 35 fractions of radiation to his Base of tongue and bilateral neck and 7 weeks of cisplatin chemotherapy. He will also receive radiation to his subcarinal node in his chest area. He started on 09/19/20 and will complete 11/07/20, PAC 06/12/20, PEG 09/13/20, Tracheostomy placed 05/09/20 and decannulated 08/31/20.    Patient Stated Goals to limit the accumulation of lymphatic fluid in my neck    Currently in Pain? No/denies    Pain Score 0-No pain                                [facility] Adult PT Treatment/Exercise - 03/27/21 0001       Manual Therapy   Manual Lymphatic Drainage (MLD) supine with head elevated: short neck, 5 diaphragmatic breaths, bilateral axillary nodes, bilateral pectoral nodes, bilateral chest, short neck, posterior, lateral and anterior neck then retracing all steps                          PT Long Term Goals - 03/27/21 1601       PT LONG TERM GOAL #1   Title Pt will be independent in self MLD for long term management of lymphedema.    Baseline 03/27/21- pt is independent with this    Time 4    Period Weeks    Status Achieved      PT LONG TERM GOAL #2   Title Pt will obtain appropriate compression garment for long term management of lymphedema.    Baseline 03/27/21- pt is finding out if his insurance covers his garments    Time 4    Period Weeks    Status On-going      PT LONG TERM GOAL #3   Title Pt will report a 50% decrease in edema at anterior neck to decrease risk of infection.    Baseline 03/27/21- 40-50% decreased    Time 4    Period Weeks    Status Achieved                   Plan - 03/27/21 1650     Clinical Impression Statement Assessed pt's progress towards goals in therapy. Pt is progressing towards his goals. He is now independent in self MLD but is still awaiting compression garments. He plans on calling his insurance company to see if there is a DME supplier that is in network with his insurance. Continued with MLD to anterior neck today focused on anterior neck where there is increased fullness. Pt would benefit from additional skilled PT services to continue with MLD and assess his compression garments for proper fit and compression level once he receives them.    PT Frequency 2x / week    PT Duration 4 weeks    PT Treatment/Interventions ADLs/Self Care Home Management;Patient/family education;Therapeutic exercise;Manual techniques;Manual lymph  drainage;Orthotic Fit/Training;Compression bandaging;Taping;Vasopneumatic Device    PT Next Visit Plan review self MLD as needed, perform MLD anterior neck, see if heard back from [employer] regarding coverage for jovipak, pt to be measured on Wed    PT Home Exercise Plan head and neck ROM exercises, self MLD    Consulted and Agree with Plan of Care Patient             Patient will benefit from skilled therapeutic intervention in order to improve the following deficits and impairments:  Postural  dysfunction, Decreased knowledge of precautions, Increased edema  Visit Diagnosis: Lymphedema, not elsewhere classified  Abnormal posture  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[facility])     Problem List Patient Active Problem List   Diagnosis Date Noted   Encounter for dental examination and cleaning without abnormal findings 03/08/2021   Teeth missing 03/08/2021   Caries 03/08/2021   Defective dental restoration 03/08/2021   Excessive dental attrition 03/08/2021   Abfraction 03/08/2021   Diastema 03/08/2021   Malocclusion 03/08/2021   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   Incipient enamel caries 03/08/2021   Chronic periodontal disease 03/08/2021   Acquired lymphedema 01/11/2021   History of head and neck radiation 01/07/2021   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   Dysgeusia 01/07/2021   Accretions on teeth 01/07/2021   Pancytopenia, acquired ([place]) 12/07/2020   Dry mouth 12/07/2020   Leukopenia due to antineoplastic chemotherapy ([place]) 11/08/2020   Port-A-Cath in place 10/03/2020   Intractable hiccups 10/03/2020   Thrombocytopenia ([place]) 10/03/2020   Chemotherapy-induced fatigue 09/27/2020   Weight loss, unintentional 06/01/2020   Malnutrition of moderate degree 05/28/2020   GERD (gastroesophageal reflux disease)    Volume depletion    Hyperbilirubinemia    Hyponatremia    Mild protein-calorie malnutrition ([place])    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[place]) 05/17/2020   Mediastinal  adenopathy 05/17/2020    Manus Gunning, PT 03/27/2021, 4:52 PM  [facility] @ [place], [place], [address] Phone: [phone]   Fax:  [phone]  Name: Alexander [name] MRN: [id number] Date of Birth: [birth year]

## 2021-03-29 ENCOUNTER — Ambulatory Visit: Payer: PRIVATE HEALTH INSURANCE | Admitting: Rehabilitation

## 2021-03-29 ENCOUNTER — Encounter: Payer: Self-pay | Admitting: Rehabilitation

## 2021-03-29 ENCOUNTER — Other Ambulatory Visit: Payer: Self-pay

## 2021-03-29 DIAGNOSIS — C109 Malignant neoplasm of oropharynx, unspecified: Secondary | ICD-10-CM

## 2021-03-29 DIAGNOSIS — I890 Lymphedema, not elsewhere classified: Secondary | ICD-10-CM | POA: Diagnosis not present

## 2021-03-29 DIAGNOSIS — R293 Abnormal posture: Secondary | ICD-10-CM

## 2021-03-29 NOTE — Therapy (Signed)
[facility] @ [place], [place], [address] Phone: [phone]   Fax:  [phone]  Physical Therapy Treatment  Patient Details  Name: Alexander [name] MRN: [id number] Date of Birth: [birth year] Referring Provider (PT): Alvy Bimler   Encounter Date: 03/29/2021   PT End of Session - 03/29/21 0900     Visit Number 8    Number of Visits 15    Date for PT Re-Evaluation 04/24/21    PT Start Time 0910    PT Stop Time 0950    PT Time Calculation (min) 40 min    Activity Tolerance Patient tolerated treatment well    Behavior During Therapy [facility] for tasks assessed/performed             Past Medical History:  Diagnosis Date   Asthma    Cancer ([place])    GERD (gastroesophageal reflux disease)    Sleep apnea    CPAP- does not wear    Past Surgical History:  Procedure Laterality Date   BRONCHIAL NEEDLE ASPIRATION BIOPSY  05/22/2020   Procedure: BRONCHIAL NEEDLE ASPIRATION BIOPSIES;  Surgeon: Garner Nash, DO;  Location: [place];  Service: Pulmonary;;   DIRECT LARYNGOSCOPY  05/09/2020   Procedure: DIRECT LARYNGOSCOPY;  Surgeon: Rozetta Nunnery, MD;  Location: [place];  Service: ENT;;   IR GASTROSTOMY TUBE MOD SED  09/13/2020   IR GASTROSTOMY TUBE REMOVAL  12/14/2020   IR IMAGING GUIDED PORT INSERTION  06/12/2020   MASS EXCISION N/A 05/09/2020   Procedure: EXCISION TONGUE MASS;  Surgeon: Rozetta Nunnery, MD;  Location: [place];  Service: ENT;  Laterality: N/A;   TONSILLECTOMY     TRACHEOSTOMY TUBE PLACEMENT  05/09/2020   Procedure: TRACHEOSTOMY;  Surgeon: Rozetta Nunnery, MD;  Location: [place];  Service: ENT;;   VIDEO BRONCHOSCOPY WITH ENDOBRONCHIAL ULTRASOUND N/A 05/22/2020   Procedure: VIDEO BRONCHOSCOPY WITH ENDOBRONCHIAL ULTRASOUND;  Surgeon: Garner Nash, DO;  Location: [place];  Service: Pulmonary;  Laterality: N/A;    There were no vitals filed for this visit.   Subjective Assessment -  03/29/21 0901     Subjective still working on the pump. 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 That portion of the mass had poorly defined margins but appeared to cross the midline and was estimated to measure at least 5.0 x 4.7 x 3.4 cm. The epiglottis and aryeglottic folds were not well separately delineated and may have been involved by mass. Tumor extension was suspected inferiorly at least to the level of the supraglottic larynx, 05/11/20 CT chest showed subcarinal mass/adenopathy and low lung volumes with bibasilar atelectasis. However, there was no acute intrathoracic pathology, 05/03/20 He saw Dr. [name] who performed a fiberoptic laryngoscopy through the right nostril. Nasopharyngeal was clear, but the base of the tongue was full on the left side and epiglottis was slightly pushed back posteriorly with questionable ulcer on the left base of the tongue extending down to the left vallecular area, biopsy of the base of the tongue on 05/09/2020 revealed: invasive squamous cell carcinoma.  Strongly p16 positive; emergent  tracheostomy was also performed at that time, received induction chemotherapy of 3 cycles of Carbo/5 FU, will now receive 35 fractions of radiation to his Base of tongue and bilateral neck and 7 weeks of cisplatin chemotherapy. He will also receive radiation to his subcarinal node in his chest area. He started on 09/19/20 and will complete 11/07/20, PAC 06/12/20, PEG 09/13/20, Tracheostomy placed 05/09/20 and decannulated 08/31/20.    Currently in Pain? No/denies                   LYMPHEDEMA/ONCOLOGY QUESTIONNAIRE -  03/29/21 0001       Head and Neck   6 cm superior to sternal notch around neck 42.8 cm    8 cm superior to sternal notch around neck 44 cm                        [facility] Adult PT Treatment/Exercise - 03/29/21 0001       Manual Therapy   Manual Lymphatic Drainage (MLD) supine with head elevated: short neck, 5 diaphragmatic breaths, bilateral axillary nodes, bilateral pectoral nodes, bilateral chest, short neck, posterior, lateral and anterior neck then retracing all steps                          PT Long Term Goals - 03/27/21 1601       PT LONG TERM GOAL #1   Title Pt will be independent in self MLD for long term management of lymphedema.    Baseline 03/27/21- pt is independent with this    Time 4    Period Weeks    Status Achieved      PT LONG TERM GOAL #2   Title Pt will obtain appropriate compression garment for long term management of lymphedema.    Baseline 03/27/21- pt is finding out if his insurance covers his garments    Time 4    Period Weeks    Status On-going      PT LONG TERM GOAL #3   Title Pt will report a 50% decrease in edema at anterior neck to decrease risk of infection.    Baseline 03/27/21- 40-50% decreased    Time 4    Period Weeks    Status Achieved                   Plan - 03/29/21 0951     Clinical Impression Statement Continued with MLD of the neck and chin.  Remeasured circumferences which have decreased almost 3-4 cm.  Still awaiting insurance approval for garments and awaiting pump okay as well.    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[facility])     Problem List Patient Active Problem List   Diagnosis Date Noted   Encounter for dental examination and cleaning without abnormal findings 03/08/2021   Teeth missing 03/08/2021   Caries 03/08/2021   Defective dental restoration 03/08/2021   Excessive dental attrition 03/08/2021   Abfraction 03/08/2021   Diastema 03/08/2021   Malocclusion 03/08/2021   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   Incipient  enamel caries 03/08/2021   Chronic periodontal disease 03/08/2021   Acquired lymphedema 01/11/2021   History of head and neck radiation 01/07/2021   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   Dysgeusia 01/07/2021   Accretions on teeth 01/07/2021   Pancytopenia, acquired ([place]) 12/07/2020   Dry mouth 12/07/2020   Leukopenia due to antineoplastic chemotherapy ([place]) 11/08/2020   Port-A-Cath in place 10/03/2020   Intractable hiccups 10/03/2020   Thrombocytopenia ([place]) 10/03/2020   Chemotherapy-induced fatigue 09/27/2020   Weight loss, unintentional 06/01/2020   Malnutrition of moderate degree 05/28/2020   GERD (gastroesophageal reflux disease)    Volume depletion    Hyperbilirubinemia    Hyponatremia    Mild protein-calorie malnutrition ([place])    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[place]) 05/17/2020   Mediastinal adenopathy 05/17/2020    Stark Bray, PT 03/29/2021, 9:52 AM  [place] [facility] @ [place], [place], [address] Phone: [phone]   Fax:  [phone]  Name: Alexander [name] MRN: [id number] Date of Birth: 09-06-1960

## 2021-04-02 ENCOUNTER — Ambulatory Visit: Payer: PRIVATE HEALTH INSURANCE | Admitting: Physical Therapy

## 2021-04-02 ENCOUNTER — Encounter: Payer: Self-pay | Admitting: Physical Therapy

## 2021-04-02 ENCOUNTER — Other Ambulatory Visit: Payer: Self-pay

## 2021-04-02 DIAGNOSIS — I890 Lymphedema, not elsewhere classified: Secondary | ICD-10-CM | POA: Diagnosis not present

## 2021-04-02 DIAGNOSIS — R293 Abnormal posture: Secondary | ICD-10-CM

## 2021-04-02 DIAGNOSIS — C109 Malignant neoplasm of oropharynx, unspecified: Secondary | ICD-10-CM

## 2021-04-02 NOTE — Therapy (Signed)
[facility] @ [place], [place], [address] Phone: [phone]   Fax:  [phone]  Physical Therapy Treatment  Patient Details  Name: Alexander [name] MRN: [id number] Date of Birth: 1960/04/13 Referring Provider (PT): Alvy Bimler   Encounter Date: 04/02/2021   PT End of Session - 04/02/21 1500     Visit Number 9    Number of Visits 15    Date for PT Re-Evaluation 04/24/21    PT Start Time 5498    PT Stop Time 1549    PT Time Calculation (min) 51 min    Activity Tolerance Patient tolerated treatment well    Behavior During Therapy [facility] for tasks assessed/performed             Past Medical History:  Diagnosis Date   Asthma    Cancer ([place])    GERD (gastroesophageal reflux disease)    Sleep apnea    CPAP- does not wear    Past Surgical History:  Procedure Laterality Date   BRONCHIAL NEEDLE ASPIRATION BIOPSY  05/22/2020   Procedure: BRONCHIAL NEEDLE ASPIRATION BIOPSIES;  Surgeon: Garner Nash, DO;  Location: [place];  Service: Pulmonary;;   DIRECT LARYNGOSCOPY  05/09/2020   Procedure: DIRECT LARYNGOSCOPY;  Surgeon: Rozetta Nunnery, MD;  Location: [place];  Service: ENT;;   IR GASTROSTOMY TUBE MOD SED  09/13/2020   IR GASTROSTOMY TUBE REMOVAL  12/14/2020   IR IMAGING GUIDED PORT INSERTION  06/12/2020   MASS EXCISION N/A 05/09/2020   Procedure: EXCISION TONGUE MASS;  Surgeon: Rozetta Nunnery, MD;  Location: [place];  Service: ENT;  Laterality: N/A;   TONSILLECTOMY     TRACHEOSTOMY TUBE PLACEMENT  05/09/2020   Procedure: TRACHEOSTOMY;  Surgeon: Rozetta Nunnery, MD;  Location: [place];  Service: ENT;;   VIDEO BRONCHOSCOPY WITH ENDOBRONCHIAL ULTRASOUND N/A 05/22/2020   Procedure: VIDEO BRONCHOSCOPY WITH ENDOBRONCHIAL ULTRASOUND;  Surgeon: Garner Nash, DO;  Location: [place];  Service: Pulmonary;  Laterality: N/A;    There were no vitals filed for this visit.   Subjective Assessment -  04/02/21 1549     Subjective They have not gotten back to me on the garments yet to tell me if they are covered.    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 That portion of the mass had poorly defined margins but appeared to cross the midline and was estimated to measure at least 5.0 x 4.7 x 3.4 cm. The epiglottis and aryeglottic folds were not well separately delineated and may have been involved by mass. Tumor extension was suspected inferiorly at least to the level of the supraglottic larynx, 05/11/20 CT chest showed subcarinal mass/adenopathy and low lung volumes with bibasilar atelectasis. However, there was no acute intrathoracic pathology, 05/03/20 He saw Dr. [name] who performed a fiberoptic laryngoscopy through the right nostril. Nasopharyngeal was clear, but the base of the tongue was full on the left side and epiglottis was slightly pushed back posteriorly with questionable ulcer on the left base of the tongue extending down to the left vallecular area, biopsy of the base of the tongue on 05/09/2020 revealed: invasive squamous cell carcinoma.  Strongly  p16 positive; emergent tracheostomy was also performed at that time, received induction chemotherapy of 3 cycles of Carbo/5 FU, will now receive 35 fractions of radiation to his Base of tongue and bilateral neck and 7 weeks of cisplatin chemotherapy. He will also receive radiation to his subcarinal node in his chest area. He started on 09/19/20 and will complete 11/07/20, PAC 06/12/20, PEG 09/13/20, Tracheostomy placed 05/09/20 and decannulated 08/31/20.    Patient Stated Goals to limit the accumulation of lymphatic fluid in my neck    Currently in  Pain? No/denies    Pain Score 0-No pain                               [facility] Adult PT Treatment/Exercise - 04/02/21 0001       Manual Therapy   Manual Lymphatic Drainage (MLD) supine with head elevated: short neck, 5 diaphragmatic breaths, bilateral axillary nodes, bilateral pectoral nodes, bilateral chest, short neck, posterior, lateral and anterior neck then retracing all steps                          PT Long Term Goals - 03/27/21 1601       PT LONG TERM GOAL #1   Title Pt will be independent in self MLD for long term management of lymphedema.    Baseline 03/27/21- pt is independent with this    Time 4    Period Weeks    Status Achieved      PT LONG TERM GOAL #2   Title Pt will obtain appropriate compression garment for long term management of lymphedema.    Baseline 03/27/21- pt is finding out if his insurance covers his garments    Time 4    Period Weeks    Status On-going      PT LONG TERM GOAL #3   Title Pt will report a 50% decrease in edema at anterior neck to decrease risk of infection.    Baseline 03/27/21- 40-50% decreased    Time 4    Period Weeks    Status Achieved                   Plan - 04/02/21 1550     Clinical Impression Statement Pt is still waiting to hear from his insurance company if his head and neck compression garments will be covered by his insurance. Pt is going to send a follow up email. Continued with MLD to anterior neck. As soon as pt finds out about coverage will proceed with obtaining appropriate head and neck garment.    PT Frequency 2x / week    PT Duration 4 weeks    PT Treatment/Interventions ADLs/Self Care Home Management;Patient/family education;Therapeutic exercise;Manual techniques;Manual lymph drainage;Orthotic Fit/Training;Compression bandaging;Taping;Vasopneumatic Device    PT Next Visit Plan review self MLD as needed, perform MLD anterior neck, see if heard back from [employer] regarding  coverage for jovipak, pt to be measured on Wed    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([place])  Problem List Patient Active Problem List   Diagnosis Date Noted   Encounter for dental examination and cleaning without abnormal findings 03/08/2021   Teeth missing 03/08/2021   Caries 03/08/2021   Defective dental restoration 03/08/2021   Excessive dental attrition 03/08/2021   Abfraction 03/08/2021   Diastema 03/08/2021   Malocclusion 03/08/2021   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   Incipient enamel caries 03/08/2021   Chronic periodontal disease 03/08/2021   Acquired lymphedema 01/11/2021   History of head and neck radiation 01/07/2021   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   Dysgeusia 01/07/2021   Accretions on teeth 01/07/2021   Pancytopenia, acquired ([place]) 12/07/2020   Dry mouth 12/07/2020   Leukopenia due to antineoplastic chemotherapy ([place]) 11/08/2020   Port-A-Cath in place 10/03/2020   Intractable hiccups 10/03/2020   Thrombocytopenia ([place]) 10/03/2020   Chemotherapy-induced fatigue 09/27/2020   Weight loss, unintentional 06/01/2020   Malnutrition of moderate degree 05/28/2020   GERD (gastroesophageal reflux disease)    Volume depletion    Hyperbilirubinemia    Hyponatremia    Mild protein-calorie malnutrition ([place])    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[place]) 05/17/2020   Mediastinal adenopathy 05/17/2020    Manus Gunning, PT 04/02/2021, 3:52 PM  [facility] @ [place], [place], [address] Phone:  [phone]   Fax:  [phone]  Name: Alexander [name] MRN: [id number] Date of Birth: 23-Jun-1960

## 2021-04-03 ENCOUNTER — Ambulatory Visit: Payer: PRIVATE HEALTH INSURANCE

## 2021-04-03 DIAGNOSIS — C109 Malignant neoplasm of oropharynx, unspecified: Secondary | ICD-10-CM | POA: Diagnosis present

## 2021-04-03 DIAGNOSIS — R293 Abnormal posture: Secondary | ICD-10-CM | POA: Diagnosis present

## 2021-04-03 DIAGNOSIS — I890 Lymphedema, not elsewhere classified: Secondary | ICD-10-CM | POA: Diagnosis not present

## 2021-04-03 NOTE — Therapy (Signed)
Progress Note Reporting Period 02/20/21 to 04/03/21   See note below for Objective Data and Assessment of Progress/Goals.      [facility] @ [address] [place], [place], [address] Phone: [phone]   Fax:  [phone]  Physical Therapy Treatment  Patient Details  Name: Alexander [name] MRN: [id number] Date of Birth: 05/15/1960 Referring Provider (PT): Alvy Bimler   Encounter Date: 04/03/2021   PT End of Session - 04/03/21 1103     Visit Number 10    Number of Visits 15    Date for PT Re-Evaluation 04/24/21    PT Start Time 1009    PT Stop Time 1102    PT Time Calculation (min) 53 min    Activity Tolerance Patient tolerated treatment well    Behavior During Therapy [facility] for tasks assessed/performed             Past Medical History:  Diagnosis Date   Asthma    Cancer ([place])    GERD (gastroesophageal reflux disease)    Sleep apnea    CPAP- does not wear    Past Surgical History:  Procedure Laterality Date   BRONCHIAL NEEDLE ASPIRATION BIOPSY  05/22/2020   Procedure: BRONCHIAL NEEDLE ASPIRATION BIOPSIES;  Surgeon: Garner Nash, DO;  Location: [place];  Service: Pulmonary;;   DIRECT LARYNGOSCOPY  05/09/2020   Procedure: DIRECT LARYNGOSCOPY;  Surgeon: Rozetta Nunnery, MD;  Location: [place];  Service: ENT;;   IR GASTROSTOMY TUBE MOD SED  09/13/2020   IR GASTROSTOMY TUBE REMOVAL  12/14/2020   IR IMAGING GUIDED PORT INSERTION  06/12/2020   MASS EXCISION N/A 05/09/2020   Procedure: EXCISION TONGUE MASS;  Surgeon: Rozetta Nunnery, MD;  Location: [place];  Service: ENT;  Laterality: N/A;   TONSILLECTOMY     TRACHEOSTOMY TUBE PLACEMENT  05/09/2020   Procedure: TRACHEOSTOMY;  Surgeon: Rozetta Nunnery, MD;  Location: [place];  Service: ENT;;   VIDEO BRONCHOSCOPY WITH ENDOBRONCHIAL ULTRASOUND N/A 05/22/2020   Procedure: VIDEO BRONCHOSCOPY WITH ENDOBRONCHIAL ULTRASOUND;  Surgeon: Garner Nash, DO;   Location: [place];  Service: Pulmonary;  Laterality: N/A;    There were no vitals filed for this visit.   Subjective Assessment - 04/03/21 1012     Subjective I'm going to have to submit the claim to [employer] utilization claims dept so they know I need the garment and from there I'll be able to pursue getting the garment. The lymphedema has improved some since start of care because I can tell the area of fullness isn't as wide as it was.    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 That portion of the mass had poorly defined margins but appeared to cross the midline and was estimated to measure at least 5.0 x 4.7 x 3.4 cm. The epiglottis and aryeglottic folds were not well separately delineated and may have been involved by mass. Tumor extension was suspected inferiorly at least to the level of the supraglottic larynx, 05/11/20 CT chest showed subcarinal mass/adenopathy and low lung volumes with bibasilar atelectasis. However, there was no acute intrathoracic pathology, 05/03/20 He saw Dr. [name] who performed a fiberoptic  laryngoscopy through the right nostril. Nasopharyngeal was clear, but the base of the tongue was full on the left side and epiglottis was slightly pushed back posteriorly with questionable ulcer on the left base of the tongue extending down to the left vallecular area, biopsy of the base of the tongue on 05/09/2020 revealed: invasive squamous cell carcinoma.  Strongly p16 positive; emergent tracheostomy was also performed at that time, received induction chemotherapy of 3 cycles of Carbo/5 FU, will now receive 35 fractions of radiation to his Base of tongue and bilateral  neck and 7 weeks of cisplatin chemotherapy. He will also receive radiation to his subcarinal node in his chest area. He started on 09/19/20 and will complete 11/07/20, PAC 06/12/20, PEG 09/13/20, Tracheostomy placed 05/09/20 and decannulated 08/31/20.    Patient Stated Goals to limit the accumulation of lymphatic fluid in my neck    Currently in Pain? No/denies                               [facility] Adult PT Treatment/Exercise - 04/03/21 0001       Manual Therapy   Manual Lymphatic Drainage (MLD) supine with head elevated: short neck, superfical and deep abdominals, bil shoulder collectors, bilateral axillary nodes, bilateral pectoral nodes, bilateral chest, short neck, anterior neck, submandibular nodes, posterior, lateral and anterior neck then retracing all steps                          PT Long Term Goals - 04/03/21 1115       PT LONG TERM GOAL #1   Title Pt will be independent in self MLD for long term management of lymphedema.    Baseline 03/27/21- pt is independent with this    Status Achieved      PT LONG TERM GOAL #2   Title Pt will obtain appropriate compression garment for long term management of lymphedema.    Baseline 03/27/21- pt is finding out if his insurance covers his garments; pt is continuing to work with his insurance to get what they need working for approval - 04/03/21    Status On-going      PT LONG TERM GOAL #3   Title Pt will report a 50% decrease in edema at anterior neck to decrease risk of infection.    Baseline 03/27/21- 40-50% decreased; at least 50% improved at this time as pt reports area of fullness is less widespread at anterior throat - 04/03/21    Status Achieved                   Plan - 04/03/21 1104     Clinical Impression Statement Pt is going to submit a claim to his utilization claims dept with [employer] for approval for his garments working to get this approved. He will then be able to submit for his garments.  Continued with MLD to head/neck area. Pt reports his area of fullness is not as widespread as it was at start of care meeting that goal. 1oth visit note sent to MD today per Medicare guidelines.    Rehab Potential Good    PT Frequency 2x / week    PT Duration 4 weeks    PT Treatment/Interventions ADLs/Self Care Home Management;Patient/family education;Therapeutic exercise;Manual techniques;Manual lymph drainage;Orthotic Fit/Training;Compression bandaging;Taping;Vasopneumatic Device    PT Next Visit Plan review self MLD as needed, perform MLD anterior neck, did pt hear back from  his utilization claim to [place]?    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[facility])     Problem List Patient Active Problem List   Diagnosis Date Noted   Encounter for dental examination and cleaning without abnormal findings 03/08/2021   Teeth missing 03/08/2021   Caries 03/08/2021   Defective dental restoration 03/08/2021   Excessive dental attrition 03/08/2021   Abfraction 03/08/2021   Diastema 03/08/2021   Malocclusion 03/08/2021   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   Incipient enamel caries 03/08/2021   Chronic periodontal disease 03/08/2021   Acquired lymphedema 01/11/2021   History of head and neck radiation 01/07/2021   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   Dysgeusia 01/07/2021   Accretions on teeth 01/07/2021   Pancytopenia, acquired ([place]) 12/07/2020   Dry mouth 12/07/2020   Leukopenia due to antineoplastic chemotherapy ([place]) 11/08/2020   Port-A-Cath in place 10/03/2020   Intractable hiccups 10/03/2020    Thrombocytopenia ([place]) 10/03/2020   Chemotherapy-induced fatigue 09/27/2020   Weight loss, unintentional 06/01/2020   Malnutrition of moderate degree 05/28/2020   GERD (gastroesophageal reflux disease)    Volume depletion    Hyperbilirubinemia    Hyponatremia    Mild protein-calorie malnutrition ([place])    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[place]) 05/17/2020   Mediastinal adenopathy 05/17/2020    Otelia Limes, PTA 04/03/2021, 12:16 PM  [name] @ [place], [place], [address] Phone: [phone]   Fax:  [phone]  Name: Alexander [name] MRN: [id number] Date of Birth: 01-04-1961

## 2021-04-08 ENCOUNTER — Encounter: Payer: Self-pay | Admitting: Hematology and Oncology

## 2021-04-09 ENCOUNTER — Encounter: Payer: Self-pay | Admitting: Physical Therapy

## 2021-04-09 ENCOUNTER — Other Ambulatory Visit: Payer: Self-pay

## 2021-04-09 ENCOUNTER — Ambulatory Visit: Payer: PRIVATE HEALTH INSURANCE | Admitting: Physical Therapy

## 2021-04-09 DIAGNOSIS — I890 Lymphedema, not elsewhere classified: Secondary | ICD-10-CM

## 2021-04-09 DIAGNOSIS — C109 Malignant neoplasm of oropharynx, unspecified: Secondary | ICD-10-CM

## 2021-04-09 DIAGNOSIS — R293 Abnormal posture: Secondary | ICD-10-CM

## 2021-04-09 NOTE — Therapy (Signed)
[place] @ [facility] [place] [place], [place], [address] Phone: [phone]   Fax:  [phone]  Physical Therapy Treatment  Patient Details  Name: Alexander [name] MRN: [id number] Date of Birth: 14-Jul-1960 Referring Provider (PT): Alvy Bimler   Encounter Date: 04/09/2021   PT End of Session - 04/09/21 1454     Visit Number 11    Number of Visits 15    Date for PT Re-Evaluation 04/24/21    PT Start Time 7616    PT Stop Time 1546    PT Time Calculation (min) 52 min    Activity Tolerance Patient tolerated treatment well    Behavior During Therapy [facility] for tasks assessed/performed             Past Medical History:  Diagnosis Date   Asthma    Cancer ([place])    GERD (gastroesophageal reflux disease)    Sleep apnea    CPAP- does not wear    Past Surgical History:  Procedure Laterality Date   BRONCHIAL NEEDLE ASPIRATION BIOPSY  05/22/2020   Procedure: BRONCHIAL NEEDLE ASPIRATION BIOPSIES;  Surgeon: Garner Nash, DO;  Location: [place];  Service: Pulmonary;;   DIRECT LARYNGOSCOPY  05/09/2020   Procedure: DIRECT LARYNGOSCOPY;  Surgeon: Rozetta Nunnery, MD;  Location: [place];  Service: ENT;;   IR GASTROSTOMY TUBE MOD SED  09/13/2020   IR GASTROSTOMY TUBE REMOVAL  12/14/2020   IR IMAGING GUIDED PORT INSERTION  06/12/2020   MASS EXCISION N/A 05/09/2020   Procedure: EXCISION TONGUE MASS;  Surgeon: Rozetta Nunnery, MD;  Location: [place];  Service: ENT;  Laterality: N/A;   TONSILLECTOMY     TRACHEOSTOMY TUBE PLACEMENT  05/09/2020   Procedure: TRACHEOSTOMY;  Surgeon: Rozetta Nunnery, MD;  Location: [place];  Service: ENT;;   VIDEO BRONCHOSCOPY WITH ENDOBRONCHIAL ULTRASOUND N/A 05/22/2020   Procedure: VIDEO BRONCHOSCOPY WITH ENDOBRONCHIAL ULTRASOUND;  Surgeon: Garner Nash, DO;  Location: [place];  Service: Pulmonary;  Laterality: N/A;    There were no vitals filed for this visit.   Subjective Assessment -  04/09/21 1556     Subjective My insurance company needs a letter of medical necessity.    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 That portion of the mass had poorly defined margins but appeared to cross the midline and was estimated to measure at least 5.0 x 4.7 x 3.4 cm. The epiglottis and aryeglottic folds were not well separately delineated and may have been involved by mass. Tumor extension was suspected inferiorly at least to the level of the supraglottic larynx, 05/11/20 CT chest showed subcarinal mass/adenopathy and low lung volumes with bibasilar atelectasis. However, there was no acute intrathoracic pathology, 05/03/20 He saw Dr. [name] who performed a fiberoptic laryngoscopy through the right nostril. Nasopharyngeal was clear, but the base of the tongue was full on the left side and epiglottis was slightly pushed back posteriorly with questionable ulcer on the left base of the tongue extending down to the left vallecular area, biopsy of the base of the tongue on 05/09/2020 revealed: invasive squamous cell carcinoma.  Strongly p16 positive; emergent tracheostomy was also performed at that  time, received induction chemotherapy of 3 cycles of Carbo/5 FU, will now receive 35 fractions of radiation to his Base of tongue and bilateral neck and 7 weeks of cisplatin chemotherapy. He will also receive radiation to his subcarinal node in his chest area. He started on 09/19/20 and will complete 11/07/20, PAC 06/12/20, PEG 09/13/20, Tracheostomy placed 05/09/20 and decannulated 08/31/20.    Patient Stated Goals to limit the accumulation of lymphatic fluid in my neck    Currently in Pain? No/denies    Pain  Score 0-No pain                               [facility] Adult PT Treatment/Exercise - 04/09/21 0001       Manual Therapy   Edema Management wrote letter of medical necessity and sent that including script to pt's insurance company as they requested. Educated pt about two different options for garments including solaris head and neck wrap and the jovi pak which would be good options for management of his edema    Manual Lymphatic Drainage (MLD) supine with head elevated: short neck, superfical and deep abdominals, bil shoulder collectors, bilateral axillary nodes, bilateral pectoral nodes, bilateral chest, short neck, anterior neck, submandibular nodes, posterior, lateral and anterior neck then retracing all steps                          PT Long Term Goals - 04/03/21 1115       PT LONG TERM GOAL #1   Title Pt will be independent in self MLD for long term management of lymphedema.    Baseline 03/27/21- pt is independent with this    Status Achieved      PT LONG TERM GOAL #2   Title Pt will obtain appropriate compression garment for long term management of lymphedema.    Baseline 03/27/21- pt is finding out if his insurance covers his garments; pt is continuing to work with his insurance to get what they need working for approval - 04/03/21    Status On-going      PT LONG TERM GOAL #3   Title Pt will report a 50% decrease in edema at anterior neck to decrease risk of infection.    Baseline 03/27/21- 40-50% decreased; at least 50% improved at this time as pt reports area of fullness is less widespread at anterior throat - 04/03/21    Status Achieved                   Plan - 04/09/21 1555     Clinical Impression Statement Discussed need for compression garments and pt reports his insurance company requires additional info including a letter of medical necessity. Wrote letter and sent that and script for garment to [employer]. Continued with  MLD today. Once pt recieves his compression garment he will be able to independently manage his lymphedema.    PT Frequency 2x / week    PT Duration 4 weeks    PT Treatment/Interventions ADLs/Self Care Home Management;Patient/family education;Therapeutic exercise;Manual techniques;Manual lymph drainage;Orthotic Fit/Training;Compression bandaging;Taping;Vasopneumatic Device    PT Next Visit Plan review self MLD as needed, perform MLD anterior neck, did pt hear back from his utilization claim to [place]?    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[facility])     Problem List Patient Active Problem List   Diagnosis Date Noted   Encounter for dental examination and cleaning without abnormal findings 03/08/2021   Teeth missing 03/08/2021   Caries 03/08/2021   Defective dental restoration 03/08/2021   Excessive dental attrition 03/08/2021   Abfraction 03/08/2021   Diastema 03/08/2021   Malocclusion 03/08/2021   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   Incipient enamel caries 03/08/2021   Chronic periodontal disease 03/08/2021   Acquired lymphedema 01/11/2021   History of head and neck radiation 01/07/2021   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   Dysgeusia 01/07/2021   Accretions on teeth 01/07/2021   Pancytopenia, acquired ([place]) 12/07/2020   Dry mouth 12/07/2020   Leukopenia due to antineoplastic chemotherapy ([place]) 11/08/2020   Port-A-Cath in place 10/03/2020   Intractable hiccups 10/03/2020   Thrombocytopenia ([place]) 10/03/2020   Chemotherapy-induced fatigue 09/27/2020   Weight loss, unintentional 06/01/2020    Malnutrition of moderate degree 05/28/2020   GERD (gastroesophageal reflux disease)    Volume depletion    Hyperbilirubinemia    Hyponatremia    Mild protein-calorie malnutrition ([place])    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[place]) 05/17/2020   Mediastinal adenopathy 05/17/2020    Manus Gunning, PT 04/09/2021, 3:57 PM  [place] @ [address] [place], [place], [address] Phone: [phone]   Fax:  [phone]  Name: Alexander [name] MRN: [id number] Date of Birth: 01-05-1961

## 2021-04-10 ENCOUNTER — Encounter (HOSPITAL_COMMUNITY): Payer: PRIVATE HEALTH INSURANCE | Admitting: Dentistry

## 2021-04-10 ENCOUNTER — Encounter: Payer: Self-pay | Admitting: Hematology and Oncology

## 2021-04-11 ENCOUNTER — Encounter: Payer: Self-pay | Admitting: Physical Therapy

## 2021-04-11 ENCOUNTER — Other Ambulatory Visit: Payer: Self-pay

## 2021-04-11 ENCOUNTER — Ambulatory Visit: Payer: PRIVATE HEALTH INSURANCE | Admitting: Physical Therapy

## 2021-04-11 DIAGNOSIS — I890 Lymphedema, not elsewhere classified: Secondary | ICD-10-CM | POA: Diagnosis not present

## 2021-04-11 DIAGNOSIS — C109 Malignant neoplasm of oropharynx, unspecified: Secondary | ICD-10-CM

## 2021-04-11 DIAGNOSIS — R293 Abnormal posture: Secondary | ICD-10-CM

## 2021-04-11 NOTE — Therapy (Signed)
[facility] @ [place], [place], [address] Phone: [phone]   Fax:  [phone]  Physical Therapy Treatment  Patient Details  Name: Alexander [name] MRN: [id number] Date of Birth: Dec 21, 1960 Referring Provider (PT): Alvy Bimler   Encounter Date: 04/11/2021   PT End of Session - 04/11/21 1548     Visit Number 12    Number of Visits 15    Date for PT Re-Evaluation 04/24/21    PT Start Time 1502    PT Stop Time 1545    PT Time Calculation (min) 43 min    Activity Tolerance Patient tolerated treatment well    Behavior During Therapy [facility] for tasks assessed/performed             Past Medical History:  Diagnosis Date   Asthma    Cancer ([place])    GERD (gastroesophageal reflux disease)    Sleep apnea    CPAP- does not wear    Past Surgical History:  Procedure Laterality Date   BRONCHIAL NEEDLE ASPIRATION BIOPSY  05/22/2020   Procedure: BRONCHIAL NEEDLE ASPIRATION BIOPSIES;  Surgeon: Garner Nash, DO;  Location: [facility];  Service: Pulmonary;;   DIRECT LARYNGOSCOPY  05/09/2020   Procedure: DIRECT LARYNGOSCOPY;  Surgeon: Rozetta Nunnery, MD;  Location: [facility];  Service: ENT;;   IR GASTROSTOMY TUBE MOD SED  09/13/2020   IR GASTROSTOMY TUBE REMOVAL  12/14/2020   IR IMAGING GUIDED PORT INSERTION  06/12/2020   MASS EXCISION N/A 05/09/2020   Procedure: EXCISION TONGUE MASS;  Surgeon: Rozetta Nunnery, MD;  Location: [facility];  Service: ENT;  Laterality: N/A;   TONSILLECTOMY     TRACHEOSTOMY TUBE PLACEMENT  05/09/2020   Procedure: TRACHEOSTOMY;  Surgeon: Rozetta Nunnery, MD;  Location: [facility];  Service: ENT;;   VIDEO BRONCHOSCOPY WITH ENDOBRONCHIAL ULTRASOUND N/A 05/22/2020   Procedure: VIDEO BRONCHOSCOPY WITH ENDOBRONCHIAL ULTRASOUND;  Surgeon: Garner Nash, DO;  Location: [facility];  Service: Pulmonary;  Laterality: N/A;    There were no vitals filed for this visit.   Subjective Assessment -  04/11/21 1549     Subjective I have not heard back from my insurance company.    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 That portion of the mass had poorly defined margins but appeared to cross the midline and was estimated to measure at least 5.0 x 4.7 x 3.4 cm. The epiglottis and aryeglottic folds were not well separately delineated and may have been involved by mass. Tumor extension was suspected inferiorly at least to the level of the supraglottic larynx, 05/11/20 CT chest showed subcarinal mass/adenopathy and low lung volumes with bibasilar atelectasis. However, there was no acute intrathoracic pathology, 05/03/20 He saw Dr. [name] who performed a fiberoptic laryngoscopy through the right nostril. Nasopharyngeal was clear, but the base of the tongue was full on the left side and epiglottis was slightly pushed back posteriorly with questionable ulcer on the left base of the tongue extending down to the left vallecular area, biopsy of the base of the tongue on 05/09/2020 revealed: invasive squamous cell carcinoma.  Strongly p16 positive; emergent tracheostomy was also performed at that  time, received induction chemotherapy of 3 cycles of Carbo/5 FU, will now receive 35 fractions of radiation to his Base of tongue and bilateral neck and 7 weeks of cisplatin chemotherapy. He will also receive radiation to his subcarinal node in his chest area. He started on 09/19/20 and will complete 11/07/20, PAC 06/12/20, PEG 09/13/20, Tracheostomy placed 05/09/20 and decannulated 08/31/20.    Patient Stated Goals to limit the accumulation of lymphatic fluid in my neck    Currently in Pain? No/denies    Pain Score 0-No  pain                               [facility] Adult PT Treatment/Exercise - 04/11/21 0001       Manual Therapy   Manual Lymphatic Drainage (MLD) supine with head elevated: short neck, superfical and deep abdominals, bil shoulder collectors, bilateral axillary nodes, bilateral pectoral nodes, bilateral chest, short neck, anterior neck, submandibular nodes, posterior, lateral and anterior neck then retracing all steps                          PT Long Term Goals - 04/03/21 1115       PT LONG TERM GOAL #1   Title Pt will be independent in self MLD for long term management of lymphedema.    Baseline 03/27/21- pt is independent with this    Status Achieved      PT LONG TERM GOAL #2   Title Pt will obtain appropriate compression garment for long term management of lymphedema.    Baseline 03/27/21- pt is finding out if his insurance covers his garments; pt is continuing to work with his insurance to get what they need working for approval - 04/03/21    Status On-going      PT LONG TERM GOAL #3   Title Pt will report a 50% decrease in edema at anterior neck to decrease risk of infection.    Baseline 03/27/21- 40-50% decreased; at least 50% improved at this time as pt reports area of fullness is less widespread at anterior throat - 04/03/21    Status Achieved                   Plan - 04/11/21 1549     Clinical Impression Statement Pt still waiting to see if his insurance garment will approve his head and neck garment. This therapist faxed over a letter of medical necessity since he will need that for long term management of lymphedema. Continued with MLD to anterior neck today.    PT Frequency 2x / week    PT Duration 4 weeks    PT Treatment/Interventions ADLs/Self Care Home Management;Patient/family education;Therapeutic exercise;Manual techniques;Manual lymph drainage;Orthotic Fit/Training;Compression bandaging;Taping;Vasopneumatic Device    PT Next Visit  Plan review self MLD as needed, perform MLD anterior neck, did pt hear back from his utilization claim to [place]?    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[facility])     Problem List Patient Active Problem List   Diagnosis Date  Noted   Encounter for dental examination and cleaning without abnormal findings 03/08/2021   Teeth missing 03/08/2021   Caries 03/08/2021   Defective dental restoration 03/08/2021   Excessive dental attrition 03/08/2021   Abfraction 03/08/2021   Diastema 03/08/2021   Malocclusion 03/08/2021   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   Incipient enamel caries 03/08/2021   Chronic periodontal disease 03/08/2021   Acquired lymphedema 01/11/2021   History of head and neck radiation 01/07/2021   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   Dysgeusia 01/07/2021   Accretions on teeth 01/07/2021   Pancytopenia, acquired ([place]) 12/07/2020   Dry mouth 12/07/2020   Leukopenia due to antineoplastic chemotherapy ([place]) 11/08/2020   Port-A-Cath in place 10/03/2020   Intractable hiccups 10/03/2020   Thrombocytopenia ([place]) 10/03/2020   Chemotherapy-induced fatigue 09/27/2020   Weight loss, unintentional 06/01/2020   Malnutrition of moderate degree 05/28/2020   GERD (gastroesophageal reflux disease)    Volume depletion    Hyperbilirubinemia    Hyponatremia    Mild protein-calorie malnutrition ([place])    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[place]) 05/17/2020   Mediastinal adenopathy 05/17/2020    Manus Gunning, PT 04/11/2021, 3:50 PM  [facility] @  [place], [place], [address] Phone: [phone]   Fax:  [phone]  Name: Alexander [name] MRN: [id number] Date of Birth: 1960/10/28

## 2021-04-15 ENCOUNTER — Encounter (HOSPITAL_COMMUNITY): Payer: PRIVATE HEALTH INSURANCE | Admitting: Dentistry

## 2021-04-16 ENCOUNTER — Ambulatory Visit: Payer: PRIVATE HEALTH INSURANCE | Admitting: Rehabilitation

## 2021-04-16 ENCOUNTER — Encounter: Payer: Self-pay | Admitting: Rehabilitation

## 2021-04-16 ENCOUNTER — Other Ambulatory Visit: Payer: Self-pay

## 2021-04-16 DIAGNOSIS — I890 Lymphedema, not elsewhere classified: Secondary | ICD-10-CM | POA: Diagnosis not present

## 2021-04-16 DIAGNOSIS — R293 Abnormal posture: Secondary | ICD-10-CM

## 2021-04-16 NOTE — Therapy (Signed)
[facility] @ [place], [place], [address] Phone: [phone]   Fax:  [phone]  Physical Therapy Treatment  Patient Details  Name: Alexander [name] MRN: [id number] Date of Birth: February 19, 1960 Referring Provider (PT): Alvy Bimler   Encounter Date: 04/16/2021   PT End of Session - 04/16/21 1506     Visit Number 13    Number of Visits 15    Date for PT Re-Evaluation 04/24/21    PT Start Time 6144    PT Stop Time 1346    PT Time Calculation (min) 38 min    Activity Tolerance Patient tolerated treatment well    Behavior During Therapy [facility] for tasks assessed/performed             Past Medical History:  Diagnosis Date   Asthma    Cancer ([place])    GERD (gastroesophageal reflux disease)    Sleep apnea    CPAP- does not wear    Past Surgical History:  Procedure Laterality Date   BRONCHIAL NEEDLE ASPIRATION BIOPSY  05/22/2020   Procedure: BRONCHIAL NEEDLE ASPIRATION BIOPSIES;  Surgeon: Garner Nash, DO;  Location: [facility];  Service: Pulmonary;;   DIRECT LARYNGOSCOPY  05/09/2020   Procedure: DIRECT LARYNGOSCOPY;  Surgeon: Rozetta Nunnery, MD;  Location: [facility];  Service: ENT;;   IR GASTROSTOMY TUBE MOD SED  09/13/2020   IR GASTROSTOMY TUBE REMOVAL  12/14/2020   IR IMAGING GUIDED PORT INSERTION  06/12/2020   MASS EXCISION N/A 05/09/2020   Procedure: EXCISION TONGUE MASS;  Surgeon: Rozetta Nunnery, MD;  Location: [facility];  Service: ENT;  Laterality: N/A;   TONSILLECTOMY     TRACHEOSTOMY TUBE PLACEMENT  05/09/2020   Procedure: TRACHEOSTOMY;  Surgeon: Rozetta Nunnery, MD;  Location: [facility];  Service: ENT;;   VIDEO BRONCHOSCOPY WITH ENDOBRONCHIAL ULTRASOUND N/A 05/22/2020   Procedure: VIDEO BRONCHOSCOPY WITH ENDOBRONCHIAL ULTRASOUND;  Surgeon: Garner Nash, DO;  Location: [facility];  Service: Pulmonary;  Laterality: N/A;    There were no vitals filed for this visit.   Subjective Assessment -  04/16/21 1549     Subjective I have not heard back from my insurance company.    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 That portion of the mass had poorly defined margins but appeared to cross the midline and was estimated to measure at least 5.0 x 4.7 x 3.4 cm. The epiglottis and aryeglottic folds were not well separately delineated and may have been involved by mass. Tumor extension was suspected inferiorly at least to the level of the supraglottic larynx, 05/11/20 CT chest showed subcarinal mass/adenopathy and low lung volumes with bibasilar atelectasis. However, there was no acute intrathoracic pathology, 05/03/20 He saw Dr. [name] who performed a fiberoptic laryngoscopy through the right nostril. Nasopharyngeal was clear, but the base of the tongue was full on the left side and epiglottis was slightly pushed back posteriorly with questionable ulcer on the left base of the tongue extending down to the left vallecular area, biopsy of the base of the tongue on 05/09/2020 revealed: invasive squamous cell carcinoma.  Strongly p16 positive; emergent tracheostomy was also performed at that  time, received induction chemotherapy of 3 cycles of Carbo/5 FU, will now receive 35 fractions of radiation to his Base of tongue and bilateral neck and 7 weeks of cisplatin chemotherapy. He will also receive radiation to his subcarinal node in his chest area. He started on 09/19/20 and will complete 11/07/20, PAC 06/12/20, PEG 09/13/20, Tracheostomy placed 05/09/20 and decannulated 08/31/20.    Currently in Pain? No/denies                               [facility] Adult PT Treatment/Exercise - 04/16/21  0001       Manual Therapy   Manual Lymphatic Drainage (MLD) supine with head elevated: short neck, superfical and deep abdominals, bil shoulder collectors, bilateral axillary nodes, bilateral pectoral nodes, bilateral chest, short neck, anterior neck, submandibular nodes, posterior, lateral and anterior neck then retracing all steps                          PT Long Term Goals - 04/03/21 1115       PT LONG TERM GOAL #1   Title Pt will be independent in self MLD for long term management of lymphedema.    Baseline 03/27/21- pt is independent with this    Status Achieved      PT LONG TERM GOAL #2   Title Pt will obtain appropriate compression garment for long term management of lymphedema.    Baseline 03/27/21- pt is finding out if his insurance covers his garments; pt is continuing to work with his insurance to get what they need working for approval - 04/03/21    Status On-going      PT LONG TERM GOAL #3   Title Pt will report a 50% decrease in edema at anterior neck to decrease risk of infection.    Baseline 03/27/21- 40-50% decreased; at least 50% improved at this time as pt reports area of fullness is less widespread at anterior throat - 04/03/21    Status Achieved                    Patient will benefit from skilled therapeutic intervention in order to improve the following deficits and impairments:     Visit Diagnosis: Lymphedema, not elsewhere classified  Abnormal posture     Problem List Patient Active Problem List   Diagnosis Date Noted   Encounter for dental examination and cleaning without abnormal findings 03/08/2021   Teeth missing 03/08/2021   Caries 03/08/2021   Defective dental restoration 03/08/2021   Excessive dental attrition 03/08/2021   Abfraction 03/08/2021   Diastema 03/08/2021   Malocclusion 03/08/2021   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   Incipient enamel caries 03/08/2021   Chronic periodontal disease 03/08/2021   Acquired  lymphedema 01/11/2021   History of head and neck radiation 01/07/2021   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   Dysgeusia 01/07/2021   Accretions on teeth 01/07/2021   Pancytopenia, acquired ([place]) 12/07/2020   Dry mouth 12/07/2020   Leukopenia due to antineoplastic chemotherapy ([place]) 11/08/2020   Port-A-Cath in place 10/03/2020   Intractable hiccups 10/03/2020   Thrombocytopenia ([place]) 10/03/2020   Chemotherapy-induced fatigue 09/27/2020   Weight loss, unintentional 06/01/2020   Malnutrition of moderate degree 05/28/2020   GERD (gastroesophageal reflux disease)    Volume depletion    Hyperbilirubinemia    Hyponatremia    Mild protein-calorie malnutrition ([facility])    Squamous cell carcinoma of  base of tongue ([place]) 05/17/2020   Mediastinal adenopathy 05/17/2020    Stark Bray, PT 04/16/2021, 3:49 PM  [facility] @ [place], [place], [address] Phone: [phone]   Fax:  [phone]  Name: Tanveer Brammer MRN: [id number] Date of Birth: [birth year]

## 2021-04-17 ENCOUNTER — Ambulatory Visit: Payer: PRIVATE HEALTH INSURANCE | Attending: Hematology and Oncology

## 2021-04-17 DIAGNOSIS — C109 Malignant neoplasm of oropharynx, unspecified: Secondary | ICD-10-CM | POA: Diagnosis present

## 2021-04-17 DIAGNOSIS — R1313 Dysphagia, pharyngeal phase: Secondary | ICD-10-CM | POA: Diagnosis not present

## 2021-04-17 DIAGNOSIS — R293 Abnormal posture: Secondary | ICD-10-CM | POA: Diagnosis present

## 2021-04-17 DIAGNOSIS — I890 Lymphedema, not elsewhere classified: Secondary | ICD-10-CM | POA: Diagnosis present

## 2021-04-17 NOTE — Therapy (Signed)
[facility] [address], [place], [place], [address] Phone: [phone]   Fax:  [phone]  Speech Language Pathology Treatment/discharge summary  Patient Details  Name: Alexander [name] MRN: [id number] Date of Birth: 1960-08-29 Referring Provider (SLP): Eppie Gibson   Encounter Date: 04/17/2021   End of Session - 04/17/21 1658     Visit Number 6    Number of Visits 6    Date for SLP Re-Evaluation 04/18/21    SLP Start Time 1537    SLP Stop Time  1615    SLP Time Calculation (min) 38 min    Activity Tolerance Patient tolerated treatment well             Past Medical History:  Diagnosis Date   Asthma    Cancer ([place])    GERD (gastroesophageal reflux disease)    Sleep apnea    CPAP- does not wear    Past Surgical History:  Procedure Laterality Date   BRONCHIAL NEEDLE ASPIRATION BIOPSY  05/22/2020   Procedure: BRONCHIAL NEEDLE ASPIRATION BIOPSIES;  Surgeon: Garner Nash, DO;  Location: [facility];  Service: Pulmonary;;   DIRECT LARYNGOSCOPY  05/09/2020   Procedure: DIRECT LARYNGOSCOPY;  Surgeon: Rozetta Nunnery, MD;  Location: [facility];  Service: ENT;;   IR GASTROSTOMY TUBE MOD SED  09/13/2020   IR GASTROSTOMY TUBE REMOVAL  12/14/2020   IR IMAGING GUIDED PORT INSERTION  06/12/2020   MASS EXCISION N/A 05/09/2020   Procedure: EXCISION TONGUE MASS;  Surgeon: Rozetta Nunnery, MD;  Location: [facility];  Service: ENT;  Laterality: N/A;   TONSILLECTOMY     TRACHEOSTOMY TUBE PLACEMENT  05/09/2020   Procedure: TRACHEOSTOMY;  Surgeon: Rozetta Nunnery, MD;  Location: [facility];  Service: ENT;;   VIDEO BRONCHOSCOPY WITH ENDOBRONCHIAL ULTRASOUND N/A 05/22/2020   Procedure: VIDEO BRONCHOSCOPY WITH ENDOBRONCHIAL ULTRASOUND;  Surgeon: Garner Nash, DO;  Location: [facility];  Service: Pulmonary;  Laterality: N/A;   SPEECH THERAPY DISCHARGE SUMMARY  Visits from Start of Care: 6  Current functional level related to goals  / functional outcomes: See below   Remaining deficits: None noted.   Education / Equipment: Rationale for HEP, s/sx aspiration PNA, late effects head/neck radiation on swallow, who to contact should he have swallowing difficulty from this time on, should do exercises for at least 2-4 more weeks and then start doing them x2-3/week.   Patient agrees to discharge. Patient goals were partially met. Patient is being discharged due to being pleased with the current functional level..      There were no vitals filed for this visit.   Subjective Assessment - 04/17/21 1540     Subjective Pt is seeing PT x2/week for lymphedema. Pt indicated to SLP he stopped doing HEP due to swallowing well with meals.    Currently in Pain? No/denies                   ADULT SLP TREATMENT - 04/17/21 1545       General Information   Behavior/Cognition Alert;Cooperative;Pleasant mood      Treatment Provided   Treatment provided Dysphagia      Dysphagia Treatment   Temperature Spikes Noted No    Respiratory Status Room air    Oral Cavity - Dentition Adequate natural dentition    Treatment Methods Skilled observation;Therapeutic exercise;Patient/caregiver education    Type of PO's observed Thin liquids;Dysphagia 3 (soft)    Oral Phase Signs & Symptoms Other (comment)  none noted   Pharyngeal Phase Signs & Symptoms Other (comment)   none noted   Other treatment/comments Whopper with cheese, toast with eggs past few days, oreo cookies. Pt reports needing water but no overt s/sx aspiration upon direct questioning. SLP told pt who to contact if he should have difficulty swallowing and explained the s/sx difficulty swallowing to pt. He demo'd understanding. He completed HEP with modified independence. [place] was provided with overt s/sx aspiration PNA today. He agrees with SLP about d/c today from [place].      Assessment / Recommendations / Plan   Plan --   d/c - pt agrees     Progression Toward Goals    Progression toward goals --   cont'd decr'd compliance with frequency and scope of HEP             SLP Education - 04/17/21 1656     Education Details rationale for HEP, overt s/sx aspiration PNA, see daily note for more details    Person(s) Educated Patient    Methods Explanation    Comprehension Verbalized understanding            SLP Short Term Goals -  04/17/2021 0001        SLP SHORT TERM GOAL #1      Title pt will complete HEP with rare min A    Time      Period --   vists, for all STGs    Status Not met            SLP SHORT TERM GOAL #2    Title pt will tell SLP why pt is completing HEP with modified independence    Time 1    Status Achieved           SLP SHORT TERM GOAL #3    Title pt will describe 3 overt s/s aspiration PNA with modified independence    Time 1    Status Not met - added to [place] #4    Title pt will tell SLP how a food journal could hasten return to a more normalized diet    Time 1    Status Deferred                     SLP Long Term Goals  04/17/2021 0001                    SLP LONG TERM GOAL #1    Title pt will complete HEP with modified independence over two visits    Time 2       02-21-21, 04-17-21    Period --   visits, for all LTGs    Status Achieved             SLP LONG TERM GOAL #2    Title pt will describe how to modify HEP over time, and the timeline associated with reduction in HEP frequency with modified independence over two sessions    Time 2    04-17-21    Status  Partially met         SLP LONG TERM GOAL #3  Title pt will describe 3 overt s/sx aspiration PNA  Time 04-17-21  Period    Status Achieved                     Plan - 04/17/2021 0001  Clinical Impression Statement At this time pt swallowing is deemed WNL with dys III and thin liquids, however pt also reports he has remained without oral stage deficits and overt s/sx aspiration with regular diet items as well; No  overt s/sx aspiration PNA reported by pt or observed by SLP today. SLP reviewed pt's individualized HEP for dysphagia and pt completed each exercise on their own with modified independence. Pt reports stopping completion of HEP 1-2 weeks ago. Prior to that, he was not consistent with completing HEP scope or frequency  as prescribed by SLP. Data indicate that pt's swallow ability will likely decrease over the course of radiation/chemotherapy and could very well decline over time following conclusion of their radiation therapy due to muscle disuse atrophy and/or muscle fibrosis and pt demonstrated understanding of that today. Pt agrees with d/c today.               Treatment/Interventions Aspiration precaution training;Pharyngeal strengthening exercises;Diet toleration management by SLP;Trials of upgraded texture/liquids;Patient/family education;SLP instruction and feedback;Compensatory techniques    Potential to Achieve Goals Good           Patient will benefit from skilled therapeutic intervention in order to improve the following deficits and impairments:   Dysphagia, pharyngeal phase    Problem List Patient Active Problem List   Diagnosis Date Noted   Encounter for dental examination and cleaning without abnormal findings 03/08/2021   Teeth missing 03/08/2021   Caries 03/08/2021   Defective dental restoration 03/08/2021   Excessive dental attrition 03/08/2021   Abfraction 03/08/2021   Diastema 03/08/2021   Malocclusion 03/08/2021   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   Incipient enamel caries 03/08/2021   Chronic periodontal disease 03/08/2021   Acquired lymphedema 01/11/2021   History of head and neck radiation 01/07/2021   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   Dysgeusia 01/07/2021   Accretions on teeth 01/07/2021   Pancytopenia, acquired ([place]) 12/07/2020   Dry mouth 12/07/2020   Leukopenia due to antineoplastic chemotherapy ([place]) 11/08/2020   Port-A-Cath in place  10/03/2020   Intractable hiccups 10/03/2020   Thrombocytopenia ([place]) 10/03/2020   Chemotherapy-induced fatigue 09/27/2020   Weight loss, unintentional 06/01/2020   Malnutrition of moderate degree 05/28/2020   GERD (gastroesophageal reflux disease)    Volume depletion    Hyperbilirubinemia    Hyponatremia    Mild protein-calorie malnutrition (H[facility])    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[place]) 05/17/2020   Mediastinal adenopathy 05/17/2020    Jeren Dufrane, [place] 04/17/2021, 5:11 PM  [facility] [address] [place], [place], [address] Phone: [phone]   Fax:  [phone]   Name: Toretto Tingler MRN: [id number] Date of Birth: 11-13-1960

## 2021-04-17 NOTE — Patient Instructions (Signed)
   Signs of Aspiration Pneumonia   Chest pain/tightness Fever (can be low grade) Cough  With foul-smelling phlegm (sputum) With sputum containing pus or blood With greenish sputum Fatigue  Shortness of breath  Wheezing   **IF YOU HAVE THESE SIGNS, CONTACT YOUR DOCTOR OR GO TO THE EMERGENCY DEPARTMENT OR URGENT CARE AS SOON AS POSSIBLE**     

## 2021-04-18 ENCOUNTER — Encounter: Payer: Self-pay | Admitting: Physical Therapy

## 2021-04-18 ENCOUNTER — Other Ambulatory Visit: Payer: Self-pay

## 2021-04-18 ENCOUNTER — Ambulatory Visit: Payer: PRIVATE HEALTH INSURANCE | Admitting: Physical Therapy

## 2021-04-18 DIAGNOSIS — C109 Malignant neoplasm of oropharynx, unspecified: Secondary | ICD-10-CM

## 2021-04-18 DIAGNOSIS — I890 Lymphedema, not elsewhere classified: Secondary | ICD-10-CM

## 2021-04-18 DIAGNOSIS — R1313 Dysphagia, pharyngeal phase: Secondary | ICD-10-CM | POA: Diagnosis not present

## 2021-04-18 DIAGNOSIS — R293 Abnormal posture: Secondary | ICD-10-CM

## 2021-04-18 NOTE — Therapy (Signed)
[facility] @ [address] [place], [place], [address] Phone: [phone]   Fax:  [phone]  Physical Therapy Treatment  Patient Details  Name: Alexander [name] MRN: [id number] Date of Birth: 05/26/1960 Referring Provider (PT): Alvy Bimler   Encounter Date: 04/18/2021   PT End of Session - 04/18/21 1456     Visit Number 14    Number of Visits 15    Date for PT Re-Evaluation 04/24/21    PT Start Time 4496    PT Stop Time 1555    PT Time Calculation (min) 60 min    Activity Tolerance Patient tolerated treatment well    Behavior During Therapy [facility] for tasks assessed/performed             Past Medical History:  Diagnosis Date   Asthma    Cancer ([place])    GERD (gastroesophageal reflux disease)    Sleep apnea    CPAP- does not wear    Past Surgical History:  Procedure Laterality Date   BRONCHIAL NEEDLE ASPIRATION BIOPSY  05/22/2020   Procedure: BRONCHIAL NEEDLE ASPIRATION BIOPSIES;  Surgeon: Garner Nash, DO;  Location: [place];  Service: Pulmonary;;   DIRECT LARYNGOSCOPY  05/09/2020   Procedure: DIRECT LARYNGOSCOPY;  Surgeon: Rozetta Nunnery, MD;  Location: [place];  Service: ENT;;   IR GASTROSTOMY TUBE MOD SED  09/13/2020   IR GASTROSTOMY TUBE REMOVAL  12/14/2020   IR IMAGING GUIDED PORT INSERTION  06/12/2020   MASS EXCISION N/A 05/09/2020   Procedure: EXCISION TONGUE MASS;  Surgeon: Rozetta Nunnery, MD;  Location: [place];  Service: ENT;  Laterality: N/A;   TONSILLECTOMY     TRACHEOSTOMY TUBE PLACEMENT  05/09/2020   Procedure: TRACHEOSTOMY;  Surgeon: Rozetta Nunnery, MD;  Location: [place];  Service: ENT;;   VIDEO BRONCHOSCOPY WITH ENDOBRONCHIAL ULTRASOUND N/A 05/22/2020   Procedure: VIDEO BRONCHOSCOPY WITH ENDOBRONCHIAL ULTRASOUND;  Surgeon: Garner Nash, DO;  Location: [place];  Service: Pulmonary;  Laterality: N/A;    There were no vitals filed for this visit.   Subjective Assessment -  04/18/21 1558     Subjective I have not heard anything.    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 That portion of the mass had poorly defined margins but appeared to cross the midline and was estimated to measure at least 5.0 x 4.7 x 3.4 cm. The epiglottis and aryeglottic folds were not well separately delineated and may have been involved by mass. Tumor extension was suspected inferiorly at least to the level of the supraglottic larynx, 05/11/20 CT chest showed subcarinal mass/adenopathy and low lung volumes with bibasilar atelectasis. However, there was no acute intrathoracic pathology, 05/03/20 He saw Dr. [name] who performed a fiberoptic laryngoscopy through the right nostril. Nasopharyngeal was clear, but the base of the tongue was full on the left side and epiglottis was slightly pushed back posteriorly with questionable ulcer on the left base of the tongue extending down to the left vallecular area, biopsy of the base of the tongue on 05/09/2020 revealed: invasive squamous cell carcinoma.  Strongly p16 positive; emergent tracheostomy was also performed at that time, received induction chemotherapy  of 3 cycles of Carbo/5 FU, will now receive 35 fractions of radiation to his Base of tongue and bilateral neck and 7 weeks of cisplatin chemotherapy. He will also receive radiation to his subcarinal node in his chest area. He started on 09/19/20 and will complete 11/07/20, PAC 06/12/20, PEG 09/13/20, Tracheostomy placed 05/09/20 and decannulated 08/31/20.    Patient Stated Goals to limit the accumulation of lymphatic fluid in my neck    Currently in Pain? No/denies    Pain Score 0-No pain                    LYMPHEDEMA/ONCOLOGY QUESTIONNAIRE - 04/18/21 0001       Head and Neck   4 cm superior to sternal notch around neck 43 cm (P)     6 cm superior to sternal notch around neck 44 cm (P)     8 cm superior to sternal notch around neck 45 cm (P)                         [facility] Adult PT Treatment/Exercise - 04/18/21 0001       Manual Therapy   Manual Lymphatic Drainage (MLD) supine with head elevated: short neck, superfical and deep abdominals, bil shoulder collectors, bilateral axillary nodes, bilateral pectoral nodes, bilateral chest, short neck, anterior neck, submandibular nodes, posterior, lateral and anterior neck then retracing all steps                          PT Long Term Goals - 04/03/21 1115       PT LONG TERM GOAL #1   Title Pt will be independent in self MLD for long term management of lymphedema.    Baseline 03/27/21- pt is independent with this    Status Achieved      PT LONG TERM GOAL #2   Title Pt will obtain appropriate compression garment for long term management of lymphedema.    Baseline 03/27/21- pt is finding out if his insurance covers his garments; pt is continuing to work with his insurance to get what they need working for approval - 04/03/21    Status On-going      PT LONG TERM GOAL #3   Title Pt will report a 50% decrease in edema at anterior neck to decrease risk of infection.    Baseline 03/27/21- 40-50% decreased; at least 50% improved at this time as pt reports area of fullness is less widespread at anterior throat - 04/03/21    Status Achieved                   Plan - 04/18/21 1558     Clinical Impression Statement Pt is still awaiting to see if his insurance will approve his head and neck garment. Decreased to 1x/wk. Remeasured pt's circumferences and they are up slightly but overall still trending downwards. Continued with MLD today. Encouraged pt to reach out to his insurance company to check coverage.     PT Frequency 1x / week    PT Duration 4 weeks    PT Treatment/Interventions ADLs/Self Care Home Management;Patient/family education;Therapeutic exercise;Manual techniques;Manual lymph drainage;Orthotic Fit/Training;Compression bandaging;Taping;Vasopneumatic Device    PT Next Visit Plan review self MLD as needed, perform MLD anterior neck, did pt hear back from his utilization claim to [place]?    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[facility])     Problem List Patient Active Problem List   Diagnosis Date Noted   Encounter for dental examination and cleaning without abnormal findings 03/08/2021   Teeth missing 03/08/2021   Caries 03/08/2021   Defective dental restoration 03/08/2021   Excessive dental attrition 03/08/2021   Abfraction 03/08/2021   Diastema 03/08/2021   Malocclusion 03/08/2021   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   Incipient enamel caries 03/08/2021   Chronic periodontal disease 03/08/2021   Acquired lymphedema 01/11/2021   History of head and neck radiation 01/07/2021   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   Dysgeusia 01/07/2021   Accretions on teeth 01/07/2021   Pancytopenia, acquired ([place]) 12/07/2020   Dry mouth 12/07/2020   Leukopenia due to antineoplastic chemotherapy ([place]) 11/08/2020   Port-A-Cath in place 10/03/2020   Intractable hiccups 10/03/2020   Thrombocytopenia ([place]) 10/03/2020   Chemotherapy-induced fatigue 09/27/2020   Weight loss, unintentional 06/01/2020   Malnutrition of moderate degree 05/28/2020   GERD (gastroesophageal reflux disease)    Volume depletion     Hyperbilirubinemia    Hyponatremia    Mild protein-calorie malnutrition ([place])    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[place]) 05/17/2020   Mediastinal adenopathy 05/17/2020    Manus Gunning, PT 04/18/2021, 4:00 PM  [place] @ [place], [place], [address] Phone: [phone]   Fax:  [phone]  Name: Whitt Auletta MRN: [id number] Date of Birth: 1961-01-25

## 2021-04-23 ENCOUNTER — Encounter: Payer: Self-pay | Admitting: Rehabilitation

## 2021-04-25 ENCOUNTER — Other Ambulatory Visit: Payer: Self-pay

## 2021-04-25 ENCOUNTER — Encounter: Payer: Self-pay | Admitting: Physical Therapy

## 2021-04-25 ENCOUNTER — Ambulatory Visit: Payer: PRIVATE HEALTH INSURANCE | Admitting: Physical Therapy

## 2021-04-25 DIAGNOSIS — C109 Malignant neoplasm of oropharynx, unspecified: Secondary | ICD-10-CM

## 2021-04-25 DIAGNOSIS — I890 Lymphedema, not elsewhere classified: Secondary | ICD-10-CM

## 2021-04-25 DIAGNOSIS — R1313 Dysphagia, pharyngeal phase: Secondary | ICD-10-CM | POA: Diagnosis not present

## 2021-04-25 DIAGNOSIS — R293 Abnormal posture: Secondary | ICD-10-CM

## 2021-04-25 NOTE — Therapy (Signed)
[facility] @ [place] [address] [place], [place], [address] Phone: [phone]   Fax:  [phone]  Physical Therapy Treatment  Patient Details  Name: Alexander [name] MRN: [id number] Date of Birth: [birth year] Referring Provider (PT): Alvy Bimler   Encounter Date: 04/25/2021   PT End of Session - 04/25/21 1507     Visit Number 15    Number of Visits 19    Date for PT Re-Evaluation 05/23/21    PT Start Time 5277    PT Stop Time 1546    PT Time Calculation (min) 40 min    Activity Tolerance Patient tolerated treatment well    Behavior During Therapy [facility] for tasks assessed/performed             Past Medical History:  Diagnosis Date   Asthma    Cancer ([place])    GERD (gastroesophageal reflux disease)    Sleep apnea    CPAP- does not wear    Past Surgical History:  Procedure Laterality Date   BRONCHIAL NEEDLE ASPIRATION BIOPSY  05/22/2020   Procedure: BRONCHIAL NEEDLE ASPIRATION BIOPSIES;  Surgeon: Garner Nash, DO;  Location: [place];  Service: Pulmonary;;   DIRECT LARYNGOSCOPY  05/09/2020   Procedure: DIRECT LARYNGOSCOPY;  Surgeon: Rozetta Nunnery, MD;  Location: [place];  Service: ENT;;   IR GASTROSTOMY TUBE MOD SED  09/13/2020   IR GASTROSTOMY TUBE REMOVAL  12/14/2020   IR IMAGING GUIDED PORT INSERTION  06/12/2020   MASS EXCISION N/A 05/09/2020   Procedure: EXCISION TONGUE MASS;  Surgeon: Rozetta Nunnery, MD;  Location: [place];  Service: ENT;  Laterality: N/A;   TONSILLECTOMY     TRACHEOSTOMY TUBE PLACEMENT  05/09/2020   Procedure: TRACHEOSTOMY;  Surgeon: Rozetta Nunnery, MD;  Location: [place];  Service: ENT;;   VIDEO BRONCHOSCOPY WITH ENDOBRONCHIAL ULTRASOUND N/A 05/22/2020   Procedure: VIDEO BRONCHOSCOPY WITH ENDOBRONCHIAL ULTRASOUND;  Surgeon: Garner Nash, DO;  Location: [place];  Service: Pulmonary;  Laterality: N/A;    There were no vitals filed for this visit.   Subjective Assessment -  04/25/21 1553     Subjective My pump should be here today or tomorrow.    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 That portion of the mass had poorly defined margins but appeared to cross the midline and was estimated to measure at least 5.0 x 4.7 x 3.4 cm. The epiglottis and aryeglottic folds were not well separately delineated and may have been involved by mass. Tumor extension was suspected inferiorly at least to the level of the supraglottic larynx, 05/11/20 CT chest showed subcarinal mass/adenopathy and low lung volumes with bibasilar atelectasis. However, there was no acute intrathoracic pathology, 05/03/20 He saw Dr. [name] who performed a fiberoptic laryngoscopy through the right nostril. Nasopharyngeal was clear, but the base of the tongue was full on the left side and epiglottis was slightly pushed back posteriorly with questionable ulcer on the left base of the tongue extending down to the left vallecular area, biopsy of the base of the tongue on 05/09/2020 revealed: invasive squamous cell carcinoma.  Strongly p16 positive; emergent tracheostomy was also performed at that time,  received induction chemotherapy of 3 cycles of Carbo/5 FU, will now receive 35 fractions of radiation to his Base of tongue and bilateral neck and 7 weeks of cisplatin chemotherapy. He will also receive radiation to his subcarinal node in his chest area. He started on 09/19/20 and will complete 11/07/20, PAC 06/12/20, PEG 09/13/20, Tracheostomy placed 05/09/20 and decannulated 08/31/20.    Patient Stated Goals to limit the accumulation of lymphatic fluid in my neck    Currently in Pain? No/denies    Pain Score 0-No pain                                [facility] Adult PT Treatment/Exercise - 04/25/21 0001       Manual Therapy   Manual Lymphatic Drainage (MLD) supine with head elevated: short neck, superfical and deep abdominals, bil shoulder collectors, bilateral axillary nodes, bilateral pectoral nodes, bilateral chest, short neck, anterior neck, submandibular nodes, posterior, lateral and anterior neck then retracing all steps                          PT Long Term Goals - 04/25/21 1552       PT LONG TERM GOAL #1   Title Pt will be independent in self MLD for long term management of lymphedema.    Baseline 03/27/21- pt is independent with this    Time 4    Period Weeks    Status Achieved      PT LONG TERM GOAL #2   Title Pt will obtain appropriate compression garment for long term management of lymphedema.    Baseline 03/27/21- pt is finding out if his insurance covers his garments; pt is continuing to work with his insurance to get what they need working for approval - 04/03/21; 39/23- pt still awaiting on approval from insurance company    Time 4    Period Weeks    Status On-going      PT LONG TERM GOAL #3   Title Pt will report a 50% decrease in edema at anterior neck to decrease risk of infection.    Baseline 03/27/21- 40-50% decreased; at least 50% improved at this time as pt reports area of fullness is less widespread at anterior throat - 04/03/21    Time 4    Period Weeks    Status Achieved                   Plan - 04/25/21 1550     Clinical Impression Statement Pt should be receiving his FlexiTouch compression pump in the next couple of days and will meet with a trainer to learn how to use it. Still waiting to hear from his insurance company regarding coverage for the compression garment for long term management. Encouraged pt to reach out to his insurance company to check on the progress of this. Continued with MLD today. Pt would benefit from additional  skilled PT visits until pt is able to obtain a compression garment for long term management of his lymphedema.    PT Frequency 1x / week    PT Duration 4 weeks    PT Treatment/Interventions ADLs/Self Care Home Management;Patient/family education;Therapeutic exercise;Manual techniques;Manual lymph drainage;Orthotic Fit/Training;Compression bandaging;Taping;Vasopneumatic Device    PT Next Visit Plan review self MLD as needed, perform MLD anterior neck, did pt hear back from his utilization claim to [place]?    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[facility])     Problem List Patient Active Problem List   Diagnosis Date Noted   Encounter for dental examination and cleaning without abnormal findings 03/08/2021   Teeth missing 03/08/2021   Caries 03/08/2021   Defective dental restoration 03/08/2021   Excessive dental attrition 03/08/2021   Abfraction 03/08/2021   Diastema 03/08/2021   Malocclusion 03/08/2021   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   Incipient enamel caries 03/08/2021   Chronic periodontal disease 03/08/2021   Acquired lymphedema 01/11/2021   History of head and neck radiation 01/07/2021   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   Dysgeusia 01/07/2021   Accretions on teeth 01/07/2021   Pancytopenia, acquired ([place]) 12/07/2020   Dry mouth 12/07/2020   Leukopenia due to antineoplastic chemotherapy ([place]) 11/08/2020   Port-A-Cath in place 10/03/2020   Intractable hiccups 10/03/2020   Thrombocytopenia ([place]) 10/03/2020   Chemotherapy-induced fatigue 09/27/2020   Weight loss, unintentional 06/01/2020    Malnutrition of moderate degree 05/28/2020   GERD (gastroesophageal reflux disease)    Volume depletion    Hyperbilirubinemia    Hyponatremia    Mild protein-calorie malnutrition ([place])    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[place]) 05/17/2020   Mediastinal adenopathy 05/17/2020    Manus Gunning, PT 04/25/2021, 3:53 PM  [facility] @ [place], [place], [address] Phone: [phone]   Fax:  [phone]  Name: Alexander [name] MRN: [id number] Date of Birth: [birth year]

## 2021-04-30 ENCOUNTER — Ambulatory Visit: Payer: PRIVATE HEALTH INSURANCE | Admitting: Physical Therapy

## 2021-05-02 ENCOUNTER — Encounter: Payer: Self-pay | Admitting: Rehabilitation

## 2021-05-02 ENCOUNTER — Other Ambulatory Visit: Payer: Self-pay

## 2021-05-02 ENCOUNTER — Ambulatory Visit: Payer: PRIVATE HEALTH INSURANCE | Admitting: Rehabilitation

## 2021-05-02 DIAGNOSIS — R1313 Dysphagia, pharyngeal phase: Secondary | ICD-10-CM | POA: Diagnosis not present

## 2021-05-02 NOTE — Therapy (Signed)
[place] ?[facility] ?[place], [place], [address] ?Phone: [phone]   Fax:  [phone] ? ?Physical Therapy Treatment ? ?Patient Details  ?Name: Alexander [name] ?MRN: [id number] ?Date of Birth: July 28, 1960 ?Referring Provider (PT): Alvy Bimler ? ? ?Encounter Date: 05/02/2021 ? ? PT End of Session - 05/02/21 0956   ? ? Visit Number 16   ? Number of Visits 19   ? Date for PT Re-Evaluation 05/23/21   ? PT Start Time 1000   ? PT Stop Time 1030   ? PT Time Calculation (min) 30 min   ? Activity Tolerance Patient tolerated treatment well   ? Behavior During Therapy [facility] for tasks assessed/performed   ? ?  ?  ? ?  ? ? ?Past Medical History:  ?Diagnosis Date  ? Asthma   ? Cancer [facility])   ? GERD (gastroesophageal reflux disease)   ? Sleep apnea   ? CPAP- does not wear  ? ? ?Past Surgical History:  ?Procedure Laterality Date  ? BRONCHIAL NEEDLE ASPIRATION BIOPSY  05/22/2020  ? Procedure: BRONCHIAL NEEDLE ASPIRATION BIOPSIES;  Surgeon: Garner Nash, DO;  Location: [facility];  Service: Pulmonary;;  ? DIRECT LARYNGOSCOPY  05/09/2020  ? Procedure: DIRECT LARYNGOSCOPY;  Surgeon: Rozetta Nunnery, MD;  Location: [facility];  Service: ENT;;  ? IR GASTROSTOMY TUBE MOD SED  09/13/2020  ? IR GASTROSTOMY TUBE REMOVAL  12/14/2020  ? IR IMAGING GUIDED PORT INSERTION  06/12/2020  ? MASS EXCISION N/A 05/09/2020  ? Procedure: EXCISION TONGUE MASS;  Surgeon: Rozetta Nunnery, MD;  Location: [facility];  Service: ENT;  Laterality: N/A;  ? TONSILLECTOMY    ? TRACHEOSTOMY TUBE PLACEMENT  05/09/2020  ? Procedure: TRACHEOSTOMY;  Surgeon: Rozetta Nunnery, MD;  Location: [facility];  Service: ENT;;  ? VIDEO BRONCHOSCOPY WITH ENDOBRONCHIAL ULTRASOUND N/A 05/22/2020  ? Procedure: VIDEO BRONCHOSCOPY WITH ENDOBRONCHIAL ULTRASOUND;  Surgeon: Garner Nash, DO;  Location: [facility];  Service: Pulmonary;  Laterality: N/A;  ? ? ?There were no vitals filed for this visit. ? ? Subjective Assessment -  05/02/21 0957   ? ? Subjective I get trained today for the pump.  No word on compression garment. Do I need to keep coming now that I have my pump?   ? 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 That portion of the mass had poorly defined margins but appeared to cross the midline and was estimated to measure at least 5.0 x 4.7 x 3.4 cm. The epiglottis and aryeglottic folds were not well separately delineated and may have been involved by mass. Tumor extension was suspected inferiorly at least to the level of the supraglottic larynx, 05/11/20 CT chest showed subcarinal mass/adenopathy and low lung volumes with bibasilar atelectasis. However, there was no acute intrathoracic pathology, 05/03/20 He saw Dr. [name] who performed a fiberoptic laryngoscopy through the right nostril. Nasopharyngeal was clear, but the base of the tongue was full on the left side and epiglottis was slightly pushed back posteriorly with questionable ulcer on the left base of the tongue extending down to the left vallecular area, biopsy of the base of the tongue on 05/09/2020  revealed: invasive squamous cell carcinoma.  Strongly p16 positive; emergent tracheostomy was also performed at that time, received induction chemotherapy of 3 cycles of Carbo/5 FU, will now receive 35 fractions of radiation to his Base of tongue and bilateral neck and 7 weeks of cisplatin chemotherapy. He will also receive radiation to his subcarinal node in his chest area. He started on 09/19/20 and will complete 11/07/20, PAC 06/12/20, PEG 09/13/20, Tracheostomy placed 05/09/20 and decannulated 08/31/20.   ? Currently in Pain? No/denies   ? ?  ?  ? ?   ? ? ? ? ? ? ? ? ? ? ? ? ? ? ? ? ? ? ? ? [facility] Adult PT Treatment/Exercise - 05/02/21 0001   ? ?  ? Manual Therapy  ? Edema Management made pt new chip pack foam for use at home.  Discussed how pt could stop visits whenver he would like and that he could continue until figuring out the garment or he could use his pump and home chip packs and call or email [place] when he gets approval for the garment and we can order that or whatever needs to be done with maybe 1 follow up visit for fit check.   ? Manual Lymphatic Drainage (MLD) supine with head elevated: short neck, superfical and deep abdominals, bil shoulder collectors, bilateral axillary nodes, bilateral pectoral nodes, bilateral chest, short neck, anterior neck, submandibular nodes, posterior, lateral and anterior neck then retracing all steps.  Pt left after 62mn to get to flexitouch demo as they called while he was here to say they were almost to his home.   ? ?  ?  ? ?  ? ? ? ? ? ? ? ? ? ? ? ? ? ? ? PT Long Term Goals - 04/25/21 1552   ? ?  ? PT LONG TERM GOAL #1  ? Title Pt will be independent in self MLD for long term management of lymphedema.   ? Baseline 03/27/21- pt is independent with this   ? Time 4   ? Period Weeks   ? Status Achieved   ?  ? PT LONG TERM GOAL #2  ? Title Pt will obtain appropriate compression garment for long term management of lymphedema.   ? Baseline 03/27/21- pt is finding out if his insurance covers his garments; pt is continuing to work with his insurance to get what they need working for approval - 04/03/21; 39/23- pt still awaiting on approval from insurance company   ? Time 4   ? Period Weeks   ? Status On-going   ?  ? PT LONG TERM GOAL #3  ? Title Pt will report a 50% decrease in edema at anterior neck to decrease risk of infection.   ? Baseline 03/27/21- 40-50% decreased; at least 50% improved at this time as pt reports area of fullness is less widespread at anterior throat - 04/03/21   ? Time 4   ? Period Weeks   ? Status Achieved    ? ?  ?  ? ?  ? ? ? ? ? ? ? ? Plan - 05/02/21 1048   ? ? Clinical Impression Statement Pt getting trained on garment today and had to leave appt early as they were arriving at his home.  See edema management notes for conversation on continuing visits or stopping and [place] a garment [place].   ? PT Frequency 1x / week   ? PT Duration 4 weeks   ?  PT Treatment/Interventions ADLs/Self Care Home Management;Patient/family education;Therapeutic exercise;Manual techniques;Manual lymph drainage;Orthotic Fit/Training;Compression bandaging;Taping;Vasopneumatic Device   ? PT Next Visit Plan review self MLD as needed, perform MLD anterior neck, did pt hear back from his utilization claim to [place]?   ? Consulted and Agree with Plan of Care Patient   ? ?  ?  ? ?  ? ? ?Patient will benefit from skilled therapeutic intervention in order to improve the following deficits and impairments:    ? ?Visit Diagnosis: ?Lymphedema, not elsewhere classified ? ?Abnormal posture ? 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 ? ? ? ?Problem List ?Patient Active Problem List  ? Diagnosis Date Noted  ? Encounter for dental examination and cleaning without abnormal findings 03/08/2021  ? Teeth missing 03/08/2021  ? Caries 03/08/2021  ? Defective dental restoration 03/08/2021  ? Excessive dental attrition 03/08/2021  ? Abfraction 03/08/2021  ? Diastema 03/08/2021  ? Malocclusion 03/08/2021  ? 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 Incipient enamel caries 03/08/2021  ? Chronic periodontal disease 03/08/2021  ? Acquired lymphedema 01/11/2021  ? History of head and neck radiation 01/07/2021  ? 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 Dysgeusia 01/07/2021  ? Accretions on teeth 01/07/2021  ? Pancytopenia, acquired ([place]) 12/07/2020  ? Dry mouth 12/07/2020  ? Leukopenia due to antineoplastic chemotherapy ([place]) 11/08/2020  ? Port-A-Cath in place 10/03/2020  ? Intractable hiccups 10/03/2020  ? Thrombocytopenia ([place]) 10/03/2020  ? Chemotherapy-induced  fatigue 09/27/2020  ? Weight loss, unintentional 06/01/2020  ? Malnutrition of moderate degree 05/28/2020  ? GERD (gastroesophageal reflux disease)   ? Volume depletion   ? Hyperbilirubinemia   ? Hyponatremia   ? Mild protei

## 2021-05-07 ENCOUNTER — Encounter: Payer: Self-pay | Admitting: Physical Therapy

## 2021-05-09 ENCOUNTER — Encounter: Payer: Self-pay | Admitting: Physical Therapy

## 2021-05-14 ENCOUNTER — Encounter: Payer: Self-pay | Admitting: Physical Therapy

## 2021-05-16 ENCOUNTER — Ambulatory Visit: Payer: PRIVATE HEALTH INSURANCE | Admitting: Physical Therapy

## 2021-05-16 ENCOUNTER — Encounter: Payer: Self-pay | Admitting: Physical Therapy

## 2021-05-16 DIAGNOSIS — I890 Lymphedema, not elsewhere classified: Secondary | ICD-10-CM

## 2021-05-16 DIAGNOSIS — R293 Abnormal posture: Secondary | ICD-10-CM

## 2021-05-16 DIAGNOSIS — R1313 Dysphagia, pharyngeal phase: Secondary | ICD-10-CM | POA: Diagnosis not present

## 2021-05-16 DIAGNOSIS — C109 Malignant neoplasm of oropharynx, unspecified: Secondary | ICD-10-CM

## 2021-05-16 NOTE — Therapy (Signed)
[place] ?[facility] ?[place], [place], [address] ?Phone: [phone]   Fax:  [phone] ? ?Physical Therapy Treatment ? ?Patient Details  ?Name: Alexander [name] ?MRN: [id number] ?Date of Birth: 1960-05-13 ?Referring Provider (PT): Alvy Bimler ? ? ?Encounter Date: 05/16/2021 ? ? PT End of Session - 05/16/21 1459   ? ? Visit Number 17   ? Number of Visits 19   ? Date for PT Re-Evaluation 05/23/21   ? PT Start Time 4431   ? PT Stop Time 5400   ? PT Time Calculation (min) 47 min   ? Activity Tolerance Patient tolerated treatment well   ? Behavior During Therapy [facility] for tasks assessed/performed   ? ?  ?  ? ?  ? ? ?Past Medical History:  ?Diagnosis Date  ? Asthma   ? Cancer [facility])   ? GERD (gastroesophageal reflux disease)   ? Sleep apnea   ? CPAP- does not wear  ? ? ?Past Surgical History:  ?Procedure Laterality Date  ? BRONCHIAL NEEDLE ASPIRATION BIOPSY  05/22/2020  ? Procedure: BRONCHIAL NEEDLE ASPIRATION BIOPSIES;  Surgeon: Garner Nash, DO;  Location: [facility];  Service: Pulmonary;;  ? DIRECT LARYNGOSCOPY  05/09/2020  ? Procedure: DIRECT LARYNGOSCOPY;  Surgeon: Rozetta Nunnery, MD;  Location: [facility];  Service: ENT;;  ? IR GASTROSTOMY TUBE MOD SED  09/13/2020  ? IR GASTROSTOMY TUBE REMOVAL  12/14/2020  ? IR IMAGING GUIDED PORT INSERTION  06/12/2020  ? MASS EXCISION N/A 05/09/2020  ? Procedure: EXCISION TONGUE MASS;  Surgeon: Rozetta Nunnery, MD;  Location: [facility];  Service: ENT;  Laterality: N/A;  ? TONSILLECTOMY    ? TRACHEOSTOMY TUBE PLACEMENT  05/09/2020  ? Procedure: TRACHEOSTOMY;  Surgeon: Rozetta Nunnery, MD;  Location: [facility];  Service: ENT;;  ? VIDEO BRONCHOSCOPY WITH ENDOBRONCHIAL ULTRASOUND N/A 05/22/2020  ? Procedure: VIDEO BRONCHOSCOPY WITH ENDOBRONCHIAL ULTRASOUND;  Surgeon: Garner Nash, DO;  Location: [facility];  Service: Pulmonary;  Laterality: N/A;  ? ? ?There were no vitals filed for this visit. ? ? Subjective Assessment -  05/16/21 1500   ? ? Subjective I got the pump and I have been using it every night. Still no word on the compression garment so I will probably have to pay for that out of pocket.   ? 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 That portion of the mass had poorly defined margins but appeared to cross the midline and was estimated to measure at least 5.0 x 4.7 x 3.4 cm. The epiglottis and aryeglottic folds were not well separately delineated and may have been involved by mass. Tumor extension was suspected inferiorly at least to the level of the supraglottic larynx, 05/11/20 CT chest showed subcarinal mass/adenopathy and low lung volumes with bibasilar atelectasis. However, there was no acute intrathoracic pathology, 05/03/20 He saw Dr. [name] who performed a fiberoptic laryngoscopy through the right nostril. Nasopharyngeal was clear, but the base of the tongue was full on the left side and epiglottis was slightly pushed back posteriorly with questionable ulcer on the left base of the tongue extending down to the left vallecular area, biopsy of the  base of the tongue on 05/09/2020 revealed: invasive squamous cell carcinoma.  Strongly p16 positive; emergent tracheostomy was also performed at that time, received induction chemotherapy of 3 cycles of Carbo/5 FU, will now receive 35 fractions of radiation to his Base of tongue and bilateral neck and 7 weeks of cisplatin chemotherapy. He will also receive radiation to his subcarinal node in his chest area. He started on 09/19/20 and will complete 11/07/20, PAC 06/12/20, PEG 09/13/20, Tracheostomy placed 05/09/20 and decannulated 08/31/20.   ? Patient Stated Goals to limit  the accumulation of lymphatic fluid in my neck   ? Currently in Pain? No/denies   ? Pain Score 0-No pain   ? ?  ?  ? ?  ? ? ? ? ? ? ? ? LYMPHEDEMA/ONCOLOGY QUESTIONNAIRE - 05/16/21 0001   ? ?  ? Head and Neck  ? 4 cm superior to sternal notch around neck 42.5 cm   ? 6 cm superior to sternal notch around neck 42.5 cm   ? 8 cm superior to sternal notch around neck 43.5 cm   ? ?  ?  ? ?  ? ? ? ? ? ? ? ? ? ? ? ? ? [place] Adult PT Treatment/Exercise - 05/16/21 0001   ? ?  ? Manual Therapy  ? Edema Management circumference measurements taken, circumferences decreased, issued info for obtaining solaris swell spot head and neck garment   ? Manual Lymphatic Drainage (MLD) supine with head elevated: short neck, superfical and deep abdominals, bil shoulder collectors, bilateral axillary nodes, bilateral pectoral nodes, bilateral chest, short neck, anterior neck, submandibular nodes, posterior, lateral and anterior neck then retracing all steps   ? ?  ?  ? ?  ? ? ? ? ? ? ? ? ? ? ? ? ? ? ? PT Long Term Goals - 04/25/21 1552   ? ?  ? PT LONG TERM GOAL #1  ? Title Pt will be independent in self MLD for long term management of lymphedema.   ? Baseline 03/27/21- pt is independent with this   ? Time 4   ? Period Weeks   ? Status Achieved   ?  ? PT LONG TERM GOAL #2  ? Title Pt will obtain appropriate compression garment for long term management of lymphedema.   ? Baseline 03/27/21- pt is finding out if his insurance covers his garments; pt is continuing to work with his insurance to get what they need working for approval - 04/03/21; 39/23- pt still awaiting on approval from insurance company   ? Time 4   ? Period Weeks   ? Status On-going   ?  ? PT LONG TERM GOAL #3  ? Title Pt will report a 50% decrease in edema at anterior neck to decrease risk of infection.   ? Baseline 03/27/21- 40-50% decreased; at least 50% improved at this time as pt reports area of fullness is less widespread at anterior throat - 04/03/21   ? Time 4   ? Period Weeks    ? Status Achieved   ? ?  ?  ? ?  ? ? ? ? ? ? ? ? Plan - 05/16/21 1547   ? ? Clinical Impression Statement Remeasured pt's neck circumferences and they have decreased greatly since time of evaluation. There is a marked improvement in his anterior neck swelling since he has been using the compression pump daily. He never heard back from his insurance company regarding coverage of his head and  neck garment. Pt is just going to pay for this out of pocket. Issued info to patient on what to obtain. Will see pt for one additional visit to educate pt on proper donning/doffing of head and neck garment and to ensure pt is able to independently manage his lymphedema.   ? PT Frequency 1x / week   ? PT Duration 4 weeks   ? PT Treatment/Interventions ADLs/Self Care Home Management;Patient/family education;Therapeutic exercise;Manual techniques;Manual lymph drainage;Orthotic Fit/Training;Compression bandaging;Taping;Vasopneumatic Device   ? PT Next Visit Plan show pt how to don/doff head and neck garment, review self MLD as needed, perform MLD anterior neck, did pt hear back from his utilization claim to [place]?   ? PT Home Exercise Plan head and neck ROM exercises, self MLD   ? Consulted and Agree with Plan of Care Patient   ? ?  ?  ? ?  ? ? ?Patient will benefit from skilled therapeutic intervention in order to improve the following deficits and impairments:  Postural dysfunction, Decreased knowledge of precautions, Increased edema ? ?Visit Diagnosis: ?Lymphedema, not elsewhere classified ? ?Abnormal posture ? 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 ? ? ? ?Problem List ?Patient Active Problem List  ? Diagnosis Date Noted  ? Encounter for dental examination and cleaning without abnormal findings 03/08/2021  ? Teeth missing 03/08/2021  ? Caries 03/08/2021  ? Defective dental restoration 03/08/2021  ? Excessive dental attrition 03/08/2021  ? Abfraction 03/08/2021  ? Diastema 03/08/2021  ? Malocclusion 03/08/2021  ? Torus mandibularis  03/08/2021  ? Incipient enamel caries 03/08/2021  ? Chronic periodontal disease 03/08/2021  ? Acquired lymphedema 01/11/2021  ? History of head and neck radiation 01/07/2021  ? Xerostomia due to radiothera

## 2021-05-28 ENCOUNTER — Inpatient Hospital Stay (HOSPITAL_BASED_OUTPATIENT_CLINIC_OR_DEPARTMENT_OTHER): Payer: BC Managed Care – PPO | Admitting: Hematology and Oncology

## 2021-05-28 ENCOUNTER — Encounter: Payer: Self-pay | Admitting: Hematology and Oncology

## 2021-05-28 ENCOUNTER — Inpatient Hospital Stay: Payer: BC Managed Care – PPO | Attending: Hematology and Oncology

## 2021-05-28 ENCOUNTER — Other Ambulatory Visit: Payer: Self-pay

## 2021-05-28 VITALS — BP 110/69 | HR 74 | Temp 98.2°F | Resp 18 | Ht 72.0 in | Wt 252.9 lb

## 2021-05-28 DIAGNOSIS — T451X5A Adverse effect of antineoplastic and immunosuppressive drugs, initial encounter: Secondary | ICD-10-CM

## 2021-05-28 DIAGNOSIS — G620 Drug-induced polyneuropathy: Secondary | ICD-10-CM

## 2021-05-28 DIAGNOSIS — C01 Malignant neoplasm of base of tongue: Secondary | ICD-10-CM | POA: Diagnosis present

## 2021-05-28 DIAGNOSIS — R918 Other nonspecific abnormal finding of lung field: Secondary | ICD-10-CM | POA: Diagnosis not present

## 2021-05-28 DIAGNOSIS — Z95828 Presence of other vascular implants and grafts: Secondary | ICD-10-CM

## 2021-05-28 LAB — CBC WITH DIFFERENTIAL (CANCER CENTER ONLY)
Abs Immature Granulocytes: 0.01 10*3/uL (ref 0.00–0.07)
Basophils Absolute: 0 10*3/uL (ref 0.0–0.1)
Basophils Relative: 1 %
Eosinophils Absolute: 0.1 10*3/uL (ref 0.0–0.5)
Eosinophils Relative: 2 %
HCT: 35.9 % — ABNORMAL LOW (ref 39.0–52.0)
Hemoglobin: 11.9 g/dL — ABNORMAL LOW (ref 13.0–17.0)
Immature Granulocytes: 0 %
Lymphocytes Relative: 16 %
Lymphs Abs: 0.8 10*3/uL (ref 0.7–4.0)
MCH: 31.2 pg (ref 26.0–34.0)
MCHC: 33.1 g/dL (ref 30.0–36.0)
MCV: 94.2 fL (ref 80.0–100.0)
Monocytes Absolute: 0.4 10*3/uL (ref 0.1–1.0)
Monocytes Relative: 8 %
Neutro Abs: 3.8 10*3/uL (ref 1.7–7.7)
Neutrophils Relative %: 73 %
Platelet Count: 133 10*3/uL — ABNORMAL LOW (ref 150–400)
RBC: 3.81 MIL/uL — ABNORMAL LOW (ref 4.22–5.81)
RDW: 14.6 % (ref 11.5–15.5)
WBC Count: 5.1 10*3/uL (ref 4.0–10.5)
nRBC: 0 % (ref 0.0–0.2)

## 2021-05-28 LAB — BASIC METABOLIC PANEL - CANCER CENTER ONLY
Anion gap: 3 — ABNORMAL LOW (ref 5–15)
BUN: 21 mg/dL — ABNORMAL HIGH (ref 6–20)
CO2: 30 mmol/L (ref 22–32)
Calcium: 9.4 mg/dL (ref 8.9–10.3)
Chloride: 106 mmol/L (ref 98–111)
Creatinine: 1.2 mg/dL (ref 0.61–1.24)
GFR, Estimated: >60 mL/min (ref >60)
Glucose, Bld: 88 mg/dL (ref 70–99)
Potassium: 4 mmol/L (ref 3.5–5.1)
Sodium: 139 mmol/L (ref 135–145)

## 2021-05-28 LAB — MAGNESIUM: Magnesium: 2 mg/dL (ref 1.7–2.4)

## 2021-05-28 MED ORDER — SODIUM CHLORIDE 0.9% FLUSH
10.0000 mL | Freq: Once | INTRAVENOUS | Status: AC
  Administered 2021-05-28: 10 mL

## 2021-05-28 MED ORDER — GABAPENTIN 300 MG PO CAPS: 300.0000 mg | ORAL_CAPSULE | Freq: Every day | ORAL | 1 refills | Status: DC

## 2021-05-28 MED ORDER — HEPARIN SOD (PORK) LOCK FLUSH 100 UNIT/ML IV SOLN
500.0000 [IU] | Freq: Once | INTRAVENOUS | Status: AC
  Administered 2021-05-28: 500 [IU]

## 2021-05-28 NOTE — Assessment & Plan Note (Signed)
Chemotherapy-induced neuropathy.  Patient complains of tingling in the feet.  We will try gabapentin 300 mg nightly and monitor. ?

## 2021-05-28 NOTE — Assessment & Plan Note (Signed)
His last PET/CT did not show any active concerns for residual disease although there is mild metabolic activity in the floor of the mouth.  His next PET/CT is to be scheduled in May 2023.  No concerning review of systems today.  Physical examination without any concerns for local recurrence. ?We have encouraged him to schedule an appointment with ENT for surveillance.  Last TSH in November was normal.  Next TSH to be done in November 2023, ordered. ?

## 2021-05-28 NOTE — Progress Notes (Signed)
[place] ?OFFICE PROGRESS NOTE ? ?Patient Care Team: ?Pcp, No as PCP - General ?Malmfelt, Stephani Police, RN as Oncology Nurse Navigator ?Benay Pike, MD as Consulting Physician (Hematology and Oncology) ?Rozetta Nunnery, MD (Inactive) as Consulting Physician (Otolaryngology) ?Eppie Gibson, MD as Consulting Physician (Radiation Oncology) ? 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  His next PET/CT is to be scheduled in May 2023.  No concerning review of systems today.  Physical examination without any concerns for local recurrence. ?We have encouraged him to schedule an appointment with ENT for surveillance.  Last TSH in November was normal.  Next TSH to be done in November 2023, ordered. ? ?Chemotherapy-induced peripheral neuropathy ([place]) ?Chemotherapy-induced neuropathy.  Patient complains of tingling in the feet.  We will try gabapentin 300 mg nightly and monitor. ? ?Orders Placed This Encounter  ?Procedures  ? TSH  ?  Standing Status:   Standing  ?  Number of Occurrences:   57  ?  Standing Expiration Date:   05/29/2022  ? ? ?All questions were answered. The patient knows to call the clinic with any problems, questions or concerns. ?The total time spent in the appointment was 30 minutes encounter with patients including review of chart and various tests results, discussions about plan of care and coordination of care plan ?  ?Benay Pike, MD ?05/28/2021 1:34 PM ? ?INTERVAL HISTORY: ? ?Please see below for problem oriented charting. ? ?He returns for surveillance follow-up after completion of chemotherapy and radiation for squamous cell carcinoma of the tongue ?Since last visit, lymphedema is getting better. ?No fatigue, appetite is good. He is able to eat and swallow everything. Taste is lot better but not back to  normal. ?No change in breathing. No change in bowel habits. ?Weight has stayed stable. ?Tingling in feet, feels all the time, doesn't interfere with activities.  ? ?All other systems were reviewed with the patient and are negative. ? ?I have reviewed the past medical history, past surgical history, social history and family history with the patient and they are unchanged from previous note. ? ?ALLERGIES:  has No Known Allergies. ? ?MEDICATIONS:  ?Current Outpatient Medications  ?Medication Sig Dispense Refill  ? gabapentin (NEURONTIN) 300 MG capsule Take 1 capsule (300 mg total) by mouth at bedtime. 30 capsule 1  ? albuterol (PROVENTIL) (2.5 MG/3ML) 0.083% nebulizer solution Take 3 mLs (2.5 mg total) by nebulization every 6 (six) hours as needed for wheezing or shortness of breath. 120 mL 12  ? albuterol (VENTOLIN HFA) 108 (90 Base) MCG/ACT inhaler Inhale 2 puffs into the lungs as needed for wheezing or shortness of breath.    ? cetirizine (ZYRTEC) 10 MG tablet Take 10 mg by mouth daily.    ? fluticasone (FLONASE) 50 MCG/ACT nasal spray Place 2 sprays into both nostrils daily. Place 1 spray each nostril at night 16 g 11  ? Fluticasone-Salmeterol (ADVAIR) 100-50 MCG/DOSE AEPB Inhale 1 puff into the lungs daily.    ? omeprazole (PRILOSEC) 40 MG capsule Take 40 mg by mouth daily.    ? sodium fluoride (SODIUM FLUORIDE 5000 PPM) 1.1 % GEL dental gel Place pea-size drop into each tooth space of fluoride trays once a day at bedtime. Leave trays in mouth for 5 minutes, then remove and spit out excess fluoride. Do not rinse with  water, eat or drink for at least 30 minutes after use. 120 mL 11  ? ?No current facility-administered medications for this visit.  ? ?Facility-Administered Medications Ordered in Other Visits  ?Medication Dose Route Frequency Provider Last Rate Last Admin  ? magnesium sulfate 2 GM/50ML IVPB           ? ? ?SUMMARY OF ONCOLOGIC HISTORY: ?Oncology History  ?Squamous cell carcinoma of base of tongue  ([place])  ?05/17/2020 Initial Diagnosis  ? 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[place]) ?  ?05/18/2020 Cancer Staging  ? Staging form: Pharynx - HPV-Mediated Oropharynx, AJCC 8th Edition ?- Clinical stage from 05/18/2020: Stage IV (cT4, cN3, cM1, p16+) - Signed by Eppie Gibson, MD on 12/10/2020 ? ?  ?06/01/2020 Cancer Staging  ? Staging form: Pharynx - HPV-Mediated Oropharynx, AJCC 8th Edition ?- Pathologic stage from 06/01/2020: No Stage Recommended (cT4, cN3, cM1, p16+) - Signed by Benay Pike, MD on 06/01/2020 ?Stage prefix: Initial diagnosis ? ?  ?06/12/2020 - 08/21/2020 Chemotherapy  ?  ? ?  ? ?  ?09/10/2020 - 09/10/2020 Chemotherapy  ?  ? ?  ? ?  ?09/19/2020 - 11/09/2020 Chemotherapy  ? Patient is on Treatment Plan : HEAD/NECK Cisplatin q7d  ?   ?02/15/2021 Imaging  ? Improved findings when compared to prior PET-CT suggesting a good ?response to treatment. Small focus of residual hypermetabolism in ?the anterior aspect of the floor the mouth and small residual ?slightly hypermetabolic level 2 lymph nodes. Recommend continued ?surveillance. No findings for metastatic disease involving the chest, abdomen ?or pelvis.Small patchy infiltrates in the right lung. ? ?  ? ? ?PHYSICAL EXAMINATION: ?ECOG PERFORMANCE STATUS: 1 - Symptomatic but completely ambulatory ? ?Vitals:  ? 05/28/21 1315  ?BP: 110/69  ?Pulse: 74  ?Resp: 18  ?Temp: 98.2 ?F (36.8 ?C)  ?SpO2: 100%  ? ?Filed Weights  ? 05/28/21 1315  ?Weight: 252 lb 14.4 oz (114.7 kg)  ? ? ?Physical Exam ?Constitutional:   ?   Appearance: Normal appearance.  ?HENT:  ?   Head: Normocephalic and atraumatic.  ?Eyes:  ?   Pupils: Pupils are equal, round, and reactive to light.  ?Cardiovascular:  ?   Rate and Rhythm: Normal rate and regular rhythm.  ?   Pulses: Normal pulses.  ?   Heart sounds: Normal heart sounds.  ?Pulmonary:  ?   Effort: Pulmonary effort is normal.  ?   Breath sounds: Normal breath sounds.  ?Abdominal:  ?   General: Abdomen is flat. Bowel sounds are normal.   ?Musculoskeletal:     ?   General: Normal range of motion.  ?   Cervical back: Normal range of motion and neck supple. No rigidity.  ?Lymphadenopathy:  ?   Cervical: No cervical adenopathy.  ?Skin: ?   General: Skin is warm and dry.  ?Neurological:  ?   General: No focal deficit present.  ?   Mental Status: He is alert.  ? ? ? ?LABORATORY DATA:  ?I have reviewed the data as listed ?   ?Component Value Date/Time  ? NA 141 03/08/2021 0800  ? K 3.9 03/08/2021 0800  ? CL 106 03/08/2021 0800  ? CO2 29 03/08/2021 0800  ? GLUCOSE 95 03/08/2021 0800  ? BUN 17 03/08/2021 0800  ? CREATININE 1.24 03/08/2021 0800  ? CALCIUM 9.3 03/08/2021 0800  ? PROT 6.8 01/11/2021 1000  ? ALBUMIN 4.0 01/11/2021 1000  ? AST 12 (L) 01/11/2021 1000  ? AST 17 09/19/2020 0826  ? ALT  9 01/11/2021 1000  ? ALT 15 09/19/2020 0826  ? ALKPHOS 46 01/11/2021 1000  ? BILITOT 1.0 01/11/2021 1000  ? BILITOT 0.6 09/19/2020 0826  ? GFRNONAA >60 03/08/2021 0800  ? ? ?No results found for: SPEP, UPEP ? ?Lab Results  ?Component Value Date  ? WBC 5.1 05/28/2021  ? NEUTROABS 3.8 05/28/2021  ? HGB 11.9 (L) 05/28/2021  ? HCT 35.9 (L) 05/28/2021  ? MCV 94.2 05/28/2021  ? PLT 133 (L) 05/28/2021  ? ? ?  Chemistry   ?   ?Component Value Date/Time  ? NA 141 03/08/2021 0800  ? K 3.9 03/08/2021 0800  ? CL 106 03/08/2021 0800  ? CO2 29 03/08/2021 0800  ? BUN 17 03/08/2021 0800  ? CREATININE 1.24 03/08/2021 0800  ?    ?Component Value Date/Time  ? CALCIUM 9.3 03/08/2021 0800  ? ALKPHOS 46 01/11/2021 1000  ? AST 12 (L) 01/11/2021 1000  ? AST 17 09/19/2020 0826  ? ALT 9 01/11/2021 1000  ? ALT 15 09/19/2020 0826  ? BILITOT 1.0 01/11/2021 1000  ? BILITOT 0.6 09/19/2020 0826  ?  ? ?PET CT ? ?IMPRESSION: ?1. Improved findings when compared to prior PET-CT suggesting a good ?response to treatment. Small focus of residual hypermetabolism in ?the anterior aspect of the floor the mouth and small residual ?slightly hypermetabolic level 2 lymph nodes. Recommend  continued ?surveillance. ?2. No findings for metastatic disease involving the chest, abdomen ?or pelvis. ?3. Small patchy infiltrates in the right lung. ? ?Next PET/CT due in May 2023, yet to be scheduled ? ?I spent 30 minutes in the care of this

## 2021-05-29 NOTE — Progress Notes (Signed)
Oncology Nurse Navigator Documentation  ? ?At Alexander [name] request I have faxed a request to [facility] to transfer Alexander [name] care there due to the retirement of his previous ENT Dr. Loletha Grayer. [name].  ? ?Notification of successful fax transmission received.  ? ?Alexander Asa RN, BSN, OCN ?Head & Neck Oncology Nurse Navigator ?[place] [facility] ?Phone # [phone]  ?Fax # [phone]  ?

## 2021-05-31 ENCOUNTER — Encounter: Payer: Self-pay | Admitting: Hematology and Oncology

## 2021-05-31 ENCOUNTER — Other Ambulatory Visit: Payer: PRIVATE HEALTH INSURANCE

## 2021-05-31 ENCOUNTER — Ambulatory Visit: Payer: PRIVATE HEALTH INSURANCE | Admitting: Hematology and Oncology

## 2021-06-04 ENCOUNTER — Encounter: Payer: Self-pay | Admitting: Physical Therapy

## 2021-06-04 ENCOUNTER — Ambulatory Visit: Payer: PRIVATE HEALTH INSURANCE | Attending: Hematology and Oncology | Admitting: Physical Therapy

## 2021-06-04 DIAGNOSIS — C109 Malignant neoplasm of oropharynx, unspecified: Secondary | ICD-10-CM | POA: Insufficient documentation

## 2021-06-04 DIAGNOSIS — R293 Abnormal posture: Secondary | ICD-10-CM | POA: Insufficient documentation

## 2021-06-04 DIAGNOSIS — I890 Lymphedema, not elsewhere classified: Secondary | ICD-10-CM | POA: Diagnosis present

## 2021-06-04 NOTE — Therapy (Signed)
?[facility] ?[place], [place], [address] ?Phone: [phone]   Fax:  [phone] ? ?Physical Therapy Treatment ? ?Patient Details  ?Name: Alexander [name] ?MRN: [id number] ?Date of Birth: [birth year] ?Referring Provider (PT): Alvy Bimler ? ? ?Encounter Date: 06/04/2021 ? ? PT End of Session - 06/04/21 1546   ? ? Visit Number 18   ? Number of Visits 19   ? Date for PT Re-Evaluation 05/23/21   ? PT Start Time 1503   ? PT Stop Time 3785   ? PT Time Calculation (min) 42 min   ? Activity Tolerance Patient tolerated treatment well   ? Behavior During Therapy [facility] for tasks assessed/performed   ? ?  ?  ? ?  ? ? ?Past Medical History:  ?Diagnosis Date  ? Asthma   ? Cancer [facility])   ? GERD (gastroesophageal reflux disease)   ? Sleep apnea   ? CPAP- does not wear  ? ? ?Past Surgical History:  ?Procedure Laterality Date  ? BRONCHIAL NEEDLE ASPIRATION BIOPSY  05/22/2020  ? Procedure: BRONCHIAL NEEDLE ASPIRATION BIOPSIES;  Surgeon: Garner Nash, DO;  Location: [place];  Service: Pulmonary;;  ? DIRECT LARYNGOSCOPY  05/09/2020  ? Procedure: DIRECT LARYNGOSCOPY;  Surgeon: Rozetta Nunnery, MD;  Location: [place];  Service: ENT;;  ? IR GASTROSTOMY TUBE MOD SED  09/13/2020  ? IR GASTROSTOMY TUBE REMOVAL  12/14/2020  ? IR IMAGING GUIDED PORT INSERTION  06/12/2020  ? MASS EXCISION N/A 05/09/2020  ? Procedure: EXCISION TONGUE MASS;  Surgeon: Rozetta Nunnery, MD;  Location: [place];  Service: ENT;  Laterality: N/A;  ? TONSILLECTOMY    ? TRACHEOSTOMY TUBE PLACEMENT  05/09/2020  ? Procedure: TRACHEOSTOMY;  Surgeon: Rozetta Nunnery, MD;  Location: [place];  Service: ENT;;  ? VIDEO BRONCHOSCOPY WITH ENDOBRONCHIAL ULTRASOUND N/A 05/22/2020  ? Procedure: VIDEO BRONCHOSCOPY WITH ENDOBRONCHIAL ULTRASOUND;  Surgeon: Garner Nash, DO;  Location: [place];  Service: Pulmonary;  Laterality: N/A;  ? ? ?There were no vitals filed for this visit. ? ? Subjective Assessment -  06/04/21 1505   ? ? Subjective I got the compression garment. I figured out how to put it on. My swelling feels great. The compression pump is going well.   ? 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 That portion of the mass had poorly defined margins but appeared to cross the midline and was estimated to measure at least 5.0 x 4.7 x 3.4 cm. The epiglottis and aryeglottic folds were not well separately delineated and may have been involved by mass. Tumor extension was suspected inferiorly at least to the level of the supraglottic larynx, 05/11/20 CT chest showed subcarinal mass/adenopathy and low lung volumes with bibasilar atelectasis. However, there was no acute intrathoracic pathology, 05/03/20 He saw Dr. [name] who performed a fiberoptic laryngoscopy through the right nostril. Nasopharyngeal was clear, but the base of the tongue was full on the left side and epiglottis was slightly pushed back posteriorly with questionable ulcer on the left base of the tongue extending down to the left vallecular area, biopsy of the base of the tongue on 05/09/2020 revealed: invasive  squamous cell carcinoma.  Strongly p16 positive; emergent tracheostomy was also performed at that time, received induction chemotherapy of 3 cycles of Carbo/5 FU, will now receive 35 fractions of radiation to his Base of tongue and bilateral neck and 7 weeks of cisplatin chemotherapy. He will also receive radiation to his subcarinal node in his chest area. He started on 09/19/20 and will complete 11/07/20, PAC 06/12/20, PEG 09/13/20, Tracheostomy placed 05/09/20 and decannulated 08/31/20.   ? Patient Stated Goals to limit the accumulation of  lymphatic fluid in my neck   ? Currently in Pain? No/denies   ? Pain Score 0-No pain   ? ?  ?  ? ?  ? ? ? ? ? ? ? ? LYMPHEDEMA/ONCOLOGY QUESTIONNAIRE - 06/04/21 0001   ? ?  ? Head and Neck  ? 6 cm superior to sternal notch around neck 42.5 cm   ? 8 cm superior to sternal notch around neck 43.5 cm   ? ?  ?  ? ?  ? ? ? ? ? ? ? ? ? ? ? ? ? [facility] [place] Adult PT Treatment/Exercise - 06/04/21 0001   ? ?  ? Manual Therapy  ? Edema Management circumference measurements taken, circumferences have remain decreased   ? Manual Lymphatic Drainage (MLD) supine with head elevated: short neck, superfical and deep abdominals, bil shoulder collectors, bilateral axillary nodes, bilateral pectoral nodes, bilateral chest, short neck, anterior neck, submandibular nodes, posterior, lateral and anterior neck then retracing all steps   ? ?  ?  ? ?  ? ? ? ? ? ? ? ? ? ? ? ? ? ? ? PT Long Term Goals - 06/04/21 1507   ? ?  ? PT LONG TERM GOAL #1  ? Title Pt will be independent in self MLD for long term management of lymphedema.   ? Baseline 03/27/21- pt is independent with this   ? Time 4   ? Period Weeks   ? Status Achieved   ?  ? PT LONG TERM GOAL #2  ? Title Pt will obtain appropriate compression garment for long term management of lymphedema.   ? Baseline 03/27/21- pt is finding out if his insurance covers his garments; pt is continuing to work with his insurance to get what they need working for approval - 04/03/21; 39/23- pt still awaiting on approval from insurance company; 06/04/21- pt ordered solaris head and neck garment and has been wearing it   ? Time 4   ? Period Weeks   ? Status Achieved   ?  ? PT LONG TERM GOAL #3  ? Title Pt will report a 50% decrease in edema at anterior neck to decrease risk of infection.   ? Baseline 03/27/21- 40-50% decreased; at least 50% improved at this time as pt reports area of fullness is less widespread at anterior throat - 04/03/21   ? Time 4   ? Period Weeks   ? Status Achieved   ? ?  ?  ? ?  ? ? ? ? ? ? ? ? Plan -  06/04/21 1547   ? ? Clinical Impression Statement Assessed pt's progress towards goals in therapy. He has now met all goals for therapy. His neck circumferences have decreased greatly since starting therapy. He uses his FlexiTouch compression pump daily. He also ordered the solaris head and neck garment and has been wearing that consistently. He reports he is independent in donning/doffing. Educated pt to continue to use his pump and garment  daily for long term management. Continued with MLD to head and neck today. Will discharge pt today as pt has now met all goals and no longer requires skilled PT services.   ? PT Frequency 1x / week   ? PT Duration 4 weeks   ? PT Treatment/Interventions ADLs/Self Care Home Management;Patient/family education;Therapeutic exercise;Manual techniques;Manual lymph drainage;Orthotic Fit/Training;Compression bandaging;Taping;Vasopneumatic Device   ? PT Next Visit Plan d/c this visit   ? PT Home Exercise Plan head and neck ROM exercises, self MLD, use compression pump, wear compression garment daily   ? Consulted and Agree with Plan of Care Patient   ? ?  ?  ? ?  ? ? ?Patient will benefit from skilled therapeutic intervention in order to improve the following deficits and impairments:  Postural dysfunction, Decreased knowledge of precautions, Increased edema ? ?Visit Diagnosis: ?Lymphedema, not elsewhere classified ? ?Abnormal posture ? 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 ? ? ? ?Problem List ?Patient Active Problem List  ? Diagnosis Date Noted  ? Chemotherapy-induced peripheral neuropathy ([place]) 05/28/2021  ? Encounter for dental examination and cleaning without abnormal findings 03/08/2021  ? Teeth missing 03/08/2021  ? Caries 03/08/2021  ? Defective dental restoration 03/08/2021  ? Excessive dental attrition 03/08/2021  ? Abfraction 03/08/2021  ? Diastema 03/08/2021  ? Malocclusion 03/08/2021  ? 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 Incipient enamel caries 03/08/2021  ? Chronic periodontal disease  03/08/2021  ? Acquired lymphedema 01/11/2021  ? History of head and neck radiation 01/07/2021  ? 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 Dysgeusia 01/07/2021  ? Accretions on teeth 01/07/2021  ? [place]

## 2021-06-13 ENCOUNTER — Encounter: Payer: Self-pay | Admitting: Hematology and Oncology

## 2021-06-17 ENCOUNTER — Ambulatory Visit (HOSPITAL_COMMUNITY)
Admission: RE | Admit: 2021-06-17 | Discharge: 2021-06-17 | Disposition: A | Discharge status: Home / Self Care | Payer: PRIVATE HEALTH INSURANCE | Source: Ambulatory Visit | Attending: Radiation Oncology | Admitting: Radiation Oncology

## 2021-06-17 DIAGNOSIS — C01 Malignant neoplasm of base of tongue: Secondary | ICD-10-CM | POA: Insufficient documentation

## 2021-06-17 LAB — GLUCOSE, CAPILLARY: Glucose-Capillary: 90 mg/dL (ref 70–99)

## 2021-06-17 MED ORDER — FLUDEOXYGLUCOSE F - 18 (FDG) INJECTION
12.0000 | Freq: Once | INTRAVENOUS | Status: AC | PRN
  Administered 2021-06-17: 12.02 via INTRAVENOUS

## 2021-06-19 ENCOUNTER — Other Ambulatory Visit: Payer: Self-pay | Admitting: Hematology and Oncology

## 2021-06-19 ENCOUNTER — Other Ambulatory Visit: Payer: Self-pay

## 2021-06-19 ENCOUNTER — Ambulatory Visit
Admission: RE | Admit: 2021-06-19 | Discharge: 2021-06-19 | Disposition: A | Discharge status: Home / Self Care | Payer: PRIVATE HEALTH INSURANCE | Source: Ambulatory Visit | Attending: Radiation Oncology | Admitting: Radiation Oncology

## 2021-06-19 VITALS — BP 112/71 | HR 88 | Temp 97.5°F | Resp 18 | Ht 72.0 in | Wt 256.0 lb

## 2021-06-19 DIAGNOSIS — Z7951 Long term (current) use of inhaled steroids: Secondary | ICD-10-CM | POA: Insufficient documentation

## 2021-06-19 DIAGNOSIS — I890 Lymphedema, not elsewhere classified: Secondary | ICD-10-CM | POA: Insufficient documentation

## 2021-06-19 DIAGNOSIS — I700 Atherosclerosis of aorta: Secondary | ICD-10-CM | POA: Diagnosis not present

## 2021-06-19 DIAGNOSIS — Z85810 Personal history of malignant neoplasm of tongue: Secondary | ICD-10-CM | POA: Diagnosis not present

## 2021-06-19 DIAGNOSIS — Z923 Personal history of irradiation: Secondary | ICD-10-CM | POA: Insufficient documentation

## 2021-06-19 DIAGNOSIS — C01 Malignant neoplasm of base of tongue: Secondary | ICD-10-CM

## 2021-06-19 NOTE — Progress Notes (Signed)
?Radiation Oncology         [phone] ?________________________________ ? ?Name: Alexander [name] MRN: [id number]  ?Date: 06/19/2021  DOB: June 13, 1960 ? ?Follow-Up Visit Note ? ?Outpatient ? ?CC: System, Provider Not In  Alexander [name], * ? ?Diagnosis and Prior Radiotherapy: C01 ?  ICD-10-CM   ?1. 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  ? ? 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 ? ? ?Radiation Treatment Dates: 09/19/2020 through 11/07/2020 ?Site Technique Total Dose (Gy) Dose per Fx (Gy) Completed Fx Beam Energies  ?Neck: HN_BOT IMRT 70/70 2 35/35 6X  ?Mediastinum: Chest_subcari IMRT 66/66 2 33/33 6X  ? ?CHIEF COMPLAINT: Here for follow-up and surveillance of throat cancer ? ?Narrative:    Alexander [name] presents today for follow-up after completing radiation to his base of tongue and subcarinal node on 11/07/2020 and to review PET scan result from 06/17/2021 ? ?Pain issues, if any: Denies any mouth or throat pain, or any ear/jaw pain ?Using a feeding tube?: N/A ?Weight changes, if any:  ?Wt Readings from Last 3 Encounters:  ?06/19/21 256 lb (116.1 kg)  ?05/28/21 252 lb 14.4 oz (114.7 kg)  ?03/08/21 240 lb 6.4 oz (109 kg)  ? ?Swallowing issues, if any: Denies. States that as long as he takes his time, and doesn't eat too big of a bite (or too large of a sip) he can tolerate a wide variety in his diet ?Smoking or chewing tobacco? None ?Using fluoride trays daily? Yes--reports he's scheduled for a cleaning with Alexander [name] visit next month, as well as fillings in July  ?Last ENT visit was on: Reports he's scheduled to establish care with Dr.  Ebbie [name] this Friday 5/5 ?Other notable issues, if any: Continues to deal with dry mouth and occasional thick saliva, but reports both continue to improve. Discharged from PT last month. Uses his flexitouch and compression garment regularly. Overall, reports he is doing very well, in great spirits, and pleased with his continued progress/improvement ? ? ? ? ?ALLERGIES:  has No Known Allergies. ? ?Meds: ?Current Outpatient Medications  ?Medication Sig Dispense Refill  ? albuterol (PROVENTIL) (2.5 MG/3ML) 0.083% nebulizer solution Take 3 mLs (2.5 mg total) by nebulization every 6 (six) hours as needed for wheezing or shortness of breath. 120 mL 12  ? albuterol (VENTOLIN HFA) 108 (90 Base) MCG/ACT inhaler Inhale 2 puffs into the lungs as needed for wheezing or shortness of breath.    ? cetirizine (ZYRTEC) 10 MG tablet Take 10 mg by mouth daily.    ? fluticasone (FLONASE) 50 MCG/ACT nasal spray Place 2 sprays into both nostrils daily. Place 1 spray each nostril at night 16 g 11  ? Fluticasone-Salmeterol (ADVAIR) 100-50 MCG/DOSE AEPB Inhale 1 puff into the lungs daily.    ? gabapentin (NEURONTIN) 300 MG capsule Take 1 capsule (300 mg total) by mouth at bedtime. 30 capsule 1  ? omeprazole (PRILOSEC) 40 MG capsule Take 40 mg by mouth daily.    ? sodium fluoride (SODIUM FLUORIDE 5000 PPM) 1.1 % GEL dental gel Place pea-size drop into each tooth space of fluoride trays once a day at bedtime.  Leave trays in mouth for 5 minutes, then remove and spit out excess fluoride. Do not rinse with water, eat or drink for at least 30 minutes after use. 120 mL 11  ? ?No current facility-administered medications for this encounter.  ? ?Facility-Administered Medications Ordered in Other Encounters  ?Medication Dose Route Frequency Provider Last Rate Last Admin  ? magnesium sulfate 2 GM/50ML IVPB           ? ? ?Physical Findings: ?The patient is in no acute distress. Patient is alert and oriented. ? height is 6' (1.829 m) and weight  is 256 lb (116.1 kg). His temporal temperature is 97.5 ?F (36.4 ?C) (abnormal). His blood pressure is 112/71 and his pulse is 88. His respiration is 18 and oxygen saturation is 100%. [name]    ?HEENT: No evidence of tumor in the mouth or upper throat.    ?Skin: Has healed beautifully over his neck. ?NECK: No palpable masses in the cervical or supraclavicular neck.   ?Lymphedema has improved greatly and is mild ?Heart: RRR ?Chest: CTAB ? ?Lab Findings: ?Lab Results  ?Component Value Date  ? WBC 5.1 05/28/2021  ? HGB 11.9 (L) 05/28/2021  ? HCT 35.9 (L) 05/28/2021  ? MCV 94.2 05/28/2021  ? PLT 133 (L) 05/28/2021  ? ? ?Radiographic Findings: ?NM PET Image Restag (PS) Skull Base To Thigh ? ?Result Date: 06/18/2021 ?CLINICAL DATA:  Subsequent treatment strategy for head and neck cancer. EXAM: NUCLEAR MEDICINE PET SKULL BASE TO THIGH TECHNIQUE: 12.02 mCi F-18 FDG was injected intravenously. Full-ring PET imaging was performed from the skull base to thigh after the radiotracer. CT data was obtained and used for attenuation correction and anatomic localization. Fasting blood glucose: 90 mg/dl COMPARISON:  02/15/2021 FINDINGS: Mediastinal blood pool activity: SUV max 3.45 Liver activity: SUV max NA NECK: Interval resolution FDG uptake within the tongue. Right level 2 lymph node measures 1.0 cm with SUV max of 3.72, image 36/4. On the previous exam this measured 1 cm with an SUV max of 4.37. Right level 5 lymph node measures 0.8 cm with SUV max of 3.3, image 44/4. Formally this measured 1.1 cm with SUV max of 2.35. No new or enlarging FDG avid lymph nodes within the soft tissues of the neck. Incidental CT findings: none CHEST: No hypermetabolic mediastinal or hilar nodes. No suspicious pulmonary nodules on the CT scan. No suspicious lung nodules. Incidental CT findings: Mild aortic atherosclerotic calcification. ABDOMEN/PELVIS: No abnormal hypermetabolic activity within the liver, pancreas, adrenal glands, or spleen. No  hypermetabolic lymph nodes in the abdomen or pelvis. Incidental CT findings: none SKELETON: No focal hypermetabolic activity to suggest skeletal metastasis. Incidental CT findings: none IMPRESSION: 1. No residual tracer uptake identified within the tongue compatible with response to therapy. 2. There are 2 small left cervical lymph nodes measuring 1 cm each. These exhibit mild, low level nonspecific FDG uptake, which is at or just above blood pool activity. Continued attention on future surveillance imaging advised. 3. No signs of tracer avid metastatic disease within the chest, abdomen or pelvis. 4.  Aortic Atherosclerosis (ICD10-I70.0). Electronically Signed   By: Kerby Moors M.D.   On: 06/18/2021 09:37   ? ?Impression/Plan:  ? ? ?1) Head and Neck Cancer Status: in remission -  there is mild hypermetabolic activity in some of the nodal tissue but no concerning masses or progression. PET reviewed at tumor board today. Med/onc will see him in 5 mo with CT neck and chest with contrast for surveillance. ? ?  2) Nutritional Status: no active issues, PEG removed, pt is pleased with weight and eating.   ? ?3) Swallowing: SLP exercises will continue, he is pleased with swallowing overall ? ?4) Lymphedema of neck: Using Flexitouch PT device ? ?5) Dental: Encouraged to continue regular followup with dentistry, and dental hygiene including fluoride rinses.  He is doing so. ? ?6) Thyroid function - check annually in med onc ?Lab Results  ?Component Value Date  ? TSH 1.313 01/11/2021  ? ?7) See me in 60mo sooner PRN. ? ?On date of service, in total, I spent 25 minutes on this encounter. Patient was seen in person.  ?_____________________________________ ? ? ?SEppie Gibson MD ? ?

## 2021-06-19 NOTE — Progress Notes (Signed)
CT neck and chest ordered for Oct 2023 ?He will follow up with me after imaging. ? ?Alexander [name]  ? ?

## 2021-06-19 NOTE — Progress Notes (Signed)
Alexander [name] presents today for follow-up after completing radiation to his base of tongue and subcarinal node on 11/07/2020 and to review PET scan result from 06/17/2021 ? ?Pain issues, if any: Denies any mouth or throat pain, or any ear/jaw pain ?Using a feeding tube?: N/A ?Weight changes, if any:  ?Wt Readings from Last 3 Encounters:  ?06/19/21 256 lb (116.1 kg)  ?05/28/21 252 lb 14.4 oz (114.7 kg)  ?03/08/21 240 lb 6.4 oz (109 kg)  ? ?Swallowing issues, if any: Denies. States that as long as he takes his time, and doesn't eat too big of a bite (or too large of a sip) he can tolerate a wide variety in his diet ?Smoking or chewing tobacco? None ?Using fluoride trays daily? Yes--reports he's scheduled for a cleaning with Dr. Benson Norway visit next month, as well as fillings in July  ?Last ENT visit was on: Reports he's scheduled to establish care with Dr. Ebbie Latus this Friday 5/5 ?Other notable issues, if any: Continues to deal with dry mouth and occasional thick saliva, but reports both continue to improve. Discharged from PT last month. Uses his flexitouch and compression garment regularly. Overall, reports he is doing very well, in great spirits, and pleased with his continued progress/improvement ? ? ? ? ?

## 2021-07-10 ENCOUNTER — Encounter (HOSPITAL_COMMUNITY): Payer: PRIVATE HEALTH INSURANCE | Admitting: Dentistry

## 2021-07-31 ENCOUNTER — Ambulatory Visit (INDEPENDENT_AMBULATORY_CARE_PROVIDER_SITE_OTHER): Payer: Worker's Compensation | Admitting: Dentistry

## 2021-07-31 ENCOUNTER — Encounter (HOSPITAL_COMMUNITY): Payer: Self-pay | Admitting: Dentistry

## 2021-07-31 VITALS — BP 118/74 | HR 86 | Temp 98.9°F

## 2021-07-31 DIAGNOSIS — Y842 Radiological procedure and radiotherapy as the cause of abnormal reaction of the patient, or of later complication, without mention of misadventure at the time of the procedure: Secondary | ICD-10-CM

## 2021-07-31 DIAGNOSIS — Z923 Personal history of irradiation: Secondary | ICD-10-CM | POA: Diagnosis not present

## 2021-07-31 DIAGNOSIS — K029 Dental caries, unspecified: Secondary | ICD-10-CM | POA: Diagnosis not present

## 2021-07-31 DIAGNOSIS — Z0120 Encounter for dental examination and cleaning without abnormal findings: Secondary | ICD-10-CM | POA: Diagnosis not present

## 2021-07-31 DIAGNOSIS — K117 Disturbances of salivary secretion: Secondary | ICD-10-CM

## 2021-07-31 DIAGNOSIS — K036 Deposits [accretions] on teeth: Secondary | ICD-10-CM

## 2021-07-31 DIAGNOSIS — F40232 Fear of other medical care: Secondary | ICD-10-CM

## 2021-07-31 NOTE — Progress Notes (Signed)
Department of Dental Medicine   Service Date:   07/31/2021  Patient Name:  Alexander [name] Date of Birth:   [birth year] Medical Record Number: [id number]  TODAY'S VISIT: 4 MONTH RECALL + UPDATED RADIOGRAPHS   PROCEDURES: Adult prophylaxis, updated BW's ASSESSMENT: Caries risk:  HIGH Periodontal impression:  Accretions on teeth, chronic periodontitis Patient w/ history of head & neck radiation. PLAN/RECOMMENDATIONS: Rx:  Diazepam 5 mg Continue w/ cleanings every 4 mos & periodic exams+update BW's every 6 mos  Next visit:  Restorative per treatment plan   07/31/2021   HISTORY OF PRESENT ILLNESS: Alexander [name] is a very pleasant 61 y.o. male with h/o base of tongue cancer status-post chemoradiation therapy who presents today for a 4 month dental cleaning and updated radiographs. Medical and dental history were reviewed with the patient.  No reported changes.  His last cleaning was on 03/06/2021 during his comprehensive exam visit. He currently denies any dental/orofacial pain or sensitivity.  Patient is able to manage oral secretions.  Patient denies dysphagia, odynophagia, dysphonia, SOB and neck pain.  Patient denies fever, rigors and malaise.   CHIEF COMPLAINT: Here for a 4 month recall visit; patient has no complaints.   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   Incipient enamel caries 03/08/2021   Chronic periodontal disease 03/08/2021   Acquired lymphedema 01/11/2021   History of head and neck radiation 01/07/2021   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   Dysgeusia 01/07/2021    Accretions on teeth 01/07/2021   Pancytopenia, acquired ([place]) 12/07/2020   Dry mouth 12/07/2020   Leukopenia due to antineoplastic chemotherapy ([place]) 11/08/2020   Port-A-Cath in place 10/03/2020   Intractable hiccups 10/03/2020   Thrombocytopenia ([place]) 10/03/2020   Chemotherapy-induced fatigue 09/27/2020   Weight loss, unintentional 06/01/2020   Malnutrition of moderate degree 05/28/2020   GERD (gastroesophageal reflux disease)    Volume depletion    Hyperbilirubinemia    Hyponatremia    Mild protein-calorie malnutrition ([place])    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[place]) 05/17/2020   Mediastinal adenopathy 05/17/2020   Past Medical History:  Diagnosis Date   Asthma    Cancer ([place])    GERD (gastroesophageal reflux disease)    Sleep apnea    CPAP- does not wear   Past Surgical History:  Procedure Laterality Date   BRONCHIAL NEEDLE ASPIRATION BIOPSY  05/22/2020   Procedure: BRONCHIAL NEEDLE ASPIRATION BIOPSIES;  Surgeon: Garner Nash, DO;  Location: [facility];  Service: Pulmonary;;   DIRECT LARYNGOSCOPY  05/09/2020   Procedure: DIRECT LARYNGOSCOPY;  Surgeon: Rozetta Nunnery, MD;  Location: [facility];  Service: ENT;;   IR GASTROSTOMY TUBE MOD SED  09/13/2020   IR GASTROSTOMY TUBE REMOVAL  12/14/2020   IR IMAGING GUIDED PORT INSERTION  06/12/2020   MASS EXCISION N/A 05/09/2020   Procedure: EXCISION TONGUE MASS;  Surgeon: Rozetta Nunnery, MD;  [facility];  Service: ENT;  Laterality: N/A;   TONSILLECTOMY     TRACHEOSTOMY TUBE PLACEMENT  05/09/2020   Procedure: TRACHEOSTOMY;  Surgeon: Rozetta Nunnery, MD;  Location: [facility];  Service: ENT;;   VIDEO BRONCHOSCOPY WITH ENDOBRONCHIAL ULTRASOUND N/A  05/22/2020   Procedure: VIDEO BRONCHOSCOPY WITH ENDOBRONCHIAL ULTRASOUND;  Surgeon: Garner Nash, DO;  Location: [place];  Service: Pulmonary;  Laterality: N/A;   No Known Allergies Current Outpatient Medications  Medication Sig Dispense Refill   albuterol  (PROVENTIL) (2.5 MG/3ML) 0.083% nebulizer solution Take 3 mLs (2.5 mg total) by nebulization every 6 (six) hours as needed for wheezing or shortness of breath. 120 mL 12   albuterol (VENTOLIN HFA) 108 (90 Base) MCG/ACT inhaler Inhale 2 puffs into the lungs as needed for wheezing or shortness of breath.     cetirizine (ZYRTEC) 10 MG tablet Take 10 mg by mouth daily.     fluticasone (FLONASE) 50 MCG/ACT nasal spray Place 2 sprays into both nostrils daily. Place 1 spray each nostril at night 16 g 11   Fluticasone-Salmeterol (ADVAIR) 100-50 MCG/DOSE AEPB Inhale 1 puff into the lungs daily.     gabapentin (NEURONTIN) 300 MG capsule Take 1 capsule (300 mg total) by mouth at bedtime. 30 capsule 1   omeprazole (PRILOSEC) 40 MG capsule Take 40 mg by mouth daily.     sodium fluoride (SODIUM FLUORIDE 5000 PPM) 1.1 % GEL dental gel Place pea-size drop into each tooth space of fluoride trays once a day at bedtime. Leave trays in mouth for 5 minutes, then remove and spit out excess fluoride. Do not rinse with water, eat or drink for at least 30 minutes after use. 120 mL 11   No current facility-administered medications for this visit.   Facility-Administered Medications Ordered in Other Visits  Medication Dose Route Frequency Provider Last Rate Last Admin   magnesium sulfate 2 GM/50ML IVPB             LABS: Lab Results  Component Value Date   WBC 5.1 05/28/2021   HGB 11.9 (L) 05/28/2021   HCT 35.9 (L) 05/28/2021   MCV 94.2 05/28/2021   PLT 133 (L) 05/28/2021      Component Value Date/Time   NA 139 05/28/2021 1245   K 4.0 05/28/2021 1245   CL 106 05/28/2021 1245   CO2 30 05/28/2021 1245   GLUCOSE 88 05/28/2021 1245   BUN 21 (H) 05/28/2021 1245   CREATININE 1.20 05/28/2021 1245   CALCIUM 9.4 05/28/2021 1245   GFRNONAA >60 05/28/2021 1245   Lab Results  Component Value Date   INR 1.0 09/13/2020   INR 1.3 (H) 05/27/2020   No results found for: "PTT"  Social History   Socioeconomic History    Marital status: Single    Spouse name: Not on file   Number of children: Not on file   Years of education: Not on file   Highest education level: Not on file  Occupational History   Not on file  Tobacco Use   Smoking status: Never   Smokeless tobacco: Never  Vaping Use   Vaping Use: Never used  Substance and Sexual Activity   Alcohol use: Not Currently    Comment: "4-5 drinks a year for special ocassions" 05/08/20   Drug use: Never   Sexual activity: Not Currently  Other Topics Concern   Not on file  Social History Narrative   Not on file   Social Determinants of Health   Financial Resource Strain: Low Risk  (06/05/2020)   Overall Financial Resource Strain (CARDIA)    Difficulty of Paying Living Expenses: Not hard at all  Food Insecurity: No Food Insecurity (06/05/2020)   Hunger Vital Sign    Worried About Estate manager/land agent of Food  in the Last Year: Never true    [place] in the Last Year: Never true  Transportation Needs: No Transportation Needs (06/05/2020)   PRAPARE - Hydrologist (Medical): No    Lack of Transportation (Non-Medical): No  Physical Activity: Not on file  Stress: No Stress Concern Present (06/05/2020)   [place]    Feeling of Stress : Not at all  Social Connections: Socially Isolated (06/05/2020)   Social Connection and Isolation Panel [NHANES]    Frequency of Communication with Friends and Family: More than three times a week    Frequency of Social Gatherings with Friends and Family: More than three times a week    Attends Religious Services: Never    Marine scientist or Organizations: No    Attends Archivist Meetings: Never    Marital Status: Separated  Intimate Partner Violence: Not on file   Family History  Problem Relation Age of Onset   Breast cancer Mother    Cancer - Cervical Mother    Colon cancer Father    Colon cancer Brother       REVIEW OF SYSTEMS:  Reviewed with the patient as per HPI. Psych: (++) Dental phobia   VITAL SIGNS: BP 118/74 (BP Location: Right Arm, Patient Position: Sitting, Cuff Size: Normal)   Pulse 86   Temp 98.9 F (37.2 C) (Oral)    RADIOGRAPHIC EXAM:  Updated radiographs included 4 BW's that were exposed and interpreted.   Localized mild horizontal bone loss consistent with mild periodontitis. Radiographic calculus evident. Missing teeth, existing restorations. Caries-  #1MOD, #2D, #12D, #16O, #17O, #55M, #20MOD, #21D, #28D, #20M&D & #33M&D.   ASSESSMENT:  1.  History of radiation therapy to the head and neck region 2.  Routine dental cleaning  3.  Caries 4.  Xerostomia due to radiotherapy  5.  Accretions on teeth 6.  Chronic periodontitis 7.  Dental phobia 8.  Incipient lesions   PROCEDURES: Adult prophylaxis. Removed all supra- and subgingival calculus and plaque using Cavitron and hand instruments. Polished all teeth.  Flossed. Oral hygiene instruction discussed with the patient.  Showed patient with patient mirror where he was missing when he brushes his teeth and where plaque build-up was accumulating. Demonstrating brushing technique with the patient on how to reach these areas and ensure he is accessing all surfaces of his teeth. Toothbrush, toothpaste and floss given to the patient.   PLAN AND RECOMMENDATIONS: I explained all significant findings of today's dental visit with the patient and the recommended care including returning for fillings on teeth with cavities as previously discussed/treatment planned.  The patient verbalized understanding of all findings, discussion, and recommendations. Continue with 4 month recall (cleaning) interval; 6 month interval- update BW's + periodic exams. Call if any questions or concerns arise before your next visit. The patient expressed how nervous/anxious he is about having fillings done due to his severe dental phobia. I  offered to prescribe him Valium to take prior to his next visit (90 minutes before) and he can see if that helps with his anxiety.  He elected to try this and we discussed that he will need someone to drive him to his next dental visit and back home.  He verbalized understanding.  Rx:  Diazepam to take 60-90 minutes before next dental appointment   Next visit:  Restorative per treatment plan  All questions and concerns were invited and  addressed.  The patient tolerated today's visit well and departed in stable condition.  Charlaine Dalton, D.M.D.

## 2021-08-06 DIAGNOSIS — F40232 Fear of other medical care: Secondary | ICD-10-CM | POA: Insufficient documentation

## 2021-08-06 MED ORDER — VALIUM 5 MG PO TABS: ORAL_TABLET | ORAL | 0 refills | Status: AC

## 2021-08-21 ENCOUNTER — Encounter (HOSPITAL_COMMUNITY): Payer: PRIVATE HEALTH INSURANCE | Admitting: Dentistry

## 2021-09-11 ENCOUNTER — Ambulatory Visit (INDEPENDENT_AMBULATORY_CARE_PROVIDER_SITE_OTHER): Payer: Worker's Compensation | Admitting: Dentistry

## 2021-09-11 ENCOUNTER — Encounter (HOSPITAL_COMMUNITY): Payer: Self-pay | Admitting: Dentistry

## 2021-09-11 VITALS — BP 120/77 | HR 72 | Temp 98.0°F

## 2021-09-11 DIAGNOSIS — K032 Erosion of teeth: Secondary | ICD-10-CM | POA: Diagnosis not present

## 2021-09-11 DIAGNOSIS — K029 Dental caries, unspecified: Secondary | ICD-10-CM | POA: Diagnosis not present

## 2021-09-11 NOTE — Progress Notes (Signed)
Department of Dental Medicine    Service Date:   09/11/2021  Patient Name:  Alexander [name] Date of Birth:   12/07/1960 Medical Record Number: [id number]  TODAY'S VISIT RESTORATIVE   DIAGNOSIS: Tooth #2 caries, tooth #5 abfraction PROCEDURES: Restorative on #2 and #5 PLAN: Call if any questions or concerns arise before next visit.   NEXT VISIT:  Continue w/ restorative per treatment plan   09/11/2021   HISTORY OF PRESENT ILLNESS: Alexander [name] is a 61 y.o. male who presents today for restorative treatment on teeth numbers 2 and 5 per treatment plan. Medical and dental history reviewed with the patient.  No changes reported.   CHIEF COMPLAINT:  Here for a routine dental appointment; patient with no complaints.    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   Incipient enamel caries 03/08/2021   Chronic periodontal disease 03/08/2021   Acquired lymphedema 01/11/2021   History of head and neck radiation 01/07/2021   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   Dysgeusia 01/07/2021   Accretions on teeth 01/07/2021   Pancytopenia, acquired (HCC) 12/07/2020   Dry mouth 12/07/2020   Leukopenia due to antineoplastic chemotherapy (HCC) 11/08/2020   Port-A-Cath in place 10/03/2020   Intractable hiccups 10/03/2020   Thrombocytopenia (HCC) 10/03/2020   Chemotherapy-induced fatigue 09/27/2020   Weight loss, unintentional 06/01/2020   Malnutrition of moderate degree 05/28/2020   GERD (gastroesophageal reflux disease)    Volume depletion     Hyperbilirubinemia    Hyponatremia    Mild protein-calorie malnutrition (HCC)    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   Mediastinal adenopathy 05/17/2020   Past Medical History:  Diagnosis Date   Asthma    Cancer (HCC)    GERD (gastroesophageal reflux disease)    Sleep apnea    CPAP- does not wear   Past Surgical History:  Procedure Laterality Date   BRONCHIAL NEEDLE ASPIRATION BIOPSY  05/22/2020   Procedure: BRONCHIAL NEEDLE ASPIRATION BIOPSIES;  Surgeon: Josephine Igo, DO;  Location: [facility];  Service: Pulmonary;;   DIRECT LARYNGOSCOPY  05/09/2020   Procedure: DIRECT LARYNGOSCOPY;  Surgeon: [name], MD;  Location: [facility] OR;  Service: ENT;;   IR GASTROSTOMY TUBE MOD SED  09/13/2020   IR GASTROSTOMY TUBE REMOVAL  12/14/2020   IR IMAGING GUIDED PORT INSERTION  06/12/2020   MASS EXCISION N/A 05/09/2020   Procedure: EXCISION TONGUE MASS;  Surgeon: [name], MD;  Location: [facility] OR;  Service: ENT;  Laterality: N/A;   TONSILLECTOMY     TRACHEOSTOMY TUBE PLACEMENT  05/09/2020   Procedure: TRACHEOSTOMY;  Surgeon: [name], MD;  Location: [facility] OR;  Service: ENT;;   VIDEO BRONCHOSCOPY WITH ENDOBRONCHIAL ULTRASOUND N/A 05/22/2020   Procedure: VIDEO BRONCHOSCOPY WITH ENDOBRONCHIAL ULTRASOUND;  Surgeon: Josephine Igo, DO;  Location: [facility];  Service: Pulmonary;  Laterality: N/A;   No Known Allergies Current Outpatient Medications  Medication Sig Dispense Refill   albuterol (PROVENTIL) (2.5 MG/3ML) 0.083% nebulizer solution Take 3 mLs (2.5 mg total) by nebulization every 6 (six) hours as needed for wheezing or shortness of breath. 120  mL 12   albuterol (VENTOLIN HFA) 108 (90 Base) MCG/ACT inhaler Inhale 2 puffs into the lungs as needed for wheezing or shortness of breath.     cetirizine (ZYRTEC) 10 MG tablet Take 10 mg by mouth daily.     fluticasone (FLONASE) 50 MCG/ACT nasal spray Place 2 sprays into both nostrils daily. Place 1  spray each nostril at night 16 g 11   Fluticasone-Salmeterol (ADVAIR) 100-50 MCG/DOSE AEPB Inhale 1 puff into the lungs daily.     gabapentin (NEURONTIN) 300 MG capsule Take 1 capsule (300 mg total) by mouth at bedtime. 30 capsule 1   omeprazole (PRILOSEC) 40 MG capsule Take 40 mg by mouth daily.     sodium fluoride (SODIUM FLUORIDE 5000 PPM) 1.1 % GEL dental gel Place pea-size drop into each tooth space of fluoride trays once a day at bedtime. Leave trays in mouth for 5 minutes, then remove and spit out excess fluoride. Do not rinse with water, eat or drink for at least 30 minutes after use. 120 mL 11   No current facility-administered medications for this visit.   Facility-Administered Medications Ordered in Other Visits  Medication Dose Route Frequency Provider Last Rate Last Admin   magnesium sulfate 2 GM/50ML IVPB             LABS: Lab Results  Component Value Date   WBC 5.1 05/28/2021   HGB 11.9 (L) 05/28/2021   HCT 35.9 (L) 05/28/2021   MCV 94.2 05/28/2021   PLT 133 (L) 05/28/2021      Component Value Date/Time   NA 139 05/28/2021 1245   K 4.0 05/28/2021 1245   CL 106 05/28/2021 1245   CO2 30 05/28/2021 1245   GLUCOSE 88 05/28/2021 1245   BUN 21 (H) 05/28/2021 1245   CREATININE 1.20 05/28/2021 1245   CALCIUM 9.4 05/28/2021 1245   GFRNONAA >60 05/28/2021 1245   Lab Results  Component Value Date   INR 1.0 09/13/2020   INR 1.3 (H) 05/27/2020   No results found for: "PTT"  Social History   Socioeconomic History   Marital status: Single    Spouse name: Not on file   Number of children: Not on file   Years of education: Not on file   Highest education level: Not on file  Occupational History   Not on file  Tobacco Use   Smoking status: Never   Smokeless tobacco: Never  Vaping Use   Vaping Use: Never used  Substance and Sexual Activity   Alcohol use: Not Currently    Comment: "4-5 drinks a year for special ocassions" 05/08/20   Drug use: Never   Sexual  activity: Not Currently  Other Topics Concern   Not on file  Social History Narrative   Not on file   Social Determinants of Health   Financial Resource Strain: Low Risk  (06/05/2020)   Overall Financial Resource Strain (CARDIA)    Difficulty of Paying Living Expenses: Not hard at all  Food Insecurity: No Food Insecurity (06/05/2020)   Hunger Vital Sign    Worried About Running Out of Food in the Last Year: Never true    Ran Out of Food in the Last Year: Never true  Transportation Needs: No Transportation Needs (06/05/2020)   PRAPARE - Administrator, Civil Service (Medical): No    Lack of Transportation (Non-Medical): No  Physical Activity: Not on file  Stress: No Stress Concern Present (06/05/2020)   [employer] of Occupational [employer] -  Occupational Stress Questionnaire    Feeling of Stress : Not at all  Social Connections: Socially Isolated (06/05/2020)   Social Connection and Isolation Panel [NHANES]    Frequency of Communication with Friends and Family: More than three times a week    Frequency of Social Gatherings with Friends and Family: More than three times a week    Attends Religious Services: Never    Database administrator or Organizations: No    Attends Engineer, structural: Never    Marital Status: Separated  Intimate Partner Violence: Not on file   Family History  Problem Relation Age of Onset   Breast cancer Mother    Cancer - Cervical Mother    Colon cancer Father    Colon cancer Brother     ANTIBIOTIC PROPHYLAXIS/OTHER PREMEDICATION: Patient reports he was unable to take diazepam premedication for dental phobia today because he did not have someone to drive him to his appointment.   VITAL SIGNS: BP 120/77 (BP Location: Right Arm, Patient Position: Sitting, Cuff Size: Normal)   Pulse 72   Temp 98 F (36.7 C) (Oral)    RADIOGRAPH(S): BW taken on 07/31/21.    ASSESSMENT/INDICATION(S): Dental caries   Abfraction/flexure   PROCEDURES: Restorative treatment on teeth #2DOL and #5B(V). ANESTHESIA: Topical:  Benzocaine 20% applied Type of anesthesia used:  68 mg lidocaine, 0.036 mg epinephrine Location given:  Upper right quadrant infiltration Aspiration negative. COMPOSITE RESTORATIONS: Cotton roll isolation. Excavated decay from teeth numbers 2 and 5. Removed all existing amalgam restoration from tooth #2. Teeth numbers 2DOL and 5B(V) were prepared for composite. The extent of caries was into dentin. Placed Gluma on pulpal floor of #5 preparation. Etched enamel and dentin surfaces  with 32% phosphoric acid for 15 seconds and rinsed thoroughly. Removed excess water with a brief burst of air. Optibond bonding agent placed and air dried until no movement of bonding agent was seen and then light cured for 10 seconds. OMNICHROMA flowable composite material placed in increments and light-cured. Removed excess material with carbide finishing bur. Occlusion, margins and contacts were verified and adjusted as needed.  Restorations were finished and polished. The patient was advised of possible normal sensitivity to hot and cold for the next few days/weeks.   PLAN:   NEXT VISIT:  Continue with restorative per treatment plan   All questions and concerns were invited and addressed.  The patient tolerated today's visit well and departed in stable condition.  - Dshaun Reppucci B. Chales Salmon, DMD

## 2021-10-09 ENCOUNTER — Encounter (HOSPITAL_COMMUNITY): Payer: Self-pay | Admitting: Dentistry

## 2021-10-09 ENCOUNTER — Ambulatory Visit (INDEPENDENT_AMBULATORY_CARE_PROVIDER_SITE_OTHER): Payer: Worker's Compensation | Admitting: Dentistry

## 2021-10-09 VITALS — BP 112/77 | HR 74 | Temp 98.4°F

## 2021-10-09 DIAGNOSIS — K029 Dental caries, unspecified: Secondary | ICD-10-CM

## 2021-10-09 DIAGNOSIS — K032 Erosion of teeth: Secondary | ICD-10-CM | POA: Diagnosis not present

## 2021-10-09 NOTE — Progress Notes (Signed)
[place] Department of Dental Medicine      TODAY'S VISIT    RESTORATIVE   DIAGNOSIS: Teeth #'s 11 & 12 caries Tooth #12 abfraction PROCEDURES: Restorative on #11 and #12 PLAN:  NEXT VISIT:  Continue w/ restorative per treatment plan    Service Date:   10/09/2021  Patient Name:   Alexander [name] Date of Birth:   [birth year] Medical Record Number: [id number]   HISTORY OF PRESENT ILLNESS: Alexander [name] is a 61 y.o. male who presents today for restorative treatment on teeth numbers 11 and 12 per treatment plan. Medical and dental history reviewed with the patient.  No changes were reported.   CHIEF COMPLAINT:  Here for a routine dental appointment; patient with no complaints.   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   Incipient enamel caries 03/08/2021   Chronic periodontal disease 03/08/2021   Acquired lymphedema 01/11/2021   History of head and neck radiation 01/07/2021   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   Dysgeusia 01/07/2021   Accretions on teeth 01/07/2021   Pancytopenia, acquired ([place]) 12/07/2020   Dry mouth 12/07/2020   Leukopenia due to antineoplastic chemotherapy ([place]) 11/08/2020   Port-A-Cath in place 10/03/2020   Intractable hiccups 10/03/2020   Thrombocytopenia ([place]) 10/03/2020   Chemotherapy-induced fatigue 09/27/2020   Weight loss, unintentional 06/01/2020   Malnutrition of moderate degree 05/28/2020   GERD (gastroesophageal reflux disease)    Volume depletion    Hyperbilirubinemia     Hyponatremia    Mild protein-calorie malnutrition ([place])    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[place]) 05/17/2020   Mediastinal adenopathy 05/17/2020   Past Medical History:  Diagnosis Date   Asthma    Cancer ([place])    GERD (gastroesophageal reflux disease)    Sleep apnea    CPAP- does not wear   Past Surgical History:  Procedure Laterality Date   BRONCHIAL NEEDLE ASPIRATION BIOPSY  05/22/2020   Procedure: BRONCHIAL NEEDLE ASPIRATION BIOPSIES;  Surgeon: Garner Nash, DO;  Location: [place] [facility];  Service: Pulmonary;;   DIRECT LARYNGOSCOPY  05/09/2020   Procedure: DIRECT LARYNGOSCOPY;  Surgeon: Rozetta Nunnery, MD;  Location: [place];  Service: ENT;;   IR GASTROSTOMY TUBE MOD SED  09/13/2020   IR GASTROSTOMY TUBE REMOVAL  12/14/2020   IR IMAGING GUIDED PORT INSERTION  06/12/2020   MASS EXCISION N/A 05/09/2020   Procedure: EXCISION TONGUE MASS;  Surgeon: Rozetta Nunnery, MD;  Location: [facility] OR;  Service: ENT;  Laterality: N/A;   TONSILLECTOMY     TRACHEOSTOMY TUBE PLACEMENT  05/09/2020   Procedure: TRACHEOSTOMY;  Surgeon: Rozetta Nunnery, MD;  Location: [place];  Service: ENT;;   VIDEO BRONCHOSCOPY WITH ENDOBRONCHIAL ULTRASOUND N/A 05/22/2020   Procedure: VIDEO BRONCHOSCOPY WITH ENDOBRONCHIAL ULTRASOUND;  Surgeon: Garner Nash, DO;  Location: [place] [facility];  Service: Pulmonary;  Laterality: N/A;   No Known Allergies Current Outpatient Medications  Medication Sig Dispense Refill   albuterol (PROVENTIL) (2.5 MG/3ML) 0.083% nebulizer solution Take 3 mLs (2.5 mg total) by nebulization every 6 (six) hours as needed for wheezing or shortness of breath. [place]  mL 12   albuterol (VENTOLIN HFA) 108 (90 Base) MCG/ACT inhaler Inhale 2 puffs into the lungs as needed for wheezing or shortness of breath.     cetirizine (ZYRTEC) 10 MG tablet Take 10 mg by mouth daily.     fluticasone (FLONASE) 50 MCG/ACT nasal spray Place 2 sprays into both nostrils daily. Place 1 spray each nostril at  night 16 g 11   Fluticasone-Salmeterol (ADVAIR) 100-50 MCG/DOSE AEPB Inhale 1 puff into the lungs daily.     gabapentin (NEURONTIN) 300 MG capsule Take 1 capsule (300 mg total) by mouth at bedtime. 30 capsule 1   omeprazole (PRILOSEC) 40 MG capsule Take 40 mg by mouth daily.     sodium fluoride (SODIUM FLUORIDE 5000 PPM) 1.1 % GEL dental gel Place pea-size drop into each tooth space of fluoride trays once a day at bedtime. Leave trays in mouth for 5 minutes, then remove and spit out excess fluoride. Do not rinse with water, eat or drink for at least 30 minutes after use. 120 mL 11   No current facility-administered medications for this visit.   Facility-Administered Medications Ordered in Other Visits  Medication Dose Route Frequency Provider Last Rate Last Admin   magnesium sulfate 2 GM/50ML IVPB             LABS: Lab Results  Component Value Date   WBC 5.1 05/28/2021   HGB 11.9 (L) 05/28/2021   HCT 35.9 (L) 05/28/2021   MCV 94.2 05/28/2021   PLT 133 (L) 05/28/2021      Component Value Date/Time   NA 139 05/28/2021 1245   K 4.0 05/28/2021 1245   CL 106 05/28/2021 1245   CO2 30 05/28/2021 1245   GLUCOSE 88 05/28/2021 1245   BUN 21 (H) 05/28/2021 1245   CREATININE 1.20 05/28/2021 1245   CALCIUM 9.4 05/28/2021 1245   GFRNONAA >60 05/28/2021 1245   Lab Results  Component Value Date   INR 1.0 09/13/2020   INR 1.3 (H) 05/27/2020   No results found for: "PTT"  Social History   Socioeconomic History   Marital status: Single    Spouse name: Not on file   Number of children: Not on file   Years of education: Not on file   Highest education level: Not on file  Occupational History   Not on file  Tobacco Use   Smoking status: Never   Smokeless tobacco: Never  Vaping Use   Vaping Use: Never used  Substance and Sexual Activity   Alcohol use: Not Currently    Comment: "4-5 drinks a year for special ocassions" 05/08/20   Drug use: Never   Sexual activity: Not Currently   Other Topics Concern   Not on file  Social History Narrative   Not on file   Social Determinants of Health   Financial Resource Strain: Low Risk  (06/05/2020)   Overall Financial Resource Strain (CARDIA)    Difficulty of Paying Living Expenses: Not hard at all  Food Insecurity: No Food Insecurity (06/05/2020)   Hunger Vital Sign    Worried About Running Out of Food in the Last Year: Never true    Ran Out of Food in the Last Year: Never true  Transportation Needs: No Transportation Needs (06/05/2020)   PRAPARE - Hydrologist (Medical): No    Lack of Transportation (Non-Medical): No  Physical Activity: Not on file  Stress: No Stress Concern Present (06/05/2020)   [place] -  Occupational Stress Questionnaire    Feeling of Stress : Not at all  Social Connections: Socially Isolated (06/05/2020)   Social Connection and Isolation Panel [NHANES]    Frequency of Communication with Friends and Family: More than three times a week    Frequency of Social Gatherings with Friends and Family: More than three times a week    Attends Religious Services: Never    Marine scientist or Organizations: No    Attends Archivist Meetings: Never    Marital Status: Separated  Intimate Partner Violence: Not on file   Family History  Problem Relation Age of Onset   Breast cancer Mother    Cancer - Cervical Mother    Colon cancer Father    Colon cancer Brother     ANTIBIOTIC PROPHYLAXIS/OTHER PREMEDICATION: [ None indicated ]   VITAL SIGNS: BP 112/77 (BP Location: Right Arm, Patient Position: Sitting, Cuff Size: Normal)   Pulse 74   Temp 98.4 F (36.9 C) (Oral)    ASSESSMENT/INDICATION(S): #11L and #12O caries #12B(V) abfraction/flexure   PROCEDURES: Restorative treatment on #11L, #12O and #12B(V). ANESTHESIA: Topical:  Benzocaine 20% applied Type of anesthesia used:  34 mg lidocaine, 0.018 mg  epinephrine Location given:  #11 and #12 infiltration Aspiration negative. COMPOSITE RESTORATIONS: Cotton roll isolation. Excavated decay from teeth numbers 11 and 12. Roughened enamel and dentinal surfaces of tooth number 12 buccal. Teeth numbers #11L, #12O and #12B(V) were prepared for composite. The extent of caries was into dentin. Etched enamel and dentin surfaces  with 32% phosphoric acid for 15 seconds and rinsed thoroughly. Removed excess water with a brief burst of air. Optibond bonding agent placed and air dried until no movement of bonding agent was seen and then light cured for 10 seconds. OMNICHROMA flowable composite material was placed in increments and light-cured. Removed excess material with carbide finishing bur. Occlusion and margins were verified and adjusted as needed.  Restorations finished and polished. The patient was advised of possible normal sensitivity to hot and cold for the next few days/weeks.   PLAN:   NEXT VISIT:  Continue with restorative per treatment plan (lower left)  All questions and concerns were invited and addressed.  The patient tolerated today's visit well and departed in stable condition.  -Sandi Mariscal, DMD

## 2021-11-20 ENCOUNTER — Encounter (HOSPITAL_COMMUNITY): Payer: Self-pay | Admitting: Dentistry

## 2021-11-20 ENCOUNTER — Ambulatory Visit (INDEPENDENT_AMBULATORY_CARE_PROVIDER_SITE_OTHER): Payer: Worker's Compensation | Admitting: Dentistry

## 2021-11-20 VITALS — BP 123/70 | HR 75 | Temp 98.4°F

## 2021-11-20 DIAGNOSIS — K029 Dental caries, unspecified: Secondary | ICD-10-CM | POA: Diagnosis not present

## 2021-11-20 DIAGNOSIS — K032 Erosion of teeth: Secondary | ICD-10-CM | POA: Diagnosis not present

## 2021-11-20 NOTE — Progress Notes (Signed)
[facility]      TODAY'S VISIT:    RESTORATIVE   DIAGNOSIS/INDICATION(S): #28 & #29 caries #28 Abfraction/flexure PROCEDURES: Restorative on #28 & #29 PLAN/RECOMMENDATIONS: Call if any questions or concerns arise before your next dental visit.  NEXT VISIT:  Continue w/ restorative per treatment plan    Service Date:   11/20/2021  Patient Name:   Alexander [name] Date of Birth:   [birth year] Medical Record Number: [id number]   HISTORY OF PRESENT ILLNESS: Alexander [name] is a 61 y.o. male who presents today for restorative treatment on teeth numbers 28 and 29 per treatment plan. Medical and dental history were reviewed with the patient.  No changes reported.   CHIEF COMPLAINT:  Here for a routine dental appointment; patient with no complaints.   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   Chemotherapy-induced peripheral neuropathy ([place]) 05/28/2021   Encounter for dental examination and cleaning without abnormal findings 03/08/2021   Teeth missing 03/08/2021   Caries 03/08/2021   Defective dental restoration 03/08/2021   Excessive dental attrition 03/08/2021   Abfraction 03/08/2021   Diastema 03/08/2021   Malocclusion 03/08/2021   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   Incipient enamel caries 03/08/2021   Chronic periodontal disease 03/08/2021   Acquired lymphedema 01/11/2021   History of head and neck radiation 01/07/2021   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   Dysgeusia 01/07/2021   Accretions on teeth 01/07/2021   Pancytopenia, acquired ([place]) 12/07/2020   Dry mouth 12/07/2020   Leukopenia due to antineoplastic chemotherapy ([place]) 11/08/2020   Port-A-Cath in place 10/03/2020   Intractable hiccups 10/03/2020   Thrombocytopenia ([place]) 10/03/2020   Chemotherapy-induced fatigue 09/27/2020   Weight loss, unintentional 06/01/2020   Malnutrition of moderate degree 05/28/2020    GERD (gastroesophageal reflux disease)    Volume depletion    Hyperbilirubinemia    Hyponatremia    Mild protein-calorie malnutrition ([place])    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/17/2020   Mediastinal adenopathy 05/17/2020   Past Medical History:  Diagnosis Date   Asthma    Cancer ([place])    GERD (gastroesophageal reflux disease)    Sleep apnea    CPAP- does not wear   Past Surgical History:  Procedure Laterality Date   BRONCHIAL NEEDLE ASPIRATION BIOPSY  05/22/2020   Procedure: BRONCHIAL NEEDLE ASPIRATION BIOPSIES;  Surgeon: Garner Nash, DO;  Location: [facility];  Service: Pulmonary;;   DIRECT LARYNGOSCOPY  05/09/2020   Procedure: DIRECT LARYNGOSCOPY;  Surgeon: Rozetta Nunnery, MD;  Location: [facility];  Service: ENT;;   IR GASTROSTOMY TUBE MOD SED  09/13/2020   IR GASTROSTOMY TUBE REMOVAL  12/14/2020   IR IMAGING GUIDED PORT INSERTION  06/12/2020   MASS EXCISION N/A 05/09/2020   Procedure: EXCISION TONGUE MASS;  Surgeon: Rozetta Nunnery, MD;  Location: [facility];  Service: ENT;  Laterality: N/A;   TONSILLECTOMY     TRACHEOSTOMY TUBE PLACEMENT  05/09/2020   Procedure: TRACHEOSTOMY;  Surgeon: Rozetta Nunnery, MD;  Location: [facility];  Service: ENT;;   VIDEO BRONCHOSCOPY WITH ENDOBRONCHIAL ULTRASOUND N/A 05/22/2020   Procedure: VIDEO BRONCHOSCOPY WITH ENDOBRONCHIAL ULTRASOUND;  Surgeon: Garner Nash, DO;  Location: [facility];  Service: Pulmonary;  Laterality: N/A;   No Known Allergies Current Outpatient Medications  Medication Sig Dispense Refill   albuterol (PROVENTIL) (2.5 MG/3ML) 0.083% nebulizer solution Take 3 mLs (2.5 mg total) by nebulization every 6 (six) hours  as needed for wheezing or shortness of breath. 120 mL 12   albuterol (VENTOLIN HFA) 108 (90 Base) MCG/ACT inhaler Inhale 2 puffs into the lungs as needed for wheezing or shortness of breath.     cetirizine (ZYRTEC) 10 MG tablet Take 10 mg by mouth daily.     fluticasone (FLONASE) 50  MCG/ACT nasal spray Place 2 sprays into both nostrils daily. Place 1 spray each nostril at night 16 g 11   Fluticasone-Salmeterol (ADVAIR) 100-50 MCG/DOSE AEPB Inhale 1 puff into the lungs daily.     gabapentin (NEURONTIN) 300 MG capsule Take 1 capsule (300 mg total) by mouth at bedtime. 30 capsule 1   omeprazole (PRILOSEC) 40 MG capsule Take 40 mg by mouth daily.     sodium fluoride (SODIUM FLUORIDE 5000 PPM) 1.1 % GEL dental gel Place pea-size drop into each tooth space of fluoride trays once a day at bedtime. Leave trays in mouth for 5 minutes, then remove and spit out excess fluoride. Do not rinse with water, eat or drink for at least 30 minutes after use. 120 mL 11   No current facility-administered medications for this visit.   Facility-Administered Medications Ordered in Other Visits  Medication Dose Route Frequency Provider Last Rate Last Admin   magnesium sulfate 2 GM/50ML IVPB             LABS: Lab Results  Component Value Date   WBC 5.1 05/28/2021   HGB 11.9 (L) 05/28/2021   HCT 35.9 (L) 05/28/2021   MCV 94.2 05/28/2021   PLT 133 (L) 05/28/2021      Component Value Date/Time   NA 139 05/28/2021 1245   K 4.0 05/28/2021 1245   CL 106 05/28/2021 1245   CO2 30 05/28/2021 1245   GLUCOSE 88 05/28/2021 1245   BUN 21 (H) 05/28/2021 1245   CREATININE 1.20 05/28/2021 1245   CALCIUM 9.4 05/28/2021 1245   GFRNONAA >60 05/28/2021 1245   Lab Results  Component Value Date   INR 1.0 09/13/2020   INR 1.3 (H) 05/27/2020   No results found for: "PTT"  Social History   Socioeconomic History   Marital status: Single    Spouse name: Not on file   Number of children: Not on file   Years of education: Not on file   Highest education level: Not on file  Occupational History   Not on file  Tobacco Use   Smoking status: Never   Smokeless tobacco: Never  Vaping Use   Vaping Use: Never used  Substance and Sexual Activity   Alcohol use: Not Currently    Comment: "4-5 drinks a  year for special ocassions" 05/08/20   Drug use: Never   Sexual activity: Not Currently  Other Topics Concern   Not on file  Social History Narrative   Not on file   Social Determinants of Health   Financial Resource Strain: Low Risk  (06/05/2020)   Overall Financial Resource Strain (CARDIA)    Difficulty of Paying Living Expenses: Not hard at all  Food Insecurity: No Food Insecurity (06/05/2020)   Hunger Vital Sign    Worried About Running Out of Food in the Last Year: Never true    Ran Out of Food in the Last Year: Never true  Transportation Needs: No Transportation Needs (06/05/2020)   PRAPARE - Hydrologist (Medical): No    Lack of Transportation (Non-Medical): No  Physical Activity: Not on file  Stress: No Stress Concern Present (  06/05/2020)   [place]    Feeling of Stress : Not at all  Social Connections: Socially Isolated (06/05/2020)   Social Connection and Isolation Panel [NHANES]    Frequency of Communication with Friends and Family: More than three times a week    Frequency of Social Gatherings with Friends and Family: More than three times a week    Attends Religious Services: Never    Marine scientist or Organizations: No    Attends Music therapist: Never    Marital Status: Separated  Intimate Partner Violence: Not on file   Family History  Problem Relation Age of Onset   Breast cancer Mother    Cancer - Cervical Mother    Colon cancer Father    Colon cancer Brother     ANTIBIOTIC PROPHYLAXIS/OTHER PREMEDICATION: [ N/A ]   VITAL SIGNS: BP 123/70 (BP Location: Right Arm, Patient Position: Sitting, Cuff Size: Normal)   Pulse 75   Temp 98.4 F (36.9 C) (Oral)    RADIOGRAPH(S): Updated BW's were taken on 07/31/21.   ASSESSMENT/INDICATION(S): Caries on #28 and #29 Abfraction/flexure #28   PROCEDURES: Restorative treatment on #28 B(V)DO  and #29 O. ANESTHESIA: Topical:  Benzocaine 20% applied Type of anesthesia used:  34 mg lidocaine, 0.018 mg epinephrine Location given:  Lower right quadrant mental nerve block with #28-#29 buccal infiltration. Aspiration negative. COMPOSITE RESTORATIONS: Cotton roll isolation. Excavated decay from teeth numbers 28 and 29. Teeth numbers 28 B(V)DO and 29 O were prepared for composite. The extent of caries was into dentin. Etched enamel and dentin surfaces  with 32% phosphoric acid for 15 seconds and rinsed thoroughly. Removed excess water with a brief burst of air. Optibond bonding agent placed and air dried until no movement of bonding agent was seen and then light cured for 10 seconds.  OMNICHROMA flowable composite material placed in increments and light-cured. Removed excess material with carbide finishing bur. Occlusion, margins and contacts were verified and adjusted as needed.  Restorations were finished and polished. The patient was advised of possible normal sensitivity to hot and cold for the next few days/weeks.   PLAN:  Call if any questions or concerns arise before your next dental visit.   NEXT VISIT:  Continue with restorative per treatment plan  All questions and concerns were invited and addressed.  The patient tolerated today's visit well and departed in stable condition.  -Sandi Mariscal, DMD

## 2021-11-21 ENCOUNTER — Ambulatory Visit (HOSPITAL_COMMUNITY)
Admission: RE | Admit: 2021-11-21 | Discharge: 2021-11-21 | Disposition: A | Discharge status: Home / Self Care | Payer: PRIVATE HEALTH INSURANCE | Source: Ambulatory Visit | Attending: Hematology and Oncology | Admitting: Hematology and Oncology

## 2021-11-21 DIAGNOSIS — C01 Malignant neoplasm of base of tongue: Secondary | ICD-10-CM | POA: Insufficient documentation

## 2021-11-21 LAB — POCT I-STAT CREATININE: Creatinine, Ser: 1 mg/dL (ref 0.61–1.24)

## 2021-11-21 MED ORDER — IOHEXOL 300 MG/ML  SOLN
75.0000 mL | Freq: Once | INTRAMUSCULAR | Status: AC | PRN
  Administered 2021-11-21: 75 mL via INTRAVENOUS

## 2021-11-21 MED ORDER — HEPARIN SOD (PORK) LOCK FLUSH 100 UNIT/ML IV SOLN
INTRAVENOUS | Status: AC
  Administered 2021-11-21: 500 [IU]
  Filled 2021-11-21: qty 5

## 2021-11-21 MED ORDER — SODIUM CHLORIDE (PF) 0.9 % IJ SOLN
INTRAMUSCULAR | Status: AC
  Filled 2021-11-21: qty 50

## 2021-11-27 ENCOUNTER — Other Ambulatory Visit: Payer: Self-pay | Admitting: *Deleted

## 2021-11-27 DIAGNOSIS — C01 Malignant neoplasm of base of tongue: Secondary | ICD-10-CM

## 2021-11-28 ENCOUNTER — Inpatient Hospital Stay: Payer: PRIVATE HEALTH INSURANCE

## 2021-11-28 ENCOUNTER — Inpatient Hospital Stay: Payer: PRIVATE HEALTH INSURANCE | Attending: Hematology and Oncology | Admitting: Hematology and Oncology

## 2021-11-28 ENCOUNTER — Other Ambulatory Visit: Payer: Self-pay

## 2021-11-28 ENCOUNTER — Encounter: Payer: Self-pay | Admitting: Hematology and Oncology

## 2021-11-28 DIAGNOSIS — Z79899 Other long term (current) drug therapy: Secondary | ICD-10-CM | POA: Insufficient documentation

## 2021-11-28 DIAGNOSIS — D696 Thrombocytopenia, unspecified: Secondary | ICD-10-CM | POA: Insufficient documentation

## 2021-11-28 DIAGNOSIS — C01 Malignant neoplasm of base of tongue: Secondary | ICD-10-CM | POA: Diagnosis present

## 2021-11-28 DIAGNOSIS — Z95828 Presence of other vascular implants and grafts: Secondary | ICD-10-CM

## 2021-11-28 LAB — CBC WITH DIFFERENTIAL (CANCER CENTER ONLY)
Abs Immature Granulocytes: 0.02 10*3/uL (ref 0.00–0.07)
Basophils Absolute: 0 10*3/uL (ref 0.0–0.1)
Basophils Relative: 1 %
Eosinophils Absolute: 0.2 10*3/uL (ref 0.0–0.5)
Eosinophils Relative: 4 %
HCT: 39.8 % (ref 39.0–52.0)
Hemoglobin: 13.5 g/dL (ref 13.0–17.0)
Immature Granulocytes: 0 %
Lymphocytes Relative: 18 %
Lymphs Abs: 0.9 10*3/uL (ref 0.7–4.0)
MCH: 31.9 pg (ref 26.0–34.0)
MCHC: 33.9 g/dL (ref 30.0–36.0)
MCV: 94.1 fL (ref 80.0–100.0)
Monocytes Absolute: 0.4 10*3/uL (ref 0.1–1.0)
Monocytes Relative: 8 %
Neutro Abs: 3.4 10*3/uL (ref 1.7–7.7)
Neutrophils Relative %: 69 %
Platelet Count: 140 10*3/uL — ABNORMAL LOW (ref 150–400)
RBC: 4.23 MIL/uL (ref 4.22–5.81)
RDW: 13.9 % (ref 11.5–15.5)
WBC Count: 4.9 10*3/uL (ref 4.0–10.5)
nRBC: 0 % (ref 0.0–0.2)

## 2021-11-28 LAB — TSH: TSH: 3.338 u[IU]/mL (ref 0.350–4.500)

## 2021-11-28 LAB — CMP (CANCER CENTER ONLY)
ALT: 10 U/L (ref 0–44)
AST: 12 U/L — ABNORMAL LOW (ref 15–41)
Albumin: 4 g/dL (ref 3.5–5.0)
Alkaline Phosphatase: 44 U/L (ref 38–126)
Anion gap: 4 — ABNORMAL LOW (ref 5–15)
BUN: 16 mg/dL (ref 8–23)
CO2: 30 mmol/L (ref 22–32)
Calcium: 9.1 mg/dL (ref 8.9–10.3)
Chloride: 105 mmol/L (ref 98–111)
Creatinine: 1.12 mg/dL (ref 0.61–1.24)
GFR, Estimated: >60 mL/min (ref >60)
Glucose, Bld: 108 mg/dL — ABNORMAL HIGH (ref 70–99)
Potassium: 4.1 mmol/L (ref 3.5–5.1)
Sodium: 139 mmol/L (ref 135–145)
Total Bilirubin: 0.8 mg/dL (ref 0.3–1.2)
Total Protein: 6.7 g/dL (ref 6.5–8.1)

## 2021-11-28 MED ORDER — HEPARIN SOD (PORK) LOCK FLUSH 100 UNIT/ML IV SOLN
500.0000 [IU] | Freq: Once | INTRAVENOUS | Status: AC
  Administered 2021-11-28: 500 [IU]

## 2021-11-28 MED ORDER — SODIUM CHLORIDE 0.9% FLUSH
10.0000 mL | Freq: Once | INTRAVENOUS | Status: AC
  Administered 2021-11-28: 10 mL

## 2021-11-28 NOTE — Progress Notes (Signed)
[facility] OFFICE PROGRESS NOTE  Patient Care Team: System, Provider Not In as PCP - General [place], Stephani Police, RN as Oncology Nurse Navigator Benay Pike, MD as Consulting Physician (Hematology and Oncology) Rozetta Nunnery, MD (Inactive) as Consulting Physician (Otolaryngology) Eppie Gibson, MD as Consulting Physician (Radiation Oncology)  ASSESSMENT & PLAN:   Squamous cell carcinoma of base of tongue [facility]) This is a very pleasant 61 year old male patient with past medical history significant for obstructive sleep apnea, otherwise in excellent health who was recently diagnosed with squamous cell carcinoma of the base of the tongue, p16 positive, 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    He completed 4 cycles of 5-FU/carbo/Keytruda for induction.   He had remarkable response and no toxicity to report He was discussed in the head and neck tumor board and the plan was to consider concurrent chemoradiation to the neck and radiation to the mediastinum given oligometastatic disease.   He clearly understands this is not necessarily curative in intent however may give him prolonged progression free interval.   He started weekly cisplatin 09/19/2020 He received 6 weekly cycles of cisplatin concomintant with radiation. EOT PET with complete response He is here for follow up. His most recent imaging with no new findings concerning for recurrence. Given oligometastatic disease at presentation, we have discussed about doing routine imaging every 6 months. He mentions that unfortunately I am not part of the [facility] network and he was told that he may not be able to see [place] in the future.  We will look into this again.  I have sent an in basket message to Dr. Isidore Moos if she can order the CT neck and CT chest in 6 months from now.  He has an ENT  follow-up tomorrow.  No other concerns on review of systems or physical examination today.  CBC from today with mild thrombocytopenia, okay to monitor.  We will follow-up on the TSH as well.   No orders of the defined types were placed in this encounter.  Benay Pike, MD 11/28/2021 1:18 PM  INTERVAL HISTORY:  Ms. Huttner is here for follow-up.   He is doing quite well today.  Since his last visit, he is now maintaining his weight around 250 pounds.  He denies any difficulty swallowing.  No change in breathing or new onset cough.  He did see ENT back in May and has a follow-up with them tomorrow again.  He is getting married next April and is quite excited.  He has not quite gone back to the gym yet but is looking into it.  Rest of the pertinent 10 point ROS reviewed and negative I have reviewed the past medical history, past surgical history, social history and family history with the patient and they are unchanged from previous note.  ALLERGIES:  has No Known Allergies.  MEDICATIONS:  Current Outpatient Medications  Medication Sig Dispense Refill   albuterol (PROVENTIL) (2.5 MG/3ML) 0.083% nebulizer solution Take 3 mLs (2.5 mg total) by nebulization every 6 (six) hours as needed for wheezing or shortness of breath. 120 mL 12   albuterol (VENTOLIN HFA) 108 (90 Base) MCG/ACT inhaler Inhale 2 puffs into the lungs as needed for wheezing or shortness of breath.     cetirizine (ZYRTEC) 10 MG tablet Take 10 mg by mouth daily.     fluticasone (FLONASE) 50 MCG/ACT nasal spray Place  2 sprays into both nostrils daily. Place 1 spray each nostril at night 16 g 11   Fluticasone-Salmeterol (ADVAIR) 100-50 MCG/DOSE AEPB Inhale 1 puff into the lungs daily.     gabapentin (NEURONTIN) 300 MG capsule Take 1 capsule (300 mg total) by mouth at bedtime. 30 capsule 1   omeprazole (PRILOSEC) 40 MG capsule Take 40 mg by mouth daily.     sodium fluoride (SODIUM FLUORIDE 5000 PPM) 1.1 % GEL dental gel Place  pea-size drop into each tooth space of fluoride trays once a day at bedtime. Leave trays in mouth for 5 minutes, then remove and spit out excess fluoride. Do not rinse with water, eat or drink for at least 30 minutes after use. 120 mL 11   No current facility-administered medications for this visit.   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[place])  05/17/2020 Initial Diagnosis   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[place])   05/18/2020 Cancer Staging   Staging form: Pharynx - HPV-Mediated Oropharynx, AJCC 8th Edition - Clinical stage from 05/18/2020: Stage IV (cT4, cN3, cM1, p16+) - Signed by Eppie Gibson, MD on 12/10/2020   06/01/2020 Cancer Staging   Staging form: Pharynx - HPV-Mediated Oropharynx, AJCC 8th Edition - Pathologic stage from 06/01/2020: No Stage Recommended (cT4, cN3, cM1, p16+) - Signed by Benay Pike, MD on 06/01/2020 Stage prefix: Initial diagnosis   06/12/2020 - 08/21/2020 Chemotherapy         09/10/2020 - 09/10/2020 Chemotherapy         09/19/2020 - 11/09/2020 Chemotherapy   Patient is on Treatment Plan : HEAD/NECK Cisplatin q7d     02/15/2021 Imaging   Improved findings when compared to prior PET-CT suggesting a good response to treatment. Small focus of residual hypermetabolism in the anterior aspect of the floor the mouth and small residual slightly hypermetabolic level 2 lymph nodes. Recommend continued surveillance. No findings for metastatic disease involving the chest, abdomen or pelvis.Small patchy infiltrates in the right lung.      PHYSICAL EXAMINATION: ECOG PERFORMANCE STATUS: 1 - Symptomatic but completely ambulatory  Vitals:   11/28/21 1303  BP: 101/62  Pulse: 80  Resp: 16  Temp: 98.1 F (36.7 C)  SpO2: 98%   Filed  Weights   11/28/21 1303  Weight: 259 lb 3.2 oz (117.6 kg)    Physical Exam Constitutional:      Appearance: Normal appearance.  HENT:     Head: Normocephalic and atraumatic.  Eyes:     Pupils: Pupils are equal, round, and reactive to light.  Cardiovascular:     Rate and Rhythm: Normal rate and regular rhythm.     Pulses: Normal pulses.     Heart sounds: Normal heart sounds.  Pulmonary:     Effort: Pulmonary effort is normal.     Breath sounds: Normal breath sounds.  Abdominal:     General: Abdomen is flat. Bowel sounds are normal.  Musculoskeletal:        General: Normal range of motion.     Cervical back: Normal range of motion and neck supple. No rigidity.  Lymphadenopathy:     Cervical: No cervical adenopathy.  Skin:    General: Skin is warm and dry.  Neurological:     General:  No focal deficit present.     Mental Status: He is alert.      LABORATORY DATA:  I have reviewed the data as listed    Component Value Date/Time   NA 139 11/28/2021 1221   K 4.1 11/28/2021 1221   CL 105 11/28/2021 1221   CO2 30 11/28/2021 1221   GLUCOSE 108 (H) 11/28/2021 1221   BUN 16 11/28/2021 1221   CREATININE 1.12 11/28/2021 1221   CALCIUM 9.1 11/28/2021 1221   PROT 6.7 11/28/2021 1221   ALBUMIN 4.0 11/28/2021 1221   AST 12 (L) 11/28/2021 1221   ALT 10 11/28/2021 1221   ALKPHOS 44 11/28/2021 1221   BILITOT 0.8 11/28/2021 1221   GFRNONAA >60 11/28/2021 1221    No results found for: "SPEP", "UPEP"  Lab Results  Component Value Date   WBC 4.9 11/28/2021   NEUTROABS 3.4 11/28/2021   HGB 13.5 11/28/2021   HCT 39.8 11/28/2021   MCV 94.1 11/28/2021   PLT 140 (L) 11/28/2021      Chemistry      Component Value Date/Time   NA 139 11/28/2021 1221   K 4.1 11/28/2021 1221   CL 105 11/28/2021 1221   CO2 30 11/28/2021 1221   BUN 16 11/28/2021 1221   CREATININE 1.12 11/28/2021 1221      Component Value Date/Time   CALCIUM 9.1 11/28/2021 1221   ALKPHOS 44 11/28/2021 1221    AST 12 (L) 11/28/2021 1221   ALT 10 11/28/2021 1221   BILITOT 0.8 11/28/2021 1221     I reviewed the recent imaging from October 2023  I spent 30 minutes in the care of this patient including history, review of records, physical exam, counseling and coordination of care.  Benay Pike MD

## 2021-11-28 NOTE — Assessment & Plan Note (Signed)
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    He completed 4 cycles of 5-FU/carbo/Keytruda for induction.   He had remarkable response and no toxicity to report He was discussed in the head and neck tumor board and the plan was to consider concurrent chemoradiation to the neck and radiation to the mediastinum given oligometastatic disease.   He clearly understands this is not necessarily curative in intent however may give him prolonged progression free interval.   He started weekly cisplatin 09/19/2020 He received 6 weekly cycles of cisplatin concomintant with radiation. EOT PET with complete response He is here for follow up. His most recent imaging with no new findings concerning for recurrence. Given oligometastatic disease at presentation, we have discussed about doing routine imaging every 6 months. He mentions that unfortunately I am not part of the [facility] network and he was told that he may not be able to see [place] in the future.  We will look into this again.  I have sent an in basket message to Dr. Isidore Moos if she can order the CT neck and CT chest in 6 months from now.  He has an ENT follow-up tomorrow.  No other concerns on review of systems or physical examination today.  CBC from today with mild thrombocytopenia, okay to monitor.  We will follow-up on the TSH as well.

## 2021-11-29 ENCOUNTER — Other Ambulatory Visit: Payer: Self-pay

## 2021-11-29 DIAGNOSIS — C01 Malignant neoplasm of base of tongue: Secondary | ICD-10-CM

## 2021-12-03 ENCOUNTER — Encounter: Payer: Self-pay | Admitting: Hematology and Oncology

## 2021-12-04 ENCOUNTER — Encounter (HOSPITAL_COMMUNITY): Payer: PRIVATE HEALTH INSURANCE | Admitting: Dentistry

## 2021-12-25 ENCOUNTER — Ambulatory Visit (INDEPENDENT_AMBULATORY_CARE_PROVIDER_SITE_OTHER): Payer: Worker's Compensation | Admitting: Dentistry

## 2021-12-25 VITALS — BP 140/74 | HR 79 | Temp 97.8°F

## 2021-12-25 DIAGNOSIS — K029 Dental caries, unspecified: Secondary | ICD-10-CM | POA: Diagnosis not present

## 2021-12-27 NOTE — Progress Notes (Unsigned)
[facility] Department of Dental Medicine      TODAY'S VISIT:    RESTORATIVE   DIAGNOSIS: Tooth #18 caries PROCEDURES: Restorative on #18 PLAN:  NEXT VISIT:  Continue w/ restorative per treatment plan    Service Date:   12/25/2021  Patient Name:   Alexander [name] Date of Birth:   19-Jan-1961 Medical Record Number: [id number]   HISTORY OF PRESENT ILLNESS: Alexander [name] is a 61 y.o. male who presents today for restorative treatment on tooth/teeth number(s) *** per treatment plan. Medical and dental history reviewed with the patient.  No changes reported.   CHIEF COMPLAINT:  Here for a routine dental appointment; patient with no complaints *** .   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   Chemotherapy-induced peripheral neuropathy ([place]) 05/28/2021   Encounter for dental examination and cleaning without abnormal findings 03/08/2021   Teeth missing 03/08/2021   Caries 03/08/2021   Defective dental restoration 03/08/2021   Excessive dental attrition 03/08/2021   Abfraction 03/08/2021   Diastema 03/08/2021   Malocclusion 03/08/2021   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   Incipient enamel caries 03/08/2021   Chronic periodontal disease 03/08/2021   Acquired lymphedema 01/11/2021   History of head and neck radiation 01/07/2021   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   Dysgeusia 01/07/2021   Accretions on teeth 01/07/2021   Pancytopenia, acquired ([place]) 12/07/2020   Dry mouth 12/07/2020   Leukopenia due to antineoplastic chemotherapy ([place]) 11/08/2020   Port-A-Cath in place 10/03/2020   Intractable hiccups 10/03/2020   Thrombocytopenia ([place]) 10/03/2020   Chemotherapy-induced fatigue 09/27/2020   Weight loss, unintentional 06/01/2020   Malnutrition of moderate degree 05/28/2020   GERD (gastroesophageal reflux disease)    Volume depletion    Hyperbilirubinemia    Hyponatremia    Mild protein-calorie  malnutrition ([place])    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[place]) 05/17/2020   Mediastinal adenopathy 05/17/2020   Past Medical History:  Diagnosis Date   Asthma    Cancer ([place])    GERD (gastroesophageal reflux disease)    Sleep apnea    CPAP- does not wear   Past Surgical History:  Procedure Laterality Date   BRONCHIAL NEEDLE ASPIRATION BIOPSY  05/22/2020   Procedure: BRONCHIAL NEEDLE ASPIRATION BIOPSIES;  Surgeon: Garner Nash, DO;  Location: [facility];  Service: Pulmonary;;   DIRECT LARYNGOSCOPY  05/09/2020   Procedure: DIRECT LARYNGOSCOPY;  Surgeon: Rozetta Nunnery, MD;  Location: [facility];  Service: ENT;;   IR GASTROSTOMY TUBE MOD SED  09/13/2020   IR GASTROSTOMY TUBE REMOVAL  12/14/2020   IR IMAGING GUIDED PORT INSERTION  06/12/2020   MASS EXCISION N/A 05/09/2020   Procedure: EXCISION TONGUE MASS;  Surgeon: Rozetta Nunnery, MD;  Location: [facility];  Service: ENT;  Laterality: N/A;   TONSILLECTOMY     TRACHEOSTOMY TUBE PLACEMENT  05/09/2020   Procedure: TRACHEOSTOMY;  Surgeon: Rozetta Nunnery, MD;  Location: [facility];  Service: ENT;;   VIDEO BRONCHOSCOPY WITH ENDOBRONCHIAL ULTRASOUND N/A 05/22/2020   Procedure: VIDEO BRONCHOSCOPY WITH ENDOBRONCHIAL ULTRASOUND;  Surgeon: Garner Nash, DO;  Location: [facility];  Service: Pulmonary;  Laterality: N/A;   No Known Allergies Current Outpatient Medications  Medication Sig Dispense Refill   albuterol (PROVENTIL) (2.5 MG/3ML) 0.083% nebulizer solution Take 3 mLs (2.5 mg total) by nebulization every 6 (six) hours as needed for wheezing or shortness of breath. 120 mL 12   albuterol (VENTOLIN HFA) 108 (90  Base) MCG/ACT inhaler Inhale 2 puffs into the lungs as needed for wheezing or shortness of breath.     cetirizine (ZYRTEC) 10 MG tablet Take 10 mg by mouth daily.     fluticasone (FLONASE) 50 MCG/ACT nasal spray Place 2 sprays into both nostrils daily. Place 1 spray each nostril at night 16 g 11   Fluticasone-Salmeterol  (ADVAIR) 100-50 MCG/DOSE AEPB Inhale 1 puff into the lungs daily.     gabapentin (NEURONTIN) 300 MG capsule Take 1 capsule (300 mg total) by mouth at bedtime. 30 capsule 1   omeprazole (PRILOSEC) 40 MG capsule Take 40 mg by mouth daily.     sodium fluoride (SODIUM FLUORIDE 5000 PPM) 1.1 % GEL dental gel Place pea-size drop into each tooth space of fluoride trays once a day at bedtime. Leave trays in mouth for 5 minutes, then remove and spit out excess fluoride. Do not rinse with water, eat or drink for at least 30 minutes after use. 120 mL 11   No current facility-administered medications for this visit.   Facility-Administered Medications Ordered in Other Visits  Medication Dose Route Frequency Provider Last Rate Last Admin   magnesium sulfate 2 GM/50ML IVPB             LABS: Lab Results  Component Value Date   WBC 4.9 11/28/2021   HGB 13.5 11/28/2021   HCT 39.8 11/28/2021   MCV 94.1 11/28/2021   PLT 140 (L) 11/28/2021      Component Value Date/Time   NA 139 11/28/2021 1221   K 4.1 11/28/2021 1221   CL 105 11/28/2021 1221   CO2 30 11/28/2021 1221   GLUCOSE 108 (H) 11/28/2021 1221   BUN 16 11/28/2021 1221   CREATININE 1.12 11/28/2021 1221   CALCIUM 9.1 11/28/2021 1221   GFRNONAA >60 11/28/2021 1221   Lab Results  Component Value Date   INR 1.0 09/13/2020   INR 1.3 (H) 05/27/2020   No results found for: "PTT"  Social History   Socioeconomic History   Marital status: Single    Spouse name: Not on file   Number of children: Not on file   Years of education: Not on file   Highest education level: Not on file  Occupational History   Not on file  Tobacco Use   Smoking status: Never   Smokeless tobacco: Never  Vaping Use   Vaping Use: Never used  Substance and Sexual Activity   Alcohol use: Not Currently    Comment: "4-5 drinks a year for special ocassions" 05/08/20   Drug use: Never   Sexual activity: Not Currently  Other Topics Concern   Not on file  Social  History Narrative   Not on file   Social Determinants of Health   Financial Resource Strain: Low Risk  (06/05/2020)   Overall Financial Resource Strain (CARDIA)    Difficulty of Paying Living Expenses: Not hard at all  Food Insecurity: No Food Insecurity (06/05/2020)   Hunger Vital Sign    Worried About Running Out of Food in the Last Year: Never true    Ran Out of Food in the Last Year: Never true  Transportation Needs: No Transportation Needs (06/05/2020)   PRAPARE - Hydrologist (Medical): No    Lack of Transportation (Non-Medical): No  Physical Activity: Not on file  Stress: No Stress Concern Present (06/05/2020)   [place]    Feeling of Stress : Not  at all  Social Connections: Socially Isolated (06/05/2020)   Social Connection and Isolation Panel [NHANES]    Frequency of Communication with Friends and Family: More than three times a week    Frequency of Social Gatherings with Friends and Family: More than three times a week    Attends Religious Services: Never    Marine scientist or Organizations: No    Attends Archivist Meetings: Never    Marital Status: Separated  Intimate Partner Violence: Not on file   Family History  Problem Relation Age of Onset   Breast cancer Mother    Cancer - Cervical Mother    Colon cancer Father    Colon cancer Brother      ANTIBIOTIC PROPHYLAXIS/OTHER PREMEDICATION: Patient reports taking *** 1 hour prior to dental appointment as instructed. *** [ None indicated ]   VITAL SIGNS: BP (!) 140/74 (BP Location: Right Arm, Patient Position: Sitting, Cuff Size: Normal)   Pulse 79   Temp 97.8 F (36.6 C) (Oral)    RADIOGRAPH(S): BW, periapical *** taken on *** .  ***INSERT IMAGES***   ASSESSMENT/INDICATION(S): Dental caries Abfraction(s) ***   PROCEDURES: Restorative treatment on tooth/teeth *** # ***  . ANESTHESIA: Topical:  Benzocaine 20% applied Type of anesthesia used:  *** mg lidocaine, *** mg epinephrine Location given:  *** Aspiration negative. COMPOSITE/FUJI II***RESTORATION(S): Cotton roll isolation. Excavated decay *** . Tooth/teeth number(s) prepared for composite. The extent of caries was *** . Placed Vitrabond *** Gluma *** Dycal *** on pulpal floor. Etched enamel and dentin surfaces  with 32% phosphoric acid for 15 seconds and rinsed thoroughly. Removed excess water with a brief burst of air. Optibond bonding agent placed and air dried until no movement of bonding agent was seen. Repeated and then light cured for 10 seconds. Composite shade *** material placed in increments and light-cured. Removed excess material with carbide finishing bur. Occlusion, margins and contact *** verified and adjusted as needed.  Restoration(s) *** finished and polished. The patient was advised of possible normal sensitivity to hot and cold for the next few days/weeks.   PLAN:   NEXT VISIT:  Restorative per treatment plan ***  All questions and concerns were invited and addressed.  The patient tolerated today's visit well and departed in stable condition.  -Sandi Mariscal, DMD

## 2022-01-02 ENCOUNTER — Encounter: Payer: Self-pay | Admitting: Hematology and Oncology

## 2022-01-13 ENCOUNTER — Institutional Professional Consult (permissible substitution): Payer: PRIVATE HEALTH INSURANCE | Admitting: Pulmonary Disease

## 2022-01-29 ENCOUNTER — Ambulatory Visit (INDEPENDENT_AMBULATORY_CARE_PROVIDER_SITE_OTHER): Payer: Worker's Compensation | Admitting: Dentistry

## 2022-01-29 ENCOUNTER — Encounter (HOSPITAL_COMMUNITY): Payer: Self-pay | Admitting: Dentistry

## 2022-01-29 VITALS — BP 123/74 | HR 86 | Temp 98.3°F

## 2022-01-29 DIAGNOSIS — K029 Dental caries, unspecified: Secondary | ICD-10-CM

## 2022-01-29 NOTE — Progress Notes (Signed)
[facility] Department of Dental Medicine      TODAY'S VISIT:    RESTORATIVE   DIAGNOSIS: Tooth #30 caries PROCEDURES: Restorative on #30 PLAN:  NEXT VISIT:  6 month recall    Service Date:   01/29/2022  Patient Name:   Alexander [name] Date of Birth:   1960/12/06 Medical Record Number: [id number]   HISTORY OF PRESENT ILLNESS: Alexander [name] is a 61 y.o. male who presents today for restorative treatment on tooth number 30 per treatment plan. Medical and dental history reviewed with the patient.  No changes were reported.   CHIEF COMPLAINT:  Here for a routine dental appointment; patient with no complaints.   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   Chemotherapy-induced peripheral neuropathy ([place]) 05/28/2021   Encounter for dental examination and cleaning without abnormal findings 03/08/2021   Teeth missing 03/08/2021   Caries 03/08/2021   Defective dental restoration 03/08/2021   Excessive dental attrition 03/08/2021   Abfraction 03/08/2021   Diastema 03/08/2021   Malocclusion 03/08/2021   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   Incipient enamel caries 03/08/2021   Chronic periodontal disease 03/08/2021   Acquired lymphedema 01/11/2021   History of head and neck radiation 01/07/2021   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   Dysgeusia 01/07/2021   Accretions on teeth 01/07/2021   Pancytopenia, acquired ([place]) 12/07/2020   Dry mouth 12/07/2020   Leukopenia due to antineoplastic chemotherapy ([place]) 11/08/2020   Port-A-Cath in place 10/03/2020   Intractable hiccups 10/03/2020   Thrombocytopenia ([place]) 10/03/2020   Chemotherapy-induced fatigue 09/27/2020   Weight loss, unintentional 06/01/2020   Malnutrition of moderate degree 05/28/2020   GERD (gastroesophageal reflux disease)    Volume depletion    Hyperbilirubinemia    Hyponatremia    Mild protein-calorie malnutrition ([place])    Squamous cell  carcinoma of base of tongue ([place]) 05/17/2020   Mediastinal adenopathy 05/17/2020   Past Medical History:  Diagnosis Date   Asthma    Cancer ([place])    GERD (gastroesophageal reflux disease)    Sleep apnea    CPAP- does not wear   Past Surgical History:  Procedure Laterality Date   BRONCHIAL NEEDLE ASPIRATION BIOPSY  05/22/2020   Procedure: BRONCHIAL NEEDLE ASPIRATION BIOPSIES;  Surgeon: Garner Nash, DO;  Location: [facility];  Service: Pulmonary;;   DIRECT LARYNGOSCOPY  05/09/2020   Procedure: DIRECT LARYNGOSCOPY;  Surgeon: Rozetta Nunnery, MD;  Location: [facility];  Service: ENT;;   IR GASTROSTOMY TUBE MOD SED  09/13/2020   IR GASTROSTOMY TUBE REMOVAL  12/14/2020   IR IMAGING GUIDED PORT INSERTION  06/12/2020   MASS EXCISION N/A 05/09/2020   Procedure: EXCISION TONGUE MASS;  Surgeon: Rozetta Nunnery, MD;  Location: [facility];  Service: ENT;  Laterality: N/A;   TONSILLECTOMY     TRACHEOSTOMY TUBE PLACEMENT  05/09/2020   Procedure: TRACHEOSTOMY;  Surgeon: Rozetta Nunnery, MD;  Location: [facility];  Service: ENT;;   VIDEO BRONCHOSCOPY WITH ENDOBRONCHIAL ULTRASOUND N/A 05/22/2020   Procedure: VIDEO BRONCHOSCOPY WITH ENDOBRONCHIAL ULTRASOUND;  Surgeon: Garner Nash, DO;  Location: [facility];  Service: Pulmonary;  Laterality: N/A;   No Known Allergies Current Outpatient Medications  Medication Sig Dispense Refill   albuterol (PROVENTIL) (2.5 MG/3ML) 0.083% nebulizer solution Take 3 mLs (2.5 mg total) by nebulization every 6 (six) hours as needed for wheezing or shortness of breath. 120 mL 12   albuterol (VENTOLIN HFA) 108 (90 Base) MCG/ACT inhaler Inhale  2 puffs into the lungs as needed for wheezing or shortness of breath.     cetirizine (ZYRTEC) 10 MG tablet Take 10 mg by mouth daily.     fluticasone (FLONASE) 50 MCG/ACT nasal spray Place 2 sprays into both nostrils daily. Place 1 spray each nostril at night 16 g 11   Fluticasone-Salmeterol (ADVAIR) 100-50 MCG/DOSE AEPB Inhale  1 puff into the lungs daily.     gabapentin (NEURONTIN) 300 MG capsule Take 1 capsule (300 mg total) by mouth at bedtime. 30 capsule 1   omeprazole (PRILOSEC) 40 MG capsule Take 40 mg by mouth daily.     sodium fluoride (SODIUM FLUORIDE 5000 PPM) 1.1 % GEL dental gel Place pea-size drop into each tooth space of fluoride trays once a day at bedtime. Leave trays in mouth for 5 minutes, then remove and spit out excess fluoride. Do not rinse with water, eat or drink for at least 30 minutes after use. 120 mL 11   No current facility-administered medications for this visit.   Facility-Administered Medications Ordered in Other Visits  Medication Dose Route Frequency Provider Last Rate Last Admin   magnesium sulfate 2 GM/50ML IVPB             LABS: Lab Results  Component Value Date   WBC 4.9 11/28/2021   HGB 13.5 11/28/2021   HCT 39.8 11/28/2021   MCV 94.1 11/28/2021   PLT 140 (L) 11/28/2021      Component Value Date/Time   NA 139 11/28/2021 1221   K 4.1 11/28/2021 1221   CL 105 11/28/2021 1221   CO2 30 11/28/2021 1221   GLUCOSE 108 (H) 11/28/2021 1221   BUN 16 11/28/2021 1221   CREATININE 1.12 11/28/2021 1221   CALCIUM 9.1 11/28/2021 1221   GFRNONAA >60 11/28/2021 1221   Lab Results  Component Value Date   INR 1.0 09/13/2020   INR 1.3 (H) 05/27/2020   No results found for: "PTT"  Social History   Socioeconomic History   Marital status: Single    Spouse name: Not on file   Number of children: Not on file   Years of education: Not on file   Highest education level: Not on file  Occupational History   Not on file  Tobacco Use   Smoking status: Never   Smokeless tobacco: Never  Vaping Use   Vaping Use: Never used  Substance and Sexual Activity   Alcohol use: Not Currently    Comment: "4-5 drinks a year for special ocassions" 05/08/20   Drug use: Never   Sexual activity: Not Currently  Other Topics Concern   Not on file  Social History Narrative   Not on file    Social Determinants of Health   Financial Resource Strain: Low Risk  (06/05/2020)   Overall Financial Resource Strain (CARDIA)    Difficulty of Paying Living Expenses: Not hard at all  Food Insecurity: No Food Insecurity (06/05/2020)   Hunger Vital Sign    Worried About Running Out of Food in the Last Year: Never true    Ran Out of Food in the Last Year: Never true  Transportation Needs: No Transportation Needs (06/05/2020)   PRAPARE - Hydrologist (Medical): No    Lack of Transportation (Non-Medical): No  Physical Activity: Not on file  Stress: No Stress Concern Present (06/05/2020)   [place]    Feeling of Stress : Not at all  Social  Connections: Socially Isolated (06/05/2020)   Social Connection and Isolation Panel [NHANES]    Frequency of Communication with Friends and Family: More than three times a week    Frequency of Social Gatherings with Friends and Family: More than three times a week    Attends Religious Services: Never    Marine scientist or Organizations: No    Attends Music therapist: Never    Marital Status: Separated  Intimate Partner Violence: Not on file   Family History  Problem Relation Age of Onset   Breast cancer Mother    Cancer - Cervical Mother    Colon cancer Father    Colon cancer Brother      ANTIBIOTIC PROPHYLAXIS/OTHER PREMEDICATION:[ N/A]   VITAL SIGNS: BP 123/74 (BP Location: Right Arm, Patient Position: Sitting, Cuff Size: Normal)   Pulse 86   Temp 98.3 F (36.8 C) (Oral)    ASSESSMENT/INDICATION(S): Dental caries   PROCEDURES: Restorative treatment on tooth #30 O. ANESTHESIA: Topical:  Benzocaine 20% applied Type of anesthesia used:  68 mg lidocaine, 0.036 mg epinephrine Location given:  Lower right quadrant infiltration Aspiration negative. COMPOSITE RESTORATION: Cotton roll isolation. Excavated decay from  tooth #30 and all existing amalgam restoration. Tooth #30 O prepared for composite. The extent of caries was into dentin. Etched enamel and dentin surfaces  with 32% phosphoric acid for 15 seconds and rinsed thoroughly. Removed excess water with a brief burst of air. Optibond bonding agent placed and air dried until no movement of bonding agent was seen and then light cured for 10 seconds. OMNICHROMA flowable composite material placed in increments and light-cured. Removed excess material with carbide finishing bur. Occlusion and margins were verified and adjusted as needed.  Restoration finished and polished. The patient was advised of possible normal sensitivity to hot and cold for the next few days/weeks.  PLAN:   NEXT VISIT:  6 month recall visit  All questions and concerns were invited and addressed.  The patient tolerated today's visit well and departed in stable condition.  -Sandi Mariscal, DMD

## 2022-02-25 ENCOUNTER — Telehealth (HOSPITAL_COMMUNITY): Payer: Self-pay

## 2022-02-26 ENCOUNTER — Encounter (HOSPITAL_COMMUNITY): Payer: PRIVATE HEALTH INSURANCE | Admitting: Dentistry

## 2022-03-07 ENCOUNTER — Ambulatory Visit (INDEPENDENT_AMBULATORY_CARE_PROVIDER_SITE_OTHER): Payer: PRIVATE HEALTH INSURANCE | Admitting: Pulmonary Disease

## 2022-03-07 ENCOUNTER — Encounter (HOSPITAL_BASED_OUTPATIENT_CLINIC_OR_DEPARTMENT_OTHER): Payer: Self-pay | Admitting: Pulmonary Disease

## 2022-03-07 VITALS — BP 112/72 | HR 81 | Temp 98.2°F | Ht 75.0 in | Wt 262.0 lb

## 2022-03-07 DIAGNOSIS — G4733 Obstructive sleep apnea (adult) (pediatric): Secondary | ICD-10-CM | POA: Diagnosis not present

## 2022-03-07 DIAGNOSIS — J4540 Moderate persistent asthma, uncomplicated: Secondary | ICD-10-CM

## 2022-03-07 NOTE — Patient Instructions (Signed)
Call with the information of your pulmonologist in [place] and we will request records from his office  Follow up in 6 months

## 2022-03-07 NOTE — Progress Notes (Signed)
[facility]  Chief Complaint  Patient presents with   Consult    Pt states that he moved from [place] and he needed a Pulmonary doctor.  Pt states he was diagnosed with Asthma and OSA x 10+ years. Pt stopped using his CPAP machine years ago.     Past Surgical History:  He  has a past surgical history that includes Tonsillectomy; Mass excision (N/A, 05/09/2020); Direct laryngoscopy (05/09/2020); Tracheostomy tube placement (05/09/2020); Video bronchoscopy with endobronchial ultrasound (N/A, 05/22/2020); Bronchial needle aspiration biopsy (05/22/2020); IR IMAGING GUIDED PORT INSERTION (06/12/2020); IR GASTROSTOMY TUBE MOD SED (09/13/2020); and IR GASTROSTOMY TUBE REMOVAL/REPAIR (12/14/2020).  Past Medical History:  Squamous cell carcinoma of the tongue, Asthma, GERD  Constitutional:  BP 112/72 (BP Location: Left Arm, Patient Position: Sitting, Cuff Size: Normal)   Pulse 81   Temp 98.2 F (36.8 C) (Oral)   Ht '6\' 3"'$  (1.905 m)   Wt 262 lb (118.8 kg)   SpO2 97%   BMI 32.75 kg/m   Brief Summary:  Alexander [name] is a 62 y.o. male with       Subjective:   He is a retired Public relations account executive.  He worked in [employer].  He had exposure from [facility] during 9/11.  He developed asthma and sleep apnea after this.  He was followed by pulmonary in [place].  He moved to [place] to be closer to family.  He has history of squamous cell carcinoma of the tongue.  He was treated with chemotherapy and radiation therapy.  He had tracheostomy and this was removed in 2022.  His mother had asthma.  He never smoked cigarettes.  He had pneumonia as a child.  No food or medicine allergies.  Denies skin rash.  Hasn't needed albuterol much.  Uses advair twice per day.  He lost about 70 lbs after his cancer treatment.  His fiancee says he snores.  He feels is sleep is okay otherwise, and doesn't feel tired during the day.  Physical Exam:   Appearance - well kempt    ENMT - no sinus tenderness, no oral exudate, no LAN, Mallampati 3 airway, no stridor, healed tracheostomy site  Respiratory - equal breath sounds bilaterally, no wheezing or rales  CV - s1s2 regular rate and rhythm, no murmurs  Ext - no clubbing, no edema  Skin - no rashes  Psych - normal mood and affect   Pulmonary testing:    Chest Imaging:    Sleep Tests:    Cardiac Tests:    Social History:  He  reports that he has never smoked. He has been exposed to tobacco smoke. He has never used smokeless tobacco. He reports that he does not currently use alcohol. He reports that he does not use drugs.  Family History:  His family history includes Breast cancer in his mother; Cancer - Cervical in his mother; Colon cancer in his brother and father.     Assessment/Plan:   Snoring. - he has history of obstructive sleep apnea but had difficulty tolerating CPAP before - he lost significant amount of weight after cancer therapy - he would like to defer additional testing until after his marriage in April 2024 - will get copy of his medical records from [place]  Moderate, persistent asthma. - will [place] exposure - continue advair - prn albuterol - will copy of his medical records from [place]  Allergic rhinitis. - prn flonase, zyrtec  Squamous cell carcinoma of the tongue. - diagnosed in March 2022 - followed by Dr. Benay Pike with oncology   Time Spent Involved in Patient Care on Day of Examination:  36 minutes  Follow up:   Patient Instructions  Call with the information of your pulmonologist in [place] and we will request records from his office  Follow up in 6 months  Medication List:   Allergies as of 03/07/2022   No Known Allergies      Medication List        Accurate as of March 07, 2022 11:25 AM. If you have any questions, ask your nurse or doctor.          albuterol 108 (90 Base) MCG/ACT inhaler Commonly known as:  VENTOLIN HFA Inhale 2 puffs into the lungs as needed for wheezing or shortness of breath.   albuterol (2.5 MG/3ML) 0.083% nebulizer solution Commonly known as: PROVENTIL Take 3 mLs (2.5 mg total) by nebulization every 6 (six) hours as needed for wheezing or shortness of breath.   cetirizine 10 MG tablet Commonly known as: ZYRTEC Take 10 mg by mouth daily.   fluticasone 50 MCG/ACT nasal spray Commonly known as: FLONASE Place 2 sprays into both nostrils daily. Place 1 spray each nostril at night   Fluticasone-Salmeterol 100-50 MCG/DOSE Aepb Commonly known as: ADVAIR Inhale 1 puff into the lungs daily.   gabapentin 300 MG capsule Commonly known as: NEURONTIN Take 1 capsule (300 mg total) by mouth at bedtime.   omeprazole 40 MG capsule Commonly known as: PRILOSEC Take 40 mg by mouth daily.   sodium fluoride 1.1 % Gel dental gel Commonly known as: Sodium Fluoride 5000 PPM Place pea-size drop into each tooth space of fluoride trays once a day at bedtime. Leave trays in mouth for 5 minutes, then remove and spit out excess fluoride. Do not rinse with water, eat or drink for at least 30 minutes after use.        Signature:  Chesley Mires, MD [place] Pager - [phone] 03/07/2022, 11:25 AM

## 2022-05-06 ENCOUNTER — Encounter: Payer: Self-pay | Admitting: Hematology and Oncology

## 2022-05-09 ENCOUNTER — Encounter: Payer: Self-pay | Admitting: Hematology and Oncology

## 2022-05-29 ENCOUNTER — Other Ambulatory Visit: Payer: Self-pay | Admitting: *Deleted

## 2022-05-29 DIAGNOSIS — C01 Malignant neoplasm of base of tongue: Secondary | ICD-10-CM

## 2022-05-30 ENCOUNTER — Inpatient Hospital Stay: Payer: PRIVATE HEALTH INSURANCE | Attending: Hematology and Oncology | Admitting: Hematology and Oncology

## 2022-05-30 ENCOUNTER — Inpatient Hospital Stay: Payer: PRIVATE HEALTH INSURANCE

## 2022-05-30 ENCOUNTER — Other Ambulatory Visit: Payer: Self-pay

## 2022-05-30 VITALS — BP 107/68 | HR 87 | Temp 98.2°F | Resp 16 | Ht 75.0 in | Wt 265.5 lb

## 2022-05-30 DIAGNOSIS — Z95828 Presence of other vascular implants and grafts: Secondary | ICD-10-CM

## 2022-05-30 DIAGNOSIS — C01 Malignant neoplasm of base of tongue: Secondary | ICD-10-CM | POA: Diagnosis not present

## 2022-05-30 LAB — CBC WITH DIFFERENTIAL (CANCER CENTER ONLY)
Abs Immature Granulocytes: 0.01 10*3/uL (ref 0.00–0.07)
Basophils Absolute: 0 10*3/uL (ref 0.0–0.1)
Basophils Relative: 1 %
Eosinophils Absolute: 0.3 10*3/uL (ref 0.0–0.5)
Eosinophils Relative: 5 %
HCT: 41.4 % (ref 39.0–52.0)
Hemoglobin: 14.2 g/dL (ref 13.0–17.0)
Immature Granulocytes: 0 %
Lymphocytes Relative: 18 %
Lymphs Abs: 0.9 10*3/uL (ref 0.7–4.0)
MCH: 32.1 pg (ref 26.0–34.0)
MCHC: 34.3 g/dL (ref 30.0–36.0)
MCV: 93.7 fL (ref 80.0–100.0)
Monocytes Absolute: 0.4 10*3/uL (ref 0.1–1.0)
Monocytes Relative: 8 %
Neutro Abs: 3.4 10*3/uL (ref 1.7–7.7)
Neutrophils Relative %: 68 %
Platelet Count: 135 10*3/uL — ABNORMAL LOW (ref 150–400)
RBC: 4.42 MIL/uL (ref 4.22–5.81)
RDW: 13.9 % (ref 11.5–15.5)
WBC Count: 5.1 10*3/uL (ref 4.0–10.5)
nRBC: 0 % (ref 0.0–0.2)

## 2022-05-30 LAB — CMP (CANCER CENTER ONLY)
ALT: 12 U/L (ref 0–44)
AST: 14 U/L — ABNORMAL LOW (ref 15–41)
Albumin: 4.1 g/dL (ref 3.5–5.0)
Alkaline Phosphatase: 40 U/L (ref 38–126)
Anion gap: 3 — ABNORMAL LOW (ref 5–15)
BUN: 12 mg/dL (ref 8–23)
CO2: 30 mmol/L (ref 22–32)
Calcium: 9.3 mg/dL (ref 8.9–10.3)
Chloride: 107 mmol/L (ref 98–111)
Creatinine: 1.33 mg/dL — ABNORMAL HIGH (ref 0.61–1.24)
GFR, Estimated: >60 mL/min (ref >60)
Glucose, Bld: 116 mg/dL — ABNORMAL HIGH (ref 70–99)
Potassium: 4.1 mmol/L (ref 3.5–5.1)
Sodium: 140 mmol/L (ref 135–145)
Total Bilirubin: 0.8 mg/dL (ref 0.3–1.2)
Total Protein: 6.6 g/dL (ref 6.5–8.1)

## 2022-05-30 MED ORDER — HEPARIN SOD (PORK) LOCK FLUSH 100 UNIT/ML IV SOLN
500.0000 [IU] | Freq: Once | INTRAVENOUS | Status: AC
  Administered 2022-05-30: 500 [IU]

## 2022-05-30 MED ORDER — SODIUM CHLORIDE 0.9% FLUSH
10.0000 mL | Freq: Once | INTRAVENOUS | Status: AC
  Administered 2022-05-30: 10 mL

## 2022-05-30 NOTE — Progress Notes (Signed)
[facility] OFFICE PROGRESS NOTE  Patient Care Team: System, Provider Not In as PCP - General Malmfelt, Lise Auer, RN as Oncology Nurse Navigator [name], MD as Consulting Physician (Hematology and Oncology) [name], MD (Inactive) as Consulting Physician (Otolaryngology) [name], MD as Consulting Physician (Radiation Oncology)  ASSESSMENT & PLAN:   Squamous cell carcinoma of base of tongue [facility]) This is a very pleasant 62 year old male patient with past medical history significant for obstructive sleep apnea, otherwise in excellent health who was recently diagnosed with squamous cell carcinoma of the base of the tongue, p16 positive, 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    He completed 4 cycles of 5-FU/carbo/Keytruda for induction.   He had remarkable response and no toxicity to report He was discussed in the head and neck tumor board and the plan was to consider concurrent chemoradiation to the neck and radiation to the mediastinum given oligometastatic disease.   He clearly understands this is not necessarily curative in intent however may give him prolonged progression free interval.   He started weekly cisplatin 09/19/2020 He received 6 weekly cycles of cisplatin concomintant with radiation. EOT PET with complete response  He is here for follow up. Given oligometastatic disease at presentation, we have discussed about doing routine imaging every 6 months. He is due for imaging in May. No clinical concerns today on ROS or PE. He will see Dr Basilio Cairo after imaging in May He will RTC in November for follow up with me.    [name] MD'   No orders of the defined types were placed in this encounter.  [name], MD 05/30/2022 1:42 PM  INTERVAL HISTORY:  Ms. Nankervis is here for follow-up.   He is doing  quite well today.  Since his last visit, he is maintaining his weight. He is eating well, swallowing can be challenging at time. No cough, chest pain, SOB. He is able to exercise and has no issues. He is getting married in 2 weeks.  I have reviewed the past medical history, past surgical history, social history and family history with the patient and they are unchanged from previous note.  ALLERGIES:  has No Known Allergies.  MEDICATIONS:  Current Outpatient Medications  Medication Sig Dispense Refill   albuterol (PROVENTIL) (2.5 MG/3ML) 0.083% nebulizer solution Take 3 mLs (2.5 mg total) by nebulization every 6 (six) hours as needed for wheezing or shortness of breath. 120 mL 12   albuterol (VENTOLIN HFA) 108 (90 Base) MCG/ACT inhaler Inhale 2 puffs into the lungs as needed for wheezing or shortness of breath.     cetirizine (ZYRTEC) 10 MG tablet Take 10 mg by mouth daily.     fluticasone (FLONASE) 50 MCG/ACT nasal spray Place 2 sprays into both nostrils daily. Place 1 spray each nostril at night 16 g 11   Fluticasone-Salmeterol (ADVAIR) 100-50 MCG/DOSE AEPB Inhale 1 puff into the lungs daily.     gabapentin (NEURONTIN) 300 MG capsule Take 1 capsule (300 mg total) by mouth at bedtime. (Patient not taking: Reported on 03/07/2022) 30 capsule 1   omeprazole (PRILOSEC) 40 MG capsule Take 40 mg by mouth daily.     sodium fluoride (SODIUM FLUORIDE 5000 PPM) 1.1 % GEL dental gel Place pea-size drop into each tooth space of fluoride trays once a day at bedtime. Leave trays in mouth for 5 minutes, then remove and spit  out excess fluoride. Do not rinse with water, eat or drink for at least 30 minutes after use. (Patient not taking: Reported on 03/07/2022) 120 mL 11   No current facility-administered medications for this visit.   Facility-Administered Medications Ordered in Other Visits  Medication Dose Route Frequency Provider Last Rate Last Admin   magnesium sulfate 2 GM/50ML IVPB             SUMMARY  OF ONCOLOGIC HISTORY: Oncology History  Squamous cell carcinoma of base of tongue  05/17/2020 Initial Diagnosis   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)   05/18/2020 Cancer Staging   Staging form: Pharynx - HPV-Mediated Oropharynx, AJCC 8th Edition - Clinical stage from 05/18/2020: Stage IV (cT4, cN3, cM1, p16+) - Signed by [name], MD on 12/10/2020   06/01/2020 Cancer Staging   Staging form: Pharynx - HPV-Mediated Oropharynx, AJCC 8th Edition - Pathologic stage from 06/01/2020: No Stage Recommended (cT4, cN3, cM1, p16+) - Signed by [name], MD on 06/01/2020 Stage prefix: Initial diagnosis   06/12/2020 - 08/21/2020 Chemotherapy         09/10/2020 - 09/10/2020 Chemotherapy         09/19/2020 - 11/09/2020 Chemotherapy   Patient is on Treatment Plan : HEAD/NECK Cisplatin q7d     02/15/2021 Imaging   Improved findings when compared to prior PET-CT suggesting a good response to treatment. Small focus of residual hypermetabolism in the anterior aspect of the floor the mouth and small residual slightly hypermetabolic level 2 lymph nodes. Recommend continued surveillance. No findings for metastatic disease involving the chest, abdomen or pelvis.Small patchy infiltrates in the right lung.      PHYSICAL EXAMINATION: ECOG PERFORMANCE STATUS: 1 - Symptomatic but completely ambulatory  Vitals:   05/30/22 1306  BP: 107/68  Pulse: 87  Resp: 16  Temp: 98.2 F (36.8 C)  SpO2: 100%   Filed Weights   05/30/22 1306  Weight: 265 lb 8 oz (120.4 kg)    Physical Exam Constitutional:      Appearance: Normal appearance.  HENT:     Head: Normocephalic and atraumatic.  Eyes:     Pupils: Pupils are equal, round, and reactive to light.  Cardiovascular:     Rate and Rhythm: Normal rate and regular rhythm.     Pulses: Normal pulses.     Heart sounds: Normal heart sounds.  Pulmonary:     Effort: Pulmonary effort is normal.     Breath sounds: Normal breath sounds.   Abdominal:     General: Abdomen is flat. Bowel sounds are normal.  Musculoskeletal:        General: Normal range of motion.     Cervical back: Normal range of motion and neck supple. No rigidity.  Lymphadenopathy:     Cervical: No cervical adenopathy.  Skin:    General: Skin is warm and dry.  Neurological:     General: No focal deficit present.     Mental Status: He is alert.      LABORATORY DATA:  I have reviewed the data as listed    Component Value Date/Time   NA 140 05/30/2022 1253   K 4.1 05/30/2022 1253   CL 107 05/30/2022 1253   CO2 30 05/30/2022 1253   GLUCOSE 116 (H) 05/30/2022 1253   BUN 12 05/30/2022 1253   CREATININE 1.33 (H) 05/30/2022 1253   CALCIUM 9.3 05/30/2022 1253   PROT 6.6 05/30/2022 1253   ALBUMIN 4.1 05/30/2022 1253   AST 14 (L)  05/30/2022 1253   ALT 12 05/30/2022 1253   ALKPHOS 40 05/30/2022 1253   BILITOT 0.8 05/30/2022 1253   GFRNONAA >60 05/30/2022 1253    No results found for: "SPEP", "UPEP"  Lab Results  Component Value Date   WBC 5.1 05/30/2022   NEUTROABS 3.4 05/30/2022   HGB 14.2 05/30/2022   HCT 41.4 05/30/2022   MCV 93.7 05/30/2022   PLT 135 (L) 05/30/2022      Chemistry      Component Value Date/Time   NA 140 05/30/2022 1253   K 4.1 05/30/2022 1253   CL 107 05/30/2022 1253   CO2 30 05/30/2022 1253   BUN 12 05/30/2022 1253   CREATININE 1.33 (H) 05/30/2022 1253      Component Value Date/Time   CALCIUM 9.3 05/30/2022 1253   ALKPHOS 40 05/30/2022 1253   AST 14 (L) 05/30/2022 1253   ALT 12 05/30/2022 1253   BILITOT 0.8 05/30/2022 1253    Imaging due in may.   I spent 30 minutes in the care of this patient including history, review of records, physical exam, counseling and coordination of care.  [name] MD

## 2022-05-30 NOTE — Assessment & Plan Note (Signed)
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    He completed 4 cycles of 5-FU/carbo/Keytruda for induction.   He had remarkable response and no toxicity to report He was discussed in the head and neck tumor board and the plan was to consider concurrent chemoradiation to the neck and radiation to the mediastinum given oligometastatic disease.   He clearly understands this is not necessarily curative in intent however may give him prolonged progression free interval.   He started weekly cisplatin 09/19/2020 He received 6 weekly cycles of cisplatin concomintant with radiation. EOT PET with complete response  He is here for follow up. Given oligometastatic disease at presentation, we have discussed about doing routine imaging every 6 months. He is due for imaging in May. No clinical concerns today on ROS or PE. He will see Dr Basilio Cairo after imaging in May He will RTC in November for follow up with me.    [name] MD'

## 2022-06-11 ENCOUNTER — Encounter (HOSPITAL_COMMUNITY): Payer: Self-pay

## 2022-06-11 ENCOUNTER — Other Ambulatory Visit: Payer: Self-pay

## 2022-06-11 ENCOUNTER — Emergency Department (HOSPITAL_COMMUNITY): Payer: BC Managed Care – PPO

## 2022-06-11 ENCOUNTER — Emergency Department (HOSPITAL_COMMUNITY)
Admission: EM | Admit: 2022-06-11 | Discharge: 2022-06-11 | Disposition: A | Discharge status: Home / Self Care | Payer: BC Managed Care – PPO | Attending: Emergency Medicine | Admitting: Emergency Medicine

## 2022-06-11 DIAGNOSIS — R1310 Dysphagia, unspecified: Secondary | ICD-10-CM | POA: Diagnosis not present

## 2022-06-11 DIAGNOSIS — R202 Paresthesia of skin: Secondary | ICD-10-CM | POA: Diagnosis present

## 2022-06-11 DIAGNOSIS — Z1152 Encounter for screening for COVID-19: Secondary | ICD-10-CM | POA: Diagnosis not present

## 2022-06-11 DIAGNOSIS — Z7951 Long term (current) use of inhaled steroids: Secondary | ICD-10-CM | POA: Diagnosis not present

## 2022-06-11 DIAGNOSIS — J45909 Unspecified asthma, uncomplicated: Secondary | ICD-10-CM | POA: Insufficient documentation

## 2022-06-11 LAB — RESP PANEL BY RT-PCR (RSV, FLU A&B, COVID)  RVPGX2
Influenza A by PCR: NEGATIVE
Influenza B by PCR: NEGATIVE
Resp Syncytial Virus by PCR: NEGATIVE
SARS Coronavirus 2 by RT PCR: NEGATIVE

## 2022-06-11 MED ORDER — DIPHENHYDRAMINE HCL 50 MG/ML IJ SOLN
25.0000 mg | Freq: Once | INTRAMUSCULAR | Status: AC
  Administered 2022-06-11: 25 mg via INTRAVENOUS
  Filled 2022-06-11: qty 1

## 2022-06-11 MED ORDER — METHYLPREDNISOLONE SODIUM SUCC 125 MG IJ SOLR
125.0000 mg | Freq: Once | INTRAMUSCULAR | Status: AC
  Administered 2022-06-11: 125 mg via INTRAVENOUS
  Filled 2022-06-11: qty 2

## 2022-06-11 MED ORDER — PREDNISONE 10 MG PO TABS: 20.0000 mg | ORAL_TABLET | Freq: Every day | ORAL | 0 refills | Status: AC

## 2022-06-11 NOTE — ED Provider Notes (Signed)
[place] EMERGENCY DEPARTMENT AT [facility] Provider Note   CSN: [id number] Arrival date & time: 06/11/22  4098     History  Chief Complaint  Patient presents with   Allergic Reaction    Alexander [name] is a 62 y.o. male.  HPI 62 year old male history of head neck cancer, asthma, presents today after possible allergic reaction.  Patient states he drank ginger tea with honey and had added honey.  He then felt like he was having difficulty swallowing and like he was unable to breathe through the back of his throat but states he was breathing through his nose fine.  He felt like he had some tingling in his mouth.  Symptoms have improved.  He did not have any sweating, lightheadedness, dyspnea, or GI symptoms.  He has not had an allergic reaction in the past.  Does have a history of head and neck cancer which has previously been treated.  He has a history of asthma.  He has had some URI symptoms over the past several days.     Home Medications Prior to Admission medications   Medication Sig Start Date End Date Taking? Authorizing Provider  predniSONE (DELTASONE) 10 MG tablet Take 2 tablets (20 mg total) by mouth daily for 5 days. 06/11/22 06/16/22 Yes Margarita Grizzle, MD  albuterol (PROVENTIL) (2.5 MG/3ML) 0.083% nebulizer solution Take 3 mLs (2.5 mg total) by nebulization every 6 (six) hours as needed for wheezing or shortness of breath. 05/17/20   Icard, Rachel Bo, DO  albuterol (VENTOLIN HFA) 108 (90 Base) MCG/ACT inhaler Inhale 2 puffs into the lungs as needed for wheezing or shortness of breath.    [provider]  cetirizine (ZYRTEC) 10 MG tablet Take 10 mg by mouth daily.    [provider]  fluticasone (FLONASE) 50 MCG/ACT nasal spray Place 2 sprays into both nostrils daily. Place 1 spray each nostril at night 05/25/20   [name], MD  Fluticasone-Salmeterol (ADVAIR) 100-50 MCG/DOSE AEPB Inhale 1 puff into the lungs daily.    [provider]  gabapentin (NEURONTIN) 300 MG capsule Take 1 capsule (300 mg total) by mouth at bedtime. Patient not taking: Reported on 03/07/2022 05/28/21   [name], MD  omeprazole (PRILOSEC) 40 MG capsule Take 40 mg by mouth daily.    [provider]  sodium fluoride (SODIUM FLUORIDE 5000 PPM) 1.1 % GEL dental gel Place pea-size drop into each tooth space of fluoride trays once a day at bedtime. Leave trays in mouth for 5 minutes, then remove and spit out excess fluoride. Do not rinse with water, eat or drink for at least 30 minutes after use. Patient not taking: Reported on 03/07/2022 01/04/21   Alda Berthold B, DMD      Allergies    Patient has no known allergies.    Review of Systems   Review of Systems  Physical Exam Updated Vital Signs BP 132/81 (BP Location: Right Arm)   Pulse 84   Temp 98.6 F (37 C) (Oral)   Resp (!) 23   Ht 1.905 m ( )   Wt 117.9 kg   SpO2 98%   BMI 32.50 kg/m  Physical Exam Vitals and nursing note reviewed.  Constitutional:      Appearance: He is well-developed.  HENT:     Head: Normocephalic and atraumatic.     Right Ear: External ear normal.     Left Ear: External ear normal.     Nose: Nose normal.  Mouth/Throat:     Mouth: Mucous membranes are moist.     Pharynx: Oropharynx is clear.  Eyes:     Extraocular Movements: Extraocular movements intact.     Conjunctiva/sclera: Conjunctivae normal.     Pupils: Pupils are equal, round, and reactive to light.  Cardiovascular:     Rate and Rhythm: Normal rate and regular rhythm.     Heart sounds: Normal heart sounds.  Pulmonary:     Effort: Pulmonary effort is normal. No respiratory distress.     Breath sounds: Normal breath sounds. No wheezing.  Chest:     Chest wall: No tenderness.  Abdominal:     General: Bowel sounds are normal. There is no distension.     Palpations: Abdomen is soft. There is no mass.     Tenderness: There is no abdominal tenderness. There is no guarding.   Musculoskeletal:        General: Normal range of motion.     Cervical back: Normal range of motion and neck supple.  Skin:    General: Skin is warm and dry.  Neurological:     Mental Status: He is alert and oriented to person, place, and time.     Motor: No abnormal muscle tone.     Coordination: Coordination normal.     Deep Tendon Reflexes: Reflexes are normal and symmetric.  Psychiatric:        Behavior: Behavior normal.        Thought Content: Thought content normal.        Judgment: Judgment normal.     ED Results / Procedures / Treatments   Labs (all labs ordered are listed, but only abnormal results are displayed) Labs Reviewed  RESP PANEL BY RT-PCR (RSV, FLU A&B, COVID)  [id number]    EKG None  Radiology DG Chest Port 1 View  Result Date: 06/11/2022 CLINICAL DATA:  Drank T with honey followed by dysphagia and tingling in tongue and lips. Cough. EXAM: PORTABLE CHEST 1 VIEW COMPARISON:  Chest radiograph 05/29/2020 FINDINGS: A right chest wall port is in place with the tip terminating in the region of the cavoatrial junction. The cardiomediastinal silhouette is normal. There is no focal consolidation or pulmonary edema. There is no pleural effusion or pneumothorax There is no acute osseous abnormality. IMPRESSION: No radiographic evidence of acute cardiopulmonary process. Electronically Signed   By: Lesia Hausen M.D.   On: 06/11/2022 10:58    Procedures Procedures    Medications Ordered in ED Medications  methylPREDNISolone sodium succinate (SOLU-MEDROL) 125 mg/2 mL injection 125 mg (125 mg Intravenous Given 06/11/22 1114)  diphenhydrAMINE (BENADRYL) injection 25 mg (25 mg Intravenous Given 06/11/22 1115)    ED Course/ Medical Decision Making/ A&P Clinical Course as of 06/11/22 1243  Wed Jun 11, 2022  1114 Chest x-Alleyah Twombly reviewed and interpreted no evidence of acute abnormality noted and radiologist interpretation concurs [DR]  1213 Remains hemodynamically stable and is  not having any symptoms of difficulty breathing or difficulty speaking. [DR]    Clinical Course User Index [DR] Margarita Grizzle, MD                             Medical Decision Making Amount and/or Complexity of Data Reviewed Radiology: ordered.  Risk Prescription drug management.   62 year old male with some tingling in mouth after drinking ginger honey tea that had honey mixed in.  He has been drinking this for some URI symptoms over the  past several days Differential diagnosis includes was not limited to local allergic reaction, other paresthesias, acute allergic reaction, infectious etiology Patient appears to be hemodynamically stable and no obvious signs or symptoms of allergic reaction on my exam.  Given that he has had some asthma and has had URI symptoms with wheezing, patient received Solu-Medrol.  He also received Benadryl.  Patient was observed for several hours and remained asymptomatic here in the ED.  Chest x-Aloni Chuang was also obtained due to the fact that he had his URI symptoms no evidence of acute or maladies noted. Plan discharged home with short course of prednisone and to continue Benadryl. We have discussed return precautions need for follow-up and he voices understanding.       Final Clinical Impression(s) / ED Diagnoses Final diagnoses:  Dysphagia, unspecified type    Rx / DC Orders ED Discharge Orders          Ordered    predniSONE (DELTASONE) 10 MG tablet  Daily        06/11/22 1242              Margarita Grizzle, MD 06/11/22 1243

## 2022-06-11 NOTE — ED Notes (Signed)
Pt states he did a telehealth call yesterday and was prescribed Tessalon Perles for cough. He states he took the [place] along with Nyquil this morning before he drank the tea. The tingling began while trying to swallow the tea.

## 2022-06-11 NOTE — Discharge Instructions (Addendum)
Please continue the prednisone daily for the next 5 days.  Please continue Benadryl every 6 hours. Return immediately to the emergency department if you have difficulty swallowing or breathing. Your chest x-Kampbell Holaway today showed no evidence of infection and COVID and flu tests are negative.

## 2022-06-11 NOTE — ED Triage Notes (Signed)
Pt bib EMS due to drinking tea with honey and being to have dysphagia with tingling in his tongue and lips. As of now tingling has resolved, but does have a raspy voice, no stridor, lungs clear with inspiratory wheezes slightly upper chest. VS B/P: 131/76, P: 93, O2Sats: 96%, R: 20, CBG 133. Denies any itching. No hives observed. No meds given and no h/o allergic reaction in the past.

## 2022-06-11 NOTE — ED Notes (Signed)
Pt. Discharged home. Discharge instructions discussed. No s/s of distress observed during discharge.

## 2022-06-17 NOTE — Progress Notes (Signed)
Alexander [name] presents today for follow-up after completing radiation to his base of tongue and subcarinal node on 11/07/2020 and to review CT results with this visit.    Pain issues, if any: Denies Using a feeding tube?: N/A Weight changes, if any:  Wt Readings from Last 3 Encounters:  07/01/22 266 lb 6.4 oz (120.8 kg)  06/11/22 260 lb (117.9 kg)  05/30/22 265 lb 8 oz (120.4 kg)   Swallowing issues, if any: Denies--reports he's able to tolerate a wide variety of food and beverages Smoking or chewing tobacco? None Using fluoride trays daily? Using fluoride toothpaste and denies any new dental concerns Last ENT visit was on: Saw Dr. Belenda Cruise on 06/19/2022 --Flexible Laryngoscopy Procedure  Procedure: After adequate topical anesthetic was applied, 4 mm flexible laryngoscope was passed through the nasal cavity without difficulty. Flexible laryngoscopy shows patent anterior nasal cavity with minimal crusting, no discharge or infection. Copious clear rhinorrhea noted, with some crusting and scabbing along the base of tongue on the left, with persistent asymmetric mucosal edema. No fulminant lesion or mass appreciated. Normal base of tongue and supraglottis Normal vocal cord mobility without vocal cord nodule, mass, polyp or tumor. Hypopharynx normal without mass, pooling of secretions or aspiration. Patient tolerated procedure without complication or difficulty.   --Impression & Plans:  Alexander [name] is a 62 y.o. 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 Complete head neck examination today including nasal laryngoscopy demonstrates significant postnasal drainage as well as persistent mucosal edema of the left glossotonsillar sulcus with no  obvious mass lesion. Patient is scheduled for a repeat CT neck with contrast next week. We discussed that this will be very helpful in further determining this mucosal asymmetry, which was also noted at time of last nasolaryngoscopy in October. Patient will be scheduled with me for a follow-up in approximately 6 months, however if tissue sampling is advisable based on results of CT scan, I will reach out to patient.   Other notable issues, if any: Went to the ED after an allergic reaction last month. Reports all symptoms resolved except for lingering vocal hoarseness and postnasal drip. Denies any new lumps, bumps, or swelling under his chin or neck. Overall reports he feels good and is doing well

## 2022-06-18 ENCOUNTER — Telehealth: Payer: Self-pay | Admitting: *Deleted

## 2022-06-18 NOTE — Telephone Encounter (Signed)
CALLED PATIENT TO INFORM OF CT FOR 06-27-22- ARRIVAL TIME- 12:45 PM @ [facility], NO RESTRICTIONS TO TEST, PATIENT TO RECEIVE RESULTS FROM DR. SQUIRE ON 07-01-22 @ 3 PM, SPOKE WITH PATIENT AND HE IS AWARE OF THESE APPTS. AND THE INSTRUCTIONS

## 2022-06-20 ENCOUNTER — Ambulatory Visit: Payer: PRIVATE HEALTH INSURANCE | Admitting: Radiation Oncology

## 2022-06-25 ENCOUNTER — Ambulatory Visit: Payer: PRIVATE HEALTH INSURANCE | Admitting: Radiation Oncology

## 2022-06-27 ENCOUNTER — Ambulatory Visit (HOSPITAL_BASED_OUTPATIENT_CLINIC_OR_DEPARTMENT_OTHER)
Admission: RE | Admit: 2022-06-27 | Discharge: 2022-06-27 | Disposition: A | Discharge status: Home / Self Care | Payer: BC Managed Care – PPO | Source: Ambulatory Visit | Attending: Radiation Oncology | Admitting: Radiation Oncology

## 2022-06-27 DIAGNOSIS — C01 Malignant neoplasm of base of tongue: Secondary | ICD-10-CM

## 2022-06-27 MED ORDER — IOHEXOL 300 MG/ML  SOLN
100.0000 mL | Freq: Once | INTRAMUSCULAR | Status: AC | PRN
  Administered 2022-06-27: 100 mL via INTRAVENOUS

## 2022-07-01 ENCOUNTER — Ambulatory Visit
Admission: RE | Admit: 2022-07-01 | Discharge: 2022-07-01 | Disposition: A | Discharge status: Home / Self Care | Payer: PRIVATE HEALTH INSURANCE | Source: Ambulatory Visit | Attending: Hematology and Oncology | Admitting: Hematology and Oncology

## 2022-07-01 ENCOUNTER — Encounter: Payer: Self-pay | Admitting: Radiation Oncology

## 2022-07-01 VITALS — BP 118/81 | HR 83 | Temp 98.1°F | Resp 20 | Wt 266.4 lb

## 2022-07-01 DIAGNOSIS — R599 Enlarged lymph nodes, unspecified: Secondary | ICD-10-CM | POA: Insufficient documentation

## 2022-07-01 DIAGNOSIS — C01 Malignant neoplasm of base of tongue: Secondary | ICD-10-CM

## 2022-07-01 DIAGNOSIS — R0982 Postnasal drip: Secondary | ICD-10-CM | POA: Insufficient documentation

## 2022-07-01 DIAGNOSIS — Z7951 Long term (current) use of inhaled steroids: Secondary | ICD-10-CM | POA: Diagnosis not present

## 2022-07-01 DIAGNOSIS — Z79899 Other long term (current) drug therapy: Secondary | ICD-10-CM | POA: Insufficient documentation

## 2022-07-01 DIAGNOSIS — R682 Dry mouth, unspecified: Secondary | ICD-10-CM | POA: Diagnosis not present

## 2022-07-01 DIAGNOSIS — M47814 Spondylosis without myelopathy or radiculopathy, thoracic region: Secondary | ICD-10-CM | POA: Diagnosis not present

## 2022-07-01 DIAGNOSIS — Z85810 Personal history of malignant neoplasm of tongue: Secondary | ICD-10-CM | POA: Insufficient documentation

## 2022-07-01 DIAGNOSIS — Z923 Personal history of irradiation: Secondary | ICD-10-CM | POA: Insufficient documentation

## 2022-07-01 DIAGNOSIS — K110 Atrophy of salivary gland: Secondary | ICD-10-CM | POA: Diagnosis not present

## 2022-07-01 DIAGNOSIS — R490 Dysphonia: Secondary | ICD-10-CM | POA: Insufficient documentation

## 2022-07-01 NOTE — Progress Notes (Signed)
Radiation Oncology         [phone] ________________________________  Name: Alexander [name] MRN: [id number]  Date: 07/01/2022  DOB: 1960/07/14  Follow-Up Visit Note  Outpatient  CC: System, Provider Not In  [name], *  Diagnosis and Prior Radiotherapy: C01   ICD-10-CM   1. 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) Staging form: Pharynx - HPV-Mediated Oropharynx, AJCC 8th Edition - Clinical stage from 05/18/2020: Stage IV (cT4, cN3, cM1, p16+) - Signed by [name], MD on 12/10/2020 - Pathologic stage from 06/01/2020: No Stage Recommended (cT4, cN3, cM1, p16+) - Signed by [name], MD on 06/01/2020 Stage prefix: Initial diagnosis    Radiation Treatment Dates: 09/19/2020 through 11/07/2020 Site Technique Total Dose (Gy) Dose per Fx (Gy) Completed Fx Beam Energies  Neck: HN_BOT IMRT 70/70 2 35/35 6X  Mediastinum: Chest_subcari IMRT 66/66 2 33/33 6X   CHIEF COMPLAINT: Here for follow-up and surveillance of throat cancer  Narrative:    Alexander [name] presents today for follow-up after completing radiation to his base of tongue and subcarinal node on 11/07/2020 and to review CT scan from 06/27/22.   Pain issues, if any: Denies Using a feeding tube?: N/A Weight changes, if any:     Wt Readings from Last 3 Encounters:  07/01/22 266 lb 6.4 oz (120.8 kg)  06/11/22 260 lb (117.9 kg)  05/30/22 265 lb 8 oz (120.4 kg)    Swallowing issues, if any: Denies--reports he's able to tolerate a wide variety of food and beverages Smoking or chewing tobacco? None Using fluoride trays daily? Using fluoride toothpaste and denies any new dental concerns Last ENT visit was on: Saw Dr. Belenda Cruise on 06/19/2022 --Flexible Laryngoscopy Procedure  Procedure: After adequate topical anesthetic was applied, 4 mm flexible laryngoscope was passed through the nasal cavity without  difficulty. Flexible laryngoscopy shows patent anterior nasal cavity with minimal crusting, no discharge or infection. Copious clear rhinorrhea noted, with some crusting and scabbing along the base of tongue on the left, with persistent asymmetric mucosal edema. No fulminant lesion or mass appreciated. Normal base of tongue and supraglottis Normal vocal cord mobility without vocal cord nodule, mass, polyp or tumor. Hypopharynx normal without mass, pooling of secretions or aspiration. Patient tolerated procedure without complication or difficulty.    --Impression & Plans:  Alexander [name] is a 62 y.o. 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 Complete head neck examination today including nasal laryngoscopy demonstrates significant postnasal drainage as well as persistent mucosal edema of the left glossotonsillar sulcus with no obvious mass lesion. Patient is scheduled for a repeat CT neck with contrast next week. We discussed that this will be very helpful in further determining this mucosal asymmetry, which was also noted at time of last nasolaryngoscopy in October. Patient will be scheduled with me for a follow-up in approximately 6 months, however if tissue sampling is advisable based on results of CT scan, I will reach out to patient.    Other notable issues, if any: Went to the ED after an allergic reaction last month. Reports all symptoms resolved except for lingering vocal hoarseness and postnasal drip. Denies any new lumps, bumps,  or swelling under his chin or neck. Overall reports he feels good and is doing well   ALLERGIES:  has No Known Allergies.  Meds: Current Outpatient Medications  Medication Sig Dispense Refill   albuterol (PROVENTIL)  (2.5 MG/3ML) 0.083% nebulizer solution Take 3 mLs (2.5 mg total) by nebulization every 6 (six) hours as needed for wheezing or shortness of breath. 120 mL 12   albuterol (VENTOLIN HFA) 108 (90 Base) MCG/ACT inhaler Inhale 2 puffs into the lungs as needed for wheezing or shortness of breath.     cetirizine (ZYRTEC) 10 MG tablet Take 10 mg by mouth daily.     fluticasone (FLONASE) 50 MCG/ACT nasal spray Place 2 sprays into both nostrils daily. Place 1 spray each nostril at night 16 g 11   Fluticasone-Salmeterol (ADVAIR) 100-50 MCG/DOSE AEPB Inhale 1 puff into the lungs daily.     gabapentin (NEURONTIN) 300 MG capsule Take 1 capsule (300 mg total) by mouth at bedtime. (Patient not taking: Reported on 03/07/2022) 30 capsule 1   omeprazole (PRILOSEC) 40 MG capsule Take 40 mg by mouth daily.     sodium fluoride (SODIUM FLUORIDE 5000 PPM) 1.1 % GEL dental gel Place pea-size drop into each tooth space of fluoride trays once a day at bedtime. Leave trays in mouth for 5 minutes, then remove and spit out excess fluoride. Do not rinse with water, eat or drink for at least 30 minutes after use. (Patient not taking: Reported on 03/07/2022) 120 mL 11   No current facility-administered medications for this encounter.   Facility-Administered Medications Ordered in Other Encounters  Medication Dose Route Frequency Provider Last Rate Last Admin   magnesium sulfate 2 GM/50ML IVPB             Physical Findings: The patient is in no acute distress. Patient is alert and oriented.  weight is 266 lb 6.4 oz (120.8 kg). His temperature is 98.1 F (36.7 C). His blood pressure is 118/81 and his pulse is 83. His respiration is 20 and oxygen saturation is 99%. [name]    HEENT: Oropharynx is clear from any masses or lesions.  EOMI Skin: Well healed skin in radiation treatment fields.  NECK: No palpable masses in the cervical or supraclavicular neck.   Lymphedema has improved greatly and is mild Heart: RRR, no edema Chest: CTAB -  PAC in upper right chest Ext: no edema Abd: soft , NT/ ND  Lab Findings: Lab Results  Component Value Date   WBC 5.1 05/30/2022   HGB 14.2 05/30/2022   HCT 41.4 05/30/2022   MCV 93.7 05/30/2022   PLT 135 (L) 05/30/2022    Radiographic Findings: CT Soft Tissue Neck W Contrast  Result Date: 06/30/2022 CLINICAL DATA:  Head neck cancer follow-up.  Base of tongue cancer EXAM: CT NECK WITH CONTRAST TECHNIQUE: Multidetector CT imaging of the neck was performed using the standard protocol following the bolus administration of intravenous contrast. RADIATION DOSE REDUCTION: This exam was performed according to the departmental dose-optimization program which includes automated exposure control, adjustment of the mA and/or kV according to patient size and/or use of iterative reconstruction technique. CONTRAST:  OMNIPAQUE IOHEXOL 300 MG/ML  SOLN COMPARISON:  11/21/2021 FINDINGS: Pharynx and larynx: Posterior bulging at the right tongue base with submucosal soft tissue density is unchanged without altered enhancement submucosal infiltration. Thickening of the epiglottis, likely treatment related. No metachronous mass seen along the mucosal surfaces. Salivary glands: Submandibular gland atrophy Thyroid: Normal. Lymph nodes: Bilateral jugulodigastric node enlargement with  mild calcification is unchanged, 13 mm in length on the right and 15 mm in length on the left. No enlarging or heterogeneous node. Vascular: Unremarkable Limited intracranial: Unremarkable Visualized orbits: Unremarkable Mastoids and visualized paranasal sinuses: Patchy mucosal thickening in the paranasal sinuses, also seen on prior. Secretions in the right maxillary sinus. There is been mild progression since before. Skeleton: No acute or aggressive finding. Upper chest: Located porta catheter on the right.  No nodularity. IMPRESSION: No change from 11/21/2021 to suggest recurrence. Electronically Signed   By: Tiburcio Pea M.D.   On:  06/30/2022 11:00   CT Chest W Contrast  Result Date: 06/30/2022 CLINICAL DATA:  Head and neck cancer. Squamous cell carcinoma of the base of the tongue. * Tracking Code: BO * EXAM: CT CHEST WITH CONTRAST TECHNIQUE: Multidetector CT imaging of the chest was performed during intravenous contrast administration. RADIATION DOSE REDUCTION: This exam was performed according to the departmental dose-optimization program which includes automated exposure control, adjustment of the mA and/or kV according to patient size and/or use of iterative reconstruction technique. CONTRAST:  OMNIPAQUE IOHEXOL 300 MG/ML  SOLN COMPARISON:  CT 11/21/2021 and older.  PET-CT 06/17/2021 and older. FINDINGS: Cardiovascular: Right upper chest port. Heart is nonenlarged. No pericardial effusion. The thoracic aorta has a normal course and caliber. Mediastinum/Nodes: No specific abnormal lymph node enlargement identified in the axillary regions, hilum or mediastinum. Normal caliber thoracic esophagus. Preserved thyroid gland. Lungs/Pleura: There is some linear opacity lung bases likely scar or atelectasis. No consolidation, pneumothorax or effusion. Upper Abdomen: No acute abnormality. Musculoskeletal: Scattered degenerative changes along the thoracic spine with some endplate osteophytes and multilevel disc height loss. IMPRESSION: No acute cardiopulmonary disease.  Chest port. No developing mass lesion, fluid collection or lymph node enlargement in the thorax. Please see separate dictation of neck CT scan Electronically Signed   By: Karen Kays M.D.   On: 06/30/2022 10:59   DG Chest Port 1 View  Result Date: 06/11/2022 CLINICAL DATA:  Drank T with honey followed by dysphagia and tingling in tongue and lips. Cough. EXAM: PORTABLE CHEST 1 VIEW COMPARISON:  Chest radiograph 05/29/2020 FINDINGS: A right chest wall port is in place with the tip terminating in the region of the cavoatrial junction. The cardiomediastinal silhouette is  normal. There is no focal consolidation or pulmonary edema. There is no pleural effusion or pneumothorax There is no acute osseous abnormality. IMPRESSION: No radiographic evidence of acute cardiopulmonary process. Electronically Signed   By: Lesia Hausen M.D.   On: 06/11/2022 10:58    Impression/Plan:   1) Head and Neck Cancer Status: in remission -  no evidence of disease progression or recurrence on recent imaging or physical exam today two years post therapy. We personally reviewed his images together.   2) Nutritional Status: no active issues, PEG removed, pt is pleased with weight and eating.     3) Swallowing: Denies - is able to eat a wide variety of food.  4) Dental: Still having dry mouth. Encouraged to continue regular followup with dentistry, and dental hygiene including fluoride rinses.  Was a patient of Dr. Frederik Pear, but has not seen a dentist since he retired. Recommend he finds a new dentist and to let them know his history of radiation.  Recommended fluoride supplements, ie ACT mouthwash QHS  5) Thyroid function - check annually. Encouraged him to find a PCP and to have them follow up with TSH annually.  Lab Results  Component Value Date  TSH 3.338 11/28/2021   6) Scheduled to see Dr. Al Pimple in late October. We will order a CT scan of his neck and chest prior to that appointment for them to review. He will continue follow up with medical oncology and Dr. [name].   Follow up with radiation oncology PRN. It was a pleasure taking part in this patient's care. He knows to call back with any questions or concerns that arise.   On date of service, in total, I spent 25 minutes on this encounter. Patient was seen in person.  _____________________________________   Joyice Faster, PA-C   [name], MD

## 2022-07-01 NOTE — Progress Notes (Signed)
Oncology Nurse Navigator Documentation   I met with [name] before and during his follow up with Dr. Basilio Cairo today. He is doing well. He got the results of his CT scans today and knows that he will have another CT scan of his neck and chest in October before he sees Dr. Al Pimple. He has my contact information and knows to call me if he has any questions or concerns at any time.   Hedda Slade RN, BSN, OCN Head & Neck Oncology Nurse Navigator [facility] Phone # [phone]  Fax # [phone]

## 2022-07-02 ENCOUNTER — Other Ambulatory Visit: Payer: Self-pay

## 2022-07-02 DIAGNOSIS — C01 Malignant neoplasm of base of tongue: Secondary | ICD-10-CM

## 2022-07-04 ENCOUNTER — Other Ambulatory Visit: Payer: Self-pay

## 2022-07-04 ENCOUNTER — Inpatient Hospital Stay: Payer: PRIVATE HEALTH INSURANCE | Attending: Hematology and Oncology

## 2022-07-04 DIAGNOSIS — C01 Malignant neoplasm of base of tongue: Secondary | ICD-10-CM | POA: Insufficient documentation

## 2022-07-04 DIAGNOSIS — Z95828 Presence of other vascular implants and grafts: Secondary | ICD-10-CM

## 2022-07-04 MED ORDER — SODIUM CHLORIDE 0.9% FLUSH
10.0000 mL | Freq: Once | INTRAVENOUS | Status: AC
  Administered 2022-07-04: 10 mL

## 2022-07-04 MED ORDER — HEPARIN SOD (PORK) LOCK FLUSH 100 UNIT/ML IV SOLN
500.0000 [IU] | Freq: Once | INTRAVENOUS | Status: AC
  Administered 2022-07-04: 500 [IU]

## 2022-07-21 ENCOUNTER — Telehealth: Payer: Self-pay | Admitting: *Deleted

## 2022-07-21 NOTE — Telephone Encounter (Signed)
Envelope received in incoming forms folder from [facility] reads "Unable to identify a claim.  Resend if this is an error with identifying information."  Copy of same letter sent to [facility] [address] [place], [place].. [address] in April 2024.  Includes office note reads referral by [facility] medical oncologist.  No referrals noted.  Envelope to collaborative nurse at this time for further actions if needed.

## 2022-08-01 ENCOUNTER — Encounter: Payer: Self-pay | Admitting: Hematology and Oncology

## 2022-08-15 ENCOUNTER — Other Ambulatory Visit: Payer: Self-pay

## 2022-08-15 ENCOUNTER — Inpatient Hospital Stay: Payer: PRIVATE HEALTH INSURANCE | Attending: Hematology and Oncology

## 2022-08-15 DIAGNOSIS — C01 Malignant neoplasm of base of tongue: Secondary | ICD-10-CM | POA: Diagnosis not present

## 2022-08-15 DIAGNOSIS — Z95828 Presence of other vascular implants and grafts: Secondary | ICD-10-CM

## 2022-08-15 MED ORDER — HEPARIN SOD (PORK) LOCK FLUSH 100 UNIT/ML IV SOLN
500.0000 [IU] | Freq: Once | INTRAVENOUS | Status: AC
  Administered 2022-08-15: 500 [IU]

## 2022-08-15 MED ORDER — SODIUM CHLORIDE 0.9% FLUSH
10.0000 mL | Freq: Once | INTRAVENOUS | Status: AC
  Administered 2022-08-15: 10 mL

## 2022-09-09 ENCOUNTER — Encounter (HOSPITAL_BASED_OUTPATIENT_CLINIC_OR_DEPARTMENT_OTHER): Payer: Self-pay | Admitting: Pulmonary Disease

## 2022-09-09 ENCOUNTER — Ambulatory Visit (INDEPENDENT_AMBULATORY_CARE_PROVIDER_SITE_OTHER): Payer: PRIVATE HEALTH INSURANCE | Admitting: Pulmonary Disease

## 2022-09-09 ENCOUNTER — Other Ambulatory Visit: Payer: Self-pay | Admitting: Pulmonary Disease

## 2022-09-09 VITALS — BP 112/68 | HR 95 | Resp 20 | Ht 75.0 in | Wt 279.0 lb

## 2022-09-09 DIAGNOSIS — J4540 Moderate persistent asthma, uncomplicated: Secondary | ICD-10-CM | POA: Diagnosis not present

## 2022-09-09 MED ORDER — TRELEGY ELLIPTA 200-62.5-25 MCG/ACT IN AEPB: 1.0000 | INHALATION_SPRAY | Freq: Every day | RESPIRATORY_TRACT | 3 refills | Status: DC

## 2022-09-09 MED ORDER — TRELEGY ELLIPTA 200-62.5-25 MCG/ACT IN AEPB: 1.0000 | INHALATION_SPRAY | Freq: Every day | RESPIRATORY_TRACT | Status: DC

## 2022-09-09 NOTE — Patient Instructions (Signed)
Trelegy one puff daily, and rinse your mouth after each use.  Stop advair once you start using trelegy.  Call if your symptoms are improved after switching inhalers.  Follow up in 6 months.

## 2022-09-09 NOTE — Progress Notes (Signed)
Alexander Alexander [name] Alexander [name], Alexander Alexander [name] Alexander [name], Alexander Alexander [name] Alexander [name]  Chief Complaint  Patient presents with   Follow-up    6 month follow up , having coughing with yellow mucus at night, having wheezing , using otc mususix dm , no fever no shotness  of breath happen at night    Past Surgical History:  He  has a past surgical history that includes Tonsillectomy; Mass excision (N/A, 05/09/2020); Direct laryngoscopy (05/09/2020); Tracheostomy tube placement (05/09/2020); Video bronchoscopy with endobronchial ultrasound (N/A, 05/22/2020); Bronchial needle aspiration biopsy (05/22/2020); IR IMAGING GUIDED PORT INSERTION (06/12/2020); IR GASTROSTOMY TUBE MOD SED (09/13/2020); Alexander Alexander [name] IR GASTROSTOMY TUBE REMOVAL/REPAIR (12/14/2020).  Past Medical History:  Squamous cell carcinoma of the tongue, Asthma, GERD  Constitutional:  BP 112/68   Pulse 95   Resp 20   Ht 6\' 3"  (1.905 m)   Wt 279 lb (126.6 kg)   SpO2 96%   BMI 34.87 kg/m   Brief Summary:  Alexander Alexander [name] Alexander [name] is a 62 y.o. male with allergic asthma.  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      Subjective:   He has more cough Alexander Alexander [name] chest congestion at night.  Gets episodes of wheezing also.  Does okay during the day.  Doesn't feel like he has issue with his sleep otherwise.  Uses advair.  Albuterol helps some.  Not having sinus congestion or post nasal drip.  Not having reflux.  Physical Exam:   Appearance - well kempt   ENMT - no sinus tenderness, no oral exudate, no LAN, Mallampati 3 airway, no stridor, healed tracheostomy site  Respiratory - equal breath sounds bilaterally, no wheezing or rales  CV - s1s2 regular rate Alexander Alexander [name] rhythm, no murmurs  Ext - no clubbing, no edema  Skin - no rashes  Psych - normal mood Alexander Alexander [name] affect   Alexander [name] testing:    Chest Imaging:    Sleep Tests:    Cardiac Tests:    Social History:  He  reports that he has never smoked. He has been exposed to tobacco smoke. He has never  used smokeless tobacco. He reports that he does not currently use alcohol. He reports that he does not use drugs.  Family History:  His family history includes Breast cancer in his mother; Cancer - Cervical in his mother; Colon cancer in his brother Alexander Alexander [name] father.     Assessment/Plan:   Moderate, persistent asthma. - will [name] - has progression of his symptoms - never got records from [place] - will have him try trelegy 200 one puff daily in place of advair - prn albuterol - if no improvement, then will need additional testing (PFT, CXR, CBC with diff, FeNO)  Allergic rhinitis. - prn flonase, zyrtec  Snoring. - he doesn't feel like his sleep is an issue after he lost wait - defer sleep testing for now  Squamous cell carcinoma of the tongue. - diagnosed in March 2022 - followed by Dr. [name] with oncology, Alexander Alexander [name] Dr. Cheron Schaumann with ENT   Time Spent Involved in Patient Alexander [name] on Day of Examination:  38 minutes  Follow up:   Patient Instructions  Trelegy one puff daily, Alexander Alexander [name] rinse your mouth after each use.  Stop advair once you start using trelegy.  Call if your symptoms are improved after switching inhalers.  Follow up in 6 months.  Medication List:   Allergies as of 09/09/2022       Reactions   Tessalon [benzonatate]  Medication List        Accurate as of September 09, 2022 11:09 AM. If you have any questions, ask your nurse or doctor.          STOP taking these medications    Fluticasone-Salmeterol 100-50 MCG/DOSE Aepb Commonly known as: ADVAIR Stopped by: Coralyn Helling   sodium fluoride 1.1 % Gel dental gel Commonly known as: Sodium Fluoride 5000 PPM Stopped by: Corry Ihnen       TAKE these medications    albuterol 108 (90 Base) MCG/ACT inhaler Commonly known as: VENTOLIN HFA Inhale 2 puffs into the lungs as needed for wheezing or shortness of breath.   albuterol (2.5 MG/3ML) 0.083% nebulizer  solution Commonly known as: PROVENTIL Take 3 mLs (2.5 mg total) by nebulization every 6 (six) hours as needed for wheezing or shortness of breath.   cetirizine 10 MG tablet Commonly known as: ZYRTEC Take 10 mg by mouth daily.   fluticasone 50 MCG/ACT nasal spray Commonly known as: FLONASE Place 2 sprays into both nostrils daily. Place 1 spray each nostril at night   gabapentin 300 MG capsule Commonly known as: NEURONTIN Take 1 capsule (300 mg total) by mouth at bedtime.   omeprazole 40 MG capsule Commonly known as: PRILOSEC Take 40 mg by mouth daily.   Trelegy Ellipta 200-62.5-25 MCG/ACT Aepb Generic drug: Fluticasone-Umeclidin-Vilant Inhale 1 puff into the lungs daily at 2 am. Started by: Coralyn Helling        Signature:  Coralyn Helling, MD [facility] Alexander [name]/Alexander Alexander [name] Alexander [name] Pager - [phone] 09/09/2022, 11:09 AM

## 2022-09-26 ENCOUNTER — Other Ambulatory Visit: Payer: Self-pay

## 2022-09-26 ENCOUNTER — Inpatient Hospital Stay: Payer: PRIVATE HEALTH INSURANCE | Attending: Hematology and Oncology

## 2022-09-26 DIAGNOSIS — C01 Malignant neoplasm of base of tongue: Secondary | ICD-10-CM | POA: Insufficient documentation

## 2022-09-26 DIAGNOSIS — Z95828 Presence of other vascular implants and grafts: Secondary | ICD-10-CM

## 2022-09-26 MED ORDER — SODIUM CHLORIDE 0.9% FLUSH
10.0000 mL | Freq: Once | INTRAVENOUS | Status: AC
  Administered 2022-09-26: 10 mL

## 2022-09-26 MED ORDER — HEPARIN SOD (PORK) LOCK FLUSH 100 UNIT/ML IV SOLN
500.0000 [IU] | Freq: Once | INTRAVENOUS | Status: AC
  Administered 2022-09-26: 500 [IU]

## 2022-10-01 ENCOUNTER — Encounter: Payer: Self-pay | Admitting: Hematology and Oncology

## 2022-10-21 ENCOUNTER — Telehealth (HOSPITAL_BASED_OUTPATIENT_CLINIC_OR_DEPARTMENT_OTHER): Payer: Self-pay | Admitting: Family Medicine

## 2022-10-21 NOTE — Progress Notes (Unsigned)
New Patient Office Visit  Subjective:   Alexander [name] 09/19/1960 10/22/2022  Chief Complaint  Patient presents with   New Patient (Initial Visit)    Pt is here to get established with the practice.     HPI: Alexander [name] presents today to establish care at [facility]. Introduced to Publishing rights manager role and practice setting.  All questions answered.   Last PCP: Candiss Norse, MD [facility]) Last annual physical:  Several Years.  Concerns: See below   Patient would like to have a flu vaccine. He recently had COVID in the past 1-2 weeks. He is feeling much better. He did course of COVID with Paxlovid and is still showing positive as of yesterday. He states last COVID vaccine was last September 2023.    LUMBAGO: Patient treated with Ambien initially and was changed to Flexeril. He states a few days ago he was sliding an air purifier over and began to feel a "snap" in his back. He states he tried to relax and rest and it took him several days before he was able to ambulate correctly without back pain.   The following portions of the patient's history were reviewed and updated as appropriate: past medical history, past surgical history, family history, social history, allergies, medications, and problem list.   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   Incipient enamel caries 03/08/2021   Chronic periodontal disease 03/08/2021   Acquired lymphedema 01/11/2021   History of head and neck  radiation 01/07/2021   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   Dysgeusia 01/07/2021   Accretions on teeth 01/07/2021   Pancytopenia, acquired (HCC) 12/07/2020   Dry mouth 12/07/2020   Leukopenia due to antineoplastic chemotherapy (HCC) 11/08/2020   Port-A-Cath in place 10/03/2020   Intractable hiccups 10/03/2020   Thrombocytopenia (HCC) 10/03/2020   Chemotherapy-induced fatigue 09/27/2020   Weight loss, unintentional 06/01/2020   Malnutrition of moderate degree 05/28/2020   GERD (gastroesophageal reflux disease)    Volume depletion    Hyperbilirubinemia    Hyponatremia    Mild protein-calorie malnutrition (HCC)    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   Mediastinal adenopathy 05/17/2020   Past Medical History:  Diagnosis Date   Asthma    Cancer (HCC)    GERD (gastroesophageal reflux disease)    Sleep apnea    CPAP- does not wear   Past Surgical History:  Procedure Laterality Date   BRONCHIAL NEEDLE ASPIRATION BIOPSY  05/22/2020   Procedure: BRONCHIAL NEEDLE ASPIRATION BIOPSIES;  Surgeon: Josephine Igo, DO;  Location: [facility];  Service: Pulmonary;;   DIRECT LARYNGOSCOPY  05/09/2020   Procedure: DIRECT LARYNGOSCOPY;  Surgeon: [name], MD;  Location: [facility] OR;  Service: ENT;;   IR GASTROSTOMY TUBE MOD SED  09/13/2020   IR GASTROSTOMY TUBE REMOVAL  12/14/2020   IR IMAGING GUIDED PORT INSERTION  06/12/2020   MASS EXCISION N/A 05/09/2020   Procedure: EXCISION TONGUE MASS;  Surgeon: [name], MD;  Location: [facility] OR;  Service: ENT;  Laterality: N/A;   TONSILLECTOMY  TRACHEOSTOMY TUBE PLACEMENT  05/09/2020   Procedure: TRACHEOSTOMY;  Surgeon: [name], MD;  Location: [facility] OR;  Service: ENT;;   VIDEO BRONCHOSCOPY WITH ENDOBRONCHIAL ULTRASOUND N/A 05/22/2020   Procedure: VIDEO BRONCHOSCOPY WITH ENDOBRONCHIAL ULTRASOUND;  Surgeon: Josephine Igo, DO;  Location: [facility];  Service: Pulmonary;  Laterality: N/A;   Family  History  Problem Relation Age of Onset   Breast cancer Mother    Cancer - Cervical Mother    Colon cancer Father    Colon cancer Brother    Social History   Socioeconomic History   Marital status: Single    Spouse name: Not on file   Number of children: Not on file   Years of education: Not on file   Highest education level: Not on file  Occupational History   Not on file  Tobacco Use   Smoking status: Never    Passive exposure: Past   Smokeless tobacco: Never  Vaping Use   Vaping status: Never Used  Substance and Sexual Activity   Alcohol use: Not Currently    Comment: "4-5 drinks a year for special ocassions" 05/08/20   Drug use: Never   Sexual activity: Not Currently  Other Topics Concern   Not on file  Social History Narrative   Not on file   Social Determinants of Health   Financial Resource Strain: Low Risk  (06/05/2020)   Overall Financial Resource Strain (CARDIA)    Difficulty of Paying Living Expenses: Not hard at all  Food Insecurity: No Food Insecurity (06/05/2020)   Hunger Vital Sign    Worried About Running Out of Food in the Last Year: Never true    Ran Out of Food in the Last Year: Never true  Transportation Needs: No Transportation Needs (06/05/2020)   PRAPARE - Administrator, Civil Service (Medical): No    Lack of Transportation (Non-Medical): No  Physical Activity: Not on file  Stress: No Stress Concern Present (06/05/2020)   [employer] of Occupational Health - Occupational Stress Questionnaire    Feeling of Stress : Not at all  Social Connections: Socially Isolated (06/05/2020)   Social Connection and Isolation Panel [NHANES]    Frequency of Communication with Friends and Family: More than three times a week    Frequency of Social Gatherings with Friends and Family: More than three times a week    Attends Religious Services: Never    Database administrator or Organizations: No    Attends Banker Meetings: Never     Marital Status: Separated  Intimate Partner Violence: Not on file   Outpatient Medications Prior to Visit  Medication Sig Dispense Refill   albuterol (PROVENTIL) (2.5 MG/3ML) 0.083% nebulizer solution Take 3 mLs (2.5 mg total) by nebulization every 6 (six) hours as needed for wheezing or shortness of breath. 120 mL 12   albuterol (VENTOLIN HFA) 108 (90 Base) MCG/ACT inhaler Inhale 2 puffs into the lungs as needed for wheezing or shortness of breath.     cetirizine (ZYRTEC) 10 MG tablet Take 10 mg by mouth daily.     fluticasone (FLONASE) 50 MCG/ACT nasal spray Place 2 sprays into both nostrils daily. Place 1 spray each nostril at night 16 g 11   Fluticasone-Umeclidin-Vilant (TRELEGY ELLIPTA) 200-62.5-25 MCG/ACT AEPB Inhale 1 puff into the lungs daily at 2 am. 90 each 3   gabapentin (NEURONTIN) 300 MG capsule Take 1 capsule (300 mg total) by mouth at bedtime. 30 capsule 1  omeprazole (PRILOSEC) 40 MG capsule Take 40 mg by mouth daily.     Fluticasone-Umeclidin-Vilant (TRELEGY ELLIPTA) 200-62.5-25 MCG/ACT AEPB Inhale 1 puff into the lungs daily.     Facility-Administered Medications Prior to Visit  Medication Dose Route Frequency Provider Last Rate Last Admin   magnesium sulfate 2 GM/50ML IVPB            Allergies  Allergen Reactions   Tessalon [Benzonatate]     ROS: A complete ROS was performed with pertinent positives/negatives noted in the HPI. The remainder of the ROS are negative.   Objective:   Today's Vitals   10/22/22 1311  BP: 125/80  Pulse: 91  SpO2: 99%  Weight: 280 lb (127 kg)  Height: 6\' 3"  (1.905 m)    GENERAL: Well-appearing, in NAD. Well nourished.  SKIN: Pink, warm and dry. No rash, lesion, ulceration, or ecchymoses.  Head: Normocephalic. NECK: Trachea midline. Full ROM w/o pain or tenderness. No lymphadenopathy.  EARS: Tympanic membranes are intact, translucent without bulging and without drainage. Appropriate landmarks visualized.  EYES: Conjunctiva clear  without exudates. EOMI, PERRL, no drainage present.  NOSE: Septum midline w/o deformity. Nares patent, mucosa pink and non-inflamed w/o drainage. No sinus tenderness.  THROAT: Uvula midline. Oropharynx clear. Tonsils non-inflamed without exudate. Mucous membranes pink and moist.  RESPIRATORY: Chest wall symmetrical. Respirations even and non-labored. Breath sounds clear to auscultation bilaterally.  CARDIAC: S1, S2 present, regular rate and rhythm without murmur or gallops. Peripheral pulses 2+ bilaterally.  MSK: Muscle tone and strength appropriate for age. Joints w/o tenderness, redness, or swelling.  EXTREMITIES: Without clubbing, cyanosis, or edema.  NEUROLOGIC: No motor or sensory deficits. Steady, even gait. C2-C12 intact.  PSYCH/MENTAL STATUS: Alert, oriented x 3. Cooperative, appropriate mood and affect.    Health Maintenance Due  Topic Date Due   Medicare Annual Wellness (AWV)  Never done   Hepatitis C Screening  Never done   DTaP/Tdap/Td (1 - Tdap) Never done   Colonoscopy  Never done   INFLUENZA VACCINE  09/18/2022   COVID-19 Vaccine (4 - 2023-24 season) 10/19/2022    No results found for any visits on 10/22/22.     Assessment & Plan:  There are no diagnoses linked to this encounter.    Patient to reach out to office if new, worrisome, or unresolved symptoms arise or if no improvement in patient's condition. Patient verbalized understanding and is agreeable to treatment plan. All questions answered to patient's satisfaction.    No follow-ups on file.   Of note, portions of this note may have been created with voice recognition software Physicist, medical). While this note has been edited for accuracy, occasional wrong-word or 'sound-a-like' substitutions may have occurred due to the inherent limitations of voice recognition software.  Yolanda Manges, FNP

## 2022-10-21 NOTE — Telephone Encounter (Signed)
Pt was covid + last week to a round of paxloid, last dose was Saturday --he is still testing positive, but is asymptomatic  Please call pt--appt tomorrow

## 2022-10-21 NOTE — Telephone Encounter (Signed)
Called and spoke with pt letting him know that he can keep his appt as scheduled and he verbalized understanding. Nothing further needed.

## 2022-10-21 NOTE — Telephone Encounter (Signed)
Patients have covid antibodies up to 3 months after testing positive to covid and can still tend to test positive during this timeframe.  Alexis, please advise if pt is still okay to come in for his appt tomorrow 9/4.

## 2022-10-22 ENCOUNTER — Encounter (HOSPITAL_BASED_OUTPATIENT_CLINIC_OR_DEPARTMENT_OTHER): Payer: Self-pay | Admitting: Family Medicine

## 2022-10-22 ENCOUNTER — Encounter: Payer: Self-pay | Admitting: Hematology and Oncology

## 2022-10-22 ENCOUNTER — Ambulatory Visit (INDEPENDENT_AMBULATORY_CARE_PROVIDER_SITE_OTHER): Payer: Medicare Other | Admitting: Family Medicine

## 2022-10-22 VITALS — BP 125/80 | HR 91 | Ht 75.0 in | Wt 280.0 lb

## 2022-10-22 DIAGNOSIS — Z1329 Encounter for screening for other suspected endocrine disorder: Secondary | ICD-10-CM | POA: Diagnosis not present

## 2022-10-22 DIAGNOSIS — Z13220 Encounter for screening for lipoid disorders: Secondary | ICD-10-CM | POA: Diagnosis not present

## 2022-10-22 DIAGNOSIS — M545 Low back pain, unspecified: Secondary | ICD-10-CM

## 2022-10-22 DIAGNOSIS — Z7689 Persons encountering health services in other specified circumstances: Secondary | ICD-10-CM

## 2022-10-22 DIAGNOSIS — G8929 Other chronic pain: Secondary | ICD-10-CM

## 2022-10-22 DIAGNOSIS — Z1159 Encounter for screening for other viral diseases: Secondary | ICD-10-CM | POA: Diagnosis not present

## 2022-10-22 MED ORDER — CYCLOBENZAPRINE HCL 10 MG PO TABS
10.0000 mg | ORAL_TABLET | Freq: Two times a day (BID) | ORAL | 2 refills | Status: DC | PRN
Start: 2022-10-22 — End: 2023-05-20

## 2022-10-22 NOTE — Patient Instructions (Addendum)
Get your lab work today.    [facility] Address: [address], [place], [place] [address] Hours:  Open ? Closes 5?PM Phone: [phone]

## 2022-10-23 LAB — CBC WITH DIFFERENTIAL/PLATELET
Basophils Absolute: 0 10*3/uL (ref 0.0–0.2)
Basos: 1 %
EOS (ABSOLUTE): 0.3 10*3/uL (ref 0.0–0.4)
Eos: 4 %
Hematocrit: 42.7 % (ref 37.5–51.0)
Hemoglobin: 14.7 g/dL (ref 13.0–17.7)
Immature Grans (Abs): 0 10*3/uL (ref 0.0–0.1)
Immature Granulocytes: 0 %
Lymphocytes Absolute: 1 10*3/uL (ref 0.7–3.1)
Lymphs: 15 %
MCH: 31.9 pg (ref 26.6–33.0)
MCHC: 34.4 g/dL (ref 31.5–35.7)
MCV: 93 fL (ref 79–97)
Monocytes Absolute: 0.6 10*3/uL (ref 0.1–0.9)
Monocytes: 8 %
Neutrophils Absolute: 4.9 10*3/uL (ref 1.4–7.0)
Neutrophils: 72 %
Platelets: 197 10*3/uL (ref 150–450)
RBC: 4.61 x10E6/uL (ref 4.14–5.80)
RDW: 13.5 % (ref 11.6–15.4)
WBC: 6.8 10*3/uL (ref 3.4–10.8)

## 2022-10-23 LAB — PSA: Prostate Specific Ag, Serum: 1.1 ng/mL (ref 0.0–4.0)

## 2022-10-23 LAB — COMPREHENSIVE METABOLIC PANEL
ALT: 13 IU/L (ref 0–44)
AST: 14 IU/L (ref 0–40)
Albumin: 4.3 g/dL (ref 3.9–4.9)
Alkaline Phosphatase: 58 IU/L (ref 44–121)
BUN/Creatinine Ratio: 12 (ref 10–24)
BUN: 14 mg/dL (ref 8–27)
Bilirubin Total: 0.5 mg/dL (ref 0.0–1.2)
CO2: 23 mmol/L (ref 20–29)
Calcium: 9.9 mg/dL (ref 8.6–10.2)
Chloride: 105 mmol/L (ref 96–106)
Creatinine, Ser: 1.17 mg/dL (ref 0.76–1.27)
Globulin, Total: 2.4 g/dL (ref 1.5–4.5)
Glucose: 88 mg/dL (ref 70–99)
Potassium: 4.4 mmol/L (ref 3.5–5.2)
Sodium: 142 mmol/L (ref 134–144)
Total Protein: 6.7 g/dL (ref 6.0–8.5)
eGFR: 70 mL/min/{1.73_m2} (ref >59)

## 2022-10-23 LAB — HEMOGLOBIN A1C
Est. average glucose Bld gHb Est-mCnc: 120 mg/dL
Hgb A1c MFr Bld: 5.8 % — ABNORMAL HIGH (ref 4.8–5.6)

## 2022-10-23 LAB — LIPID PANEL
Chol/HDL Ratio: 1.9 ratio (ref 0.0–5.0)
Cholesterol, Total: 152 mg/dL (ref 100–199)
HDL: 82 mg/dL (ref >39)
LDL Chol Calc (NIH): 52 mg/dL (ref 0–99)
Triglycerides: 98 mg/dL (ref 0–149)
VLDL Cholesterol Cal: 18 mg/dL (ref 5–40)

## 2022-10-23 LAB — TSH: TSH: 2.41 u[IU]/mL (ref 0.450–4.500)

## 2022-10-23 LAB — HEPATITIS C ANTIBODY: Hep C Virus Ab: NONREACTIVE

## 2022-11-07 ENCOUNTER — Inpatient Hospital Stay: Payer: PRIVATE HEALTH INSURANCE | Attending: Hematology and Oncology

## 2022-11-07 DIAGNOSIS — C01 Malignant neoplasm of base of tongue: Secondary | ICD-10-CM | POA: Insufficient documentation

## 2022-11-07 DIAGNOSIS — Z95828 Presence of other vascular implants and grafts: Secondary | ICD-10-CM

## 2022-11-07 MED ORDER — SODIUM CHLORIDE 0.9% FLUSH
10.0000 mL | Freq: Once | INTRAVENOUS | Status: AC
  Administered 2022-11-07: 10 mL

## 2022-11-07 MED ORDER — HEPARIN SOD (PORK) LOCK FLUSH 100 UNIT/ML IV SOLN
500.0000 [IU] | Freq: Once | INTRAVENOUS | Status: AC
  Administered 2022-11-07: 500 [IU]

## 2022-11-17 IMAGING — CT CT CHEST W/ CM
2 of 4 series · 14 of 36 positions shown, 17 images · IV contrast (APPLIED)
Comparison: Chest radiograph dated 05/10/2020

CLINICAL DATA: 59-year-old male with left tongue base mass.

EXAM:
CT CHEST WITH CONTRAST
TECHNIQUE: Multidetector CT imaging of the chest was performed during
intravenous contrast administration.
CONTRAST:  75mL OMNIPAQUE IOHEXOL 300 MG/ML  SOLN

[Series 3: chest w · axial · 0.98mm/px · z∈[+254,+464]mm · 11 of 125 slices shown, 14 images]
[im 10/125  mediastinal]
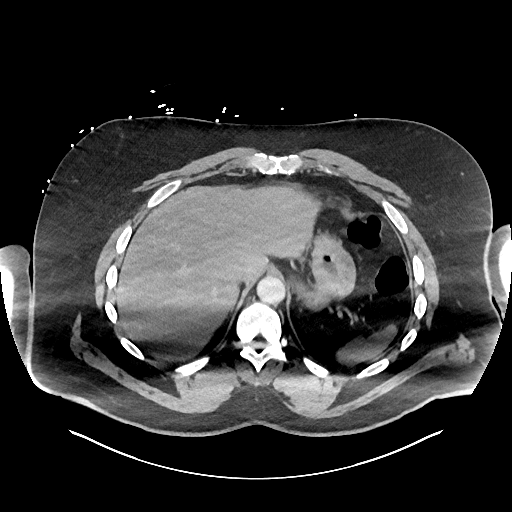
[im 10/125  lung]
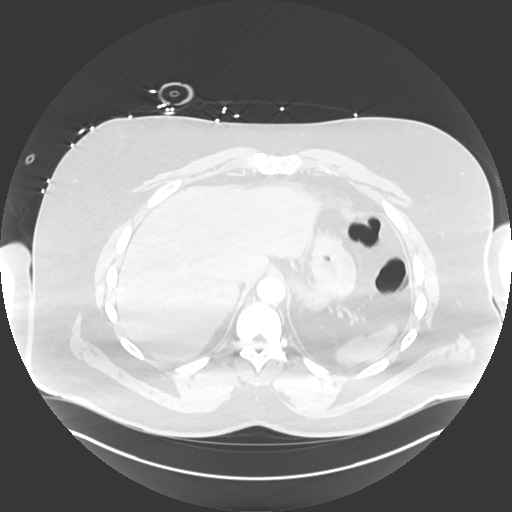
[im 20/125  lung]
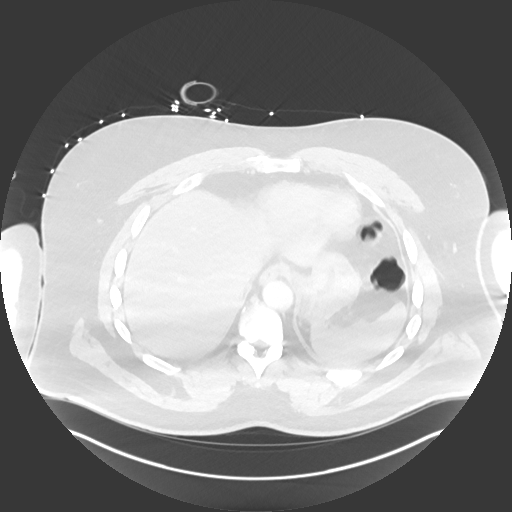
[im 29/125  lung]
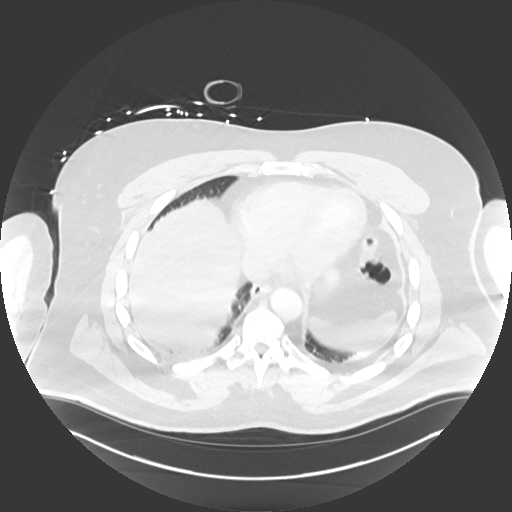
[im 39/125  lung]
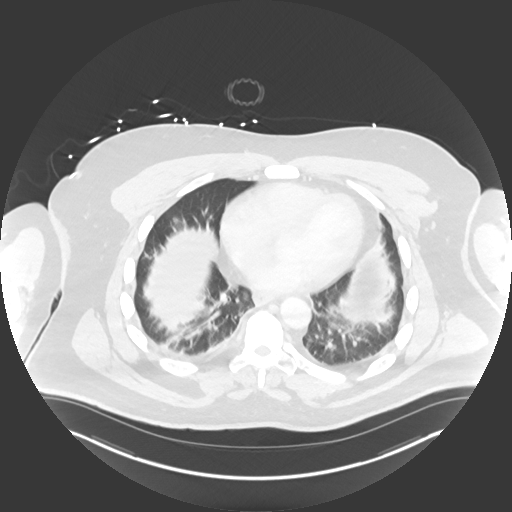
[im 48/125  mediastinal]
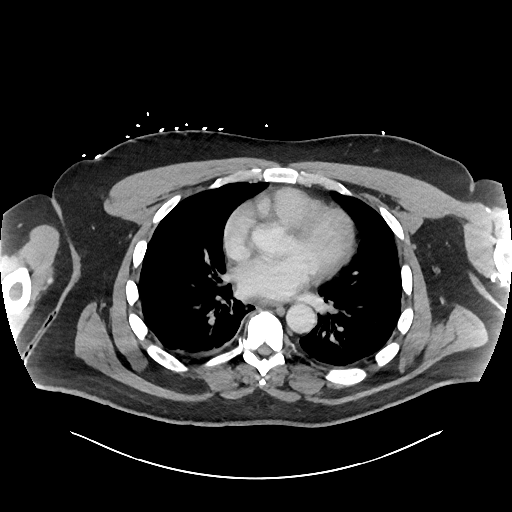
[im 48/125  lung]
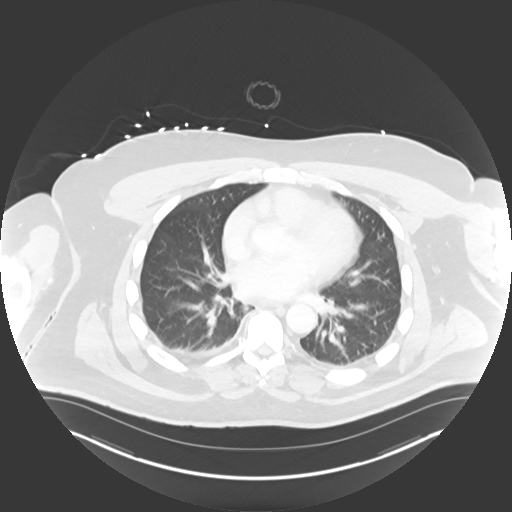
[im 67/125  lung]
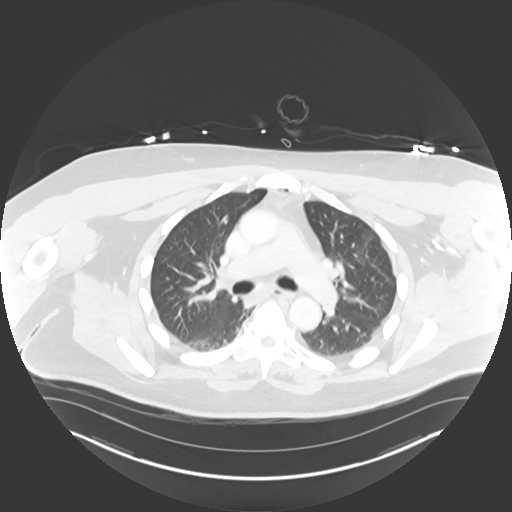
[im 77/125  lung]
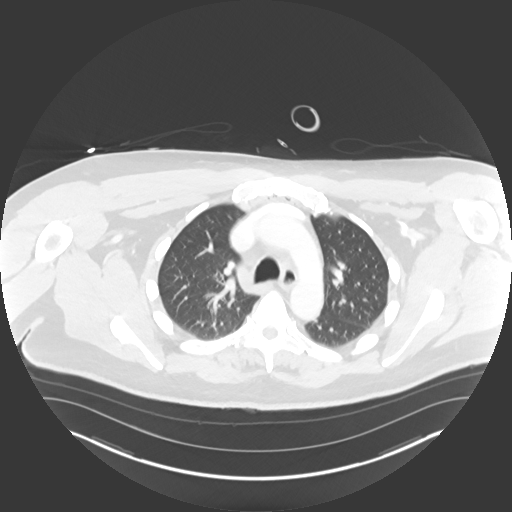
[im 86/125  lung]
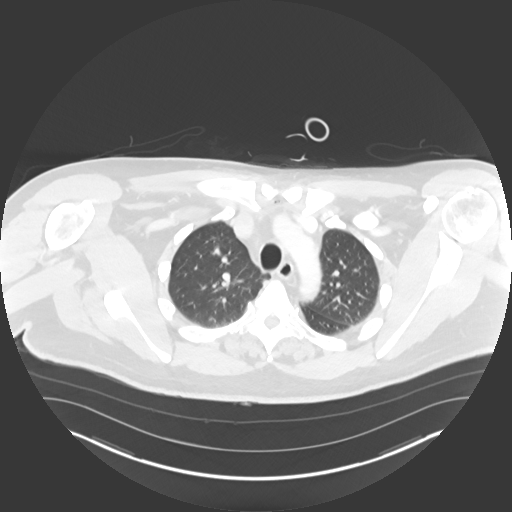
[im 96/125  mediastinal]
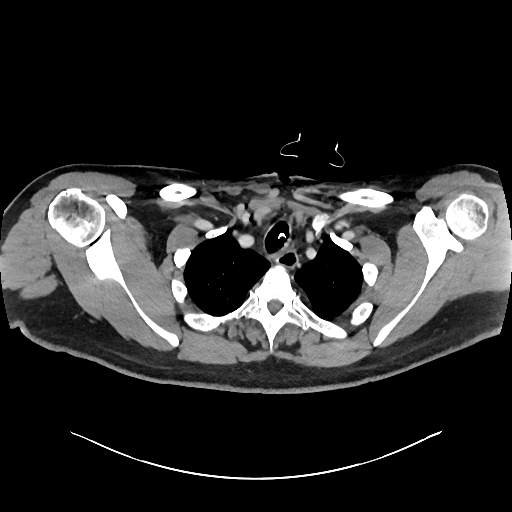
[im 96/125  lung]
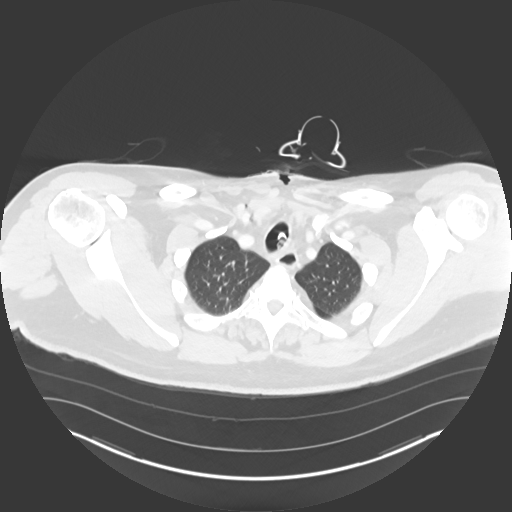
[im 105/125  lung]
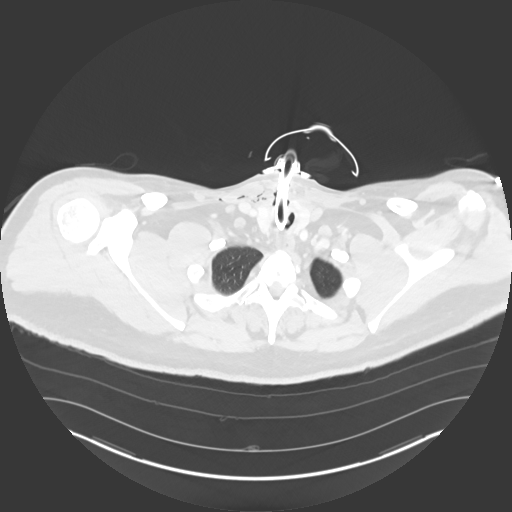
[im 115/125  lung]
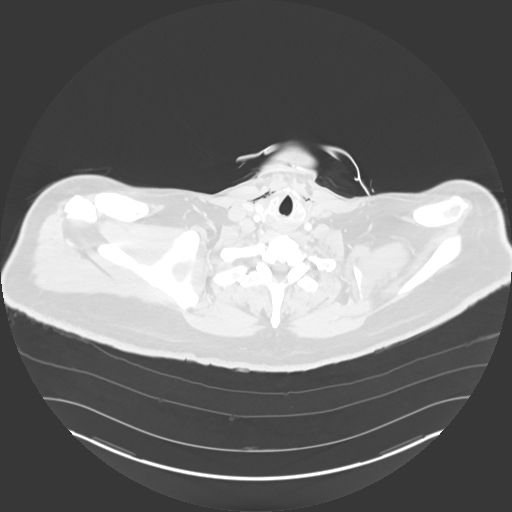

[Series 6: cor · coronal · 0.48mm/px · 3 of 163 slices shown]
[im 33/163  lung]
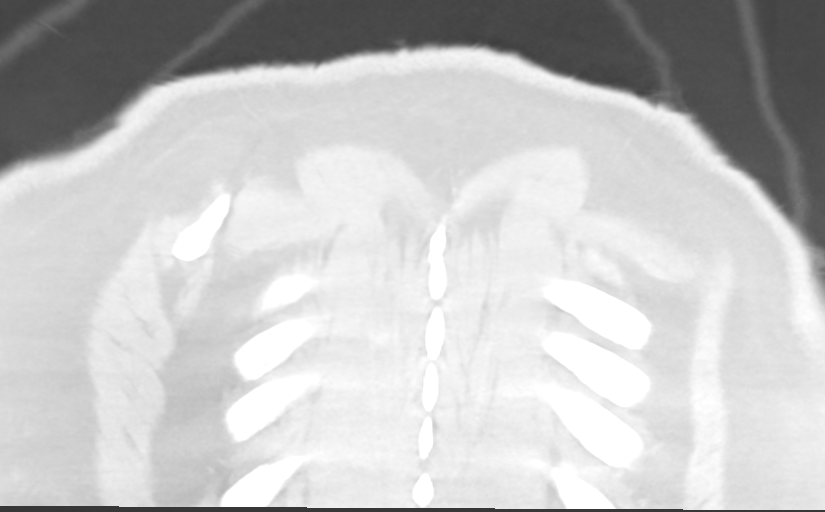
[im 65/163  lung]
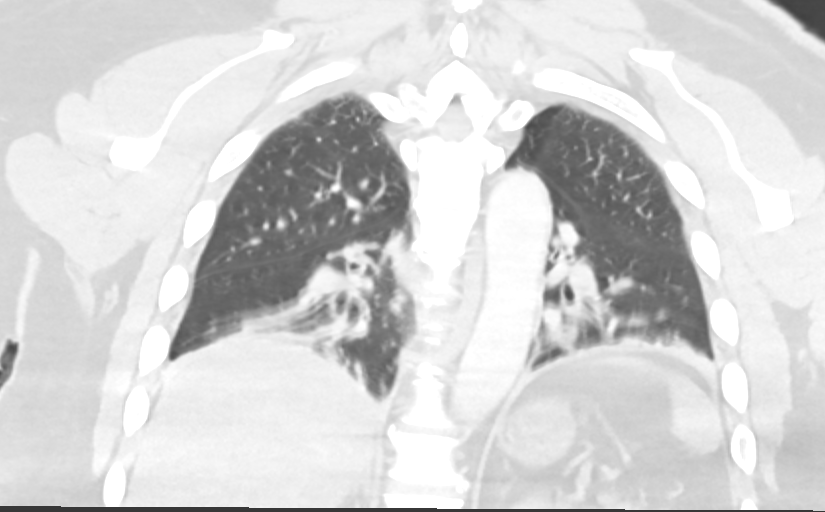
[im 98/163  lung]
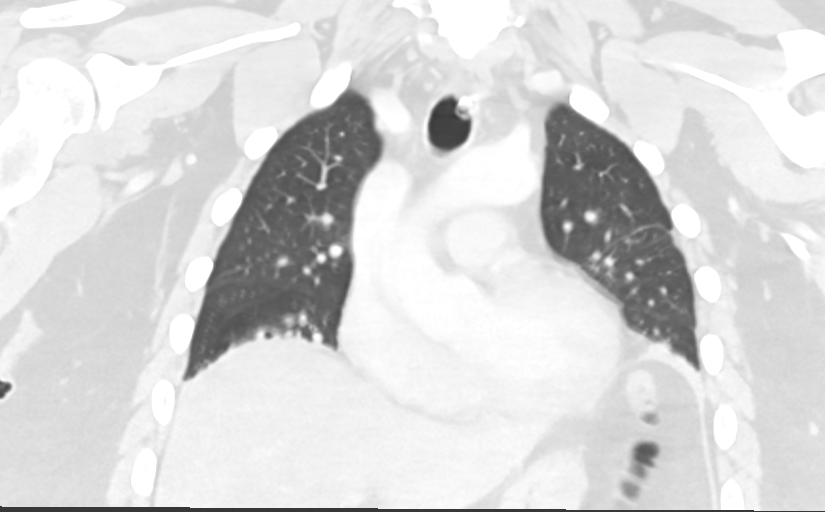

[14 of 36 positions shown; findings below may reference images not displayed]

FINDINGS: Cardiovascular: There is no cardiomegaly or pericardial effusion.
The thoracic aorta is unremarkable. The origins of the great vessels
of the aortic arch appear patent as visualized. The central
pulmonary arteries appear patent for the degree of opacification.

Mediastinum/Nodes: Subcarinal mass/adenopathy measures 2.3 cm in
short axis. No hilar adenopathy. The esophagus is grossly
unremarkable. No mediastinal fluid collection. Small amount of fluid
and air in the anterior mediastinum likely secondary to
tracheostomy.

Lungs/Pleura: There is shallow inspiration with bibasilar
atelectasis. No pleural effusion or pneumothorax. The central
airways are patent. A tracheostomy is noted with tip approximately
4.5 cm above the carina.

Upper Abdomen: Probable fatty liver.

Musculoskeletal: Degenerative changes of the spine. No acute osseous
pathology.
IMPRESSION: 1. No acute intrathoracic pathology.
2. Subcarinal mass/adenopathy.
3. Low lung volumes with bibasilar atelectasis.

## 2022-11-19 ENCOUNTER — Encounter (HOSPITAL_BASED_OUTPATIENT_CLINIC_OR_DEPARTMENT_OTHER): Payer: Self-pay | Admitting: Family Medicine

## 2022-11-19 ENCOUNTER — Other Ambulatory Visit (HOSPITAL_BASED_OUTPATIENT_CLINIC_OR_DEPARTMENT_OTHER): Payer: Self-pay

## 2022-11-19 ENCOUNTER — Ambulatory Visit (INDEPENDENT_AMBULATORY_CARE_PROVIDER_SITE_OTHER): Payer: PRIVATE HEALTH INSURANCE | Admitting: Family Medicine

## 2022-11-19 ENCOUNTER — Encounter: Payer: Self-pay | Admitting: Hematology and Oncology

## 2022-11-19 VITALS — BP 110/73 | HR 90 | Ht 75.0 in | Wt 282.4 lb

## 2022-11-19 DIAGNOSIS — Z0000 Encounter for general adult medical examination without abnormal findings: Secondary | ICD-10-CM

## 2022-11-19 DIAGNOSIS — R7301 Impaired fasting glucose: Secondary | ICD-10-CM | POA: Diagnosis not present

## 2022-11-19 DIAGNOSIS — Z1211 Encounter for screening for malignant neoplasm of colon: Secondary | ICD-10-CM

## 2022-11-19 MED ORDER — COMIRNATY 30 MCG/0.3ML IM SUSY
0.3000 mL | PREFILLED_SYRINGE | Freq: Once | INTRAMUSCULAR | 0 refills | Status: AC
  Filled 2022-11-19: qty 0.3, 1d supply, fill #0

## 2022-11-19 NOTE — Patient Instructions (Addendum)
Please get your COVID Booster downstairs in Pharmacy.   Discuss Prevnar 20 or Pneumovax 23 Vaccine with insurance

## 2022-11-19 NOTE — Progress Notes (Signed)
Subjective:   Alexander [name] February 20, 1960 11/19/2022  CC: Chief Complaint  Patient presents with   Annual Exam    Patient is here today for his physical.     HPI: Alexander [name] is a 62 y.o. male who presents for a routine health maintenance exam.  Labs collected at time of visit.   HEALTH SCREENINGS: - Vision Screening: up to date - Dental Visits:  Recommended Dental Specialist following ENT cancer; is currently looking for one.  - Testicular Exam: Declined - STD Screening: Declined - PSA (50+): Up to date   Lab Results  Component Value Date   PSA1 1.1 10/22/2022     - Colonoscopy (45+): Ordered today  Discussed with patient purpose of the colonoscopy is to detect colon cancer at curable precancerous or early stages  - AAA Screening: Not applicable  Men age 62-75 who have ever smoked - Lung Cancer screening with low-dose CT:  Previously performed CT Chest on 06/27/22 due to ENT Cancer Hx. He is scheduled for repeat CT Chest on 12/12/22 with Oncology management  -  Adults age 56-80 who are current cigarette smokers or quit within the last 15 years. Must have 20 pack year history.   Depression and Anxiety Screen done today and results listed below:     11/19/2022    1:09 PM 10/22/2022    1:17 PM  Depression screen PHQ 2/9  Decreased Interest 0 0  Down, Depressed, Hopeless 0 0  PHQ - 2 Score 0 0  Altered sleeping 0 0  Tired, decreased energy 0 0  Change in appetite 0 0  Feeling bad or failure about yourself  0 0  Trouble concentrating 0 0  Moving slowly or fidgety/restless 0 0  Suicidal thoughts 0 0  PHQ-9 Score 0 0  Difficult doing work/chores Not difficult at all Not difficult at all      11/19/2022    1:09 PM 10/22/2022    1:17 PM  GAD 7 : Generalized Anxiety Score  Nervous, Anxious, on Edge 0 0  Control/stop worrying 0 0  Worry too much - different things 0 0  Trouble relaxing 0 0  Restless 0 0  Easily annoyed or irritable 0 0  Afraid - awful might happen  0 0  Total GAD 7 Score 0 0  Anxiety Difficulty Not difficult at all Not difficult at all    IMMUNIZATIONS:  - Tdap: Tetanus vaccination status reviewed: Declined. - Influenza: Given elsewhere - Pneumovax:  Recommended given ENT Cancer hx. Pt will checked eligibility with Insurance and receive at pharmacy - Zostavax vaccine (50+): Up to date   Past medical history, surgical history, medications, allergies, family history and social history reviewed with patient today and changes made to appropriate areas of the chart.   Past Medical History:  Diagnosis Date   Asthma    Cancer (HCC)    GERD (gastroesophageal reflux disease)    Sleep apnea    CPAP- does not wear    Past Surgical History:  Procedure Laterality Date   BRONCHIAL NEEDLE ASPIRATION BIOPSY  05/22/2020   Procedure: BRONCHIAL NEEDLE ASPIRATION BIOPSIES;  Surgeon: Josephine Igo, DO;  Location: [facility];  Service: Pulmonary;;   DIRECT LARYNGOSCOPY  05/09/2020   Procedure: DIRECT LARYNGOSCOPY;  Surgeon: [name], MD;  Location: [facility] OR;  Service: ENT;;   IR GASTROSTOMY TUBE MOD SED  09/13/2020   IR GASTROSTOMY TUBE REMOVAL  12/14/2020   IR IMAGING GUIDED PORT INSERTION  06/12/2020  MASS EXCISION N/A 05/09/2020   Procedure: EXCISION TONGUE MASS;  Surgeon: [name], MD;  Location: [facility] OR;  Service: ENT;  Laterality: N/A;   TONSILLECTOMY     TRACHEOSTOMY TUBE PLACEMENT  05/09/2020   Procedure: TRACHEOSTOMY;  Surgeon: [name], MD;  Location: [facility] OR;  Service: ENT;;   VIDEO BRONCHOSCOPY WITH ENDOBRONCHIAL ULTRASOUND N/A 05/22/2020   Procedure: VIDEO BRONCHOSCOPY WITH ENDOBRONCHIAL ULTRASOUND;  Surgeon: Josephine Igo, DO;  Location: [facility];  Service: Pulmonary;  Laterality: N/A;    Current Outpatient Medications on File Prior to Visit  Medication Sig   albuterol (PROVENTIL) (2.5 MG/3ML) 0.083% nebulizer solution Take 3 mLs (2.5 mg total) by nebulization every 6 (six) hours as needed  for wheezing or shortness of breath.   albuterol (VENTOLIN HFA) 108 (90 Base) MCG/ACT inhaler Inhale 2 puffs into the lungs as needed for wheezing or shortness of breath.   cetirizine (ZYRTEC) 10 MG tablet Take 10 mg by mouth daily.   cyclobenzaprine (FLEXERIL) 10 MG tablet Take 1 tablet (10 mg total) by mouth 2 (two) times daily as needed for muscle spasms.   fluticasone (FLONASE) 50 MCG/ACT nasal spray Place 2 sprays into both nostrils daily. Place 1 spray each nostril at night   Fluticasone-Umeclidin-Vilant (TRELEGY ELLIPTA) 200-62.5-25 MCG/ACT AEPB Inhale 1 puff into the lungs daily at 2 am.   gabapentin (NEURONTIN) 300 MG capsule Take 1 capsule (300 mg total) by mouth at bedtime.   omeprazole (PRILOSEC) 40 MG capsule Take 40 mg by mouth daily.   Current Facility-Administered Medications on File Prior to Visit  Medication   magnesium sulfate 2 GM/50ML IVPB    Allergies  Allergen Reactions   Tessalon [Benzonatate]      Social History   Socioeconomic History   Marital status: Single    Spouse name: Not on file   Number of children: Not on file   Years of education: Not on file   Highest education level: Not on file  Occupational History   Not on file  Tobacco Use   Smoking status: Never    Passive exposure: Past   Smokeless tobacco: Never  Vaping Use   Vaping status: Never Used  Substance and Sexual Activity   Alcohol use: Not Currently    Comment: "4-5 drinks a year for special ocassions" 05/08/20   Drug use: Never   Sexual activity: Not Currently  Other Topics Concern   Not on file  Social History Narrative   Not on file   Social Determinants of Health   Financial Resource Strain: Low Risk  (06/05/2020)   Overall Financial Resource Strain (CARDIA)    Difficulty of Paying Living Expenses: Not hard at all  Food Insecurity: No Food Insecurity (06/05/2020)   Hunger Vital Sign    Worried About Running Out of Food in the Last Year: Never true    Ran Out of Food in the  Last Year: Never true  Transportation Needs: No Transportation Needs (06/05/2020)   PRAPARE - Administrator, Civil Service (Medical): No    Lack of Transportation (Non-Medical): No  Physical Activity: Not on file  Stress: No Stress Concern Present (06/05/2020)   [employer] of Occupational Health - Occupational Stress Questionnaire    Feeling of Stress : Not at all  Social Connections: Socially Isolated (06/05/2020)   Social Connection and Isolation Panel [NHANES]    Frequency of Communication with Friends and Family: More than three times a week    Frequency  of Social Gatherings with Friends and Family: More than three times a week    Attends Religious Services: Never    Database administrator or Organizations: No    Attends Banker Meetings: Never    Marital Status: Separated  Intimate Partner Violence: Not At Risk (11/19/2022)   Humiliation, Afraid, Rape, and Kick questionnaire    Fear of Current or Ex-Partner: No    Emotionally Abused: No    Physically Abused: No    Sexually Abused: No   Social History   Tobacco Use  Smoking Status Never   Passive exposure: Past  Smokeless Tobacco Never   Social History   Substance and Sexual Activity  Alcohol Use Not Currently   Comment: "4-5 drinks a year for special ocassions" 05/08/20     Family History  Problem Relation Age of Onset   Breast cancer Mother    Cancer - Cervical Mother    Colon cancer Father    Colon cancer Brother      ROS: Denies fever, fatigue, unexplained weight loss/gain, CP, SHOB, and palpatitations. Denies neurological deficits, gastrointestinal and/or genitourinary complaints, and skin changes.   Objective:   Today's Vitals   11/19/22 1308  BP: 110/73  Pulse: 90  SpO2: 98%  Weight: 282 lb 6.4 oz (128.1 kg)  Height: 6\' 3"  (1.905 m)    GENERAL APPEARANCE: Well-appearing, in NAD. Well nourished.  SKIN: Pink, warm and dry. Turgor normal. No rash, lesion, ulceration, or  ecchymoses. Hair evenly distributed.  HEENT: HEAD: Normocephalic.  EYES: PERRLA. EOMI. Lids intact w/o defect. Sclera white, Conjunctiva pink w/o exudate.  EARS: External ear w/o redness, swelling, masses or lesions. EAC clear. TM's intact, translucent w/o bulging, appropriate landmarks visualized. Appropriate acuity to conversational tones.  NOSE: Septum midline w/o deformity. Nares patent, mucosa pink and non-inflamed w/o drainage. No sinus tenderness.  THROAT: Uvula midline. Oropharynx clear. Tonsils non-inflamed w/o exudate. Oral mucosa pink and moist.  NECK: Supple, Trachea midline. Full ROM w/o pain or tenderness. No lymphadenopathy. Thyroid non-tender w/o enlargement or palpable masses.  RESPIRATORY: Chest wall symmetrical w/o masses. Respirations even and non-labored. Breath sounds clear to auscultation bilaterally. No wheezes, rales, rhonchi, or crackles. CARDIAC: S1, S2 present, regular rate and rhythm. No gallops, murmurs, rubs, or clicks. PMI w/o lifts, heaves, or thrills. No carotid bruits. Capillary refill <2 seconds. Peripheral pulses 2+ bilaterally. GI: Abdomen soft w/o distention. Normoactive bowel sounds. No palpable masses or tenderness. No guarding or rebound tenderness. Liver and spleen w/o tenderness or enlargement. No CVA tenderness.  GU: Pt deferred exam. MSK: Muscle tone and strength appropriate for age, w/o atrophy or abnormal movement. EXTREMITIES: Active ROM intact, w/o tenderness, crepitus, or contracture. No obvious joint deformities or effusions. No clubbing, edema, or cyanosis.  NEUROLOGIC: CN's II-XII intact. Motor strength symmetrical with no obvious weakness. No sensory deficits. DTR 2+ symmetric bilaterally. Steady, even gait.  PSYCH/MENTAL STATUS: Alert, oriented x 3. Cooperative, appropriate mood and affect.   Results for orders placed or performed in visit on 10/23/22  Lab report - scanned  Result Value Ref Range   A1c 5.6    PSA, Total 1.15    EGFR 99.8    [facility] COLONOSCOPY  Result Value Ref Range   [facility] Colonoscopy See Report (in chart) See Report (in chart), Patient Reported   Lab Results  Component Value Date   WBC 6.8 10/22/2022   HGB 14.7 10/22/2022   HCT 42.7 10/22/2022   MCV 93 10/22/2022   PLT  197 10/22/2022   CMP     Component Value Date/Time   NA 142 10/22/2022 1355   K 4.4 10/22/2022 1355   CL 105 10/22/2022 1355   CO2 23 10/22/2022 1355   GLUCOSE 88 10/22/2022 1355   GLUCOSE 116 (H) 05/30/2022 1253   BUN 14 10/22/2022 1355   CREATININE 1.17 10/22/2022 1355   CREATININE 1.33 (H) 05/30/2022 1253   CALCIUM 9.9 10/22/2022 1355   PROT 6.7 10/22/2022 1355   ALBUMIN 4.3 10/22/2022 1355   AST 14 10/22/2022 1355   AST 14 (L) 05/30/2022 1253   ALT 13 10/22/2022 1355   ALT 12 05/30/2022 1253   ALKPHOS 58 10/22/2022 1355   BILITOT 0.5 10/22/2022 1355   BILITOT 0.8 05/30/2022 1253   EGFR 70 10/22/2022 1355   GFRNONAA >60 05/30/2022 1253   Lab Results  Component Value Date   CHOL 152 10/22/2022   HDL 82 10/22/2022   LDLCALC 52 10/22/2022   TRIG 98 10/22/2022   CHOLHDL 1.9 10/22/2022   Lab Results  Component Value Date   PSA1 1.1 10/22/2022    Lab Results  Component Value Date   TSH 2.410 10/22/2022   Lab Results  Component Value Date   HGBA1C 5.8 (H) 10/22/2022    Assessment & Plan:  1. Annual physical exam Alexander [name] is doing well overall. He is managed by Dr. Al Pimple with medical oncology for ENT cancer surveillance. Will discuss possible port a cath removal in October with repeat scans scheduled. He is managed by [facility] for cancer management. He received his flu shot in the past 1-2 weeks and will receive his COVID booster today in pharmacy at [facility]. Discussed patient's labs in depth and reviewed his preventative screenings.   2. Screening for colon cancer Referral placed to [facility].  Patient inquired for Dr. Al Pimple to order as oncologist for [facility] but per Iruku MD this only  covers oncology medical management. Last Cscope per patient was 01/18/18 with 3 year follow up recommended. He will bring records to office to be scanned.  - Ambulatory referral to Gastroenterology  3. IFG (impaired fasting glucose) A1C slightly elevated at 5.8. Recommend dietary changes and regular exercise. Monitor sugar and carb intake. Pt verbalized understanding.    No orders of the defined types were placed in this encounter.   PATIENT COUNSELING: - Encouraged to adjust caloric intake to maintain or achieve ideal body weight, to reduce intake of dietary saturated fat and total fat, to limit sodium intake by avoiding high sodium foods and not adding table salt, and to maintain adequate dietary potassium and calcium preferably from fresh fruits, vegetables, and low-fat dairy products.   - Advised to avoid cigarette smoking. - Discussed with the patient that most people either abstain from alcohol or drink within safe limits (<=14/week and <=4 drinks/occasion for males, <=7/weeks and <= 3 drinks/occasion for females) and that the risk for alcohol disorders and other health effects rises proportionally with the number of drinks per week and how often a drinker exceeds daily limits. - Discussed cessation/primary prevention of drug use and availability of treatment for abuse.   - Stressed the importance of regular exercise - Injury prevention: Discussed safety belts, safety helmets, smoke detector, smoking near bedding or upholstery.  - Dental health: Discussed importance of regular tooth brushing, flossing, and dental visits.  - Sexuality: Discussed sexually transmitted diseases, partner selection, use of condoms, avoidance of unintended pregnancy  and contraceptive alternatives.   NEXT  PREVENTATIVE PHYSICAL DUE IN 1 YEAR.  No follow-ups on file.  Patient to reach out to office if new, worrisome, or unresolved symptoms arise or if no improvement in patient's condition. Patient verbalized  understanding and is agreeable to treatment plan. All questions answered to patient's satisfaction.   Of note, portions of this note may have been created with voice recognition software Physicist, medical). While this note has been edited for accuracy, occasional wrong-word or 'sound-a-like' substitutions may have occurred due to the inherent limitations of voice recognition software.   Yolanda Manges, FNP

## 2022-12-02 NOTE — Progress Notes (Signed)
Alexander [name] presents today for follow-up after completing radiation to his base of tongue and subcarinal node on 11/07/2020 and to review CT results with this visit.    CT Soft Tissue Neck W Contrast 12/12/2022  IMPRESSION: Stable post treatment neck.  No evidence of recurrent disease.   CT Chest W Contrast 12/12/2022  IMPRESSION: No evidence of progressive metastatic disease in the chest.     Pain issues, if any: denies pain  Using a feeding tube?: no feeding tube Weight changes, if any:  Wt Readings from Last 3 Encounters:  12/16/22 285 lb 6 oz (129.4 kg)  11/19/22 282 lb 6.4 oz (128.1 kg)  10/22/22 280 lb (127 kg)   Swallowing issues, if any: sometimes is a challenge, feels like food/liquid go down his wind pipe Smoking or chewing tobacco? Does not use Using fluoride toothpaste daily? None  Last ENT visit was on: Dr. [name] on 06-19-22, with scope performed Other notable issues, if any: Pt doing well overall. He is having balance issues over the past 4 months. Pt feels like right side of tongue are swollen (had this feeling since treatment).   Vitals:   12/16/22 1452  Pulse: 86  Resp: 18  Temp: 97.7 F (36.5 C)  SpO2: 98%     Dr. [name] on 06-19-22 HPI:   Follow-up (6 month follow up )  Alexander [name] is a 62 y.o. 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 Patient completed definitive chemoradiation to his base of tongue and subcarinal lymph node in September 2022. He is scheduled for a repeat CT in approximately 1 to 2 weeks. He states that he is scheduled to see Dr. Basilio Cairo next week, and was seen by Dr. Al Pimple on 05/30/2022. He reports that since last visit with me in October, he has done well. He just recently got re-married, and is here today with his wife. He states that he is tolerating a regular diet without difficulty, and has been  gradually gaining weight. He denies intraoral pain, bleeding, dysphagia, odynophagia. He was recently seen at [facility] ED at the end of April for an allergic reaction. He states that his symptoms promptly responded to medications administered to him by the ED provider.   rocedure: After adequate topical anesthetic was applied, 4 mm flexible laryngoscope was passed through the nasal cavity without difficulty. Flexible laryngoscopy shows patent anterior nasal cavity with minimal crusting, no discharge or infection. Copious clear rhinorrhea noted, with some crusting and scabbing along the base of tongue on the left, with persistent asymmetric mucosal edema. No fulminant lesion or mass appreciated. Normal base of tongue and supraglottis Normal vocal cord mobility without vocal cord nodule, mass, polyp or tumor. Hypopharynx normal without mass, pooling of secretions or aspiration.  Patient tolerated procedure without complication or difficulty.  Impression & Plans:  Alexander [name] is a 62 y.o. 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 Complete head neck examination today including nasal laryngoscopy demonstrates significant postnasal drainage as well as persistent mucosal edema of the left glossotonsillar sulcus with no obvious mass lesion. Patient is scheduled for a repeat CT neck with contrast next week. We discussed that this  will be very helpful in further determining this mucosal asymmetry, which was also noted at time of last nasolaryngoscopy in October. Patient will be scheduled with me for a follow-up in approximately 6 months, however if tissue sampling is advisable based on results of CT scan, I will reach out to patient.  Alexander Maretta Los,  DO Otolaryngology

## 2022-12-11 ENCOUNTER — Telehealth (HOSPITAL_BASED_OUTPATIENT_CLINIC_OR_DEPARTMENT_OTHER): Payer: Self-pay | Admitting: *Deleted

## 2022-12-11 NOTE — Telephone Encounter (Signed)
LVM to schedule medicare welllness visit

## 2022-12-12 ENCOUNTER — Ambulatory Visit (HOSPITAL_COMMUNITY)
Admission: RE | Admit: 2022-12-12 | Discharge: 2022-12-12 | Disposition: A | Discharge status: Home / Self Care | Payer: 59 | Source: Ambulatory Visit | Attending: Radiation Oncology | Admitting: Radiation Oncology

## 2022-12-12 DIAGNOSIS — C01 Malignant neoplasm of base of tongue: Secondary | ICD-10-CM | POA: Insufficient documentation

## 2022-12-12 MED ORDER — HEPARIN SOD (PORK) LOCK FLUSH 100 UNIT/ML IV SOLN
500.0000 [IU] | Freq: Once | INTRAVENOUS | Status: AC
  Administered 2022-12-12: 500 [IU] via INTRAVENOUS

## 2022-12-12 MED ORDER — IOHEXOL 300 MG/ML  SOLN
75.0000 mL | Freq: Once | INTRAMUSCULAR | Status: AC | PRN
  Administered 2022-12-12: 75 mL via INTRAVENOUS

## 2022-12-16 ENCOUNTER — Ambulatory Visit
Admission: RE | Admit: 2022-12-16 | Discharge: 2022-12-16 | Disposition: A | Discharge status: Home / Self Care | Payer: 59 | Source: Ambulatory Visit | Attending: Radiation Oncology | Admitting: Radiation Oncology

## 2022-12-16 ENCOUNTER — Other Ambulatory Visit: Payer: Self-pay

## 2022-12-16 ENCOUNTER — Encounter: Payer: Self-pay | Admitting: Radiation Oncology

## 2022-12-16 VITALS — HR 86 | Temp 97.7°F | Resp 18 | Ht 75.0 in | Wt 285.4 lb

## 2022-12-16 DIAGNOSIS — R682 Dry mouth, unspecified: Secondary | ICD-10-CM | POA: Diagnosis not present

## 2022-12-16 DIAGNOSIS — C01 Malignant neoplasm of base of tongue: Secondary | ICD-10-CM

## 2022-12-16 DIAGNOSIS — G620 Drug-induced polyneuropathy: Secondary | ICD-10-CM

## 2022-12-16 DIAGNOSIS — Z923 Personal history of irradiation: Secondary | ICD-10-CM | POA: Insufficient documentation

## 2022-12-16 DIAGNOSIS — Z85810 Personal history of malignant neoplasm of tongue: Secondary | ICD-10-CM | POA: Insufficient documentation

## 2022-12-16 DIAGNOSIS — Z79899 Other long term (current) drug therapy: Secondary | ICD-10-CM | POA: Diagnosis not present

## 2022-12-16 NOTE — Progress Notes (Signed)
Radiation Oncology         [phone] ________________________________  Name: Alexander [name] MRN: [id number]  Date: 12/16/2022  DOB: May 06, 1960  Follow-Up Visit Note  Outpatient  CC: de [place], [name], MD  [name], *  Diagnosis and Prior Radiotherapy: C01   ICD-10-CM   1. 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) Staging form: Pharynx - HPV-Mediated Oropharynx, AJCC 8th Edition - Clinical stage from 05/18/2020: Stage IV (cT4, cN3, cM1, p16+) - Signed by [name], MD on 12/10/2020 - Pathologic stage from 06/01/2020: No Stage Recommended (cT4, cN3, cM1, p16+) - Signed by [name], MD on 06/01/2020 Stage prefix: Initial diagnosis    Radiation Treatment Dates: 09/19/2020 through 11/07/2020 Site Technique Total Dose (Gy) Dose per Fx (Gy) Completed Fx Beam Energies  Neck: HN_BOT IMRT 70/70 2 35/35 6X  Mediastinum: Chest_subcari IMRT 66/66 2 33/33 6X   CHIEF COMPLAINT: Here for follow-up and surveillance of throat cancer  Narrative:    [name] presents today for follow-up after completing radiation to his base of tongue and subcarinal node on 11/07/2020 and to review CT scan from 12/12/2022.   Most recent CT scan from 12/12/2022 demonstrates stable post treatment changes with no evidence of recurrent disease in the neck or chest.   Pain issues, if any: denies pain  Using a feeding tube?: no feeding tube Weight changes, if any:     Wt Readings from Last 3 Encounters:  12/16/22 285 lb 6 oz (129.4 kg)  11/19/22 282 lb 6.4 oz (128.1 kg)  10/22/22 280 lb (127 kg)    Swallowing issues, if any: sometimes is a challenge, feels like food/liquid go down his wind pipe Smoking or chewing tobacco? Does not use Using fluoride toothpaste daily? None  Last ENT visit was on: Dr. [name] on 06-19-22, with scope performed Other notable issues, if any: Pt doing well overall. He is  having balance issues over the past 4 months. Pt feels like right side of tongue are swollen (had this feeling since treatment).       Vitals:    12/16/22 1452  Pulse: 86  Resp: 18  Temp: 97.7 F (36.5 C)  SpO2: 98%   Patient saw Dr. [name] on 06/19/2022. She performed a laryngoscope which showed no evidence of disease recurrence. She is scheduled to see him in mid-November.    ALLERGIES:  is allergic to tessalon [benzonatate].  Meds: Current Outpatient Medications  Medication Sig Dispense Refill   albuterol (PROVENTIL) (2.5 MG/3ML) 0.083% nebulizer solution Take 3 mLs (2.5 mg total) by nebulization every 6 (six) hours as needed for wheezing or shortness of breath. 120 mL 12   albuterol (VENTOLIN HFA) 108 (90 Base) MCG/ACT inhaler Inhale 2 puffs into the lungs as needed for wheezing or shortness of breath.     cetirizine (ZYRTEC) 10 MG tablet Take 10 mg by mouth daily.     cyclobenzaprine (FLEXERIL) 10 MG tablet Take 1 tablet (10 mg total) by mouth 2 (two) times daily as needed for muscle spasms. 30 tablet 2   fluticasone (FLONASE) 50 MCG/ACT nasal spray Place 2 sprays into both nostrils daily. Place 1 spray each nostril at night 16 g 11   Fluticasone-Umeclidin-Vilant (TRELEGY ELLIPTA) 200-62.5-25 MCG/ACT AEPB Inhale 1 puff into the lungs daily at 2 am. 90 each 3   gabapentin (NEURONTIN) 300 MG capsule Take 1  capsule (300 mg total) by mouth at bedtime. 30 capsule 1   omeprazole (PRILOSEC) 40 MG capsule Take 40 mg by mouth daily.     No current facility-administered medications for this encounter.   Facility-Administered Medications Ordered in Other Encounters  Medication Dose Route Frequency Provider Last Rate Last Admin   magnesium sulfate 2 GM/50ML IVPB             Physical Findings: *** The patient is in no acute distress. Patient is alert and oriented.  height is 6\' 3"  (1.905 m) and weight is 285 lb 6 oz (129.4 kg). His temporal temperature is 97.7 F (36.5 C). His pulse is  86. His respiration is 18 and oxygen saturation is 98%. [name]    HEENT: Oropharynx is clear from any masses or lesions.  EOMI Skin: Well healed skin in radiation treatment fields.  NECK: No palpable masses in the cervical or supraclavicular neck.   Lymphedema has improved greatly and is mild Heart: RRR, no edema Chest: CTAB - PAC in upper right chest Ext: no edema Abd: soft , NT/ ND  Lab Findings: Lab Results  Component Value Date   WBC 6.8 10/22/2022   HGB 14.7 10/22/2022   HCT 42.7 10/22/2022   MCV 93 10/22/2022   PLT 197 10/22/2022    Radiographic Findings: CT Chest W Contrast  Result Date: 12/15/2022 CLINICAL DATA:  Squamous cell carcinoma of the tongue; * Tracking Code: BO * EXAM: CT CHEST WITH CONTRAST TECHNIQUE: Multidetector CT imaging of the chest was performed during intravenous contrast administration. RADIATION DOSE REDUCTION: This exam was performed according to the departmental dose-optimization program which includes automated exposure control, adjustment of the mA and/or kV according to patient size and/or use of iterative reconstruction technique. CONTRAST:  75mL OMNIPAQUE IOHEXOL 300 MG/ML  SOLN COMPARISON:  Multiple priors, most recent chest CT dated Jun 27, 2022 FINDINGS: Cardiovascular: Normal heart size. No No pericardial effusion. Normal caliber thoracic aorta no significant atherosclerotic disease. Right chest wall port with tip in the upper right atrium. Mediastinum/Nodes: Esophagus and thyroid are unremarkable. Stable posttreatment appearance of subcarinal soft tissue. No enlarged lymph nodes seen in the chest. Lungs/Pleura: Central airways are patent. No suspicious pulmonary nodules. No consolidation, pleural effusion or pneumothorax. Upper Abdomen: No acute abnormality. Musculoskeletal: No chest wall abnormality. No acute or significant osseous findings. IMPRESSION: No evidence of progressive metastatic disease in the chest. Electronically Signed   By: [name]  M.D.   On: 12/15/2022 09:29   CT Soft Tissue Neck W Contrast  Result Date: 12/15/2022 CLINICAL DATA:  Squamous cell carcinoma of the tongue, monitoring. EXAM: CT NECK WITH CONTRAST TECHNIQUE: Multidetector CT imaging of the neck was performed using the standard protocol following the bolus administration of intravenous contrast. RADIATION DOSE REDUCTION: This exam was performed according to the departmental dose-optimization program which includes automated exposure control, adjustment of the mA and/or kV according to patient size and/or use of iterative reconstruction technique. CONTRAST:  75mL OMNIPAQUE IOHEXOL 300 MG/ML  SOLN COMPARISON:  06/27/2022 FINDINGS: Pharynx and larynx: Unchanged architectural distortion of the oropharynx with symmetric epiglottic thickening that is considered treatment related. No recurrent or metachronous mass is noted. Evidence of prior tracheostomy. Salivary glands: Post treatment atrophy especially of the submandibular glands. Thyroid: Normal Lymph nodes: None enlarged or abnormal density. Vascular: Porta catheter on the right. Limited intracranial: Negative Visualized orbits: Negative Mastoids and visualized paranasal sinuses: Chronic generalized sinus opacification with patchy involvement especially of the ethmoids. Negative orbits. Skeleton:  No acute finding Upper chest: Reported separately IMPRESSION: Stable post treatment neck.  No evidence of recurrent disease. Electronically Signed   By: Tiburcio Pea M.D.   On: 12/15/2022 09:16    Impression/Plan:   1) Head and Neck Cancer Status: in remission -  no evidence of disease progression or recurrence on recent imaging or physical exam today two years post therapy. We personally reviewed his images together.   2) Nutritional Status: no active issues, PEG removed, pt is pleased with weight and eating.    3) Swallowing: Denies - dysphagia with solids and liquids. *** email [employer] (eval for further issues), carl  ([employer]), and jennifer malmfalt   4) Dental: Still having dry mouth. Encouraged to continue regular followup with dentistry, and dental hygiene including fluoride rinses.  Was a patient of Dr. Frederik Pear, but has not seen a dentist since he retired. Recommend he finds a new dentist and to let them know his history of radiation.  Recommended fluoride supplements, ie ACT mouthwash at bedtime ***  5) Thyroid function - check annually. Encouraged him to find a PCP and to have them follow up with TSH annually.  Lab Results  Component Value Date   TSH 2.410 10/22/2022   6) Scheduled to see Dr. Al Pimple on 12/18/22. He will continue follow up with medical oncology and Dr. [name]. ***  Email [facility] - scan annually ***   Follow up with radiation oncology PRN. We appreciate the opportunity to take part in this patient's care. He knows to call back with any questions or concerns that arise.   On date of service, in total, I spent 25 minutes on this encounter. Patient was seen in person.  _____________________________________   Joyice Faster, PA-C   [name], MD

## 2022-12-17 ENCOUNTER — Encounter (HOSPITAL_BASED_OUTPATIENT_CLINIC_OR_DEPARTMENT_OTHER): Payer: Self-pay | Admitting: Family Medicine

## 2022-12-18 ENCOUNTER — Other Ambulatory Visit: Payer: Self-pay

## 2022-12-18 ENCOUNTER — Telehealth (INDEPENDENT_AMBULATORY_CARE_PROVIDER_SITE_OTHER): Payer: Self-pay | Admitting: Otolaryngology

## 2022-12-18 ENCOUNTER — Inpatient Hospital Stay: Payer: 59 | Attending: Hematology and Oncology

## 2022-12-18 ENCOUNTER — Inpatient Hospital Stay (HOSPITAL_BASED_OUTPATIENT_CLINIC_OR_DEPARTMENT_OTHER): Payer: 59 | Admitting: Hematology and Oncology

## 2022-12-18 VITALS — BP 103/58 | HR 100 | Temp 97.9°F | Resp 18 | Wt 286.2 lb

## 2022-12-18 DIAGNOSIS — Z85810 Personal history of malignant neoplasm of tongue: Secondary | ICD-10-CM | POA: Diagnosis present

## 2022-12-18 DIAGNOSIS — Z79899 Other long term (current) drug therapy: Secondary | ICD-10-CM | POA: Diagnosis not present

## 2022-12-18 DIAGNOSIS — Z95828 Presence of other vascular implants and grafts: Secondary | ICD-10-CM

## 2022-12-18 DIAGNOSIS — G629 Polyneuropathy, unspecified: Secondary | ICD-10-CM | POA: Diagnosis not present

## 2022-12-18 DIAGNOSIS — C01 Malignant neoplasm of base of tongue: Secondary | ICD-10-CM

## 2022-12-18 DIAGNOSIS — R1310 Dysphagia, unspecified: Secondary | ICD-10-CM | POA: Diagnosis not present

## 2022-12-18 DIAGNOSIS — Z9221 Personal history of antineoplastic chemotherapy: Secondary | ICD-10-CM | POA: Insufficient documentation

## 2022-12-18 DIAGNOSIS — J45909 Unspecified asthma, uncomplicated: Secondary | ICD-10-CM | POA: Diagnosis not present

## 2022-12-18 DIAGNOSIS — Z923 Personal history of irradiation: Secondary | ICD-10-CM | POA: Insufficient documentation

## 2022-12-18 LAB — CBC WITH DIFFERENTIAL (CANCER CENTER ONLY)
Abs Immature Granulocytes: 0.02 10*3/uL (ref 0.00–0.07)
Basophils Absolute: 0.1 10*3/uL (ref 0.0–0.1)
Basophils Relative: 1 %
Eosinophils Absolute: 0.3 10*3/uL (ref 0.0–0.5)
Eosinophils Relative: 5 %
HCT: 41.3 % (ref 39.0–52.0)
Hemoglobin: 14 g/dL (ref 13.0–17.0)
Immature Granulocytes: 0 %
Lymphocytes Relative: 18 %
Lymphs Abs: 1.1 10*3/uL (ref 0.7–4.0)
MCH: 32.1 pg (ref 26.0–34.0)
MCHC: 33.9 g/dL (ref 30.0–36.0)
MCV: 94.7 fL (ref 80.0–100.0)
Monocytes Absolute: 0.5 10*3/uL (ref 0.1–1.0)
Monocytes Relative: 8 %
Neutro Abs: 4.3 10*3/uL (ref 1.7–7.7)
Neutrophils Relative %: 68 %
Platelet Count: 139 10*3/uL — ABNORMAL LOW (ref 150–400)
RBC: 4.36 MIL/uL (ref 4.22–5.81)
RDW: 14.6 % (ref 11.5–15.5)
WBC Count: 6.3 10*3/uL (ref 4.0–10.5)
nRBC: 0 % (ref 0.0–0.2)

## 2022-12-18 LAB — CMP (CANCER CENTER ONLY)
ALT: 12 U/L (ref 0–44)
AST: 15 U/L (ref 15–41)
Albumin: 4 g/dL (ref 3.5–5.0)
Alkaline Phosphatase: 46 U/L (ref 38–126)
Anion gap: 6 (ref 5–15)
BUN: 15 mg/dL (ref 8–23)
CO2: 28 mmol/L (ref 22–32)
Calcium: 9.1 mg/dL (ref 8.9–10.3)
Chloride: 106 mmol/L (ref 98–111)
Creatinine: 1.33 mg/dL — ABNORMAL HIGH (ref 0.61–1.24)
GFR, Estimated: >60 mL/min (ref >60)
Glucose, Bld: 115 mg/dL — ABNORMAL HIGH (ref 70–99)
Potassium: 3.8 mmol/L (ref 3.5–5.1)
Sodium: 140 mmol/L (ref 135–145)
Total Bilirubin: 0.9 mg/dL (ref 0.3–1.2)
Total Protein: 6.7 g/dL (ref 6.5–8.1)

## 2022-12-18 MED ORDER — SODIUM CHLORIDE 0.9% FLUSH
10.0000 mL | Freq: Once | INTRAVENOUS | Status: AC
  Administered 2022-12-18: 10 mL

## 2022-12-18 MED ORDER — HEPARIN SOD (PORK) LOCK FLUSH 100 UNIT/ML IV SOLN
500.0000 [IU] | Freq: Once | INTRAVENOUS | Status: AC
  Administered 2022-12-18: 500 [IU]

## 2022-12-18 NOTE — Progress Notes (Signed)
[facility] OFFICE PROGRESS NOTE  Patient Care Team: de [place], [name], MD as PCP - General (Family Medicine) Malmfelt, Lise Auer, RN as Oncology Nurse Navigator [name], MD as Consulting Physician (Hematology and Oncology) [name], MD (Inactive) as Consulting Physician (Otolaryngology) [name], MD as Consulting Physician (Radiation Oncology) Laren Boom, DO as Consulting Physician (Otolaryngology)  ASSESSMENT & PLAN:   Squamous cell carcinoma of base of tongue [facility]) This is a very pleasant 62 year old male patient with past medical history significant for obstructive sleep apnea, otherwise in excellent health who was recently diagnosed with squamous cell carcinoma of the base of the tongue, p16 positive, 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    He completed 4 cycles of 5-FU/carbo/Keytruda for induction.   He had remarkable response and no toxicity to report He was discussed in the head and neck tumor board and the plan was to consider concurrent chemoradiation to the neck and radiation to the mediastinum given oligometastatic disease.   He clearly understands this is not necessarily curative in intent however may give him prolonged progression free interval.   He started weekly cisplatin 09/19/2020 He received 6 weekly cycles of cisplatin concomintant with radiation. EOT PET with complete response  He is here for follow up. He had scans recently once again indicating no evidence of disease. Repeat imaging in 1 yr or sooner as needed.  Peripheral Neuropathy Balance issues and ineffective treatment with Gabapentin. -Discontinue Gabapentin. -Start Pregabalin (Lyrica). -Consider referral to Neurology if no improvement with Pregabalin.  Dysphagia Difficulty swallowing, likely related to underlying  condition. -Continue follow-up with Dr. Marcella Dubs for management.  Asthma Well-controlled since starting Trelegy. -Continue current management with Trelegy.  Thyroid Regular monitoring by PCP, recent results reported as normal. -Continue current management and monitoring by PCP.  Port Removal Patient has a port that is no longer needed. -Order port removal.  General Health Maintenance / Followup Plans -Annual scans ordered for surveillance due to history of cancer. -Follow-up with ENT for ongoing surveillance. -Report any new or concerning symptoms promptly. -Next appointment scheduled for Fall 2025.    [name] MD'   Orders Placed This Encounter  Procedures   CT Chest W Contrast    Standing Status:   Future    Standing Expiration Date:   12/18/2023    Order Specific Question:   If indicated for the ordered procedure, I authorize the administration of contrast media per Radiology protocol    Answer:   Yes    Order Specific Question:   Does the patient have a contrast media/X-ray dye allergy?    Answer:   No    Order Specific Question:   Preferred imaging location?    Answer:   [facility]   CT Soft Tissue Neck W Contrast    Standing Status:   Future    Standing Expiration Date:   12/18/2023    Order Specific Question:   If indicated for the ordered procedure, I authorize the administration of contrast media per Radiology protocol    Answer:   Yes    Order Specific Question:   Does the patient have a contrast media/X-ray dye allergy?    Answer:   No    Order Specific Question:   Preferred imaging location?    Answer:   [facility]   IR Removal Tun Access W/ Port W/O  [place]    Standing Status:   Future    Standing Expiration Date:   12/18/2023    Order Specific Question:   Reason for exam:    Answer:   removal of port    Order Specific Question:   Preferred Imaging Location?    Answer:   [facility]   [name], MD 12/18/2022 4:11  PM  INTERVAL HISTORY:  Ms. Goldie is here for follow-up.    Discussed the use of AI scribe software for clinical note transcription with the patient, who gave verbal consent to proceed.  History of Present Illness    The patient, with a history of neuropathy, presents with balance issues and difficulty swallowing. He reports a few missteps and moments of imbalance but has not fallen. He believes these balance issues are due to his neuropathy. He has been taking gabapentin for his neuropathy, but reports that it doesn't seem to help, likening it to candy.  In addition to balance issues, the patient also reports difficulty swallowing, which he acknowledges is part of his condition. He also mentions experiencing dry mouth. Despite these challenges, he reports that overall, he has been doing well and has been able to participate in enjoyable activities such as fishing trips and travel.  The patient also mentions a concern with a port that has not yet been removed. He expresses a willingness to have it taken out, as it doesn't seem to be causing any issues.   ALLERGIES:  is allergic to tessalon [benzonatate].  MEDICATIONS:  Current Outpatient Medications  Medication Sig Dispense Refill   albuterol (PROVENTIL) (2.5 MG/3ML) 0.083% nebulizer solution Take 3 mLs (2.5 mg total) by nebulization every 6 (six) hours as needed for wheezing or shortness of breath. 120 mL 12   albuterol (VENTOLIN HFA) 108 (90 Base) MCG/ACT inhaler Inhale 2 puffs into the lungs as needed for wheezing or shortness of breath.     cetirizine (ZYRTEC) 10 MG tablet Take 10 mg by mouth daily.     cyclobenzaprine (FLEXERIL) 10 MG tablet Take 1 tablet (10 mg total) by mouth 2 (two) times daily as needed for muscle spasms. 30 tablet 2   fluticasone (FLONASE) 50 MCG/ACT nasal spray Place 2 sprays into both nostrils daily. Place 1 spray each nostril at night 16 g 11   Fluticasone-Umeclidin-Vilant (TRELEGY ELLIPTA) 200-62.5-25  MCG/ACT AEPB Inhale 1 puff into the lungs daily at 2 am. 90 each 3   gabapentin (NEURONTIN) 300 MG capsule Take 1 capsule (300 mg total) by mouth at bedtime. 30 capsule 1   omeprazole (PRILOSEC) 40 MG capsule Take 40 mg by mouth daily.     No current facility-administered medications for this visit.   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)   05/18/2020 Cancer Staging   Staging form: Pharynx - HPV-Mediated Oropharynx, AJCC 8th Edition - Clinical stage from 05/18/2020: Stage IV (cT4, cN3, cM1, p16+) - Signed by [name], MD on 12/10/2020   06/01/2020 Cancer Staging   Staging form: Pharynx - HPV-Mediated Oropharynx, AJCC 8th Edition - Pathologic stage from 06/01/2020: No Stage Recommended (cT4, cN3, cM1, p16+) - Signed by [name], MD on 06/01/2020  Stage prefix: Initial diagnosis   06/12/2020 - 08/21/2020 Chemotherapy         09/10/2020 - 09/10/2020 Chemotherapy         09/19/2020 - 11/09/2020 Chemotherapy   Patient is on Treatment Plan : HEAD/NECK Cisplatin q7d     02/15/2021 Imaging   Improved findings when compared to prior PET-CT suggesting a good response to treatment. Small focus of residual hypermetabolism in the anterior aspect of the floor the mouth and small residual slightly hypermetabolic level 2 lymph nodes. Recommend continued surveillance. No findings for metastatic disease involving the chest, abdomen or pelvis.Small patchy infiltrates in the right lung.      PHYSICAL EXAMINATION: ECOG PERFORMANCE STATUS: 1 - Symptomatic but completely ambulatory  Vitals:   12/18/22 1350  BP: (!) 103/58  Pulse: 100  Resp: 18  Temp: 97.9 F (36.6 C)  SpO2: 97%    Filed Weights   12/18/22 1350  Weight: 286 lb 3.2 oz (129.8 kg)    Physical Exam Constitutional:      Appearance: Normal appearance.  HENT:     Head: Normocephalic and atraumatic.  Eyes:     Pupils: Pupils are equal, round, and reactive to light.  Cardiovascular:     Rate and Rhythm: Normal rate and regular rhythm.     Pulses: Normal pulses.     Heart sounds: Normal heart sounds.  Pulmonary:     Effort: Pulmonary effort is normal.     Breath sounds: Normal breath sounds.  Abdominal:     General: Abdomen is flat. Bowel sounds are normal.  Musculoskeletal:        General: Normal range of motion.     Cervical back: Normal range of motion and neck supple. No rigidity.  Lymphadenopathy:     Cervical: No cervical adenopathy.  Skin:    General: Skin is warm and dry.  Neurological:     General: No focal deficit present.     Mental Status: He is alert.      LABORATORY DATA:  I have reviewed the data as listed    Component Value Date/Time   NA 140 12/18/2022 1331   NA 142 10/22/2022 1355   K 3.8 12/18/2022 1331   CL 106 12/18/2022 1331   CO2 28 12/18/2022 1331   GLUCOSE 115 (H) 12/18/2022 1331   BUN 15 12/18/2022 1331   BUN 14 10/22/2022 1355   CREATININE 1.33 (H) 12/18/2022 1331   CALCIUM 9.1 12/18/2022 1331   PROT 6.7 12/18/2022 1331   PROT 6.7 10/22/2022 1355   ALBUMIN 4.0 12/18/2022 1331   ALBUMIN 4.3 10/22/2022 1355   AST 15 12/18/2022 1331   ALT 12 12/18/2022 1331   ALKPHOS 46 12/18/2022 1331   BILITOT 0.9 12/18/2022 1331   GFRNONAA >60 12/18/2022 1331    No results found for: "SPEP", "UPEP"  Lab Results  Component Value Date   WBC 6.3 12/18/2022   NEUTROABS 4.3 12/18/2022   HGB 14.0 12/18/2022   HCT 41.3 12/18/2022   MCV 94.7 12/18/2022   PLT 139 (L) 12/18/2022      Chemistry      Component Value Date/Time   NA 140 12/18/2022 1331   NA 142 10/22/2022 1355   K 3.8 12/18/2022 1331   CL 106 12/18/2022 1331   CO2 28 12/18/2022 1331   BUN 15  12/18/2022 1331   BUN 14 10/22/2022 1355   CREATININE 1.33 (H) 12/18/2022 1331      Component Value Date/Time   CALCIUM 9.1  12/18/2022 1331   ALKPHOS 46 12/18/2022 1331   AST 15 12/18/2022 1331   ALT 12 12/18/2022 1331   BILITOT 0.9 12/18/2022 1331     I spent 30 minutes in the care of this patient including history, review of records, physical exam, counseling and coordination of care.  [name] MD

## 2022-12-18 NOTE — Assessment & Plan Note (Signed)
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    He completed 4 cycles of 5-FU/carbo/Keytruda for induction.   He had remarkable response and no toxicity to report He was discussed in the head and neck tumor board and the plan was to consider concurrent chemoradiation to the neck and radiation to the mediastinum given oligometastatic disease.   He clearly understands this is not necessarily curative in intent however may give him prolonged progression free interval.   He started weekly cisplatin 09/19/2020 He received 6 weekly cycles of cisplatin concomintant with radiation. EOT PET with complete response  He is here for follow up. He had scans recently once again indicating no evidence of disease. Repeat imaging in 1 yr or sooner as needed.  Peripheral Neuropathy Balance issues and ineffective treatment with Gabapentin. -Discontinue Gabapentin. -Start Pregabalin (Lyrica). -Consider referral to Neurology if no improvement with Pregabalin.  Dysphagia Difficulty swallowing, likely related to underlying condition. -Continue follow-up with Dr. Marcella Dubs for management.  Asthma Well-controlled since starting Trelegy. -Continue current management with Trelegy.  Thyroid Regular monitoring by PCP, recent results reported as normal. -Continue current management and monitoring by PCP.  Port Removal Patient has a port that is no longer needed. -Order port removal.  General Health Maintenance / Followup Plans -Annual scans ordered for surveillance due to history of cancer. -Follow-up with ENT for ongoing surveillance. -Report any new  or concerning symptoms promptly. -Next appointment scheduled for Fall 2025.    [name] MD'

## 2022-12-18 NOTE — Telephone Encounter (Signed)
Called patient to schedule appt. W/ Dr. Irene Pap in the next 2-3 weeks per Dr. Leighton Roach request on 12/18/22.  Patient had spoken w/ Luanne Bras and was going to call her to schedule when he has his calendar available.  Patient also mentioned that the visit will have to be pre-authorized and he is going to check on that when he sees his referring provider next.

## 2022-12-23 ENCOUNTER — Encounter: Payer: Self-pay | Admitting: Hematology and Oncology

## 2022-12-23 ENCOUNTER — Ambulatory Visit (INDEPENDENT_AMBULATORY_CARE_PROVIDER_SITE_OTHER): Payer: PRIVATE HEALTH INSURANCE | Admitting: Family Medicine

## 2022-12-23 ENCOUNTER — Encounter (HOSPITAL_BASED_OUTPATIENT_CLINIC_OR_DEPARTMENT_OTHER): Payer: Self-pay | Admitting: Family Medicine

## 2022-12-23 ENCOUNTER — Other Ambulatory Visit (HOSPITAL_BASED_OUTPATIENT_CLINIC_OR_DEPARTMENT_OTHER): Payer: Self-pay

## 2022-12-23 VITALS — BP 112/75 | HR 94 | Ht 75.0 in | Wt 285.0 lb

## 2022-12-23 DIAGNOSIS — M25561 Pain in right knee: Secondary | ICD-10-CM | POA: Insufficient documentation

## 2022-12-23 DIAGNOSIS — J45909 Unspecified asthma, uncomplicated: Secondary | ICD-10-CM

## 2022-12-23 DIAGNOSIS — Z0000 Encounter for general adult medical examination without abnormal findings: Secondary | ICD-10-CM | POA: Diagnosis not present

## 2022-12-23 MED ORDER — PREVNAR 20 0.5 ML IM SUSY
PREFILLED_SYRINGE | INTRAMUSCULAR | 0 refills | Status: DC
  Filled 2022-12-23: qty 0.5, 1d supply, fill #0

## 2022-12-23 NOTE — Assessment & Plan Note (Signed)
Medicare wellness visit completed today.  Responses to screening questionnaires reviewed as above.  We did have discussion related to pneumococcal vaccination, Tdap booster.  He will look into records regarding tetanus and we will see if this does need to be completed and if so can be done at next office visit

## 2022-12-23 NOTE — Progress Notes (Signed)
Subjective:    Alexander [name] is a 62 y.o. male who presents for a Welcome to Medicare exam.   Cardiac Risk Factors include: advanced age (>25men, >52 women);male gender;obesity (BMI >30kg/m2)     Objective:    Today's Vitals   12/23/22 1309 12/23/22 1317  BP: 112/75   Pulse: 94   SpO2: 99%   Weight: 285 lb (129.3 kg)   Height: 6\' 3"  (1.905 m)   PainSc: 7  0-No pain  PainLoc: Leg    Body mass index is 35.62 kg/m.  Medications Outpatient Encounter Medications as of 12/23/2022  Medication Sig   albuterol (PROVENTIL) (2.5 MG/3ML) 0.083% nebulizer solution Take 3 mLs (2.5 mg total) by nebulization every 6 (six) hours as needed for wheezing or shortness of breath.   albuterol (VENTOLIN HFA) 108 (90 Base) MCG/ACT inhaler Inhale 2 puffs into the lungs as needed for wheezing or shortness of breath.   cetirizine (ZYRTEC) 10 MG tablet Take 10 mg by mouth daily.   cyclobenzaprine (FLEXERIL) 10 MG tablet Take 1 tablet (10 mg total) by mouth 2 (two) times daily as needed for muscle spasms.   fluticasone (FLONASE) 50 MCG/ACT nasal spray Place 2 sprays into both nostrils daily. Place 1 spray each nostril at night   Fluticasone-Umeclidin-Vilant (TRELEGY ELLIPTA) 200-62.5-25 MCG/ACT AEPB Inhale 1 puff into the lungs daily at 2 am.   omeprazole (PRILOSEC) 40 MG capsule Take 40 mg by mouth daily.   gabapentin (NEURONTIN) 300 MG capsule Take 1 capsule (300 mg total) by mouth at bedtime. (Patient not taking: Reported on 12/23/2022)   Facility-Administered Encounter Medications as of 12/23/2022  Medication   magnesium sulfate 2 GM/50ML IVPB   History: Past Medical History:  Diagnosis Date   Asthma    Cancer (HCC)    GERD (gastroesophageal reflux disease)    Sleep apnea    CPAP- does not wear   Past Surgical History:  Procedure Laterality Date   BRONCHIAL NEEDLE ASPIRATION BIOPSY  05/22/2020   Procedure: BRONCHIAL NEEDLE ASPIRATION BIOPSIES;  Surgeon: Josephine Igo, DO;  Location: [facility]  ENDOSCOPY;  Service: Pulmonary;;   DIRECT LARYNGOSCOPY  05/09/2020   Procedure: DIRECT LARYNGOSCOPY;  Surgeon: [name], MD;  Location: [facility];  Service: ENT;;   IR GASTROSTOMY TUBE MOD SED  09/13/2020   IR GASTROSTOMY TUBE REMOVAL  12/14/2020   IR IMAGING GUIDED PORT INSERTION  06/12/2020   MASS EXCISION N/A 05/09/2020   Procedure: EXCISION TONGUE MASS;  Surgeon: [name], MD;  Location: [facility] OR;  Service: ENT;  Laterality: N/A;   TONSILLECTOMY     TRACHEOSTOMY TUBE PLACEMENT  05/09/2020   Procedure: TRACHEOSTOMY;  Surgeon: [name], MD;  Location: [facility] OR;  Service: ENT;;   VIDEO BRONCHOSCOPY WITH ENDOBRONCHIAL ULTRASOUND N/A 05/22/2020   Procedure: VIDEO BRONCHOSCOPY WITH ENDOBRONCHIAL ULTRASOUND;  Surgeon: Josephine Igo, DO;  Location: [facility] ENDOSCOPY;  Service: Pulmonary;  Laterality: N/A;    Family History  Problem Relation Age of Onset   Breast cancer Mother    Cancer - Cervical Mother    Colon cancer Father    Colon cancer Brother    Social History   Occupational History   Not on file  Tobacco Use   Smoking status: Never    Passive exposure: Past   Smokeless tobacco: Never  Vaping Use   Vaping status: Never Used  Substance and Sexual Activity   Alcohol use: Not Currently    Comment: "4-5 drinks a year for special  ocassions" 05/08/20   Drug use: Never   Sexual activity: Not Currently    Tobacco Counseling Counseling given: Not Answered   Immunizations and Health Maintenance Immunization History  Administered Date(s) Administered   Influenza,inj,Quad PF,6+ Mos 11/11/2022   Influenza-Unspecified 11/08/2021   PFIZER(Purple Top)SARS-COV-2 Vaccination 02/07/2019, 02/28/2019, 11/08/2021   Pfizer(Comirnaty)Fall Seasonal Vaccine 12 years and older 11/19/2022   Zoster Recombinant(Shingrix) 03/01/2021, 09/19/2021   Health Maintenance Due  Topic Date Due   DTaP/Tdap/Td (1 - Tdap) Never done   Colonoscopy  01/18/2021    Activities of Daily  Living    12/23/2022    1:19 PM  In your present state of health, do you have any difficulty performing the following activities:  Hearing? 0  Vision? 0  Difficulty concentrating or making decisions? 0  Walking or climbing stairs? 0  Dressing or bathing? 0  Doing errands, shopping? 0  Preparing Food and eating ? N  Using the Toilet? N  In the past six months, have you accidently leaked urine? N  Do you have problems with loss of bowel control? N  Managing your Medications? N  Managing your Finances? N  Housekeeping or managing your Housekeeping? N    Physical Exam   Physical Exam (optional), or other factors deemed appropriate based on the beneficiary's medical and social history and current clinical standards.  Advanced Directives: Does Patient Have a Medical Advance Directive?: No Would patient like information on creating a medical advance directive?: Yes (MAU/Ambulatory/Procedural Areas - Information given)   EKG:  normal EKG, normal sinus rhythm, unchanged from previous tracings, Completed in April 2024    Assessment:    This is a routine wellness  examination for this patient .   Vision/Hearing screen No results found.   Goals      Weight (lb) < 200 lb (90.7 kg)     Pt would like to get down to 250lbs         Depression Screen    12/23/2022    1:32 PM 12/23/2022    1:28 PM 11/19/2022    1:09 PM 10/22/2022    1:17 PM  PHQ 2/9 Scores  PHQ - 2 Score 0 0 0 0  PHQ- 9 Score 0 0 0 0     Fall Risk    12/23/2022    1:31 PM  Fall Risk   Falls in the past year? 0  Number falls in past yr: 0  Injury with Fall? 0  Risk for fall due to : No Fall Risks  Follow up Falls evaluation completed    Cognitive Function    12/23/2022    1:33 PM  MMSE - Mini Mental State Exam  Not completed: Unable to complete        12/23/2022    1:33 PM  6CIT Screen  What Year? 0 points  What month? 0 points  What time? 0 points  Count back from 20 0 points  Months in  reverse 0 points  Repeat phrase 0 points  Total Score 0 points    Patient Care Team: de [place], [name], MD as PCP - General (Family Medicine) Malmfelt, Lise Auer, RN as Oncology Nurse Navigator [name], MD as Consulting Physician (Hematology and Oncology) [name], MD (Inactive) as Consulting Physician (Otolaryngology) [name], MD as Consulting Physician (Radiation Oncology) Laren Boom, DO as Consulting Physician (Otolaryngology)     Plan:    Encounter for Medicare annual wellness exam Assessment & Plan: Medicare wellness visit  completed today.  Responses to screening questionnaires reviewed as above.  We did have discussion related to pneumococcal vaccination, Tdap booster.  He will look into records regarding tetanus and we will see if this does need to be completed and if so can be done at next office visit   Right knee pain, unspecified chronicity Assessment & Plan: Patient reports ongoing issues with right knee pain.  Has not had prior evaluation.  Symptoms have become gradually more prevalent for patient. We can proceed with initial x-ray imaging and we will plan to arrange for follow-up once x-ray report received in order to further discuss knee symptoms, review x-rays and discuss treatment options  Orders: -     DG Knee Complete 4 Views Right; Future  Asthma, unspecified asthma severity, unspecified whether complicated, unspecified whether persistent -     Pneumococcal conjugate vaccine 20-valent  Patient also indicates some concerns regarding balance issues.  Does not have any significant lightheadedness, dizziness, presyncopal episodes.  No orthostatic changes.  Balance issues tend to be when walking where he may feel unsteady on his feet.  He does wonder if it could be related to neuropathy given his prior cancer treatment history. On history, I do think that it is less likely to be related to cardiac cause and neuropathy is a likely  culprit.  Discussed general considerations and options.  Patient would prefer to monitor symptoms for now  I have personally reviewed and noted the following in the patient's chart:   Medical and social history Use of alcohol, tobacco or illicit drugs  Current medications and supplements Functional ability and status Nutritional status Physical activity Advanced directives List of other physicians Hospitalizations, surgeries, and ER visits in previous 12 months Vitals Screenings to include cognitive, depression, and falls Referrals and appointments  In addition, I have reviewed and discussed with patient certain preventive protocols, quality metrics, and best practice recommendations. A written personalized care plan for preventive services as well as general preventive health recommendations were provided to patient.   ___________________________________________ Travontae Freiberger de [place], MD, ABFM, CAQSM Primary Care and Sports Medicine [facility]

## 2022-12-23 NOTE — Patient Instructions (Signed)
[name] , Thank you for taking time to come for your Medicare Wellness Visit. I appreciate your ongoing commitment to your health goals. Please review the following plan we discussed and let me know if I can assist you in the future.   Screening recommendations/referrals: Colonoscopy: Getting done at [facility]  Recommended yearly ophthalmology/optometry visit for glaucoma screening and checkup Recommended yearly dental visit for hygiene and checkup   Advanced directives: Information provided  Conditions/risks identified: falls  Next appointment: 1 year  Preventive Care 30 Years and Older, Male Preventive care refers to lifestyle choices and visits with your health care provider that can promote health and wellness. What does preventive care include? A yearly physical exam. This is also called an annual well check. Dental exams once or twice a year. Routine eye exams. Ask your health care provider how often you should have your eyes checked. Personal lifestyle choices, including: Daily care of your teeth and gums. Regular physical activity. Eating a healthy diet. Avoiding tobacco and drug use. Limiting alcohol use. Practicing safe sex. Taking low doses of aspirin every day. Taking vitamin and mineral supplements as recommended by your health care provider. What happens during an annual well check? The services and screenings done by your health care provider during your annual well check will depend on your age, overall health, lifestyle risk factors, and family history of disease. Counseling  Your health care provider may ask you questions about your: Alcohol use. Tobacco use. Drug use. Emotional well-being. Home and relationship well-being. Sexual activity. Eating habits. History of falls. Memory and ability to understand (cognition). Work and work Astronomer. Screening  You may have the following tests or measurements: Height, weight, and BMI. Blood pressure. Lipid  and cholesterol levels. These may be checked every 5 years, or more frequently if you are over 37 years old. Skin check. Lung cancer screening. You may have this screening every year starting at age 80 if you have a 30-pack-year history of smoking and currently smoke or have quit within the past 15 years. Fecal occult blood test (FOBT) of the stool. You may have this test every year starting at age 68. Flexible sigmoidoscopy or colonoscopy. You may have a sigmoidoscopy every 5 years or a colonoscopy every 10 years starting at age 9. Prostate cancer screening. Recommendations will vary depending on your family history and other risks. Hepatitis C blood test. Hepatitis B blood test. Sexually transmitted disease (STD) testing. Diabetes screening. This is done by checking your blood sugar (glucose) after you have not eaten for a while (fasting). You may have this done every 1-3 years. Abdominal aortic aneurysm (AAA) screening. You may need this if you are a current or former smoker. Osteoporosis. You may be screened starting at age 34 if you are at high risk. Talk with your health care provider about your test results, treatment options, and if necessary, the need for more tests. Vaccines  Your health care provider may recommend certain vaccines, such as: Influenza vaccine. This is recommended every year. Tetanus, diphtheria, and acellular pertussis (Tdap, Td) vaccine. You may need a Td booster every 10 years. Zoster vaccine. You may need this after age 58. Pneumococcal 13-valent conjugate (PCV13) vaccine. One dose is recommended after age 79. Pneumococcal polysaccharide (PPSV23) vaccine. One dose is recommended after age 29. Talk to your health care provider about which screenings and vaccines you need and how often you need them. This information is not intended to replace advice given to you by your health  care provider. Make sure you discuss any questions you have with your health care  provider. Document Released: 03/02/2015 Document Revised: 10/24/2015 Document Reviewed: 12/05/2014 Elsevier Interactive Patient Education  2017 [employer].  Fall Prevention in the Home Falls can cause injuries. They can happen to people of all ages. There are many things you can do to make your home safe and to help prevent falls. What can I do on the outside of my home? Regularly fix the edges of walkways and driveways and fix any cracks. Remove anything that might make you trip as you walk through a door, such as a raised step or threshold. Trim any bushes or trees on the path to your home. Use bright outdoor lighting. Clear any walking paths of anything that might make someone trip, such as rocks or tools. Regularly check to see if handrails are loose or broken. Make sure that both sides of any steps have handrails. Any raised decks and porches should have guardrails on the edges. Have any leaves, snow, or ice cleared regularly. Use sand or salt on walking paths during winter. Clean up any spills in your garage right away. This includes oil or grease spills. What can I do in the bathroom? Use night lights. Install grab bars by the toilet and in the tub and shower. Do not use towel bars as grab bars. Use non-skid mats or decals in the tub or shower. If you need to sit down in the shower, use a plastic, non-slip stool. Keep the floor dry. Clean up any water that spills on the floor as soon as it happens. Remove soap buildup in the tub or shower regularly. Attach bath mats securely with double-sided non-slip rug tape. Do not have throw rugs and other things on the floor that can make you trip. What can I do in the bedroom? Use night lights. Make sure that you have a light by your bed that is easy to reach. Do not use any sheets or blankets that are too big for your bed. They should not hang down onto the floor. Have a firm chair that has side arms. You can use this for support while  you get dressed. Do not have throw rugs and other things on the floor that can make you trip. What can I do in the kitchen? Clean up any spills right away. Avoid walking on wet floors. Keep items that you use a lot in easy-to-reach places. If you need to reach something above you, use a strong step stool that has a grab bar. Keep electrical cords out of the way. Do not use floor polish or wax that makes floors slippery. If you must use wax, use non-skid floor wax. Do not have throw rugs and other things on the floor that can make you trip. What can I do with my stairs? Do not leave any items on the stairs. Make sure that there are handrails on both sides of the stairs and use them. Fix handrails that are broken or loose. Make sure that handrails are as long as the stairways. Check any carpeting to make sure that it is firmly attached to the stairs. Fix any carpet that is loose or worn. Avoid having throw rugs at the top or bottom of the stairs. If you do have throw rugs, attach them to the floor with carpet tape. Make sure that you have a light switch at the top of the stairs and the bottom of the stairs. If you do not  have them, ask someone to add them for you. What else can I do to help prevent falls? Wear shoes that: Do not have high heels. Have rubber bottoms. Are comfortable and fit you well. Are closed at the toe. Do not wear sandals. If you use a stepladder: Make sure that it is fully opened. Do not climb a closed stepladder. Make sure that both sides of the stepladder are locked into place. Ask someone to hold it for you, if possible. Clearly mark and make sure that you can see: Any grab bars or handrails. First and last steps. Where the edge of each step is. Use tools that help you move around (mobility aids) if they are needed. These include: Canes. Walkers. Scooters. Crutches. Turn on the lights when you go into a dark area. Replace any light bulbs as soon as they burn  out. Set up your furniture so you have a clear path. Avoid moving your furniture around. If any of your floors are uneven, fix them. If there are any pets around you, be aware of where they are. Review your medicines with your doctor. Some medicines can make you feel dizzy. This can increase your chance of falling. Ask your doctor what other things that you can do to help prevent falls. This information is not intended to replace advice given to you by your health care provider. Make sure you discuss any questions you have with your health care provider. Document Released: 11/30/2008 Document Revised: 07/12/2015 Document Reviewed: 03/10/2014 Elsevier Interactive Patient Education  2017 [employer].

## 2022-12-23 NOTE — Assessment & Plan Note (Signed)
Patient reports ongoing issues with right knee pain.  Has not had prior evaluation.  Symptoms have become gradually more prevalent for patient. We can proceed with initial x-ray imaging and we will plan to arrange for follow-up once x-ray report received in order to further discuss knee symptoms, review x-rays and discuss treatment options

## 2022-12-24 ENCOUNTER — Encounter (INDEPENDENT_AMBULATORY_CARE_PROVIDER_SITE_OTHER): Payer: Self-pay | Admitting: Otolaryngology

## 2022-12-24 ENCOUNTER — Encounter: Payer: Self-pay | Admitting: Hematology and Oncology

## 2022-12-24 ENCOUNTER — Ambulatory Visit (INDEPENDENT_AMBULATORY_CARE_PROVIDER_SITE_OTHER): Payer: PRIVATE HEALTH INSURANCE | Admitting: Otolaryngology

## 2022-12-24 ENCOUNTER — Telehealth: Payer: Self-pay | Admitting: Pulmonary Disease

## 2022-12-24 VITALS — BP 113/75 | HR 105 | Ht 75.0 in | Wt 284.0 lb

## 2022-12-24 DIAGNOSIS — K219 Gastro-esophageal reflux disease without esophagitis: Secondary | ICD-10-CM

## 2022-12-24 DIAGNOSIS — R0981 Nasal congestion: Secondary | ICD-10-CM | POA: Diagnosis not present

## 2022-12-24 DIAGNOSIS — R1310 Dysphagia, unspecified: Secondary | ICD-10-CM

## 2022-12-24 DIAGNOSIS — Z923 Personal history of irradiation: Secondary | ICD-10-CM

## 2022-12-24 DIAGNOSIS — J3089 Other allergic rhinitis: Secondary | ICD-10-CM

## 2022-12-24 DIAGNOSIS — R490 Dysphonia: Secondary | ICD-10-CM

## 2022-12-24 MED ORDER — CETIRIZINE HCL 10 MG PO TABS: 10.0000 mg | ORAL_TABLET | Freq: Every day | ORAL | 11 refills | Status: DC

## 2022-12-24 MED ORDER — FAMOTIDINE 20 MG PO TABS: 20.0000 mg | ORAL_TABLET | Freq: Two times a day (BID) | ORAL | 1 refills | Status: DC

## 2022-12-24 MED ORDER — FLUTICASONE PROPIONATE 50 MCG/ACT NA SUSP: 2.0000 | Freq: Every day | NASAL | 6 refills | Status: AC

## 2022-12-24 NOTE — Progress Notes (Signed)
ENT CONSULT:  Reason for Consult: SCC of the left tongue base, dysphagia post XRT   HPI: Alexander [name] is an 62 y.o. male with hx of SCCa of the tongue diagnosed in 2021, s/p biopsy and emergency trach, f/b cXRT, EOT Sept 2021, here to see me for dysphagia. He has chronic dry mouth. 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 He had a PEG which was removed in 2022. He had trouble with taste sensation and pain after the XRT, but over time, it improved, and he started to eat by mouth. His chemo catheter was removed 12/2022. Last surveillance scan was 12/12/22, CT chest and neck were done. He coughs with liquids and solids at times. He at times feels that the food get stuck in the back of his throat. He is eating regular diet. He at times regurgitates solid foods into his nose. No fevers, no chills. No hx of PNA. He gained weight.    Records Reviewed:  Dr Edward Qualia office 09/09/22 Moderate, persistent asthma. - will [name] - has progression of his symptoms - never got records from [place] - will have him try trelegy 200 one puff daily in place of advair - prn albuterol - if no improvement, then will need additional testing (PFT, CXR, CBC with diff, FeNO)   Allergic rhinitis. - prn flonase, zyrtec   Snoring. 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 [name] with oncology  ED visit 06/11/22 62 year old male history of head neck cancer, asthma, presents today after possible allergic reaction. Patient states he drank ginger tea with honey and had added honey. He then felt like he was having difficulty swallowing and like he was unable to breathe through the back of his throat but states he was breathing through his nose fine. He felt like he had some tingling in his mouth. Symptoms have improved. He did not have any sweating,  lightheadedness, dyspnea, or GI symptoms. He has not had an allergic reaction in the past. Does have a history of head and neck cancer which has previously been treated. He has a history of asthma. He has had some URI symptoms over the past several days. A/P 62 year old male with some tingling in mouth after drinking ginger honey tea that had honey mixed in.  He has been drinking this for some URI symptoms over the past several days Differential diagnosis includes was not limited to local allergic reaction, other paresthesias, acute allergic reaction, infectious etiology Patient appears to be hemodynamically stable and no obvious signs or symptoms of allergic reaction on my exam.  Given that he has had some asthma and has had URI symptoms with wheezing, patient received Solu-Medrol.  He also received Benadryl.  Patient was observed for several hours and remained asymptomatic here in the ED.  Chest x-ray was also obtained due to the fact that he had his URI symptoms no evidence of acute or maladies noted.    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     He completed 4 cycles of 5-FU/carbo/Keytruda for induction.   He had remarkable response and no toxicity to report He was discussed in the head and neck tumor board and the plan was to consider concurrent chemoradiation to the neck and radiation to the mediastinum given oligometastatic disease.   He clearly understands this is not necessarily curative in intent however may give him prolonged progression free  interval.   He started weekly cisplatin 09/19/2020 He received 6 weekly cycles of cisplatin concomintant with radiation. EOT PET with complete response   He is here for follow up. Given oligometastatic disease at presentation, we have discussed about doing routine imaging every 6 months. He is due for imaging in May. No clinical concerns today on ROS or PE. He will see Dr Basilio Cairo after imaging in May He will RTC in November for follow up with me. Past Medical History:  Diagnosis Date   Asthma    Cancer (HCC)    GERD (gastroesophageal reflux disease)    Sleep apnea    CPAP- does not wear    Past Surgical History:  Procedure Laterality Date   BRONCHIAL NEEDLE ASPIRATION BIOPSY  05/22/2020   Procedure: BRONCHIAL NEEDLE ASPIRATION BIOPSIES;  Surgeon: Josephine Igo, DO;  Location: [facility];  Service: Pulmonary;;   DIRECT LARYNGOSCOPY  05/09/2020   Procedure: DIRECT LARYNGOSCOPY;  Surgeon: [name], MD;  Location: [facility] OR;  Service: ENT;;   IR GASTROSTOMY TUBE MOD SED  09/13/2020   IR GASTROSTOMY TUBE REMOVAL  12/14/2020   IR IMAGING GUIDED PORT INSERTION  06/12/2020   MASS EXCISION N/A 05/09/2020   Procedure: EXCISION TONGUE MASS;  Surgeon: [name], MD;  Location: [facility] OR;  Service: ENT;  Laterality: N/A;   TONSILLECTOMY     TRACHEOSTOMY TUBE PLACEMENT  05/09/2020   Procedure: TRACHEOSTOMY;  Surgeon: [name], MD;  Location: [facility] OR;  Service: ENT;;   VIDEO BRONCHOSCOPY WITH ENDOBRONCHIAL ULTRASOUND N/A 05/22/2020   Procedure: VIDEO BRONCHOSCOPY WITH ENDOBRONCHIAL ULTRASOUND;  Surgeon: Josephine Igo, DO;  Location: [facility];  Service: Pulmonary;  Laterality: N/A;    Family History  Problem Relation Age of Onset   Breast cancer Mother    Cancer - Cervical Mother    Colon cancer Father    Colon cancer Brother     Social History:  reports that he has never smoked. He has been exposed to tobacco smoke. He has never used smokeless tobacco. He  reports that he does not currently use alcohol. He reports that he does not use drugs.  Allergies:  Allergies  Allergen Reactions   Tessalon [Benzonatate]     Medications: I have reviewed the patient's current medications.  The PMH, PSH, Medications, Allergies, and SH were reviewed and updated.  ROS: Constitutional: Negative for fever, weight loss and weight gain. Cardiovascular: Negative for chest pain and dyspnea on exertion. Respiratory: Is not experiencing shortness of breath at rest. Gastrointestinal: Negative for nausea and vomiting. Neurological: Negative for headaches. Psychiatric: The patient is not nervous/anxious  Blood pressure 113/75, pulse (!) 105, height 6\' 3"  (1.905 m), weight 284 lb (128.8 kg), SpO2 98%.  PHYSICAL EXAM:  Exam: General: Well-developed, well-nourished Communication and Voice: Clear pitch and clarity Respiratory Respiratory effort: Equal inspiration and expiration without stridor Cardiovascular Peripheral Vascular: Warm extremities with equal color/perfusion Eyes: No nystagmus with equal extraocular motion bilaterally Neuro/Psych/Balance: Patient oriented to person, place, and time; Appropriate mood and affect;  Gait is intact with no imbalance; Cranial nerves I-XII are intact Head and Face Inspection: Normocephalic and atraumatic without mass or lesion Palpation: Facial skeleton intact without bony stepoffs Salivary Glands: No mass or tenderness Facial Strength: Facial motility symmetric and full bilaterally ENT Pinna: External ear intact and fully developed External canal: Canal is patent with intact skin Tympanic Membrane: Clear and mobile External Nose: No scar or anatomic deformity Internal Nose: Septum is deviated to the left. No polyp, or purulence. Mucosal edema and erythema present.  Bilateral inferior turbinate hypertrophy.  Lips, Teeth, and gums: Mucosa and teeth intact and viable TMJ: No pain to palpation with full mobility Oral  cavity/oropharynx: No erythema or exudate, no lesions present Nasopharynx: No mass or lesion with intact mucosa, palate closure appears intact  Hypopharynx: Intact mucosa with pooling of secretions in post-cricoid area, pyriforms and along vallecula  Larynx Glottic: Full true vocal cord mobility without lesion or mass Supraglottic: Mild edema of the epiglottis and along vallecula, mild deformity of the epiglottis likely 2/2 post-XRT effects Interarytenoid Space: Moderate pachydermia edema Subglottic Space: not seen wee Neck Neck and Trachea: Midline trachea without mass or lesion Thyroid: No mass or nodularity Lymphatics: No lymphadenopathy  Procedure: Summary of Video-Laryngeal-Stroboscopy: post-XRT changes along epiglottis and left BOT, no masses or lesions, moderate post-cricoid edema, VF atrophy and mild thickening/edema of b/l VF, likely related to XRT, incomplete glottic closure, supraglottic compression. Mucosal wave is present but decreased.   Preoperative diagnosis: hoarseness/dysphagia  hx of left BOT SCCa, post-XRT  Postoperative diagnosis:   same + GERD LPR  Procedure: Flexible fiberoptic laryngoscopy with stroboscopy (16109)  Surgeon: Ashok Croon, MD  Anesthesia: Topical lidocaine and Afrin  Complications: None  Condition is stable throughout exam  Indications and consent:   The patient presents to the clinic with hoarseness. All the risks, benefits, and potential complications were reviewed with the patient preoperatively and informed verbal consent was obtained.  Procedure: The patient was seated upright in the exam chair.   Topical lidocaine and Afrin were applied to the nasal cavity. After adequate anesthesia had occurred, the flexible telescope was passed into the nasal cavity. The nasopharynx was patent without mass or lesion. The scope was passed behind the soft palate and directed toward the base of tongue. The base of tongue was visualized and was symmetric  with no apparent masses or abnormal appearing tissue. There were no signs of a mass or pooling of secretions in the piriform sinuses. The supraglottic structures were normal.  The true vocal cords are mobile. The medial edges were straight. Closure was incomplete with supraglottic compression. Periodicity present. The mucosal wave and amplitude were decreased. There is moderate interarytenoid pachydermia and post cricoid edema. The mucosa appears without lesions.   The laryngoscope was then slowly withdrawn and the patient tolerated the procedure well. There were no complications or blood loss.  Studies Reviewed: CT neck FINDINGS: Pharynx and larynx: Unchanged architectural distortion of the oropharynx with symmetric epiglottic thickening that is considered treatment related. No recurrent or metachronous mass is noted. Evidence of prior tracheostomy.   Salivary glands: Post treatment atrophy especially of the submandibular glands.   Thyroid: Normal   Lymph nodes: None enlarged or abnormal density.   Vascular: Porta catheter on the right.   Limited intracranial: Negative   Visualized orbits: Negative   Mastoids and visualized paranasal sinuses: Chronic generalized sinus opacification with patchy involvement especially of the ethmoids. Negative orbits.   Skeleton: No acute finding   Upper chest:  Reported separately   IMPRESSION: Stable post treatment neck.  No evidence of recurrent disease.  CT CHEST WITH CONTRAST   TECHNIQUE: Multidetector CT imaging of the chest was performed during intravenous contrast administration.   RADIATION DOSE REDUCTION: This exam was performed according to the departmental dose-optimization program which includes automated exposure control, adjustment of the mA and/or kV according to patient size and/or use of iterative reconstruction technique.   CONTRAST:  OMNIPAQUE IOHEXOL 300 MG/ML  SOLN   COMPARISON:  CT 11/21/2021 and older.   PET-CT 06/17/2021 and older.   FINDINGS: Cardiovascular: Right upper chest port. Heart is nonenlarged. No pericardial effusion. The thoracic aorta has a normal course and caliber.   Mediastinum/Nodes: No specific abnormal lymph node enlargement identified in the axillary regions, hilum or mediastinum. Normal caliber thoracic esophagus. Preserved thyroid gland.   Lungs/Pleura: There is some linear opacity lung bases likely scar or atelectasis. No consolidation, pneumothorax or effusion.   Upper Abdomen: No acute abnormality.   Musculoskeletal: Scattered degenerative changes along the thoracic spine with some endplate osteophytes and multilevel disc height loss.   IMPRESSION: No acute cardiopulmonary disease.  Chest port.   No developing mass lesion, fluid collection or lymph node enlargement in the thorax.  Assessment/Plan: Encounter Diagnoses  Name Primary?   Dysphagia, unspecified type Yes   Dysphonia    History of head and neck radiation    Chronic GERD    Chronic nasal congestion    Environmental and seasonal allergies    62 yoM hx of L BOT SCCa p16(+), s/p cXRT 2022 required PEG during the course of treatment, removed in 2022, here for chronic long-standing dysphagia, with food getting caught in his throat, and at times solid food regurgitation. Denies coughing or choking with liquids. Consumes regular diet currently. Denies weight loss, denies fevers, aspiration PNA. Has raspy voice since XRT treatment, not concerned about projection or quality of his voice.   Chronic dysphonia and dysphagia s/p XRT, able to tolerate regular diet, without instances of aspiration PNA Videostrobe today - post-XRT changes along epiglottis and left BOT, no masses or lesions, moderate post-cricoid edema, VF atrophy and mild thickening/edema of b/l VF, likely related to XRT, incomplete glottic closure, supraglottic compression. Mucosal wave is present but decreased.  - schedule swallow study  (MBS and esophagram) - previously had swallow therapy, will consider another trial of swallow therapy  2. Chronic nasal congestion  - start Flonase (nasal spray) and Zyrtec - start nasal saline rinses   3. Chronic GERD  - start Pepcid (Famotidine) for reflux  - Take Reflux Gourmet (natural supplement available on Amazon) to help with symptoms of chronic throat irritation   Thank you for allowing me to participate in the care of this patient. Please do not hesitate to contact me with any questions or concerns.   Ashok Croon, MD Otolaryngology [facility] Phone: [phone] Fax: [phone]    12/24/2022, 6:46 PM

## 2022-12-24 NOTE — Patient Instructions (Addendum)
-   schedule your swallow study (MBS and esophagram) - start Flonase (nasal spray) and Zyrtec - start Pepcid (Famotidine) for reflux  - start nasal saline rinses  - Take Reflux Gourmet (natural supplement available on Amazon) to help with symptoms of chronic throat irritation

## 2022-12-24 NOTE — Telephone Encounter (Signed)
PT needs Ventolin called in please. [facility].   Also, since Dr. Craige Cotta NL here, he would like to see Dr. Vassie Loll or Everardo All straight away, rather than an NP first.

## 2022-12-25 ENCOUNTER — Other Ambulatory Visit (HOSPITAL_BASED_OUTPATIENT_CLINIC_OR_DEPARTMENT_OTHER): Payer: Self-pay | Admitting: Hematology and Oncology

## 2022-12-25 MED ORDER — PREGABALIN 50 MG PO CAPS: 50.0000 mg | ORAL_CAPSULE | Freq: Two times a day (BID) | ORAL | 2 refills | Status: DC

## 2022-12-25 NOTE — Progress Notes (Signed)
Gabapentin discontinued. Switched to [employer] dispensed to pharmacy of choice.

## 2022-12-26 MED ORDER — ALBUTEROL SULFATE HFA 108 (90 BASE) MCG/ACT IN AERS: 2.0000 | INHALATION_SPRAY | RESPIRATORY_TRACT | 4 refills | Status: AC | PRN

## 2022-12-26 NOTE — Telephone Encounter (Signed)
Albuterol has been sent. Alexander [name] can you please schedule pt for a OV

## 2022-12-29 ENCOUNTER — Telehealth: Payer: Self-pay

## 2022-12-29 NOTE — Telephone Encounter (Signed)
Called [facility], spoke with [name]. Verified CPT code 02725 and [phone] No PA required.

## 2022-12-31 ENCOUNTER — Other Ambulatory Visit (HOSPITAL_COMMUNITY): Payer: Self-pay | Admitting: Otolaryngology

## 2022-12-31 DIAGNOSIS — R1310 Dysphagia, unspecified: Secondary | ICD-10-CM

## 2023-01-01 ENCOUNTER — Ambulatory Visit (HOSPITAL_BASED_OUTPATIENT_CLINIC_OR_DEPARTMENT_OTHER): Payer: PRIVATE HEALTH INSURANCE

## 2023-01-01 ENCOUNTER — Other Ambulatory Visit: Payer: Self-pay | Admitting: *Deleted

## 2023-01-01 DIAGNOSIS — M1711 Unilateral primary osteoarthritis, right knee: Secondary | ICD-10-CM | POA: Diagnosis not present

## 2023-01-01 DIAGNOSIS — M25561 Pain in right knee: Secondary | ICD-10-CM | POA: Diagnosis not present

## 2023-01-01 NOTE — Telephone Encounter (Signed)
Patient came in office. His care is being handled by [facility], so he is wanting to make sure that both Dr Vassie Loll and Dr Everardo All are covered through his insurance prior to scheduling. Provided patient with full doctor's names and NPIs for him to give to his organization. He will call to schedule.

## 2023-01-01 NOTE — Telephone Encounter (Signed)
Completed and faxed authorization request for pregablin per [facility] medical coverage. Of note pt benefit provider is under [employer].

## 2023-01-09 ENCOUNTER — Ambulatory Visit (HOSPITAL_COMMUNITY)
Admission: RE | Admit: 2023-01-09 | Discharge: 2023-01-09 | Disposition: A | Discharge status: Home / Self Care | Payer: PRIVATE HEALTH INSURANCE | Source: Ambulatory Visit | Attending: Hematology and Oncology | Admitting: Hematology and Oncology

## 2023-01-09 DIAGNOSIS — Z452 Encounter for adjustment and management of vascular access device: Secondary | ICD-10-CM | POA: Insufficient documentation

## 2023-01-09 DIAGNOSIS — C01 Malignant neoplasm of base of tongue: Secondary | ICD-10-CM | POA: Insufficient documentation

## 2023-01-09 HISTORY — PX: IR REMOVAL TUN ACCESS W/ PORT W/O FL MOD SED: IMG2290

## 2023-01-09 MED ORDER — LIDOCAINE-EPINEPHRINE 1 %-1:100000 IJ SOLN
INTRAMUSCULAR | Status: AC
  Filled 2023-01-09: qty 1

## 2023-01-09 MED ORDER — LIDOCAINE-EPINEPHRINE (PF) 2 %-1:200000 IJ SOLN: 20.0000 mL | Freq: Once | INTRAMUSCULAR | Status: DC

## 2023-01-09 MED ORDER — LIDOCAINE HCL 1 % IJ SOLN
20.0000 mL | Freq: Once | INTRAMUSCULAR | Status: AC
  Administered 2023-01-09: 5 mL

## 2023-01-09 MED ORDER — LIDOCAINE HCL 1 % IJ SOLN
INTRAMUSCULAR | Status: AC
  Filled 2023-01-09: qty 20

## 2023-01-09 MED ORDER — LIDOCAINE-EPINEPHRINE 1 %-1:100000 IJ SOLN
12.0000 mL | Freq: Once | INTRAMUSCULAR | Status: AC
  Administered 2023-01-09: 12 mL via INTRADERMAL

## 2023-01-09 NOTE — Procedures (Signed)
Interventional Radiology Procedure:   Indications: Port is no longer needed.  Procedure: Port removal  Findings: Complete removal of right chest port  Complications: None     EBL: Minimal  Plan: Keep incision dry for 24 hours.  Macklen Wilhoite R. Lowella Dandy, MD  Pager: [phone]

## 2023-01-28 ENCOUNTER — Ambulatory Visit (HOSPITAL_COMMUNITY)
Admission: RE | Admit: 2023-01-28 | Discharge: 2023-01-28 | Disposition: A | Discharge status: Home / Self Care | Payer: PRIVATE HEALTH INSURANCE | Source: Ambulatory Visit | Attending: Otolaryngology | Admitting: Otolaryngology

## 2023-01-28 ENCOUNTER — Other Ambulatory Visit (INDEPENDENT_AMBULATORY_CARE_PROVIDER_SITE_OTHER): Payer: Self-pay | Admitting: Otolaryngology

## 2023-01-28 DIAGNOSIS — R1310 Dysphagia, unspecified: Secondary | ICD-10-CM

## 2023-01-28 DIAGNOSIS — Z85810 Personal history of malignant neoplasm of tongue: Secondary | ICD-10-CM | POA: Insufficient documentation

## 2023-01-28 DIAGNOSIS — Z923 Personal history of irradiation: Secondary | ICD-10-CM

## 2023-01-28 DIAGNOSIS — R490 Dysphonia: Secondary | ICD-10-CM | POA: Insufficient documentation

## 2023-01-28 DIAGNOSIS — R1312 Dysphagia, oropharyngeal phase: Secondary | ICD-10-CM | POA: Insufficient documentation

## 2023-01-28 DIAGNOSIS — R0981 Nasal congestion: Secondary | ICD-10-CM

## 2023-01-28 DIAGNOSIS — J3089 Other allergic rhinitis: Secondary | ICD-10-CM

## 2023-01-28 DIAGNOSIS — K219 Gastro-esophageal reflux disease without esophagitis: Secondary | ICD-10-CM

## 2023-01-28 NOTE — Progress Notes (Signed)
Modified Barium Swallow Study  Patient Details  Name: Alexander [name] MRN: [id number] Date of Birth: 01/26/1961  Today's Date: 01/28/2023  Modified Barium Swallow completed.  Full report located under Chart Review in the Imaging Section.  History of Present Illness Meet Alexander [name] is a 62 y.o. 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 Patient completed definitive chemoradiation to his base of tongue and subcarinal lymph node in September 2022. He has routine visits to ENT for check on status, last being  stable,  reporting the following: pt with postradiation changes noted in the neck and no palpable adenopathy. Patient endorses occasional aspiration, primarily with eating quickly. No recent history of pneumonia. He was prescribed Pepcid by laryngology, which he has not been taking as he states rationale for the medication was not prescribed to him and he does not have symptoms of GERD. he was advised to take this to improve symptoms. He had an updated CT neck with contrast on 12/12/2022, which did not reveal any evidence of recurrent disease. He saw Dr. Irene [name] on 11/06 for his dysphagia symptoms, and had video stroboscopy at that visit which demonstrated no evidence of masses or lesions, with moderate postcricoid edema, vocal fold atrophy and mild thickening and edema of bilateral vocal folds, secondary to XRT. Patient still experiences occasional symptoms of aspiration, which he states primarily occurs when he is eating quickly. He has not been treated for any recent episodes of pneumonia and his weight has been stable.   Clinical Impression Pt demonstrates a mild dysphagia following a history of base of tongue cancer with radiation therapy. Pt reports dry mouth, frequent coughing with liquids and sometimes solids, sensation of food clinging in throat and occasions of nasal  regurgitation. He has not had pna or any overt choking episodes. Under fluoroscopy pt demonstrates delayed swallowing intiation (pyriform sinuses), loss of volume in the base of tongue and minimally visible epiglottis without inversion, decreased tension of the velum, reduced ROM of hyoid excursion and laryngeal elevation. Functionally these difference result in penetration before the swallow with partially open vesituble during the swallow. Also instances of trace lingual and base of tongue residue penetrating the airway post swallow. There is accumulation of silent penetrate and trace silent aspiration. Nectar thick penetration does elicit a cough. Best strategy during testing was a super supraglottic swallow and extra swallow (take a sip, hold it, hold your breath, swallow, cough, swallow again). Pt also required a liquid wash with solids due to moderate base of tongue/vallcular residue. There were some instances of trace backflow from the PES to the pyriform sinuses. Esophageal sweep unremarkable, but esophagram after MBS is a complete assessment. Recommend pt continue foods and drink of choice, but advaice f/u with OP SLP to train strategy and resume HEP for weakness and ROM, education regarding pulmonary hygiene etc. Factors that may increase risk of adverse event in presence of aspiration Alexander [name] & Alexander [name] 2021):    Swallow Evaluation Recommendations Recommendations: PO diet PO Diet Recommendation: Regular;Thin liquids (Level 0) Liquid Administration via: Cup;Straw Medication Administration: Whole meds with liquid Supervision: Patient able to self-feed Swallowing strategies  : Slow rate;Small bites/sips;Follow solids with liquids;Hold breath during swallow, followed by a cough (super supraglottic swallow)      Alexander [name], Alexander [name] 01/28/2023,2:32 PM

## 2023-02-03 NOTE — Telephone Encounter (Signed)
NFN 

## 2023-02-09 DIAGNOSIS — J301 Allergic rhinitis due to pollen: Secondary | ICD-10-CM | POA: Insufficient documentation

## 2023-02-09 DIAGNOSIS — J4540 Moderate persistent asthma, uncomplicated: Secondary | ICD-10-CM | POA: Insufficient documentation

## 2023-02-16 ENCOUNTER — Encounter: Payer: Self-pay | Admitting: Hematology and Oncology

## 2023-02-23 ENCOUNTER — Ambulatory Visit (INDEPENDENT_AMBULATORY_CARE_PROVIDER_SITE_OTHER): Payer: PRIVATE HEALTH INSURANCE | Admitting: Otolaryngology

## 2023-03-02 ENCOUNTER — Ambulatory Visit (INDEPENDENT_AMBULATORY_CARE_PROVIDER_SITE_OTHER): Payer: PRIVATE HEALTH INSURANCE | Admitting: Otolaryngology

## 2023-03-02 ENCOUNTER — Encounter (INDEPENDENT_AMBULATORY_CARE_PROVIDER_SITE_OTHER): Payer: Self-pay | Admitting: Otolaryngology

## 2023-03-02 VITALS — BP 123/81 | HR 85

## 2023-03-02 DIAGNOSIS — R490 Dysphonia: Secondary | ICD-10-CM

## 2023-03-02 DIAGNOSIS — J3089 Other allergic rhinitis: Secondary | ICD-10-CM

## 2023-03-02 DIAGNOSIS — R1310 Dysphagia, unspecified: Secondary | ICD-10-CM | POA: Diagnosis not present

## 2023-03-02 DIAGNOSIS — R0981 Nasal congestion: Secondary | ICD-10-CM

## 2023-03-02 DIAGNOSIS — K219 Gastro-esophageal reflux disease without esophagitis: Secondary | ICD-10-CM

## 2023-03-02 DIAGNOSIS — Z923 Personal history of irradiation: Secondary | ICD-10-CM

## 2023-03-02 DIAGNOSIS — R1313 Dysphagia, pharyngeal phase: Secondary | ICD-10-CM

## 2023-03-02 NOTE — Progress Notes (Signed)
 ENT Progress Note:  Update 03/02/23:   He returns following swallow study evaluation. Esophagram showed dysmotility, no aspiration and no strictures. MBS showed mild pharyngeal dysphagia. No change sin his voice and swallowing function since last office visit.     Initial Consultation: Reason for Consult: SCC of the left tongue base, dysphagia post XRT   HPI: Alexander [name] is an 63 y.o. male with hx of SCCa of the tongue diagnosed in 2021, s/p biopsy and emergency trach, f/b cXRT, EOT Sept 2021, here to see me for dysphagia. He has chronic dry mouth. 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 He had a PEG which was removed in 2022. He had trouble with taste sensation and pain after the XRT, but over time, it improved, and he started to eat by mouth. His chemo catheter was removed 12/2022. Last surveillance scan was 12/12/22, CT chest and neck were done. He coughs with liquids and solids at times. He at times feels that the food get stuck in the back of his throat. He is eating regular diet. He at times regurgitates solid foods into his nose. No fevers, no chills. No hx of PNA. He gained weight.    Records Reviewed:  Dr Shellia Carder office 09/09/22 Moderate, persistent asthma. - will [name] - has progression of his symptoms - never got records from [place]  - will have him try trelegy 200 one puff daily in place of advair - prn albuterol  - if no improvement, then will need additional testing (PFT, CXR, CBC with diff, FeNO)   Allergic rhinitis. - prn flonase , zyrtec    Snoring. 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 Amber Stalls with oncology  ED visit 06/11/22 64 year old male history of head neck cancer, asthma, presents today after possible allergic reaction. Patient states he drank ginger tea with honey and had added honey. He then  felt like he was having difficulty swallowing and like he was unable to breathe through the back of his throat but states he was breathing through his nose fine. He felt like he had some tingling in his mouth. Symptoms have improved. He did not have any sweating, lightheadedness, dyspnea, or GI symptoms. He has not had an allergic reaction in the past. Does have a history of head and neck cancer which has previously been treated. He has a history of asthma. He has had some URI symptoms over the past several days. A/P 63 year old male with some tingling in mouth after drinking ginger honey tea that had honey mixed in.  He has been drinking this for some URI symptoms over the past several days Differential diagnosis includes was not limited to local allergic reaction, other paresthesias, acute allergic reaction, infectious etiology Patient appears to be hemodynamically stable and no obvious signs or symptoms of allergic reaction on my exam.  Given that he has had some asthma and has had URI symptoms with wheezing, patient received Solu-Medrol .  He also received Benadryl .  Patient was observed for several hours and remained asymptomatic here in the ED.  Chest x-ray was also obtained due to the fact that he had his URI symptoms no evidence of acute or maladies noted.    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     He completed 4 cycles of 5-FU/carbo/Keytruda  for induction.   He had remarkable response and no toxicity to report He was discussed in the head and  neck tumor board and the plan was to consider concurrent chemoradiation to the neck and radiation to the mediastinum given oligometastatic disease.   He clearly understands this is not necessarily curative in intent however may give him prolonged progression free interval.   He started weekly cisplatin  09/19/2020 He received 6 weekly cycles of cisplatin  concomintant with radiation. EOT PET with complete response   He is here for follow up. Given oligometastatic disease at presentation, we have discussed about doing routine imaging every 6 months. He is due for imaging in May. No clinical concerns today on ROS or PE. He will see Dr Izell after imaging in May He will RTC in November for follow up with me. Past Medical History:  Diagnosis Date   Asthma    Cancer (HCC)    GERD (gastroesophageal reflux disease)    Sleep apnea    CPAP- does not wear    Past Surgical History:  Procedure Laterality Date   BRONCHIAL NEEDLE ASPIRATION BIOPSY  05/22/2020   Procedure: BRONCHIAL NEEDLE ASPIRATION BIOPSIES;  Surgeon: Brenna Adine CROME, DO;  Location: [facility] END[facility];  Service: Pulmonary;;   DIRECT LARYNGOSCOPY  05/09/2020   Procedure: DIRECT LARYNGOSCOPY;  Surgeon: Ethyl Lonni BRAVO, MD;  Location: [facility] OR;  Service: ENT;;   IR GASTROSTOMY TUBE MOD SED  09/13/2020   IR GASTROSTOMY TUBE REMOVAL  12/14/2020   IR IMAGING GUIDED PORT INSERTION  06/12/2020   IR REMOVAL TUN ACCESS W/ PORT W/O FL MOD SED  01/09/2023   MASS EXCISION N/A 05/09/2020   Procedure: EXCISION TONGUE MASS;  Surgeon: Ethyl Lonni BRAVO, MD;  Location: [facility] OR;  Service: ENT;  Laterality: N/A;   TONSILLECTOMY     TRACHEOSTOMY TUBE PLACEMENT  05/09/2020   Procedure: TRACHEOSTOMY;  Surgeon: Ethyl Lonni BRAVO, MD;  Location: [facility] OR;  Service: ENT;;   VIDEO BRONCHOSCOPY WITH ENDOBRONCHIAL ULTRASOUND N/A 05/22/2020   Procedure: VIDEO BRONCHOSCOPY WITH ENDOBRONCHIAL ULTRASOUND;  Surgeon: Brenna Adine CROME, DO;  Location: [facility] ENDOS[facility];  Service:  Pulmonary;  Laterality: N/A;    Family History  Problem Relation Age of Onset   Breast cancer Mother    Cancer - Cervical Mother    Colon cancer Father    Colon cancer Brother     Social History:  reports that he has never smoked. He has been exposed to tobacco smoke. He has never used smokeless tobacco. He reports that he does not currently use alcohol. He reports that he does not use drugs.  Allergies:  Allergies  Allergen Reactions   Tessalon [Benzonatate]     Medications: I have reviewed the patient's current medications.  The PMH, PSH, Medications, Allergies, and SH were reviewed and updated.  ROS: Constitutional: Negative for fever, weight loss and weight gain. Cardiovascular: Negative for chest pain and dyspnea on exertion. Respiratory: Is not experiencing shortness of breath at rest. Gastrointestinal: Negative for nausea and vomiting. Neurological: Negative for headaches. Psychiatric: The patient is not nervous/anxious  Blood pressure 123/81, pulse 85, SpO2 95%.  PHYSICAL EXAM:  Exam: General: Well-developed,  well-nourished Communication and Voice: Clear pitch and clarity Respiratory Respiratory effort: Equal inspiration and expiration without stridor Cardiovascular Peripheral Vascular: Warm extremities with equal color/perfusion Eyes: No nystagmus with equal extraocular motion bilaterally Neuro/Psych/Balance: Patient oriented to person, place, and time; Appropriate mood and affect; Gait is intact with no imbalance; Cranial nerves I-XII are intact Head and Face Inspection: Normocephalic and atraumatic without mass or lesion Palpation: Facial skeleton intact without bony stepoffs Salivary Glands: No mass or tenderness Facial Strength: Facial motility symmetric and full bilaterally ENT Pinna: External ear intact and fully developed External canal: Canal is patent with intact skin Tympanic Membrane: Clear and mobile External Nose: No scar or anatomic  deformity Internal Nose: Septum is deviated to the left. No polyp, or purulence. Mucosal edema and erythema present.  Bilateral inferior turbinate hypertrophy.  Lips, Teeth, and gums: Mucosa and teeth intact and viable TMJ: No pain to palpation with full mobility Oral cavity/oropharynx: No erythema or exudate, no lesions present Nasopharynx: No mass or lesion with intact mucosa, palate closure appears intact  Hypopharynx: Intact mucosa with pooling of secretions in post-cricoid area, pyriforms and along vallecula  Larynx Glottic: Full true vocal cord mobility without lesion or mass Supraglottic: Mild edema of the epiglottis and along vallecula, mild deformity of the epiglottis likely 2/2 post-XRT effects Interarytenoid Space: Moderate pachydermia edema Subglottic Space: not seen wee Neck Neck and Trachea: Midline trachea without mass or lesion Thyroid : No mass or nodularity Lymphatics: No lymphadenopathy  Procedure: Summary of Video-Laryngeal-Stroboscopy: post-XRT changes along epiglottis and left BOT, no masses or lesions, moderate post-cricoid edema, VF atrophy and mild thickening/edema of b/l VF, likely related to XRT, incomplete glottic closure, supraglottic compression. Mucosal wave is present but decreased.   Preoperative diagnosis: hoarseness/dysphagia  hx of left BOT SCCa, post-XRT  Postoperative diagnosis:   same + GERD LPR  Procedure: Flexible fiberoptic laryngoscopy with stroboscopy (68420)  Surgeon: Elena Larry, MD  Anesthesia: Topical lidocaine  and Afrin  Complications: None  Condition is stable throughout exam  Indications and consent:   The patient presents to the clinic with hoarseness. All the risks, benefits, and potential complications were reviewed with the patient preoperatively and informed verbal consent was obtained.  Procedure: The patient was seated upright in the exam chair.   Topical lidocaine  and Afrin were applied to the nasal cavity. After  adequate anesthesia had occurred, the flexible telescope was passed into the nasal cavity. The nasopharynx was patent without mass or lesion. The scope was passed behind the soft palate and directed toward the base of tongue. The base of tongue was visualized and was symmetric with no apparent masses or abnormal appearing tissue. There were no signs of a mass or pooling of secretions in the piriform sinuses. The supraglottic structures were normal.  The true vocal cords are mobile. The medial edges were straight. Closure was incomplete with supraglottic compression. Periodicity present. The mucosal wave and amplitude were decreased. There is moderate interarytenoid pachydermia and post cricoid edema. The mucosa appears without lesions.   The laryngoscope was then slowly withdrawn and the patient tolerated the procedure well. There were no complications or blood loss.  Studies Reviewed: CT neck FINDINGS: Pharynx and larynx: Unchanged architectural distortion of the oropharynx with symmetric epiglottic thickening that is considered treatment related. No recurrent or metachronous mass is noted. Evidence of prior tracheostomy.   Salivary glands: Post treatment atrophy especially of the submandibular glands.   Thyroid : Normal   Lymph nodes: None enlarged or abnormal density.   Vascular:  Porta catheter on the right.   Limited intracranial: Negative   Visualized orbits: Negative   Mastoids and visualized paranasal sinuses: Chronic generalized sinus opacification with patchy involvement especially of the ethmoids. Negative orbits.   Skeleton: No acute finding   Upper chest: Reported separately   IMPRESSION: Stable post treatment neck.  No evidence of recurrent disease.  CT CHEST WITH CONTRAST   TECHNIQUE: Multidetector CT imaging of the chest was performed during intravenous contrast administration.   RADIATION DOSE REDUCTION: This exam was performed according to the departmental  dose-optimization program which includes automated exposure control, adjustment of the mA and/or kV according to patient size and/or use of iterative reconstruction technique.   CONTRAST:  OMNIPAQUE  IOHEXOL  300 MG/ML  SOLN   COMPARISON:  CT 11/21/2021 and older.  PET-CT 06/17/2021 and older.   FINDINGS: Cardiovascular: Right upper chest port. Heart is nonenlarged. No pericardial effusion. The thoracic aorta has a normal course and caliber.   Mediastinum/Nodes: No specific abnormal lymph node enlargement identified in the axillary regions, hilum or mediastinum. Normal caliber thoracic esophagus. Preserved thyroid  gland.   Lungs/Pleura: There is some linear opacity lung bases likely scar or atelectasis. No consolidation, pneumothorax or effusion.   Upper Abdomen: No acute abnormality.   Musculoskeletal: Scattered degenerative changes along the thoracic spine with some endplate osteophytes and multilevel disc height loss.   IMPRESSION: No acute cardiopulmonary disease.  Chest port.   No developing mass lesion, fluid collection or lymph node enlargement in the thorax.  MBS 01/28/23 Pt demonstrates a mild dysphagia following a history of base of tongue cancer with radiation therapy. Pt reports dry mouth, frequent coughing with liquids and sometimes solids, sensation of food clinging in throat and occasions of nasal regurgitation. He has not had pna or any overt choking episodes. Under fluoroscopy pt demonstrates delayed swallowing intiation (pyriform sinuses), loss of volume in the base of tongue and minimally visible epiglottis without inversion, decreased tension of the velum, reduced ROM of hyoid excursion and laryngeal elevation. Functionally these difference result in penetration before the swallow with partially open vesituble during the swallow. Also instances of trace lingual and base of tongue residue penetrating the airway post swallow. There is accumulation of silent  penetrate and trace silent aspiration. Nectar thick penetration does elicit a cough. Best strategy during testing was a super supraglottic swallow and extra swallow (take a sip, hold it, hold your breath, swallow, cough, swallow again). Pt also required a liquid wash with solids due to moderate base of tongue/vallcular residue. There were some instances of trace backflow from the PES to the pyriform sinuses. Esophageal sweep unremarkable, but esophagram after MBS is a complete assessment. Recommend pt continue foods and drink of choice, but advaice f/u with OP SLP to train strategy and resume HEP for weakness and ROM, education regarding pulmonary hygiene etc.   Esophagram 01/28/23 IMPRESSION: 1. Non silent aspiration with thin consistency barium. Please refer to dedicated speech swallow examination performed earlier same day. 2. Decreased esophageal motility. 3. No esophageal mucosal abnormality, mass or stricture 4. No hiatal hernia. 5. No gastroesophageal reflux was visualized or elicited.  Assessment/Plan: Encounter Diagnoses  Name Primary?   Dysphagia, unspecified type    Pharyngeal dysphagia Yes   Dysphonia    History of head and neck radiation    Chronic GERD    Chronic nasal congestion    Environmental and seasonal allergies    History of radiation to head and neck region     62 yoM  hx of L BOT SCCa p16(+), s/p cXRT 2022 required PEG during the course of treatment, removed in 2022, here for chronic long-standing dysphagia, with food getting caught in his throat, and at times solid food regurgitation. Denies coughing or choking with liquids. Consumes regular diet currently. Denies weight loss, denies fevers, aspiration PNA. Has raspy voice since XRT treatment, not concerned about projection or quality of his voice.   Chronic dysphonia and dysphagia s/p XRT, able to tolerate regular diet, without instances of aspiration PNA Videostrobe today - post-XRT changes along epiglottis and  left BOT, no masses or lesions, moderate post-cricoid edema, VF atrophy and mild thickening/edema of b/l VF, likely related to XRT, incomplete glottic closure, supraglottic compression. Mucosal wave is present but decreased.  - schedule swallow study (MBS and esophagram) - previously had swallow therapy, will consider another trial of swallow therapy  2. Chronic nasal congestion  - start Flonase  (nasal spray) and Zyrtec  - start nasal saline rinses   3. Chronic GERD  - start Pepcid  (Famotidine ) for reflux  - Take Reflux Gourmet (natural supplement available on [employer]) to help with symptoms of chronic throat irritation   Update 03/02/23 Chronic dysphonia and dysphagia s/p XRT for SCCa of the L BOT, s/p cXRT EOT 2022.   Assessment and Plan    Dysphagia Chronic dysphagia post-radiation therapy for head and neck cancer. Esophagram showed slight slowing of barium passage but no frank aspiration. Speech therapy assessment indicated minor penetration and trace aspiration. No significant weight loss or hx of pneumonia noted. Swallow function has not significantly worsened since post-treatment per report.  - refer for swallow therapy - Advised maintaining good oral hygiene and regular dental visits - Encouraged staying active and following speech therapy recommendations   Chronic Hoarseness  Discussed potential future consideration of vocal fold filler if hoarseness persists. - Repeat scope exam when he returns - Consider vocal fold injection augmentation in the future   Gastroesophageal Reflux Disease (GERD) GERD managed with omeprazole. No significant improvement with Pepcid . Discussed long-term side effects of omeprazole, including bone density reduction and increased risk of community-acquired pneumonia. Preference for Pepcid  due to lower risk profile. Discussed dietary modifications and the use of Reflux Gourmet as an alternative approach. - Continue Pepcid  20 mg daily for now - Recommend  trying Reflux Gourmet - Advise slowly weaning off Pepcid  if symptoms are controlled or get better  Follow-up - Schedule follow-up appointment in three months - Scope exam at next visit to reassess vocal fold condition.    Elena Larry, MD Otolaryngology [facility] Phone: [phone] Fax: [phone]    03/02/2023, 6:25 PM

## 2023-03-16 ENCOUNTER — Other Ambulatory Visit: Payer: Self-pay

## 2023-03-16 ENCOUNTER — Ambulatory Visit: Payer: 59 | Attending: Otolaryngology

## 2023-03-16 DIAGNOSIS — R490 Dysphonia: Secondary | ICD-10-CM | POA: Diagnosis present

## 2023-03-16 DIAGNOSIS — R1310 Dysphagia, unspecified: Secondary | ICD-10-CM | POA: Insufficient documentation

## 2023-03-16 DIAGNOSIS — R1313 Dysphagia, pharyngeal phase: Secondary | ICD-10-CM | POA: Insufficient documentation

## 2023-03-16 NOTE — Patient Instructions (Signed)
   SWALLOWING EXERCISES Do these 6 days/week until May 1st, then 2 days per week afterwards You can use 1-2 drops of liquid to help you swallow, if your mouth gets dry  [place] Swallow - swallow with your tongue sticking out - Stick tongue out past your lips and gently bite tongue with your teeth - Swallow, while holding your tongue with your teeth - Do at least 30 reps/day, in sets of 5-10       2.   Tongue press -Press your tongue as hard as you can on the roof of your mouth for 3 seconds  - Do at least 30 reps/day, in sets of 5-10

## 2023-03-16 NOTE — Therapy (Signed)
OUTPATIENT SPEECH LANGUAGE PATHOLOGY ONCOLOGY EVALUATION   Patient Name: Alexander [name] MRN: [id number] DOB:Nov 04, 1960, 63 y.o., male Today's Date: 03/16/2023  PCP: Sinclair Ship, MD REFERRING PROVIDER: Ashok Croon, MD  END OF SESSION:  End of Session - 03/16/23 1756     Visit Number 1    Number of Visits 13    Date for SLP Re-Evaluation 05/15/23    SLP Start Time 1405    SLP Stop Time  1446    SLP Time Calculation (min) 41 min    Activity Tolerance Patient tolerated treatment well             Past Medical History:  Diagnosis Date   Asthma    Cancer (HCC)    GERD (gastroesophageal reflux disease)    Sleep apnea    CPAP- does not wear   Past Surgical History:  Procedure Laterality Date   BRONCHIAL NEEDLE ASPIRATION BIOPSY  05/22/2020   Procedure: BRONCHIAL NEEDLE ASPIRATION BIOPSIES;  Surgeon: Josephine Igo, DO;  Location: [facility];  Service: Pulmonary;;   DIRECT LARYNGOSCOPY  05/09/2020   Procedure: DIRECT LARYNGOSCOPY;  Surgeon: [name], MD;  Location: [facility] OR;  Service: ENT;;   IR GASTROSTOMY TUBE MOD SED  09/13/2020   IR GASTROSTOMY TUBE REMOVAL  12/14/2020   IR IMAGING GUIDED PORT INSERTION  06/12/2020   IR REMOVAL TUN ACCESS W/ PORT W/O FL MOD SED  01/09/2023   MASS EXCISION N/A 05/09/2020   Procedure: EXCISION TONGUE MASS;  Surgeon: [name], MD;  Location: [facility] OR;  Service: ENT;  Laterality: N/A;   TONSILLECTOMY     TRACHEOSTOMY TUBE PLACEMENT  05/09/2020   Procedure: TRACHEOSTOMY;  Surgeon: [name], MD;  Location: [facility] OR;  Service: ENT;;   VIDEO BRONCHOSCOPY WITH ENDOBRONCHIAL ULTRASOUND N/A 05/22/2020   Procedure: VIDEO BRONCHOSCOPY WITH ENDOBRONCHIAL ULTRASOUND;  Surgeon: Josephine Igo, DO;  Location: [facility];  Service: Pulmonary;  Laterality: N/A;   Patient Active Problem List   Diagnosis Date Noted   Right knee pain 12/23/2022   Encounter for Medicare annual wellness exam 12/23/2022   Phobia of  dental procedure 08/06/2021   Chemotherapy-induced peripheral neuropathy (HCC) 05/28/2021   Encounter for dental examination and cleaning without abnormal findings 03/08/2021   Teeth missing 03/08/2021   Caries 03/08/2021   Defective dental restoration 03/08/2021   Excessive dental attrition 03/08/2021   Abfraction 03/08/2021   Diastema 03/08/2021   Malocclusion 03/08/2021   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   Incipient enamel caries 03/08/2021   Chronic periodontal disease 03/08/2021   Acquired lymphedema 01/11/2021   History of head and neck radiation 01/07/2021   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   Dysgeusia 01/07/2021   Accretions on teeth 01/07/2021   Pancytopenia, acquired (HCC) 12/07/2020   Dry mouth 12/07/2020   Leukopenia due to antineoplastic chemotherapy (HCC) 11/08/2020   Port-A-Cath in place 10/03/2020   Intractable hiccups 10/03/2020   Thrombocytopenia (HCC) 10/03/2020   Chemotherapy-induced fatigue 09/27/2020   Weight loss, unintentional 06/01/2020   Malnutrition of moderate degree 05/28/2020   GERD (gastroesophageal reflux disease)    Volume depletion    Hyperbilirubinemia    Hyponatremia    Mild protein-calorie malnutrition (HCC)    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 Patient completed definitive chemoradiation to his base of tongue and subcarinal lymph node in  September 2022. He has routine visits to ENT for check on status, last being stable, reporting the following: pt with postradiation changes noted in the neck and no palpable adenopathy. Patient endorses occasional aspiration, primarily with eating quickly. No recent history of pneumonia. He was prescribed Pepcid by laryngology, which he has not been taking as he states rationale for the medication was not prescribed to him and he does not have symptoms of GERD. he was advised to take this to improve symptoms. He had an updated CT neck with contrast on 12/12/2022, which did not reveal any evidence of recurrent disease. He saw Dr. Irene Pap on 11/06 for his dysphagia symptoms, and had video stroboscopy at that visit which demonstrated no evidence of masses or lesions, with moderate postcricoid edema, vocal fold atrophy and mild thickening and edema of bilateral vocal folds, secondary to XRT. Patient still experiences occasional symptoms of aspiration, which he states primarily occurs when he is eating quickly. He has not been treated for any recent episodes of pneumonia and his weight has been stable. MBS was completed 01/28/23.  PAIN:  Are you having pain? No  FALLS: Has patient fallen in last 6 months?  No  LIVING ENVIRONMENT: Lives with: lives with their spouse Lives in: House/apartment  PLOF:  Level of assistance: Independent with ADLs, Independent with IADLs  PATIENT GOALS: Improve swallowing  OBJECTIVE:  Note: Objective measures were completed at Evaluation unless otherwise noted. DIAGNOSTIC FINDINGS:  ENT -SOLDATOVA 03/02/23: Chronic dysphonia and dysphagia s/p XRT for SCCa of the L BOT, s/p cXRT EOT 2022.  Procedure: The patient was seated upright in the exam chair.   Topical lidocaine and Afrin were applied to the nasal cavity. After adequate anesthesia had occurred, the flexible telescope was passed into the nasal cavity. The nasopharynx was patent without mass or lesion. The scope was  passed behind the soft palate and directed toward the base of tongue. The base of tongue was visualized and was symmetric with no apparent masses or abnormal appearing tissue. There were no signs of a mass or pooling of secretions in the piriform sinuses. The supraglottic structures were normal.   The true vocal cords are mobile. The medial edges were straight. Closure was incomplete with supraglottic compression. Periodicity present. The mucosal wave and amplitude were decreased. There is moderate interarytenoid pachydermia and post cricoid edema. The mucosa appears without lesions.   The laryngoscope was then slowly withdrawn and the patient tolerated the procedure well. There were no complications or blood loss.  Assessment and Plan Dysphagia Chronic dysphagia post-radiation therapy for head and neck cancer. Esophagram showed slight slowing of barium passage but no frank aspiration. Speech therapy assessment indicated minor penetration and trace aspiration. No significant weight loss or hx of pneumonia noted. Swallow function has not significantly worsened since post-treatment per report.  - refer for swallow therapy - Advised maintaining good oral hygiene and regular dental visits - Encouraged staying active and following speech therapy recommendations   Chronic Hoarseness  Discussed potential future consideration of vocal fold filler if hoarseness persists. - Repeat scope exam  when he returns - Consider vocal fold injection augmentation in the future    Gastroesophageal Reflux Disease (GERD) GERD managed with omeprazole. No significant improvement with Pepcid. Discussed long-term side effects of omeprazole, including bone density reduction and increased risk of community-acquired pneumonia. Preference for Pepcid due to lower risk profile. Discussed dietary modifications and the use of Reflux Gourmet as an alternative approach. - Continue Pepcid 20 mg daily for now - Recommend trying Reflux  Gourmet - Advise slowly weaning off Pepcid if symptoms are controlled or get better   Follow-up - Schedule follow-up appointment in three months - Scope exam at next visit to reassess vocal fold condition. Indications and consent:   The patient presents to the clinic with hoarseness. All the risks, benefits, and potential complications were reviewed with the patient preoperatively and informed verbal consent was obtained.    INSTRUMENTAL SWALLOW STUDY FINDINGS (MBSS) Clinical Impression Objective swallow impairments: Pt demonstrates a mild dysphagia following a history of base of tongue cancer with radiation therapy. Pt reports dry mouth, frequent coughing with liquids and sometimes solids, sensation of food clinging in throat and occasions of nasal regurgitation. He has not had pna or any overt choking episodes. Under fluoroscopy pt demonstrates delayed swallowing intiation (pyriform sinuses), loss of volume in the base of tongue and minimally visible epiglottis without inversion, decreased tension of the velum, reduced ROM of hyoid excursion and laryngeal elevation. Functionally these difference result in penetration before the swallow with partially open vesituble during the swallow. Also instances of trace lingual and base of tongue residue penetrating the airway post swallow. There is accumulation of silent penetrate and trace silent aspiration. Nectar thick penetration does elicit a cough. Best strategy during testing was a super supraglottic swallow and extra swallow (take a sip, hold it, hold your breath, swallow, cough, swallow again). Pt also required a liquid wash with solids due to moderate base of tongue/vallcular residue. There were some instances of trace backflow from the PES to the pyriform sinuses. Esophageal sweep unremarkable, but esophagram after MBS is a complete assessment. Recommend pt continue foods and drink of choice, but advaice f/u with OP SLP to train strategy and resume HEP  for weakness and ROM, education regarding pulmonary hygiene etc. Swallowing Evaluation Recommendations: Recommendations: PO diet PO Diet Recommendation: Regular; Thin liquids (Level 0) Liquid Administration via: Cup; Straw Medication Administration: Whole meds with liquid Supervision: Patient able to self-feed Swallowing strategies: Slow rate; Small bites/sips; Follow solids with liquids; Hold breath during swallow, followed by a cough (super supraglottic swallow)   COGNITION: Overall cognitive status: Within functional limits for tasks assessed  LANGUAGE: Receptive and Expressive language appeared WNL.  ORAL MOTOR EXAMINATION: Overall status: WFL Comments: Strength and ROM of lingual musculature (non swallowing tasks) appears WFL/WNL  MOTOR SPEECH: Overall motor speech: Appears intact Respiration: thoracic breathing and diaphragmatic/abdominal breathing Phonation:  harsher quality present Resonance: WFL Articulation: Appears intact Intelligibility: Intelligible  SUBJECTIVE DYSPHAGIA REPORTS:  Date of onset: Fall 2024 Reported symptoms: coughing with liquids  Current diet: regular and thin liquids  Co-morbid voice changes: Yes  CLINICAL SWALLOW ASSESSMENT:   Dentition: adequate natural dentition Vocal quality at baseline: harsh Patient directly observed with POs: Yes: regular and thin liquids  Feeding: able to feed self Liquids provided by:  bottle Oral phase signs and symptoms:  none noted Pharyngeal phase signs and symptoms:  none noted today, but pt indicated silent aspirator on MBS  Rockne told SLP he had not been performing strategies from [facility] after  that study.   PATIENT REPORTED OUTCOME MEASURES (PROM): EAT-10: administered in first session.                                                                                                                            TREATMENT DATE:  03/16/23 (eval): Research states the risk for dysphagia increases due to radiation  and/or chemotherapy treatment due to a variety of factors, so SLP re-educated the pt that his is likely the reason for reduced/limited swallow safety/ability after rad tx. SLP informed pt why this would be detrimental to their swallowing status and to their pulmonary health. Pt demonstrated understanding of these things to SLP. SLP then initiated an individualized HEP for pt involving oral and pharyngeal strengthening and ROM based upon the results from the [facility] in December 2024. Pt was provided with Masako and tongue press today and will be provided more exercises next time. Pt was re-instructed how to perform each of these exercises, including SLP demonstration. After SLP demonstration, pt return demonstrated and SLP ensured pt performance was correct. Pt required rare min cues faded to modified independent for Masako, was independent with model with tongue press, and was told he will have 2-3 other exercises added next session. Pt was instructed to complete this program 6/7 days/week, at least 30 reps/day for 12 weeks, and then x2 a week after that, indefinitely.   PATIENT EDUCATION: Education details: late effects head/neck radiation on swallow function and HEP procedure Person educated: Patient Education method: Explanation, Demonstration, Verbal cues, and Handouts Education comprehension: verbalized understanding, returned demonstration, verbal cues required, and needs further education   ASSESSMENT:  CLINICAL IMPRESSION: Patient is a 63 y.o. M who was seen today for assessment of swallowing after completing chemoradiation therapy in 2022. Today pt ate cereal bar and drank thin liquids without overt s/s oral or pharyngeal difficulty. At this time pt swallowing is deemed WNL/WFL with these POs given he uses suggested compensations above in MBS results. Today he needed occasional mod cues faded to occasional min cues for super supraglottic but was independent with other precautions. There are no  overt s/s aspiration PNA observed by SLP nor any reported by pt at this time. Pt will cont to need to be seen by SLP in order to assess safety of PO intake, assess the need for recommending any objective swallow assessment, and ensuring pt is correctly completing the individualized HEP.  OBJECTIVE IMPAIRMENTS: include voice disorder and dysphagia. These impairments are limiting patient from ADLs/IADLs, effectively communicating at home and in community, and safety when swallowing. Factors affecting potential to achieve goals and functional outcome are severity of impairments. Patient will benefit from skilled SLP services to address above impairments and improve overall function.  REHAB POTENTIAL: Good   GOALS: Goals reviewed with patient? Yes  SHORT TERM GOALS: Target date: 04/17/23  Pt will perform HEP with modified independence in two sessions Baseline: Goal status: INITIAL  2.  Pt will perform swallow precautions  with modified independence in two sessions Baseline:  Goal status: INITIAL  3.  Pt will tell SLP 3 overt s/sx aspiration PNA with mod I Baseline:  Goal status: INITIAL   LONG TERM GOALS: Target date: 05/15/23  Pt will improve PROM from initial administration Baseline:  Goal status: INITIAL  Pt will perform HEP with independence in two sessions Baseline: Goal status: INITIAL  2.  Pt will perform swallow precautions with independence in two sessions Baseline:  Goal status: INITIAL  4.  Pt will demo understanding of good/optimal vocal hygiene with mod I in two sessions Baseline:  Goal status: INITIAL   PLAN:  SLP FREQUENCY: 1-2x/week  SLP DURATION: 8 weeks  PLANNED INTERVENTIONS: Aspiration precaution training, Pharyngeal strengthening exercises, Diet toleration management , Trials of upgraded texture/liquids, SLP instruction and feedback, Compensatory strategies, Patient/family education, [phone] Treatment of speech (30 or 45 min) , and [id number] Treatment of  swallowing function    Jaeli Grubb, CCC-SLP 03/16/2023, 5:58 PM

## 2023-03-30 LAB — HM COLONOSCOPY

## 2023-04-01 ENCOUNTER — Ambulatory Visit: Payer: 59

## 2023-04-06 ENCOUNTER — Ambulatory Visit: Payer: 59 | Attending: Otolaryngology

## 2023-04-06 DIAGNOSIS — R1313 Dysphagia, pharyngeal phase: Secondary | ICD-10-CM | POA: Insufficient documentation

## 2023-04-06 NOTE — Therapy (Addendum)
OUTPATIENT SPEECH LANGUAGE PATHOLOGY ONCOLOGY TREATMENT   Patient Name: Alexander [name] MRN: [id number] DOB:01/13/1961, 63 y.o., male Today's Date: 04/06/2023  PCP: Alexander Ship, [name] REFERRING PROVIDER: Ashok Croon, [name]  END OF SESSION:  End of Session - 04/06/23 0811     Visit Number 2    Number of Visits 13    Date for SLP Re-Evaluation 05/15/23    SLP Start Time 0807    SLP Stop Time  0840    SLP Time Calculation (min) 33 min    Activity Tolerance Patient tolerated treatment well             Past Medical History:  Diagnosis Date   Asthma    Cancer (HCC)    GERD (gastroesophageal reflux disease)    Sleep apnea    CPAP- does not wear   Past Surgical History:  Procedure Laterality Date   BRONCHIAL NEEDLE ASPIRATION BIOPSY  05/22/2020   Procedure: BRONCHIAL NEEDLE ASPIRATION BIOPSIES;  Surgeon: [name];  Location: [facility];  Service: Pulmonary;;   DIRECT LARYNGOSCOPY  05/09/2020   Procedure: DIRECT LARYNGOSCOPY;  Surgeon: [name];  Location: [facility] OR;  Service: ENT;;   IR GASTROSTOMY TUBE MOD SED  09/13/2020   IR GASTROSTOMY TUBE REMOVAL  12/14/2020   IR IMAGING GUIDED PORT INSERTION  06/12/2020   IR REMOVAL TUN ACCESS W/ PORT W/O FL MOD SED  01/09/2023   MASS EXCISION N/A 05/09/2020   Procedure: EXCISION TONGUE MASS;  Surgeon: [name];  [name];  Service: ENT;  Laterality: N/A;   TONSILLECTOMY     TRACHEOSTOMY TUBE PLACEMENT  05/09/2020   Procedure: TRACHEOSTOMY;  Surgeon: [name];  Location: [name];  Service: ENT;;   VIDEO BRONCHOSCOPY WITH ENDOBRONCHIAL ULTRASOUND N/A 05/22/2020   Procedure: VIDEO BRONCHOSCOPY WITH ENDOBRONCHIAL ULTRASOUND;  Surgeon: [name];  Location: [facility];  Service: Pulmonary;  Laterality: N/A;   Patient Active Problem List   Diagnosis Date Noted   Right knee pain 12/23/2022   Encounter for Medicare annual wellness exam 12/23/2022   Phobia of  dental procedure 08/06/2021   Chemotherapy-induced peripheral neuropathy (HCC) 05/28/2021   Encounter for dental examination and cleaning without abnormal findings 03/08/2021   Teeth missing 03/08/2021   Caries 03/08/2021   Defective dental restoration 03/08/2021   Excessive dental attrition 03/08/2021   Abfraction 03/08/2021   Diastema 03/08/2021   Malocclusion 03/08/2021   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   Incipient enamel caries 03/08/2021   Chronic periodontal disease 03/08/2021   Acquired lymphedema 01/11/2021   History of head and neck radiation 01/07/2021   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   Dysgeusia 01/07/2021   Accretions on teeth 01/07/2021   Pancytopenia, acquired (HCC) 12/07/2020   Dry mouth 12/07/2020   Leukopenia due to antineoplastic chemotherapy (HCC) 11/08/2020   Port-A-Cath in place 10/03/2020   Intractable hiccups 10/03/2020   Thrombocytopenia (HCC) 10/03/2020   Chemotherapy-induced fatigue 09/27/2020   Weight loss, unintentional 06/01/2020   Malnutrition of moderate degree 05/28/2020   GERD (gastroesophageal reflux disease)    Volume depletion    Hyperbilirubinemia    Hyponatremia    Mild protein-calorie malnutrition (HCC)    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 Patient completed definitive chemoradiation to his base of tongue and subcarinal lymph node in September  2022. He has routine visits to ENT for check on status, last being stable, reporting the following: pt with postradiation changes noted in the neck and no palpable adenopathy. Patient endorses occasional aspiration, primarily with eating quickly. No recent history of pneumonia. He was prescribed Pepcid by laryngology, which he has not been taking as he states rationale for the medication was not prescribed to him and he does not have symptoms of GERD. he was advised to take this to improve symptoms. He had an updated CT neck with contrast on 12/12/2022, which did not reveal any evidence of recurrent disease. He saw Dr. Irene [name] on 11/06 for his dysphagia symptoms, and had video stroboscopy at that visit which demonstrated no evidence of masses or lesions, with moderate postcricoid edema, vocal fold atrophy and mild thickening and edema of bilateral vocal folds, secondary to XRT. Patient still experiences occasional symptoms of aspiration, which he states primarily occurs when he is eating quickly. He has not been treated for any recent episodes of pneumonia and his weight has been stable. MBS was completed 01/28/23.  PAIN:  Are you having pain? No  FALLS: Has patient fallen in last 6 months?  No  LIVING ENVIRONMENT: Lives with: lives with their spouse Lives in: House/apartment  PLOF:  Level of assistance: Independent with ADLs, Independent with IADLs  PATIENT GOALS: Improve swallowing  OBJECTIVE:  Note: Objective measures were completed at Evaluation unless otherwise noted. DIAGNOSTIC FINDINGS:  ENT -SOLDATOVA 03/02/23: Chronic dysphonia and dysphagia s/p XRT for SCCa of the L BOT, s/p cXRT EOT 2022.  Procedure: The patient was seated upright in the exam chair.   Topical lidocaine and Afrin were applied to the nasal cavity. After adequate anesthesia had occurred, the flexible telescope was passed into the nasal cavity. The nasopharynx was patent without mass or lesion. The scope was passed  behind the soft palate and directed toward the base of tongue. The base of tongue was visualized and was symmetric with no apparent masses or abnormal appearing tissue. There were no signs of a mass or pooling of secretions in the piriform sinuses. The supraglottic structures were normal.   The true vocal cords are mobile. The medial edges were straight. Closure was incomplete with supraglottic compression. Periodicity present. The mucosal wave and amplitude were decreased. There is moderate interarytenoid pachydermia and post cricoid edema. The mucosa appears without lesions.   The laryngoscope was then slowly withdrawn and the patient tolerated the procedure well. There were no complications or blood loss.  Assessment and Plan Dysphagia Chronic dysphagia post-radiation therapy for head and neck cancer. Esophagram showed slight slowing of barium passage but no frank aspiration. Speech therapy assessment indicated minor penetration and trace aspiration. No significant weight loss or hx of pneumonia noted. Swallow function has not significantly worsened since post-treatment per report.  - refer for swallow therapy - Advised maintaining good oral hygiene and regular dental visits - Encouraged staying active and following speech therapy recommendations   Chronic Hoarseness  Discussed potential future consideration of vocal fold filler if hoarseness persists. - Repeat  scope exam when he returns - Consider vocal fold injection augmentation in the future    Gastroesophageal Reflux Disease (GERD) GERD managed with omeprazole. No significant improvement with Pepcid. Discussed long-term side effects of omeprazole, including bone density reduction and increased risk of community-acquired pneumonia. Preference for Pepcid due to lower risk profile. Discussed dietary modifications and the use of Reflux Gourmet as an alternative approach. - Continue Pepcid 20 mg daily for now - Recommend trying Reflux  Gourmet - Advise slowly weaning off Pepcid if symptoms are controlled or get better   Follow-up - Schedule follow-up appointment in three months - Scope exam at next visit to reassess vocal fold condition. Indications and consent:   The patient presents to the clinic with hoarseness. All the risks, benefits, and potential complications were reviewed with the patient preoperatively and informed verbal consent was obtained.    INSTRUMENTAL SWALLOW STUDY FINDINGS (MBSS) Clinical Impression Objective swallow impairments: Pt demonstrates a mild dysphagia following a history of base of tongue cancer with radiation therapy. Pt reports dry mouth, frequent coughing with liquids and sometimes solids, sensation of food clinging in throat and occasions of nasal regurgitation. He has not had pna or any overt choking episodes. Under fluoroscopy pt demonstrates delayed swallowing intiation (pyriform sinuses), loss of volume in the base of tongue and minimally visible epiglottis without inversion, decreased tension of the velum, reduced ROM of hyoid excursion and laryngeal elevation. Functionally these difference result in penetration before the swallow with partially open vesituble during the swallow. Also instances of trace lingual and base of tongue residue penetrating the airway post swallow. There is accumulation of silent penetrate and trace silent aspiration. Nectar thick penetration does elicit a cough. Best strategy during testing was a super supraglottic swallow and extra swallow (take a sip, hold it, hold your breath, swallow, cough, swallow again). Pt also required a liquid wash with solids due to moderate base of tongue/vallcular residue. There were some instances of trace backflow from the PES to the pyriform sinuses. Esophageal sweep unremarkable, but esophagram after MBS is a complete assessment. Recommend pt continue foods and drink of choice, but advaice f/u with OP SLP to train strategy and resume HEP  for weakness and ROM, education regarding pulmonary hygiene etc. Swallowing Evaluation Recommendations: Recommendations: PO diet PO Diet Recommendation: Regular; Thin liquids (Level 0) Liquid Administration via: Cup; Straw Medication Administration: Whole meds with liquid Supervision: Patient able to self-feed Swallowing strategies: Slow rate; Small bites/sips; Follow solids with liquids; Hold breath during swallow, followed by a cough (super supraglottic swallow)     PATIENT REPORTED OUTCOME MEASURES (PROM): EAT-10: administered 04/06/23 , with pt scoring himself 15/40 with lower scores indicating better QOL in light of swallowing ability.                                                                                                                            TREATMENT DATE:  04/06/23: Pt has been doing HEP intermittently but  not 6 days/week. Dicky reports Masako is more difficult due to xerostomia. SLP strongly reiterated HEP 6 days/week, 30 reps of each exercise. Today, pt req'd initial mod cues for tongue press as pt was pressing tip to roof of mouth, and initial min A for taking DROPS of liquid to achieve Masako safely. Pt independent by session end.  He ate peanut butter crackers and drank water with initial cues for taking and holding breath with supersupraglottic. Independent by session end. Pt without additional questions.   03/16/23 (eval): Research states the risk for dysphagia increases due to radiation and/or chemotherapy treatment due to a variety of factors, so SLP re-educated the pt that his is likely the reason for reduced/limited swallow safety/ability after rad tx. SLP informed pt why this would be detrimental to their swallowing status and to their pulmonary health. Pt demonstrated understanding of these things to SLP. SLP then initiated an individualized HEP for pt involving oral and pharyngeal strengthening and ROM based upon the results from the [facility] in December 2024. Pt was  provided with Masako and tongue press today and will be provided more exercises next time. Pt was re-instructed how to perform each of these exercises, including SLP demonstration. After SLP demonstration, pt return demonstrated and SLP ensured pt performance was correct. Pt required rare min cues faded to modified independent for Masako, was independent with model with tongue press, and was told he will have 2-3 other exercises added next session. Pt was instructed to complete this program 6/7 days/week, at least 30 reps/day for 12 weeks, and then x2 a week after that, indefinitely.   PATIENT EDUCATION: Education details: late effects head/neck radiation on swallow function and HEP procedure Person educated: Patient Education method: Explanation, Demonstration, Verbal cues, and Handouts Education comprehension: verbalized understanding, returned demonstration, verbal cues required, and needs further education   ASSESSMENT:  CLINICAL IMPRESSION: Patient is a 63 y.o. M who was seen today for treatment of swallowing after completing chemoradiation therapy in 2022. Today pt ate crackers/chips and drank thin liquids without overt s/s oral or pharyngeal difficulty using strategies from MBS. At this time pt swallowing is deemed WNL/WFL with these POs given he uses suggested compensations above in MBS results. See "treatment date" for today's date for more details. There are no overt s/s aspiration PNA observed by SLP nor any reported by pt at this time. Pt will cont to need to be seen by SLP in order to assess safety of PO intake, assess the need for recommending any objective swallow assessment, and ensuring pt is correctly completing the individualized HEP.  OBJECTIVE IMPAIRMENTS: include voice disorder and dysphagia. These impairments are limiting patient from ADLs/IADLs, effectively communicating at home and in community, and safety when swallowing. Factors affecting potential to achieve goals and  functional outcome are severity of impairments. Patient will benefit from skilled SLP services to address above impairments and improve overall function.  REHAB POTENTIAL: Good   GOALS: Goals reviewed with patient? Yes  SHORT TERM GOALS: Target date: 04/17/23  Pt will perform HEP with modified independence in two sessions Baseline: Goal status: INITIAL  2.  Pt will perform swallow precautions with modified independence in two sessions Baseline:  Goal status: INITIAL  3.  Pt will tell SLP 3 overt s/sx aspiration PNA with mod I Baseline:  Goal status: INITIAL   LONG TERM GOALS: Target date: 05/15/23  Pt will improve PROM from initial administration Baseline:  Goal status: INITIAL  Pt will perform HEP with independence in  two sessions Baseline: Goal status: INITIAL  2.  Pt will perform swallow precautions with independence in two sessions Baseline:  Goal status: INITIAL  4.  Pt will demo understanding of good/optimal vocal hygiene with mod I in two sessions Baseline:  Goal status: INITIAL   PLAN:  SLP FREQUENCY: 1-2x/week  SLP DURATION: 8 weeks  PLANNED INTERVENTIONS: Aspiration precaution training, Pharyngeal strengthening exercises, Diet toleration management , Trials of upgraded texture/liquids, SLP instruction and feedback, Compensatory strategies, Patient/family education, [phone] Treatment of speech (30 or 45 min) , and [id number] Treatment of swallowing function    Clent Damore, CCC-SLP 04/06/2023, 8:47 AM

## 2023-04-13 ENCOUNTER — Ambulatory Visit: Payer: 59

## 2023-04-13 DIAGNOSIS — R1313 Dysphagia, pharyngeal phase: Secondary | ICD-10-CM | POA: Diagnosis not present

## 2023-04-13 NOTE — Therapy (Signed)
 OUTPATIENT SPEECH LANGUAGE PATHOLOGY ONCOLOGY TREATMENT   Patient Name: Alexander [name] MRN: [id number] DOB:1960/10/10, 63 y.o., male Today's Date: 04/13/2023  PCP: Alexander Ship, MD REFERRING PROVIDER: Ashok Croon, MD  END OF SESSION:  End of Session - 04/13/23 0857     Visit Number 3    Number of Visits 13    Date for SLP Re-Evaluation 05/15/23    SLP Start Time 0805    SLP Stop Time  0835    SLP Time Calculation (min) 30 min    Activity Tolerance Patient tolerated treatment well              Past Medical History:  Diagnosis Date   Asthma    Cancer (HCC)    GERD (gastroesophageal reflux disease)    Sleep apnea    CPAP- does not wear   Past Surgical History:  Procedure Laterality Date   BRONCHIAL NEEDLE ASPIRATION BIOPSY  05/22/2020   Procedure: BRONCHIAL NEEDLE ASPIRATION BIOPSIES;  Surgeon: Alexander Igo, DO;  Location: [facility];  Service: Pulmonary;;   DIRECT LARYNGOSCOPY  05/09/2020   Procedure: DIRECT LARYNGOSCOPY;  Surgeon: Alexander Halon, MD;  Location: [facility] OR;  Service: ENT;;   IR GASTROSTOMY TUBE MOD SED  09/13/2020   IR GASTROSTOMY TUBE REMOVAL  12/14/2020   IR IMAGING GUIDED PORT INSERTION  06/12/2020   IR REMOVAL TUN ACCESS W/ PORT W/O FL MOD SED  01/09/2023   MASS EXCISION N/A 05/09/2020   Procedure: EXCISION TONGUE MASS;  Surgeon: Alexander Halon, MD;  Location: [facility] OR;  Service: ENT;  Laterality: N/A;   TONSILLECTOMY     TRACHEOSTOMY TUBE PLACEMENT  05/09/2020   Procedure: TRACHEOSTOMY;  Surgeon: Alexander Halon, MD;  Location: [facility] OR;  Service: ENT;;   VIDEO BRONCHOSCOPY WITH ENDOBRONCHIAL ULTRASOUND N/A 05/22/2020   Procedure: VIDEO BRONCHOSCOPY WITH ENDOBRONCHIAL ULTRASOUND;  Surgeon: Alexander Igo, DO;  Location: [facility];  Service: Pulmonary;  Laterality: N/A;   Patient Active Problem List   Diagnosis Date Noted   Right knee pain 12/23/2022   Encounter for Medicare annual wellness exam 12/23/2022   Phobia of  dental procedure 08/06/2021   Chemotherapy-induced peripheral neuropathy (HCC) 05/28/2021   Encounter for dental examination and cleaning without abnormal findings 03/08/2021   Teeth missing 03/08/2021   Caries 03/08/2021   Defective dental restoration 03/08/2021   Excessive dental attrition 03/08/2021   Abfraction 03/08/2021   Diastema 03/08/2021   Malocclusion 03/08/2021   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   Incipient enamel caries 03/08/2021   Chronic periodontal disease 03/08/2021   Acquired lymphedema 01/11/2021   History of head and neck radiation 01/07/2021   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   Dysgeusia 01/07/2021   Accretions on teeth 01/07/2021   Pancytopenia, acquired (HCC) 12/07/2020   Dry mouth 12/07/2020   Leukopenia due to antineoplastic chemotherapy (HCC) 11/08/2020   Port-A-Cath in place 10/03/2020   Intractable hiccups 10/03/2020   Thrombocytopenia (HCC) 10/03/2020   Chemotherapy-induced fatigue 09/27/2020   Weight loss, unintentional 06/01/2020   Malnutrition of moderate degree 05/28/2020   GERD (gastroesophageal reflux disease)    Volume depletion    Hyperbilirubinemia    Hyponatremia    Mild protein-calorie malnutrition (HCC)    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 Patient completed definitive  chemoradiation to his base of tongue and subcarinal lymph node in September 2022. He has routine visits to ENT for check on status, last being stable, reporting the following: pt with postradiation changes noted in the neck and no palpable adenopathy. Patient endorses occasional aspiration, primarily with eating quickly. No recent history of pneumonia. He was prescribed Pepcid by laryngology, which he has not been taking as he states rationale for the medication was not prescribed to him and he does not have symptoms of GERD. he was advised to take this to improve symptoms. He had an updated CT neck with contrast on 12/12/2022, which did not reveal any evidence of recurrent disease. He saw Dr. Irene [name] on 11/06 for his dysphagia symptoms, and had video stroboscopy at that visit which demonstrated no evidence of masses or lesions, with moderate postcricoid edema, vocal fold atrophy and mild thickening and edema of bilateral vocal folds, secondary to XRT. Patient still experiences occasional symptoms of aspiration, which he states primarily occurs when he is eating quickly. He has not been treated for any recent episodes of pneumonia and his weight has been stable. MBS was completed 01/28/23.  PAIN:  Are you having pain? No  FALLS: Has patient fallen in last 6 months?  No  PATIENT GOALS: Improve swallowing  OBJECTIVE:  Note: Objective measures were completed at Evaluation unless otherwise noted. DIAGNOSTIC FINDINGS:  ENT -SOLDATOVA 03/02/23: Chronic dysphonia and dysphagia s/p XRT for SCCa of the L BOT, s/p cXRT EOT 2022.  Procedure: The patient was seated upright in the exam chair.   Topical lidocaine and Afrin were applied to the nasal cavity. After adequate anesthesia had occurred, the flexible telescope was passed into the nasal cavity. The nasopharynx was patent without mass or lesion. The scope was passed behind the soft palate and directed toward the base of tongue. The base of tongue was  visualized and was symmetric with no apparent masses or abnormal appearing tissue. There were no signs of a mass or pooling of secretions in the piriform sinuses. The supraglottic structures were normal.   The true vocal cords are mobile. The medial edges were straight. Closure was incomplete with supraglottic compression. Periodicity present. The mucosal wave and amplitude were decreased. There is moderate interarytenoid pachydermia and post cricoid edema. The mucosa appears without lesions.   The laryngoscope was then slowly withdrawn and the patient tolerated the procedure well. There were no complications or blood loss.  Assessment and Plan Dysphagia Chronic dysphagia post-radiation therapy for head and neck cancer. Esophagram showed slight slowing of barium passage but no frank aspiration. Speech therapy assessment indicated minor penetration and trace aspiration. No significant weight loss or hx of pneumonia noted. Swallow function has not significantly worsened since post-treatment per report.  - refer for swallow therapy - Advised maintaining good oral hygiene and regular dental visits - Encouraged staying active and following speech therapy recommendations   Chronic Hoarseness  Discussed potential future consideration of vocal fold filler if hoarseness persists. - Repeat scope exam when he returns - Consider vocal fold injection  augmentation in the future    Gastroesophageal Reflux Disease (GERD) GERD managed with omeprazole. No significant improvement with Pepcid. Discussed long-term side effects of omeprazole, including bone density reduction and increased risk of community-acquired pneumonia. Preference for Pepcid due to lower risk profile. Discussed dietary modifications and the use of Reflux Gourmet as an alternative approach. - Continue Pepcid 20 mg daily for now - Recommend trying Reflux Gourmet - Advise slowly weaning off Pepcid if symptoms are controlled or get better    Follow-up - Schedule follow-up appointment in three months - Scope exam at next visit to reassess vocal fold condition. Indications and consent:   The patient presents to the clinic with hoarseness. All the risks, benefits, and potential complications were reviewed with the patient preoperatively and informed verbal consent was obtained.    INSTRUMENTAL SWALLOW STUDY FINDINGS (MBSS) Clinical Impression Objective swallow impairments: Pt demonstrates a mild dysphagia following a history of base of tongue cancer with radiation therapy. Pt reports dry mouth, frequent coughing with liquids and sometimes solids, sensation of food clinging in throat and occasions of nasal regurgitation. He has not had pna or any overt choking episodes. Under fluoroscopy pt demonstrates delayed swallowing intiation (pyriform sinuses), loss of volume in the base of tongue and minimally visible epiglottis without inversion, decreased tension of the velum, reduced ROM of hyoid excursion and laryngeal elevation. Functionally these difference result in penetration before the swallow with partially open vesituble during the swallow. Also instances of trace lingual and base of tongue residue penetrating the airway post swallow. There is accumulation of silent penetrate and trace silent aspiration. Nectar thick penetration does elicit a cough. Best strategy during testing was a super supraglottic swallow and extra swallow (take a sip, hold it, hold your breath, swallow, cough, swallow again). Pt also required a liquid wash with solids due to moderate base of tongue/vallcular residue. There were some instances of trace backflow from the PES to the pyriform sinuses. Esophageal sweep unremarkable, but esophagram after MBS is a complete assessment. Recommend pt continue foods and drink of choice, but advaice f/u with OP SLP to train strategy and resume HEP for weakness and ROM, education regarding pulmonary hygiene etc. Swallowing Evaluation  Recommendations: Recommendations: PO diet PO Diet Recommendation: Regular; Thin liquids (Level 0) Liquid Administration via: Cup; Straw Medication Administration: Whole meds with liquid Supervision: Patient able to self-feed Swallowing strategies: Slow rate; Small bites/sips; Follow solids with liquids; Hold breath during swallow, followed by a cough (super supraglottic swallow)     PATIENT REPORTED OUTCOME MEASURES (PROM): EAT-10: administered 04/06/23 , with pt scoring himself 15/40 with lower scores indicating better QOL in light of swallowing ability.                                                                                                                            TREATMENT DATE:  04/13/23: Needs s/sx aspiration PNA next visit. Pt has performed HEP 4 days since last session. SLP again  strongly reiterated at least 6 days/week, and 30 reps each exercise/day. Tongue press was independent; pt req'd rare min A for Masako for tongue protrusion. He again req'd initial cue for 1-2 drops of liquid for assistance triggering a swallow.  He ate peanut butter crackers with independence following strategies except for last bite of cracker took sip at the same time and coughed after 2 subsequent chews. SLP informed pt he should take 1-2 drops of liquid should he require it due to xerostomia and explained rationale for this to pt. Bertin demonstrated understanding.   04/06/23: Pt has been doing HEP intermittently but not 6 days/week. Silas reports Masako is more difficult due to xerostomia. SLP strongly reiterated HEP 6 days/week, 30 reps of each exercise. Today, pt req'd initial mod cues for tongue press as pt was pressing tip to roof of mouth, and initial min A for taking DROPS of liquid to achieve Masako safely. Pt independent by session end.  He ate peanut butter crackers and drank water with initial cues for taking and holding breath with supersupraglottic. Independent by session end. Pt without  additional questions.   03/16/23 (eval): Research states the risk for dysphagia increases due to radiation and/or chemotherapy treatment due to a variety of factors, so SLP re-educated the pt that his is likely the reason for reduced/limited swallow safety/ability after rad tx. SLP informed pt why this would be detrimental to their swallowing status and to their pulmonary health. Pt demonstrated understanding of these things to SLP. SLP then initiated an individualized HEP for pt involving oral and pharyngeal strengthening and ROM based upon the results from the [facility] in December 2024. Pt was provided with Masako and tongue press today and will be provided more exercises next time. Pt was re-instructed how to perform each of these exercises, including SLP demonstration. After SLP demonstration, pt return demonstrated and SLP ensured pt performance was correct. Pt required rare min cues faded to modified independent for Masako, was independent with model with tongue press, and was told he will have 2-3 other exercises added next session. Pt was instructed to complete this program 6/7 days/week, at least 30 reps/day for 12 weeks, and then x2 a week after that, indefinitely.   PATIENT EDUCATION: Education details: late effects head/neck radiation on swallow function and HEP procedure Person educated: Patient Education method: Explanation, Demonstration, Verbal cues, and Handouts Education comprehension: verbalized understanding, returned demonstration, verbal cues required, and needs further education   ASSESSMENT:  CLINICAL IMPRESSION: SLP extended date for STGs due to visit number. Patient is a 63 y.o. M who was seen today for treatment of swallowing after completing chemoradiation therapy in 2022. Today pt ate crackers/chips and drank thin liquids with coughing when attempting to mix liquids and solids. There were no other overt s/s oral or pharyngeal difficulty when using strategies from MBS. At this  time pt swallowing is deemed WNL/WFL with these POs given he uses suggested compensations above in MBS results. See "treatment date" for today's date for more details. There are no overt s/s aspiration PNA observed by SLP nor any reported by pt at this time. Pt will cont to need to be seen by SLP in order to assess safety of PO intake, assess the need for recommending any objective swallow assessment, and ensuring pt is correctly completing the individualized HEP.  OBJECTIVE IMPAIRMENTS: include voice disorder and dysphagia. These impairments are limiting patient from ADLs/IADLs, effectively communicating at home and in community, and safety when swallowing. Factors affecting potential  to achieve goals and functional outcome are severity of impairments. Patient will benefit from skilled SLP services to address above impairments and improve overall function.  REHAB POTENTIAL: Good   GOALS: Goals reviewed with patient? Yes  SHORT TERM GOALS: Target date: 04/17/23, 04/25/23 (due to visits)  Pt will perform HEP with modified independence in two sessions Baseline: Goal status: INITIAL  2.  Pt will perform swallow precautions with modified independence in two sessions Baseline:  Goal status: INITIAL  3.  Pt will tell SLP 3 overt s/sx aspiration PNA with mod I Baseline:  Goal status: INITIAL   LONG TERM GOALS: Target date: 05/15/23  Pt will improve PROM from initial administration Baseline:  Goal status: INITIAL  Pt will perform HEP with independence in two sessions Baseline: Goal status: INITIAL  2.  Pt will perform swallow precautions with independence in two sessions Baseline:  Goal status: INITIAL  4.  Pt will demo understanding of good/optimal vocal hygiene with mod I in two sessions Baseline:  Goal status: INITIAL   PLAN:  SLP FREQUENCY: 1-2x/week  SLP DURATION: 8 weeks  PLANNED INTERVENTIONS: Aspiration precaution training, Pharyngeal strengthening exercises, Diet  toleration management , Trials of upgraded texture/liquids, SLP instruction and feedback, Compensatory strategies, Patient/family education, [phone] Treatment of speech (30 or 45 min) , and [id number] Treatment of swallowing function    Marijose Curington, CCC-SLP 04/13/2023, 8:57 AM

## 2023-04-20 ENCOUNTER — Ambulatory Visit: Payer: 59 | Attending: Otolaryngology

## 2023-04-20 DIAGNOSIS — R1313 Dysphagia, pharyngeal phase: Secondary | ICD-10-CM | POA: Insufficient documentation

## 2023-04-20 DIAGNOSIS — R490 Dysphonia: Secondary | ICD-10-CM | POA: Insufficient documentation

## 2023-04-20 NOTE — Therapy (Signed)
 OUTPATIENT SPEECH LANGUAGE PATHOLOGY ONCOLOGY TREATMENT   Patient Name: Fields Oros MRN: [id number] DOB:01-02-1961, 63 y.o., male Today's Date: 04/20/2023  PCP: Sinclair Ship, MD REFERRING PROVIDER: Ashok Croon, MD  END OF SESSION:  End of Session - 04/20/23 0849     Visit Number 4    Number of Visits 13    Date for SLP Re-Evaluation 05/15/23    SLP Start Time 0804    SLP Stop Time  0836    SLP Time Calculation (min) 32 min    Activity Tolerance Patient tolerated treatment well               Past Medical History:  Diagnosis Date   Asthma    Cancer (HCC)    GERD (gastroesophageal reflux disease)    Sleep apnea    CPAP- does not wear   Past Surgical History:  Procedure Laterality Date   BRONCHIAL NEEDLE ASPIRATION BIOPSY  05/22/2020   Procedure: BRONCHIAL NEEDLE ASPIRATION BIOPSIES;  Surgeon: Josephine Igo, DO;  Location: [facility];  Service: Pulmonary;;   DIRECT LARYNGOSCOPY  05/09/2020   Procedure: DIRECT LARYNGOSCOPY;  Surgeon: [name], MD;  Location: [facility] OR;  Service: ENT;;   IR GASTROSTOMY TUBE MOD SED  09/13/2020   IR GASTROSTOMY TUBE REMOVAL  12/14/2020   IR IMAGING GUIDED PORT INSERTION  06/12/2020   IR REMOVAL TUN ACCESS W/ PORT W/O FL MOD SED  01/09/2023   MASS EXCISION N/A 05/09/2020   Procedure: EXCISION TONGUE MASS;  Surgeon: [name], MD;  Location: [facility] OR;  Service: ENT;  Laterality: N/A;   TONSILLECTOMY     TRACHEOSTOMY TUBE PLACEMENT  05/09/2020   Procedure: TRACHEOSTOMY;  Surgeon: [name], MD;  Location: [facility] OR;  Service: ENT;;   VIDEO BRONCHOSCOPY WITH ENDOBRONCHIAL ULTRASOUND N/A 05/22/2020   Procedure: VIDEO BRONCHOSCOPY WITH ENDOBRONCHIAL ULTRASOUND;  Surgeon: Josephine Igo, DO;  Location: [facility];  Service: Pulmonary;  Laterality: N/A;   Patient Active Problem List   Diagnosis Date Noted   Right knee pain 12/23/2022   Encounter for Medicare annual wellness exam 12/23/2022   Phobia of  dental procedure 08/06/2021   Chemotherapy-induced peripheral neuropathy (HCC) 05/28/2021   Encounter for dental examination and cleaning without abnormal findings 03/08/2021   Teeth missing 03/08/2021   Caries 03/08/2021   Defective dental restoration 03/08/2021   Excessive dental attrition 03/08/2021   Abfraction 03/08/2021   Diastema 03/08/2021   Malocclusion 03/08/2021   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   Incipient enamel caries 03/08/2021   Chronic periodontal disease 03/08/2021   Acquired lymphedema 01/11/2021   History of head and neck radiation 01/07/2021   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   Dysgeusia 01/07/2021   Accretions on teeth 01/07/2021   Pancytopenia, acquired (HCC) 12/07/2020   Dry mouth 12/07/2020   Leukopenia due to antineoplastic chemotherapy (HCC) 11/08/2020   Port-A-Cath in place 10/03/2020   Intractable hiccups 10/03/2020   Thrombocytopenia (HCC) 10/03/2020   Chemotherapy-induced fatigue 09/27/2020   Weight loss, unintentional 06/01/2020   Malnutrition of moderate degree 05/28/2020   GERD (gastroesophageal reflux disease)    Volume depletion    Hyperbilirubinemia    Hyponatremia    Mild protein-calorie malnutrition (HCC)    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 Patient completed definitive chemoradiation to his base of tongue and subcarinal lymph  node in September 2022. He has routine visits to ENT for check on status, last being stable, reporting the following: pt with postradiation changes noted in the neck and no palpable adenopathy. Patient endorses occasional aspiration, primarily with eating quickly. No recent history of pneumonia. He was prescribed Pepcid by laryngology, which he has not been taking as he states rationale for the medication was not prescribed to him and he does not have symptoms of GERD. he was advised to take this to improve symptoms. He had an updated CT neck with contrast on 12/12/2022, which did not reveal any evidence of recurrent disease. He saw Dr. Irene Pap on 11/06 for his dysphagia symptoms, and had video stroboscopy at that visit which demonstrated no evidence of masses or lesions, with moderate postcricoid edema, vocal fold atrophy and mild thickening and edema of bilateral vocal folds, secondary to XRT. Patient still experiences occasional symptoms of aspiration, which he states primarily occurs when he is eating quickly. He has not been treated for any recent episodes of pneumonia and his weight has been stable. MBS was completed 01/28/23.  PAIN:  Are you having pain? No  FALLS: Has patient fallen in last 6 months?  No  PATIENT GOALS: Improve swallowing  OBJECTIVE:  Note: Objective measures were completed at Evaluation unless otherwise noted. DIAGNOSTIC FINDINGS:  ENT -SOLDATOVA 03/02/23: Chronic dysphonia and dysphagia s/p XRT for SCCa of the L BOT, s/p cXRT EOT 2022.  Procedure: The patient was seated upright in the exam chair.   Topical lidocaine and Afrin were applied to the nasal cavity. After adequate anesthesia had occurred, the flexible telescope was passed into the nasal cavity. The nasopharynx was patent without mass or lesion. The scope was passed behind the soft palate and directed toward the base of tongue. The base of tongue was visualized and was symmetric with no apparent masses or abnormal  appearing tissue. There were no signs of a mass or pooling of secretions in the piriform sinuses. The supraglottic structures were normal.   The true vocal cords are mobile. The medial edges were straight. Closure was incomplete with supraglottic compression. Periodicity present. The mucosal wave and amplitude were decreased. There is moderate interarytenoid pachydermia and post cricoid edema. The mucosa appears without lesions.   The laryngoscope was then slowly withdrawn and the patient tolerated the procedure well. There were no complications or blood loss.  Assessment and Plan Dysphagia Chronic dysphagia post-radiation therapy for head and neck cancer. Esophagram showed slight slowing of barium passage but no frank aspiration. Speech therapy assessment indicated minor penetration and trace aspiration. No significant weight loss or hx of pneumonia noted. Swallow function has not significantly worsened since post-treatment per report.  - refer for swallow therapy - Advised maintaining good oral hygiene and regular dental visits - Encouraged staying active and following speech therapy recommendations   Chronic Hoarseness  Discussed potential future consideration of vocal fold filler if hoarseness persists. - Repeat scope exam when he returns - Consider vocal fold injection augmentation in the future    Gastroesophageal Reflux Disease (GERD) GERD  managed with omeprazole. No significant improvement with Pepcid. Discussed long-term side effects of omeprazole, including bone density reduction and increased risk of community-acquired pneumonia. Preference for Pepcid due to lower risk profile. Discussed dietary modifications and the use of Reflux Gourmet as an alternative approach. - Continue Pepcid 20 mg daily for now - Recommend trying Reflux Gourmet - Advise slowly weaning off Pepcid if symptoms are controlled or get better   Follow-up - Schedule follow-up appointment in three months - Scope  exam at next visit to reassess vocal fold condition. Indications and consent:   The patient presents to the clinic with hoarseness. All the risks, benefits, and potential complications were reviewed with the patient preoperatively and informed verbal consent was obtained.    INSTRUMENTAL SWALLOW STUDY FINDINGS (MBSS) Clinical Impression Objective swallow impairments: Pt demonstrates a mild dysphagia following a history of base of tongue cancer with radiation therapy. Pt reports dry mouth, frequent coughing with liquids and sometimes solids, sensation of food clinging in throat and occasions of nasal regurgitation. He has not had pna or any overt choking episodes. Under fluoroscopy pt demonstrates delayed swallowing intiation (pyriform sinuses), loss of volume in the base of tongue and minimally visible epiglottis without inversion, decreased tension of the velum, reduced ROM of hyoid excursion and laryngeal elevation. Functionally these difference result in penetration before the swallow with partially open vesituble during the swallow. Also instances of trace lingual and base of tongue residue penetrating the airway post swallow. There is accumulation of silent penetrate and trace silent aspiration. Nectar thick penetration does elicit a cough. Best strategy during testing was a super supraglottic swallow and extra swallow (take a sip, hold it, hold your breath, swallow, cough, swallow again). Pt also required a liquid wash with solids due to moderate base of tongue/vallcular residue. There were some instances of trace backflow from the PES to the pyriform sinuses. Esophageal sweep unremarkable, but esophagram after MBS is a complete assessment. Recommend pt continue foods and drink of choice, but advaice f/u with OP SLP to train strategy and resume HEP for weakness and ROM, education regarding pulmonary hygiene etc. Swallowing Evaluation Recommendations: Recommendations: PO diet PO Diet Recommendation:  Regular; Thin liquids (Level 0) Liquid Administration via: Cup; Straw Medication Administration: Whole meds with liquid Supervision: Patient able to self-feed Swallowing strategies: Slow rate; Small bites/sips; Follow solids with liquids; Hold breath during swallow, followed by a cough (super supraglottic swallow)     PATIENT REPORTED OUTCOME MEASURES (PROM): EAT-10: administered 04/06/23 , with pt scoring himself 15/40 with lower scores indicating better QOL in light of swallowing ability.                                                                                                                            TREATMENT DATE:  04/20/23: S/Sx aspiration PNA provided today. Pt told SLP 3 with modified independence. With precautions (fig bar and water) pt was independent. No overt s/sx difficulty today with POs. With  HEP, pt was independent.  If things look the same next week he could decr frequency to once every other week.   04/13/23: Needs s/sx aspiration PNA next visit. Pt has performed HEP 4 days since last session. SLP again strongly reiterated at least 6 days/week, and 30 reps each exercise/day. Tongue press was independent; pt req'd rare min A for Masako for tongue protrusion. He again req'd initial cue for 1-2 drops of liquid for assistance triggering a swallow.  He ate peanut butter crackers with independence following strategies except for last bite of cracker took sip at the same time and coughed after 2 subsequent chews. SLP informed pt he should take 1-2 drops of liquid should he require it due to xerostomia and explained rationale for this to pt. Ferlando demonstrated understanding.   04/06/23: Pt has been doing HEP intermittently but not 6 days/week. Timarion reports Masako is more difficult due to xerostomia. SLP strongly reiterated HEP 6 days/week, 30 reps of each exercise. Today, pt req'd initial mod cues for tongue press as pt was pressing tip to roof of mouth, and initial min A for taking  DROPS of liquid to achieve Masako safely. Pt independent by session end.  He ate peanut butter crackers and drank water with initial cues for taking and holding breath with supersupraglottic. Independent by session end. Pt without additional questions.   03/16/23 (eval): Research states the risk for dysphagia increases due to radiation and/or chemotherapy treatment due to a variety of factors, so SLP re-educated the pt that his is likely the reason for reduced/limited swallow safety/ability after rad tx. SLP informed pt why this would be detrimental to their swallowing status and to their pulmonary health. Pt demonstrated understanding of these things to SLP. SLP then initiated an individualized HEP for pt involving oral and pharyngeal strengthening and ROM based upon the results from the [facility] in December 2024. Pt was provided with Masako and tongue press today and will be provided more exercises next time. Pt was re-instructed how to perform each of these exercises, including SLP demonstration. After SLP demonstration, pt return demonstrated and SLP ensured pt performance was correct. Pt required rare min cues faded to modified independent for Masako, was independent with model with tongue press, and was told he will have 2-3 other exercises added next session. Pt was instructed to complete this program 6/7 days/week, at least 30 reps/day for 12 weeks, and then x2 a week after that, indefinitely.   PATIENT EDUCATION: Education details: late effects head/neck radiation on swallow function and HEP procedure Person educated: Patient Education method: Explanation, Demonstration, Verbal cues, and Handouts Education comprehension: verbalized understanding, returned demonstration, verbal cues required, and needs further education   ASSESSMENT:  CLINICAL IMPRESSION: Patient is a 63 y.o. M who was seen today for treatment of swallowing after completing chemoradiation therapy in 2022. Today pt ate fig bar and  drank thin liquids with coughing as req'd for swallow precautions. There were no other overt s/s oral or pharyngeal difficulty when using strategies from MBS. At this time pt swallowing appears to continue as WNL/WFL with these POs given he uses suggested compensations above in MBS results. See "treatment date" for today's date for more details. There are no overt s/s aspiration PNA observed by SLP nor any reported by pt at this time. Pt will cont to need to be seen by SLP in order to assess safety of PO intake, assess the need for recommending any objective swallow assessment, and ensuring pt is correctly  completing the individualized HEP. If things look the same next visit he could decr frequency to once every other week.   OBJECTIVE IMPAIRMENTS: include voice disorder and dysphagia. These impairments are limiting patient from ADLs/IADLs, effectively communicating at home and in community, and safety when swallowing. Factors affecting potential to achieve goals and functional outcome are severity of impairments. Patient will benefit from skilled SLP services to address above impairments and improve overall function.  REHAB POTENTIAL: Good   GOALS: Goals reviewed with patient? Yes  SHORT TERM GOALS: Target date: 04/17/23, 04/25/23 (due to visits)  Pt will perform HEP with modified independence in two sessions Baseline: 04/20/23 Goal status: INITIAL  2.  Pt will perform swallow precautions with modified independence in two sessions Baseline: 04/20/23 Goal status: INITIAL  3.  Pt will tell SLP 3 overt s/sx aspiration PNA with mod I Baseline:  Goal status: met   LONG TERM GOALS: Target date: 05/15/23  Pt will improve PROM from initial administration Baseline:  Goal status: INITIAL  Pt will perform HEP with independence in two sessions Baseline: Goal status: INITIAL  2.  Pt will perform swallow precautions with independence in two sessions Baseline:  Goal status: INITIAL  4.  Pt will  demo understanding of good/optimal vocal hygiene with mod I in two sessions Baseline:  Goal status: INITIAL   PLAN:  SLP FREQUENCY: 1-2x/week  SLP DURATION: 8 weeks  PLANNED INTERVENTIONS: Aspiration precaution training, Pharyngeal strengthening exercises, Diet toleration management , Trials of upgraded texture/liquids, SLP instruction and feedback, Compensatory strategies, Patient/family education, [phone] Treatment of speech (30 or 45 min) , and [id number] Treatment of swallowing function    Leela Vanbrocklin, CCC-SLP 04/20/2023, 8:49 AM

## 2023-04-20 NOTE — Patient Instructions (Signed)
   Signs of Aspiration Pneumonia   Chest pain/tightness Fever (can be low grade) Cough  With foul-smelling phlegm (sputum) With sputum containing pus or blood With greenish sputum Fatigue  Shortness of breath  Wheezing   **IF YOU HAVE THESE SIGNS, CONTACT YOUR DOCTOR OR GO TO THE EMERGENCY DEPARTMENT OR URGENT CARE AS SOON AS POSSIBLE**

## 2023-04-21 DIAGNOSIS — R490 Dysphonia: Secondary | ICD-10-CM | POA: Diagnosis present

## 2023-04-21 DIAGNOSIS — R1313 Dysphagia, pharyngeal phase: Secondary | ICD-10-CM | POA: Diagnosis present

## 2023-04-23 ENCOUNTER — Encounter (HOSPITAL_BASED_OUTPATIENT_CLINIC_OR_DEPARTMENT_OTHER): Payer: Self-pay | Admitting: *Deleted

## 2023-04-29 ENCOUNTER — Ambulatory Visit: Payer: 59

## 2023-04-29 DIAGNOSIS — R490 Dysphonia: Secondary | ICD-10-CM | POA: Diagnosis not present

## 2023-04-29 DIAGNOSIS — R1313 Dysphagia, pharyngeal phase: Secondary | ICD-10-CM | POA: Diagnosis not present

## 2023-04-29 NOTE — Therapy (Signed)
 OUTPATIENT SPEECH LANGUAGE PATHOLOGY ONCOLOGY TREATMENT/RECERTIFICATION   Patient Name: Alexander [name] MRN: [id number] DOB:05-Aug-1960, 63 y.o., male Today's Date: 04/29/2023  PCP: Sinclair Ship, MD REFERRING PROVIDER: Ashok Croon, MD  END OF SESSION:  End of Session - 04/29/23 0839     Visit Number 5    Number of Visits 13    Date for SLP Re-Evaluation 06/28/23    SLP Start Time 0805    SLP Stop Time  0836    SLP Time Calculation (min) 31 min    Activity Tolerance Patient tolerated treatment well                Past Medical History:  Diagnosis Date   Asthma    Cancer (HCC)    GERD (gastroesophageal reflux disease)    Sleep apnea    CPAP- does not wear   Past Surgical History:  Procedure Laterality Date   BRONCHIAL NEEDLE ASPIRATION BIOPSY  05/22/2020   Procedure: BRONCHIAL NEEDLE ASPIRATION BIOPSIES;  Surgeon: Josephine Igo, DO;  Location: [facility];  Service: Pulmonary;;   DIRECT LARYNGOSCOPY  05/09/2020   Procedure: DIRECT LARYNGOSCOPY;  Surgeon: [name], MD;  Location: [facility] OR;  Service: ENT;;   IR GASTROSTOMY TUBE MOD SED  09/13/2020   IR GASTROSTOMY TUBE REMOVAL  12/14/2020   IR IMAGING GUIDED PORT INSERTION  06/12/2020   IR REMOVAL TUN ACCESS W/ PORT W/O FL MOD SED  01/09/2023   MASS EXCISION N/A 05/09/2020   Procedure: EXCISION TONGUE MASS;  Surgeon: [name], MD;  Location: [facility] OR;  Service: ENT;  Laterality: N/A;   TONSILLECTOMY     TRACHEOSTOMY TUBE PLACEMENT  05/09/2020   Procedure: TRACHEOSTOMY;  Surgeon: [name], MD;  Location: [facility] OR;  Service: ENT;;   VIDEO BRONCHOSCOPY WITH ENDOBRONCHIAL ULTRASOUND N/A 05/22/2020   Procedure: VIDEO BRONCHOSCOPY WITH ENDOBRONCHIAL ULTRASOUND;  Surgeon: Josephine Igo, DO;  Location: [facility];  Service: Pulmonary;  Laterality: N/A;   Patient Active Problem List   Diagnosis Date Noted   Right knee pain 12/23/2022   Encounter for Medicare annual wellness exam  12/23/2022   Phobia of dental procedure 08/06/2021   Chemotherapy-induced peripheral neuropathy (HCC) 05/28/2021   Encounter for dental examination and cleaning without abnormal findings 03/08/2021   Teeth missing 03/08/2021   Caries 03/08/2021   Defective dental restoration 03/08/2021   Excessive dental attrition 03/08/2021   Abfraction 03/08/2021   Diastema 03/08/2021   Malocclusion 03/08/2021   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   Incipient enamel caries 03/08/2021   Chronic periodontal disease 03/08/2021   Acquired lymphedema 01/11/2021   History of head and neck radiation 01/07/2021   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   Dysgeusia 01/07/2021   Accretions on teeth 01/07/2021   Pancytopenia, acquired (HCC) 12/07/2020   Dry mouth 12/07/2020   Leukopenia due to antineoplastic chemotherapy (HCC) 11/08/2020   Port-A-Cath in place 10/03/2020   Intractable hiccups 10/03/2020   Thrombocytopenia (HCC) 10/03/2020   Chemotherapy-induced fatigue 09/27/2020   Weight loss, unintentional 06/01/2020   Malnutrition of moderate degree 05/28/2020   GERD (gastroesophageal reflux disease)    Volume depletion    Hyperbilirubinemia    Hyponatremia    Mild protein-calorie malnutrition (HCC)    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 He is completing HEP without cues, as well as swallowing precautions without cues at this time. He has been provided with education surrounding proper vocal hygiene as well as aspiration PNA s/sx.  Goal Update: See below  Plan: See below.   Reason Skilled Services are Required: Ensure progress remains over  time.    ONSET DATE: 2022 but most recently, Fall 2024)   REFERRING DIAG: dysphagia, unspecified  THERAPY DIAG:  Dysphagia, pharyngeal phase  Hoarseness  Rationale for Evaluation and Treatment: Rehabilitation  SUBJECTIVE:   SUBJECTIVE STATEMENT: Pt has increased his frequency of HEP this week, compared to last week.   Pt accompanied by: self  PERTINENT HISTORY:  Alexander [name] is a 63 y.o. 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 Patient completed definitive chemoradiation to his base of tongue and subcarinal lymph node in September 2022. He has routine visits to ENT for check on status, last being stable, reporting the following: pt with postradiation changes noted in the neck and no palpable adenopathy. Patient endorses occasional aspiration, primarily with eating quickly. No recent history of pneumonia. He was prescribed Pepcid by laryngology, which he has not been taking as he states rationale for the medication was not prescribed to him and he does not have symptoms of GERD. he was advised to take this to improve symptoms. He had an updated CT neck with contrast on 12/12/2022, which did not reveal any evidence of recurrent disease. He saw Dr. Irene Pap on 11/06 for his dysphagia symptoms, and had video stroboscopy at that visit which demonstrated no evidence of masses or lesions, with moderate postcricoid edema, vocal fold atrophy and mild thickening and edema of bilateral vocal folds, secondary to XRT. Patient still experiences occasional symptoms of aspiration, which he states primarily occurs when he is eating quickly. He has not been treated for any recent episodes of pneumonia and his weight has been stable. MBS was completed 01/28/23.  PAIN:  Are you having pain? No  FALLS: Has patient fallen in last 6 months?  No  PATIENT GOALS: Improve  swallowing  OBJECTIVE:  Note: Objective measures were completed at Evaluation unless otherwise noted. DIAGNOSTIC FINDINGS:  ENT -SOLDATOVA 03/02/23: Chronic dysphonia and dysphagia s/p XRT for SCCa of the L BOT, s/p cXRT EOT 2022.  Procedure: The patient was seated upright in the exam chair.   Topical lidocaine and Afrin were applied to the nasal cavity. After adequate anesthesia had occurred, the flexible telescope was passed into the nasal cavity. The nasopharynx was patent without mass or lesion. The scope was passed behind the soft palate and directed toward the base of tongue. The base of tongue was visualized and was symmetric with no apparent masses or abnormal appearing tissue. There were no signs of a mass or pooling of secretions in the piriform sinuses. The supraglottic structures were normal.   The true vocal cords are mobile. The medial edges were straight. Closure was incomplete with supraglottic compression. Periodicity present. The mucosal wave and amplitude were decreased. There is moderate interarytenoid pachydermia and post cricoid edema. The mucosa appears without lesions.   The laryngoscope was then slowly withdrawn and the patient tolerated the procedure well. There were no complications or blood loss.  Assessment and Plan Dysphagia Chronic  dysphagia post-radiation therapy for head and neck cancer. Esophagram showed slight slowing of barium passage but no frank aspiration. Speech therapy assessment indicated minor penetration and trace aspiration. No significant weight loss or hx of pneumonia noted. Swallow function has not significantly worsened since post-treatment per report.  - refer for swallow therapy - Advised maintaining good oral hygiene and regular dental visits - Encouraged staying active and following speech therapy recommendations   Chronic Hoarseness  Discussed potential future consideration of vocal fold filler if hoarseness persists. - Repeat scope exam when  he returns - Consider vocal fold injection augmentation in the future    Gastroesophageal Reflux Disease (GERD) GERD managed with omeprazole. No significant improvement with Pepcid. Discussed long-term side effects of omeprazole, including bone density reduction and increased risk of community-acquired pneumonia. Preference for Pepcid due to lower risk profile. Discussed dietary modifications and the use of Reflux Gourmet as an alternative approach. - Continue Pepcid 20 mg daily for now - Recommend trying Reflux Gourmet - Advise slowly weaning off Pepcid if symptoms are controlled or get better   Follow-up - Schedule follow-up appointment in three months - Scope exam at next visit to reassess vocal fold condition. Indications and consent:   The patient presents to the clinic with hoarseness. All the risks, benefits, and potential complications were reviewed with the patient preoperatively and informed verbal consent was obtained.    INSTRUMENTAL SWALLOW STUDY FINDINGS (MBSS) Clinical Impression Objective swallow impairments: Pt demonstrates a mild dysphagia following a history of base of tongue cancer with radiation therapy. Pt reports dry mouth, frequent coughing with liquids and sometimes solids, sensation of food clinging in throat and occasions of nasal regurgitation. He has not had pna or any overt choking episodes. Under fluoroscopy pt demonstrates delayed swallowing intiation (pyriform sinuses), loss of volume in the base of tongue and minimally visible epiglottis without inversion, decreased tension of the velum, reduced ROM of hyoid excursion and laryngeal elevation. Functionally these difference result in penetration before the swallow with partially open vesituble during the swallow. Also instances of trace lingual and base of tongue residue penetrating the airway post swallow. There is accumulation of silent penetrate and trace silent aspiration. Nectar thick penetration does elicit a  cough. Best strategy during testing was a super supraglottic swallow and extra swallow (take a sip, hold it, hold your breath, swallow, cough, swallow again). Pt also required a liquid wash with solids due to moderate base of tongue/vallcular residue. There were some instances of trace backflow from the PES to the pyriform sinuses. Esophageal sweep unremarkable, but esophagram after MBS is a complete assessment. Recommend pt continue foods and drink of choice, but advaice f/u with OP SLP to train strategy and resume HEP for weakness and ROM, education regarding pulmonary hygiene etc. Swallowing Evaluation Recommendations: Recommendations: PO diet PO Diet Recommendation: Regular; Thin liquids (Level 0) Liquid Administration via: Cup; Straw Medication Administration: Whole meds with liquid Supervision: Patient able to self-feed Swallowing strategies: Slow rate; Small bites/sips; Follow solids with liquids; Hold breath during swallow, followed by a cough (super supraglottic swallow)     PATIENT REPORTED OUTCOME MEASURES (PROM): EAT-10: administered 04/06/23 , with pt scoring himself 15/40 with lower scores indicating better QOL in light of swallowing ability.  TREATMENT DATE:  04/29/23: Prior to ST, pt stopped taking his multivitamin due to pharyngeal clearance issues. Now he has returned to taking his multivitamin again. Pt was independent with HEP as well as POs following precautions. He was provided with vocal hygiene handout today, to be reviewed next session.   04/20/23: S/Sx aspiration PNA provided today. Pt told SLP 3 with modified independence. With precautions (fig bar and water) pt was independent. No overt s/sx difficulty today with POs. With HEP, pt was independent.  If things look the same next week he could decr frequency to once every other week.   04/13/23: Needs s/sx  aspiration PNA next visit. Pt has performed HEP 4 days since last session. SLP again strongly reiterated at least 6 days/week, and 30 reps each exercise/day. Tongue press was independent; pt req'd rare min A for Masako for tongue protrusion. He again req'd initial cue for 1-2 drops of liquid for assistance triggering a swallow.  He ate peanut butter crackers with independence following strategies except for last bite of cracker took sip at the same time and coughed after 2 subsequent chews. SLP informed pt he should take 1-2 drops of liquid should he require it due to xerostomia and explained rationale for this to pt. Tavis demonstrated understanding.   04/06/23: Pt has been doing HEP intermittently but not 6 days/week. Rahmel reports Masako is more difficult due to xerostomia. SLP strongly reiterated HEP 6 days/week, 30 reps of each exercise. Today, pt req'd initial mod cues for tongue press as pt was pressing tip to roof of mouth, and initial min A for taking DROPS of liquid to achieve Masako safely. Pt independent by session end.  He ate peanut butter crackers and drank water with initial cues for taking and holding breath with supersupraglottic. Independent by session end. Pt without additional questions.   03/16/23 (eval): Research states the risk for dysphagia increases due to radiation and/or chemotherapy treatment due to a variety of factors, so SLP re-educated the pt that his is likely the reason for reduced/limited swallow safety/ability after rad tx. SLP informed pt why this would be detrimental to their swallowing status and to their pulmonary health. Pt demonstrated understanding of these things to SLP. SLP then initiated an individualized HEP for pt involving oral and pharyngeal strengthening and ROM based upon the results from the [facility] in December 2024. Pt was provided with Masako and tongue press today and will be provided more exercises next time. Pt was re-instructed how to perform each of  these exercises, including SLP demonstration. After SLP demonstration, pt return demonstrated and SLP ensured pt performance was correct. Pt required rare min cues faded to modified independent for Masako, was independent with model with tongue press, and was told he will have 2-3 other exercises added next session. Pt was instructed to complete this program 6/7 days/week, at least 30 reps/day for 12 weeks, and then x2 a week after that, indefinitely.   PATIENT EDUCATION: Education details: late effects head/neck radiation on swallow function and HEP procedure Person educated: Patient Education method: Explanation, Demonstration, Verbal cues, and Handouts Education comprehension: verbalized understanding, returned demonstration, verbal cues required, and needs further education   ASSESSMENT:  CLINICAL IMPRESSION: RECERT TODAY. Patient is a 63 y.o. M who was seen today for treatment of swallowing after completing chemoradiation therapy in 2022. Today pt ate fig bar and drank thin liquids with coughing as req'd for swallow precautions. There were no other overt s/s oral or pharyngeal difficulty when using  strategies from MBS. At this time pt swallowing appears to continue as WNL/WFL with these POs given he uses suggested compensations above in MBS results. See "treatment date" for today's date for more details. There are no overt s/s aspiration PNA observed by SLP nor any reported by pt at this time. Pt will cont to need to be seen by SLP in order to assess safety of PO intake, assess the need for recommending any objective swallow assessment, and ensuring pt is correctly completing the individualized HEP. Frequency decr'd to once every other week - pt agrees with this.   OBJECTIVE IMPAIRMENTS: include voice disorder and dysphagia. These impairments are limiting patient from ADLs/IADLs, effectively communicating at home and in community, and safety when swallowing. Factors affecting potential to achieve  goals and functional outcome are severity of impairments. Patient will benefit from skilled SLP services to address above impairments and improve overall function.  REHAB POTENTIAL: Good   GOALS: Goals reviewed with patient? Yes  SHORT TERM GOALS: Target date: 04/17/23, 04/25/23 (due to visits)  Pt will perform HEP with modified independence in two sessions Baseline: 04/20/23 Goal status: partially met  2.  Pt will perform swallow precautions with modified independence in two sessions Baseline: 04/20/23 Goal status: partially met  3.  Pt will tell SLP 3 overt s/sx aspiration PNA with mod I Baseline:  Goal status: met   LONG TERM GOALS: Target date: 05/15/23  Pt will improve PROM from initial administration Baseline:  Goal status: INITIAL  Pt will perform HEP with independence in two sessions Baseline:04/29/23 Goal status: INITIAL  2.  Pt will perform swallow precautions with independence in two sessions Baseline: 04/29/23 Goal status: INITIAL  4.  Pt will demo understanding of good/optimal vocal hygiene with mod I in two sessions Baseline:  Goal status: INITIAL   PLAN:  SLP FREQUENCY: every other week  SLP DURATION:  3 more  sessions  PLANNED INTERVENTIONS: Aspiration precaution training, Pharyngeal strengthening exercises, Diet toleration management , Trials of upgraded texture/liquids, SLP instruction and feedback, Compensatory strategies, Patient/family education, [phone] Treatment of speech (30 or 45 min) , and [id number] Treatment of swallowing function    Jorgina Binning, CCC-SLP 04/29/2023, 8:43 AM

## 2023-04-30 ENCOUNTER — Telehealth: Payer: Self-pay

## 2023-04-30 ENCOUNTER — Other Ambulatory Visit: Payer: Self-pay | Admitting: Hematology and Oncology

## 2023-04-30 DIAGNOSIS — G620 Drug-induced polyneuropathy: Secondary | ICD-10-CM

## 2023-04-30 MED ORDER — PREGABALIN 50 MG PO CAPS: 50.0000 mg | ORAL_CAPSULE | Freq: Two times a day (BID) | ORAL | 2 refills | Status: DC

## 2023-04-30 NOTE — Telephone Encounter (Signed)
 Pt called with concern regarding his Lyrica rx. He states the dx code for the medication shows as his cancer diagnosis, which does not indicate his need for Lyrica d/t PN. MD made aware of this and she will re-send RX with associated dx.

## 2023-05-06 ENCOUNTER — Ambulatory Visit: Payer: 59

## 2023-05-13 ENCOUNTER — Ambulatory Visit: Payer: 59

## 2023-05-13 DIAGNOSIS — R1313 Dysphagia, pharyngeal phase: Secondary | ICD-10-CM | POA: Diagnosis not present

## 2023-05-13 DIAGNOSIS — R490 Dysphonia: Secondary | ICD-10-CM | POA: Diagnosis not present

## 2023-05-13 NOTE — Therapy (Signed)
 OUTPATIENT SPEECH LANGUAGE PATHOLOGY ONCOLOGY TREATMENT  Patient Name: Alexander [name] MRN: [id number] DOB:1960-06-04, 63 y.o., male Today's Date: 05/13/2023  PCP: Alexander Ship, MD REFERRING PROVIDER: Ashok Croon, MD  END OF SESSION:  End of Session - 05/13/23 0855     Visit Number 6    Number of Visits 13    Date for SLP Re-Evaluation 06/28/23    SLP Start Time 0849    SLP Stop Time  0917    SLP Time Calculation (min) 28 min    Activity Tolerance Patient tolerated treatment well                Past Medical History:  Diagnosis Date   Asthma    Cancer (HCC)    GERD (gastroesophageal reflux disease)    Sleep apnea    CPAP- does not wear   Past Surgical History:  Procedure Laterality Date   BRONCHIAL NEEDLE ASPIRATION BIOPSY  05/22/2020   Procedure: BRONCHIAL NEEDLE ASPIRATION BIOPSIES;  Surgeon: Alexander Igo, DO;  Location: [facility];  Service: Pulmonary;;   DIRECT LARYNGOSCOPY  05/09/2020   Procedure: DIRECT LARYNGOSCOPY;  Surgeon: Alexander Halon, MD;  Location: [facility] OR;  Service: ENT;;   IR GASTROSTOMY TUBE MOD SED  09/13/2020   IR GASTROSTOMY TUBE REMOVAL  12/14/2020   IR IMAGING GUIDED PORT INSERTION  06/12/2020   IR REMOVAL TUN ACCESS W/ PORT W/O FL MOD SED  01/09/2023   MASS EXCISION N/A 05/09/2020   Procedure: EXCISION TONGUE MASS;  Surgeon: Alexander Halon, MD;  Location[facility];  Service: ENT;  Laterality: N/A;   TONSILLECTOMY     TRACHEOSTOMY TUBE PLACEMENT  05/09/2020   Procedure: TRACHEOSTOMY;  Surgeon: Alexander Halon, MD;  Location: [facility] OR;  Service: ENT;;   VIDEO BRONCHOSCOPY WITH ENDOBRONCHIAL ULTRASOUND N/A 05/22/2020   Procedure: VIDEO BRONCHOSCOPY WITH ENDOBRONCHIAL ULTRASOUND;  Surgeon: Alexander Igo, DO;  Location: [facility];  Service: Pulmonary;  Laterality: N/A;   Patient Active Problem List   Diagnosis Date Noted   Right knee pain 12/23/2022   Encounter for Medicare annual wellness exam 12/23/2022   Phobia of  dental procedure 08/06/2021   Chemotherapy-induced peripheral neuropathy (HCC) 05/28/2021   Encounter for dental examination and cleaning without abnormal findings 03/08/2021   Teeth missing 03/08/2021   Caries 03/08/2021   Defective dental restoration 03/08/2021   Excessive dental attrition 03/08/2021   Abfraction 03/08/2021   Diastema 03/08/2021   Malocclusion 03/08/2021   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   Incipient enamel caries 03/08/2021   Chronic periodontal disease 03/08/2021   Acquired lymphedema 01/11/2021   History of head and neck radiation 01/07/2021   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   Dysgeusia 01/07/2021   Accretions on teeth 01/07/2021   Pancytopenia, acquired (HCC) 12/07/2020   Dry mouth 12/07/2020   Leukopenia due to antineoplastic chemotherapy (HCC) 11/08/2020   Port-A-Cath in place 10/03/2020   Intractable hiccups 10/03/2020   Thrombocytopenia (HCC) 10/03/2020   Chemotherapy-induced fatigue 09/27/2020   Weight loss, unintentional 06/01/2020   Malnutrition of moderate degree 05/28/2020   GERD (gastroesophageal reflux disease)    Volume depletion    Hyperbilirubinemia    Hyponatremia    Mild protein-calorie malnutrition (HCC)    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   Pt accompanied by: self  PERTINENT HISTORY:  Alexander [name] is a 63 y.o. 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 Patient completed definitive chemoradiation to his base of tongue and subcarinal  lymph node in September 2022. He has routine visits to ENT for check on status, last being stable, reporting the following: pt with postradiation changes noted in the neck and no palpable adenopathy. Patient endorses occasional aspiration, primarily with eating quickly. No recent history of pneumonia. He was prescribed Pepcid by laryngology, which he has not been taking as he states rationale for the medication was not prescribed to him and he does not have symptoms of GERD. he was advised to take this to improve symptoms. He had an updated CT neck with contrast on 12/12/2022, which did not reveal any evidence of recurrent disease. He saw Dr. Irene [name] on 11/06 for his dysphagia symptoms, and had video stroboscopy at that visit which demonstrated no evidence of masses or lesions, with moderate postcricoid edema, vocal fold atrophy and mild thickening and edema of bilateral vocal folds, secondary to XRT. Patient still experiences occasional symptoms of aspiration, which he states primarily occurs when he is eating quickly. He has not been treated for any recent episodes of pneumonia and his weight has been stable. MBS was completed 01/28/23.  PAIN:  Are you having pain? No  FALLS: Has patient fallen in last 6 months?  No  PATIENT GOALS: Improve swallowing  OBJECTIVE:  Note: Objective measures were completed at Evaluation unless otherwise noted. DIAGNOSTIC FINDINGS:  ENT -Alexander [name] 03/02/23: Chronic dysphonia and dysphagia s/p XRT for SCCa of the L BOT, s/p cXRT EOT 2022.  Procedure: The patient was seated upright in the exam chair.   Topical lidocaine and Afrin were applied to the nasal cavity. After adequate anesthesia had occurred, the flexible telescope was passed into the nasal cavity. The nasopharynx was patent without mass or lesion. The scope was passed behind the soft palate and directed toward the base of tongue. The base of tongue was visualized and was symmetric with no apparent masses or  abnormal appearing tissue. There were no signs of a mass or pooling of secretions in the piriform sinuses. The supraglottic structures were normal.   The true vocal cords are mobile. The medial edges were straight. Closure was incomplete with supraglottic compression. Periodicity present. The mucosal wave and amplitude were decreased. There is moderate interarytenoid pachydermia and post cricoid edema. The mucosa appears without lesions.   The laryngoscope was then slowly withdrawn and the patient tolerated the procedure well. There were no complications or blood loss.  Assessment and Plan Dysphagia Chronic dysphagia post-radiation therapy for head and neck cancer. Esophagram showed slight slowing of barium passage but no frank aspiration. Speech therapy assessment indicated minor penetration and trace aspiration. No significant weight loss or hx of pneumonia noted. Swallow function has not significantly worsened since post-treatment per report.  - refer for swallow therapy - Advised maintaining good oral hygiene and regular dental visits - Encouraged staying active and following speech therapy recommendations   Chronic Hoarseness  Discussed potential future consideration of vocal fold filler if hoarseness persists. - Repeat scope exam when he returns - Consider vocal fold injection augmentation in the future    Gastroesophageal Reflux Disease (  GERD) GERD managed with omeprazole. No significant improvement with Pepcid. Discussed long-term side effects of omeprazole, including bone density reduction and increased risk of community-acquired pneumonia. Preference for Pepcid due to lower risk profile. Discussed dietary modifications and the use of Reflux Gourmet as an alternative approach. - Continue Pepcid 20 mg daily for now - Recommend trying Reflux Gourmet - Advise slowly weaning off Pepcid if symptoms are controlled or get better   Follow-up - Schedule follow-up appointment in three  months - Scope exam at next visit to reassess vocal fold condition. Indications and consent:   The patient presents to the clinic with hoarseness. All the risks, benefits, and potential complications were reviewed with the patient preoperatively and informed verbal consent was obtained.    INSTRUMENTAL SWALLOW STUDY FINDINGS (MBSS) Clinical Impression Objective swallow impairments: Pt demonstrates a mild dysphagia following a history of base of tongue cancer with radiation therapy. Pt reports dry mouth, frequent coughing with liquids and sometimes solids, sensation of food clinging in throat and occasions of nasal regurgitation. He has not had pna or any overt choking episodes. Under fluoroscopy pt demonstrates delayed swallowing intiation (pyriform sinuses), loss of volume in the base of tongue and minimally visible epiglottis without inversion, decreased tension of the velum, reduced ROM of hyoid excursion and laryngeal elevation. Functionally these difference result in penetration before the swallow with partially open vesituble during the swallow. Also instances of trace lingual and base of tongue residue penetrating the airway post swallow. There is accumulation of silent penetrate and trace silent aspiration. Nectar thick penetration does elicit a cough. Best strategy during testing was a super supraglottic swallow and extra swallow (take a sip, hold it, hold your breath, swallow, cough, swallow again). Pt also required a liquid wash with solids due to moderate base of tongue/vallcular residue. There were some instances of trace backflow from the PES to the pyriform sinuses. Esophageal sweep unremarkable, but esophagram after MBS is a complete assessment. Recommend pt continue foods and drink of choice, but advaice f/u with OP SLP to train strategy and resume HEP for weakness and ROM, education regarding pulmonary hygiene etc. Swallowing Evaluation Recommendations: Recommendations: PO diet PO Diet  Recommendation: Regular; Thin liquids (Level 0) Liquid Administration via: Cup; Straw Medication Administration: Whole meds with liquid Supervision: Patient able to self-feed Swallowing strategies: Slow rate; Small bites/sips; Follow solids with liquids; Hold breath during swallow, followed by a cough (super supraglottic swallow)     PATIENT REPORTED OUTCOME MEASURES (PROM): EAT-10: administered 04/06/23 , with pt scoring himself 15/40 with lower scores indicating better QOL in light of swallowing ability.                                                                                                                            TREATMENT DATE:  05/13/23: Pt told SLP he has completed HEP as directed 6-7 days out of the last 13. SLP reiterated 5/7 days per week pt needed to get 30  reps of each exercise in to have the best chance to improve his swallow status. Also reiterated rationale for HEP and how it is prescribed. Caryn Bee completed 10 reps of each exercise independently. SLP suggested pt track his completion on his phone or with a written tracker sheet which SLP provided him today.  He followed swallow precautions independently. At this time pt and SLP agree pt should be seen once/month to verify he cont to perform exercises correctly, and assess need for follow up MBS.   04/29/23: Prior to ST, pt stopped taking his multivitamin due to pharyngeal clearance issues. Now he has returned to taking his multivitamin again. Pt was independent with HEP as well as POs following precautions. He was provided with vocal hygiene handout today, to be reviewed next session.   04/20/23: S/Sx aspiration PNA provided today. Pt told SLP 3 with modified independence. With precautions (fig bar and water) pt was independent. No overt s/sx difficulty today with POs. With HEP, pt was independent.  If things look the same next week he could decr frequency to once every other week.   04/13/23: Needs s/sx aspiration PNA next  visit. Pt has performed HEP 4 days since last session. SLP again strongly reiterated at least 6 days/week, and 30 reps each exercise/day. Tongue press was independent; pt req'd rare min A for Masako for tongue protrusion. He again req'd initial cue for 1-2 drops of liquid for assistance triggering a swallow.  He ate peanut butter crackers with independence following strategies except for last bite of cracker took sip at the same time and coughed after 2 subsequent chews. SLP informed pt he should take 1-2 drops of liquid should he require it due to xerostomia and explained rationale for this to pt. Tesla demonstrated understanding.   04/06/23: Pt has been doing HEP intermittently but not 6 days/week. Ladon reports Masako is more difficult due to xerostomia. SLP strongly reiterated HEP 6 days/week, 30 reps of each exercise. Today, pt req'd initial mod cues for tongue press as pt was pressing tip to roof of mouth, and initial min A for taking DROPS of liquid to achieve Masako safely. Pt independent by session end.  He ate peanut butter crackers and drank water with initial cues for taking and holding breath with supersupraglottic. Independent by session end. Pt without additional questions.   03/16/23 (eval): Research states the risk for dysphagia increases due to radiation and/or chemotherapy treatment due to a variety of factors, so SLP re-educated the pt that his is likely the reason for reduced/limited swallow safety/ability after rad tx. SLP informed pt why this would be detrimental to their swallowing status and to their pulmonary health. Pt demonstrated understanding of these things to SLP. SLP then initiated an individualized HEP for pt involving oral and pharyngeal strengthening and ROM based upon the results from the [facility] in December 2024. Pt was provided with Masako and tongue press today and will be provided more exercises next time. Pt was re-instructed how to perform each of these exercises,  including SLP demonstration. After SLP demonstration, pt return demonstrated and SLP ensured pt performance was correct. Pt required rare min cues faded to modified independent for Masako, was independent with model with tongue press, and was told he will have 2-3 other exercises added next session. Pt was instructed to complete this program 6/7 days/week, at least 30 reps/day for 12 weeks, and then x2 a week after that, indefinitely.   PATIENT EDUCATION: Education details: late effects head/neck radiation on  swallow function and HEP procedure Person educated: Patient Education method: Explanation, Demonstration, Verbal cues, and Handouts Education comprehension: verbalized understanding, returned demonstration, verbal cues required, and needs further education   ASSESSMENT:  CLINICAL IMPRESSION: Patient is a 63 y.o. M who was seen today for treatment of swallowing after completing chemoradiation therapy in 2022. Today pt ate cereal bar and drank thin liquids with coughing as req'd for swallow precautions - no coughing due to s/sx aspiration or penetration. There were no other overt s/s oral or pharyngeal difficulty when using strategies from MBS. At this time pt swallowing appears to continue as WNL/WFL with these POs given he uses suggested compensations above in MBS results. See "treatment date" for today's date for more details. There are no overt s/s aspiration PNA observed by SLP nor any reported by pt at this time. Pt will cont to need to be seen by SLP in order to assess the need for recommending any objective swallow assessment, and ensuring pt is correctly completing the individualized HEP. Frequency decr'd to once every 4 weeks - pt agrees with this. Pt may need follow up MBS in approx 8 weeks depending on how his consistency is with HEP.   OBJECTIVE IMPAIRMENTS: include voice disorder and dysphagia. These impairments are limiting patient from ADLs/IADLs, effectively communicating at home  and in community, and safety when swallowing. Factors affecting potential to achieve goals and functional outcome are severity of impairments. Patient will benefit from skilled SLP services to address above impairments and improve overall function.  REHAB POTENTIAL: Good   GOALS: Goals reviewed with patient? Yes  SHORT TERM GOALS: Target date: 04/17/23, 04/25/23 (due to visits)  Pt will perform HEP with modified independence in two sessions Baseline: 04/20/23 Goal status: partially met  2.  Pt will perform swallow precautions with modified independence in two sessions Baseline: 04/20/23 Goal status: partially met  3.  Pt will tell SLP 3 overt s/sx aspiration PNA with mod I Baseline:  Goal status: met   LONG TERM GOALS: Target date: 05/15/23, 06/28/23  Pt will improve PROM from initial administration Baseline:  Goal status: INITIAL  Pt will perform HEP with independence in two sessions Baseline:04/29/23 Goal status: met  2.  Pt will perform swallow precautions with independence in two sessions Baseline: 04/29/23 Goal status: met  4.  Pt will demo understanding of good/optimal vocal hygiene with mod I in two sessions Baseline:  Goal status: INITIAL   PLAN:  SLP FREQUENCY:  once approx every 4 weeks  SLP DURATION:  8 total  sessions  PLANNED INTERVENTIONS: Aspiration precaution training, Pharyngeal strengthening exercises, Diet toleration management , Trials of upgraded texture/liquids, SLP instruction and feedback, Compensatory strategies, Patient/family education, [phone] Treatment of speech (30 or 45 min) , and [id number] Treatment of swallowing function    Vi Whitesel, CCC-SLP 05/13/2023, 9:35 AM

## 2023-05-20 ENCOUNTER — Encounter: Payer: Self-pay | Admitting: Hematology and Oncology

## 2023-05-20 ENCOUNTER — Ambulatory Visit (INDEPENDENT_AMBULATORY_CARE_PROVIDER_SITE_OTHER): Payer: PRIVATE HEALTH INSURANCE | Admitting: Family Medicine

## 2023-05-20 ENCOUNTER — Encounter (HOSPITAL_BASED_OUTPATIENT_CLINIC_OR_DEPARTMENT_OTHER): Payer: Self-pay | Admitting: Family Medicine

## 2023-05-20 VITALS — BP 110/71 | HR 94 | Ht 74.0 in | Wt 296.8 lb

## 2023-05-20 DIAGNOSIS — Z125 Encounter for screening for malignant neoplasm of prostate: Secondary | ICD-10-CM

## 2023-05-20 DIAGNOSIS — M25561 Pain in right knee: Secondary | ICD-10-CM | POA: Diagnosis not present

## 2023-05-20 DIAGNOSIS — G8929 Other chronic pain: Secondary | ICD-10-CM

## 2023-05-20 DIAGNOSIS — Z0000 Encounter for general adult medical examination without abnormal findings: Secondary | ICD-10-CM

## 2023-05-20 DIAGNOSIS — M545 Low back pain, unspecified: Secondary | ICD-10-CM | POA: Diagnosis not present

## 2023-05-20 MED ORDER — CYCLOBENZAPRINE HCL 10 MG PO TABS: 10.0000 mg | ORAL_TABLET | Freq: Two times a day (BID) | ORAL | 2 refills | Status: AC | PRN

## 2023-05-20 NOTE — Progress Notes (Signed)
    Procedures performed today:    None.  Independent interpretation of notes and tests performed by another provider:   None.  Brief History, Exam, Impression, and Recommendations:    BP 110/71 (BP Location: Right Arm, Patient Position: Sitting, Cuff Size: Large)   Pulse 94   Ht 6\' 2"  (1.88 m)   Wt 296 lb 12.8 oz (134.6 kg)   SpO2 97%   BMI 38.11 kg/m   Right knee pain, unspecified chronicity Assessment & Plan: Reviewed x-rays which were completed after last appointment.  X-rays with mild degree of arthritis noted, primarily noted within medial compartment.  Discussed recommendations from management standpoint.  For now, patient plans to continue with conservative measures, OTC medications.  Discussed possibility of procedural invention including injection options.  This is something that she may consider, but would like to monitor symptoms for now with use of conservative measures as symptoms are not as impactful currently.  Feel that this is reasonable.  Can follow-up as needed regarding this   Chronic low back pain, unspecified back pain laterality, unspecified whether sciatica present -     Cyclobenzaprine HCl; Take 1 tablet (10 mg total) by mouth 2 (two) times daily as needed for muscle spasms.  Dispense: 30 tablet; Refill: 2  Annual physical exam -     CBC with Differential/Platelet; Future -     Comprehensive metabolic panel with GFR; Future -     Hemoglobin A1c; Future -     Lipid panel; Future -     PSA Total (Reflex To Free); Future -     TSH Rfx on Abnormal to Free T4; Future  Prostate cancer screening -     PSA Total (Reflex To Free); Future  Return in about 6 months (around 11/19/2023) for CPE with fasting labs 1 week prior.   ___________________________________________ Leitha Hyppolite de [place], MD, ABFM, CAQSM Primary Care and Sports Medicine [facility]

## 2023-05-20 NOTE — Assessment & Plan Note (Signed)
 Reviewed x-rays which were completed after last appointment.  X-rays with mild degree of arthritis noted, primarily noted within medial compartment.  Discussed recommendations from management standpoint.  For now, patient plans to continue with conservative measures, OTC medications.  Discussed possibility of procedural invention including injection options.  This is something that she may consider, but would like to monitor symptoms for now with use of conservative measures as symptoms are not as impactful currently.  Feel that this is reasonable.  Can follow-up as needed regarding this

## 2023-05-20 NOTE — Patient Instructions (Signed)
  Medication Instructions:  Your physician recommends that you continue on your current medications as directed. Please refer to the Current Medication list given to you today. --If you need a refill on any your medications before your next appointment, please call your pharmacy first. If no refills are authorized on file call the office.-- Lab Work: Your physician has recommended that you have lab work today: 1 week before next visit  If you have labs (blood work) drawn today and your tests are completely normal, you will receive your results via MyChart message OR a phone call from our staff.  Please ensure you check your voicemail in the event that you authorized detailed messages to be left on a delegated number. If you have any lab test that is abnormal or we need to change your treatment, we will call you to review the results.  Follow-Up: Your next appointment:   Your physician recommends that you schedule a follow-up appointment in: 6 month physical with Dr. de [place]  You will receive a text message or e-mail with a link to a survey about your care and experience with [place] today! We would greatly appreciate your feedback!   Thanks for letting us be apart of your health journey!!  Primary Care and Sports Medicine   Dr. Ceasar Mons [place]   We encourage you to activate your patient portal called "MyChart".  Sign up information is provided on this After Visit Summary.  MyChart is used to connect with patients for Virtual Visits (Telemedicine).  Patients are able to view lab/test results, encounter notes, upcoming appointments, etc.  Non-urgent messages can be sent to your provider as well. To learn more about what you can do with MyChart, please visit --  [email or web address].

## 2023-06-01 ENCOUNTER — Encounter (INDEPENDENT_AMBULATORY_CARE_PROVIDER_SITE_OTHER): Payer: Self-pay | Admitting: Otolaryngology

## 2023-06-01 ENCOUNTER — Ambulatory Visit (INDEPENDENT_AMBULATORY_CARE_PROVIDER_SITE_OTHER): Payer: 59 | Admitting: Otolaryngology

## 2023-06-01 VITALS — BP 121/81 | HR 67

## 2023-06-01 DIAGNOSIS — J343 Hypertrophy of nasal turbinates: Secondary | ICD-10-CM

## 2023-06-01 DIAGNOSIS — J342 Deviated nasal septum: Secondary | ICD-10-CM

## 2023-06-01 DIAGNOSIS — K219 Gastro-esophageal reflux disease without esophagitis: Secondary | ICD-10-CM

## 2023-06-01 DIAGNOSIS — Z923 Personal history of irradiation: Secondary | ICD-10-CM | POA: Diagnosis not present

## 2023-06-01 DIAGNOSIS — R0982 Postnasal drip: Secondary | ICD-10-CM | POA: Diagnosis not present

## 2023-06-01 DIAGNOSIS — R0981 Nasal congestion: Secondary | ICD-10-CM | POA: Diagnosis not present

## 2023-06-01 DIAGNOSIS — R1310 Dysphagia, unspecified: Secondary | ICD-10-CM | POA: Diagnosis not present

## 2023-06-01 DIAGNOSIS — R1313 Dysphagia, pharyngeal phase: Secondary | ICD-10-CM

## 2023-06-01 DIAGNOSIS — R490 Dysphonia: Secondary | ICD-10-CM | POA: Diagnosis not present

## 2023-06-01 DIAGNOSIS — J3089 Other allergic rhinitis: Secondary | ICD-10-CM

## 2023-06-01 MED ORDER — NYSTATIN 100000 UNIT/ML MT SUSP: 5.0000 mL | Freq: Three times a day (TID) | OROMUCOSAL | 0 refills | Status: AC

## 2023-06-01 MED ORDER — DOXYCYCLINE HYCLATE 100 MG PO TABS: 100.0000 mg | ORAL_TABLET | Freq: Two times a day (BID) | ORAL | 0 refills | Status: DC

## 2023-06-01 NOTE — Patient Instructions (Signed)
 Lloyd Huger Med Nasal Saline Rinse   - start nasal saline rinses with [employer]Med Bottle available over the counter or online to help with nasal congestion

## 2023-06-01 NOTE — Progress Notes (Signed)
 ENT Progress Note:   Update 06/01/2023  Discussed the use of AI scribe software for clinical note transcription with the patient, who gave verbal consent to proceed.  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  He experienced an incident where an omega-3 tablet became lodged in his throat, causing discomfort as it went down sideways. He initially panicked, fearing airway obstruction, but was able to breathe and eventually swallowed it. He has since switched to smaller tablets to prevent recurrence. He continues to experience dry mouth, which he attributes to radiation affecting his salivary glands.  He uses an inhaler, Trelegy, at night. He has a history of asthma and reports being able to tolerate high peak flow readings despite asthma attacks.  He has been informed in the past about having large nasal polyps but does not experience sinus headaches or sinus infections. He uses Zyrtec and Flonase for allergies and has a setup for nasal saline rinses provided by the [employer] program, which he uses when experiencing issues.  He is currently on Pepcid for reflux and has not reported any recurrence of lesions post-radiation. No difficulty in breathing despite nasal congestion.   Records Reviewed:  Initial Evaluation  Update 03/02/23:   He returns following swallow study evaluation. Esophagram showed dysmotility, no aspiration and no strictures. MBS showed mild pharyngeal dysphagia. No change sin his voice and swallowing function since last office visit.     Initial Consultation: Reason for Consult: SCC of the left tongue base, dysphagia post XRT   HPI: Alexander [name] is an 63 y.o. male with hx of SCCa of the tongue diagnosed in 2021, s/p biopsy and emergency trach, f/b cXRT, EOT Sept 2021, here to see me for dysphagia.  He has chronic dry mouth. 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 He had a PEG which was removed in 2022. He had trouble with taste sensation and pain after the XRT, but over time, it improved, and he started to eat by mouth. His chemo catheter was removed 12/2022. Last surveillance scan was 12/12/22, CT chest and neck were done. He coughs with liquids and solids at times. He at times feels that the food get stuck in the back of his throat. He is eating regular diet. He at times regurgitates solid foods into his nose. No fevers, no chills. No hx of PNA. He gained weight.    Records Reviewed:  Dr Edward Qualia office 09/09/22 Moderate, persistent asthma. - will [name] - has progression of his symptoms - never got records from [place] - will have him try trelegy 200 one puff daily in place of advair - prn albuterol - if no improvement, then will need additional testing (PFT, CXR, CBC with diff, FeNO)   Allergic rhinitis. - prn flonase, zyrtec   Snoring. 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 [name] with oncology  ED visit 06/11/22 63 year old male history of head neck cancer, asthma, presents today after possible allergic reaction. Patient states he drank ginger tea with honey and had added honey. He then felt like he was having difficulty swallowing and like he was unable to breathe through the back of his  throat but states he was breathing through his nose fine. He felt like he had some tingling in his mouth. Symptoms have improved. He did not have any sweating, lightheadedness, dyspnea, or GI symptoms. He has not had an allergic reaction in the past. Does have a history of head and neck cancer which has previously been treated. He has a history of asthma. He has had some URI symptoms over the past several days. A/P 63 year old male  with some tingling in mouth after drinking ginger honey tea that had honey mixed in.  He has been drinking this for some URI symptoms over the past several days Differential diagnosis includes was not limited to local allergic reaction, other paresthesias, acute allergic reaction, infectious etiology Patient appears to be hemodynamically stable and no obvious signs or symptoms of allergic reaction on my exam.  Given that he has had some asthma and has had URI symptoms with wheezing, patient received Solu-Medrol.  He also received Benadryl.  Patient was observed for several hours and remained asymptomatic here in the ED.  Chest x-ray was also obtained due to the fact that he had his URI symptoms no evidence of acute or maladies noted.    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     He completed 4 cycles of 5-FU/carbo/Keytruda for induction.   He had remarkable response and no toxicity to report He was discussed in the head and neck tumor board and the plan was to consider concurrent chemoradiation to the neck and radiation to the mediastinum given oligometastatic disease.   He clearly understands this is not necessarily curative in intent however may give him prolonged progression free interval.   He started weekly cisplatin 09/19/2020 He received 6 weekly cycles of cisplatin concomintant with radiation. EOT PET with complete response   He is here for follow up. Given oligometastatic disease at presentation, we have discussed about doing routine imaging  every 6 months. He is due for imaging in May. No clinical concerns today on ROS or PE. He will see Dr Basilio Cairo after imaging in May He will RTC in November for follow up with me. Past Medical History:  Diagnosis Date   Asthma    Cancer (HCC)    GERD (gastroesophageal reflux disease)    Sleep apnea    CPAP- does not wear    Past Surgical History:  Procedure Laterality Date   BRONCHIAL NEEDLE ASPIRATION BIOPSY  05/22/2020   Procedure: BRONCHIAL NEEDLE ASPIRATION BIOPSIES;  Surgeon: Josephine Igo, DO;  Location: [facility];  Service: Pulmonary;;   DIRECT LARYNGOSCOPY  05/09/2020   Procedure: DIRECT LARYNGOSCOPY;  Surgeon: [name], MD;  Location: [facility];  Service: ENT;;   IR GASTROSTOMY TUBE MOD SED  09/13/2020   IR GASTROSTOMY TUBE REMOVAL  12/14/2020   IR IMAGING GUIDED PORT INSERTION  06/12/2020   IR REMOVAL TUN ACCESS W/ PORT W/O FL MOD SED  01/09/2023   MASS EXCISION N/A 05/09/2020   Procedure: EXCISION TONGUE MASS;  Surgeon: [name], MD;  Location: [facility] OR;  Service: ENT;  Laterality: N/A;   TONSILLECTOMY  TRACHEOSTOMY TUBE PLACEMENT  05/09/2020   Procedure: TRACHEOSTOMY;  Surgeon: Prescott Brodie, MD;  Location: [facility] OR;  Service: ENT;;   VIDEO BRONCHOSCOPY WITH ENDOBRONCHIAL ULTRASOUND N/A 05/22/2020   Procedure: VIDEO BRONCHOSCOPY WITH ENDOBRONCHIAL ULTRASOUND;  Surgeon: Prudy Brownie, DO;  Location: [facility];  Service: Pulmonary;  Laterality: N/A;    Family History  Problem Relation Age of Onset   Breast cancer Mother    Cancer - Cervical Mother    Colon cancer Father    Colon cancer Brother     Social History:  reports that he has never smoked. He has never been exposed to tobacco smoke. He has never used smokeless tobacco. He reports that he does not currently use alcohol. He reports that he does not use drugs.  Allergies:  Allergies  Allergen Reactions   Tessalon [Benzonatate]     Medications: I have reviewed the patient's  current medications.  The PMH, PSH, Medications, Allergies, and SH were reviewed and updated.  ROS: Constitutional: Negative for fever, weight loss and weight gain. Cardiovascular: Negative for chest pain and dyspnea on exertion. Respiratory: Is not experiencing shortness of breath at rest. Gastrointestinal: Negative for nausea and vomiting. Neurological: Negative for headaches. Psychiatric: The patient is not nervous/anxious  Blood pressure 121/81, pulse 67, SpO2 96%.  PHYSICAL EXAM:  Exam: General: Well-developed, well-nourished Communication and Voice: raspy Respiratory Respiratory effort: Equal inspiration and expiration without stridor Cardiovascular Peripheral Vascular: Warm extremities with equal color/perfusion Eyes: No nystagmus with equal extraocular motion bilaterally Neuro/Psych/Balance: Patient oriented to person, place, and time; Appropriate mood and affect; Gait is intact with no imbalance; Cranial nerves I-XII are intact Head and Face Inspection: Normocephalic and atraumatic without mass or lesion Palpation: Facial skeleton intact without bony stepoffs Salivary Glands: No mass or tenderness Facial Strength: Facial motility symmetric and full bilaterally ENT Pinna: External ear intact and fully developed External canal: Canal is patent with intact skin Tympanic Membrane: Clear and mobile External Nose: No scar or anatomic deformity Internal Nose: Septum is deviated to the left. No polyp, or purulence. Mucosal edema and erythema present.  Bilateral inferior turbinate hypertrophy.  Lips, Teeth, and gums: Mucosa and teeth intact and viable TMJ: No pain to palpation with full mobility Oral cavity/oropharynx: No erythema or exudate, no lesions present, post-XRT changes along the base of the tongue and surrounding laryngeal structure, no masses or lesions White residue along the soft palate and uvula concerning for thrush Nasopharynx: No mass or lesion with intact  mucosa, palate closure appears intact  Hypopharynx: Intact mucosa with pooling of secretions in post-cricoid area, pyriforms and along vallecula  Larynx Glottic: Full true vocal cord mobility without lesion or mass thick mucus along epiglottis (could be from post-nasal drainage) Supraglottic: Mild edema of the epiglottis and along vallecula, mild deformity of the epiglottis likely 2/2 post-XRT effects Interarytenoid Space: Moderate pachydermia edema Subglottic Space: not seen well Neck Neck and Trachea: Midline trachea without mass or lesion Thyroid: No mass or nodularity Lymphatics: No lymphadenopathy  Procedure: Preoperative diagnosis: dysphonia dysphagia hx of XRT for SCCa of the tongue base  Postoperative diagnosis:   Same  Procedure: Flexible fiberoptic laryngoscopy  Surgeon: Aryahna Spagna, MD  Anesthesia: Topical lidocaine and Afrin Complications: None Condition is stable throughout exam  Indications and consent:  The patient presents to the clinic with above symptoms. Indirect laryngoscopy view was incomplete. Thus it was recommended that they undergo a flexible fiberoptic laryngoscopy. All of the risks, benefits, and potential complications were reviewed with  the patient preoperatively and verbal informed consent was obtained.  Procedure: The patient was seated upright in the clinic. Topical lidocaine and Afrin were applied to the nasal cavity. After adequate anesthesia had occurred, I then proceeded to pass the flexible telescope into the nasal cavity. The nasal cavity was patent without rhinorrhea or polyp. The nasopharynx was also patent without mass or lesion. The base of tongue was visualized and was normal. There were no signs of pooling of secretions in the piriform sinuses. The true vocal folds were mobile bilaterally. There were no signs of glottic or supraglottic mucosal lesion or mass. There was moderate interarytenoid pachydermia and post cricoid edema. The telescope  was then slowly withdrawn and the patient tolerated the procedure throughout.    PROCEDURE NOTE: nasal endoscopy  Preoperative diagnosis: chronic sinusitis symptoms  Postoperative diagnosis: same  Procedure: Diagnostic nasal endoscopy (40981)  Surgeon: Artice Last, M.D.  Anesthesia: Topical lidocaine and Afrin  H&P REVIEW: The patient's history and physical were reviewed today prior to procedure. All medications were reviewed and updated as well. Complications: None Condition is stable throughout exam Indications and consent: The patient presents with symptoms of chronic sinusitis not responding to previous therapies. All the risks, benefits, and potential complications were reviewed with the patient preoperatively and informed consent was obtained. The time out was completed with confirmation of the correct procedure.   Procedure: The patient was seated upright in the clinic. Topical lidocaine and Afrin were applied to the nasal cavity. After adequate anesthesia had occurred, the rigid nasal endoscope was passed into the nasal cavity. The nasal mucosa, turbinates, septum, and sinus drainage pathways were visualized bilaterally. This revealed no purulence but there was thick clear mucus along bilateral . There were no polyps or sites of significant inflammation. The mucosa was intact and there was no crusting present. The scope was then slowly withdrawn and the patient tolerated the procedure well. There were no complications or blood loss.  Prior procedures: Summary of Video-Laryngeal-Stroboscopy from 03/02/23: post-XRT changes along epiglottis and left BOT, no masses or lesions, moderate post-cricoid edema, VF atrophy and mild thickening/edema of b/l VF, likely related to XRT, incomplete glottic closure, supraglottic compression. Mucosal wave is present but decreased.     Studies Reviewed: CT neck FINDINGS: Pharynx and larynx: Unchanged architectural distortion of the oropharynx  with symmetric epiglottic thickening that is considered treatment related. No recurrent or metachronous mass is noted. Evidence of prior tracheostomy.   Salivary glands: Post treatment atrophy especially of the submandibular glands.   Thyroid: Normal   Lymph nodes: None enlarged or abnormal density.   Vascular: Porta catheter on the right.   Limited intracranial: Negative   Visualized orbits: Negative   Mastoids and visualized paranasal sinuses: Chronic generalized sinus opacification with patchy involvement especially of the ethmoids. Negative orbits.   Skeleton: No acute finding   Upper chest: Reported separately   IMPRESSION: Stable post treatment neck.  No evidence of recurrent disease.  CT CHEST WITH CONTRAST   TECHNIQUE: Multidetector CT imaging of the chest was performed during intravenous contrast administration.   RADIATION DOSE REDUCTION: This exam was performed according to the departmental dose-optimization program which includes automated exposure control, adjustment of the mA and/or kV according to patient size and/or use of iterative reconstruction technique.   CONTRAST:  100mL OMNIPAQUE IOHEXOL 300 MG/ML  SOLN   COMPARISON:  CT 11/21/2021 and older.  PET-CT 06/17/2021 and older.   FINDINGS: Cardiovascular: Right upper chest port. Heart is nonenlarged. No pericardial  effusion. The thoracic aorta has a normal course and caliber.   Mediastinum/Nodes: No specific abnormal lymph node enlargement identified in the axillary regions, hilum or mediastinum. Normal caliber thoracic esophagus. Preserved thyroid gland.   Lungs/Pleura: There is some linear opacity lung bases likely scar or atelectasis. No consolidation, pneumothorax or effusion.   Upper Abdomen: No acute abnormality.   Musculoskeletal: Scattered degenerative changes along the thoracic spine with some endplate osteophytes and multilevel disc height loss.   IMPRESSION: No acute  cardiopulmonary disease.  Chest port.   No developing mass lesion, fluid collection or lymph node enlargement in the thorax.  MBS 01/28/23 Pt demonstrates a mild dysphagia following a history of base of tongue cancer with radiation therapy. Pt reports dry mouth, frequent coughing with liquids and sometimes solids, sensation of food clinging in throat and occasions of nasal regurgitation. He has not had pna or any overt choking episodes. Under fluoroscopy pt demonstrates delayed swallowing intiation (pyriform sinuses), loss of volume in the base of tongue and minimally visible epiglottis without inversion, decreased tension of the velum, reduced ROM of hyoid excursion and laryngeal elevation. Functionally these difference result in penetration before the swallow with partially open vesituble during the swallow. Also instances of trace lingual and base of tongue residue penetrating the airway post swallow. There is accumulation of silent penetrate and trace silent aspiration. Nectar thick penetration does elicit a cough. Best strategy during testing was a super supraglottic swallow and extra swallow (take a sip, hold it, hold your breath, swallow, cough, swallow again). Pt also required a liquid wash with solids due to moderate base of tongue/vallcular residue. There were some instances of trace backflow from the PES to the pyriform sinuses. Esophageal sweep unremarkable, but esophagram after MBS is a complete assessment. Recommend pt continue foods and drink of choice, but advaice f/u with OP SLP to train strategy and resume HEP for weakness and ROM, education regarding pulmonary hygiene etc.   Esophagram 01/28/23 IMPRESSION: 1. Non silent aspiration with thin consistency barium. Please refer to dedicated speech swallow examination performed earlier same day. 2. Decreased esophageal motility. 3. No esophageal mucosal abnormality, mass or stricture 4. No hiatal hernia. 5. No gastroesophageal reflux was  visualized or elicited.  Assessment/Plan: Encounter Diagnoses  Name Primary?   Dysphagia, unspecified type Yes   Pharyngeal dysphagia    Dysphonia    Chronic nasal congestion    Chronic GERD    History of head and neck radiation    Environmental and seasonal allergies    History of radiation to head and neck region      62 yoM hx of L BOT SCCa p16(+), s/p cXRT 2022 required PEG during the course of treatment, removed in 2022, here for chronic long-standing dysphagia, with food getting caught in his throat, and at times solid food regurgitation. Denies coughing or choking with liquids. Consumes regular diet currently. Denies weight loss, denies fevers, aspiration PNA. Has raspy voice since XRT treatment, not concerned about projection or quality of his voice.   Chronic dysphonia and dysphagia s/p XRT, able to tolerate regular diet, without instances of aspiration PNA Videostrobe today - post-XRT changes along epiglottis and left BOT, no masses or lesions, moderate post-cricoid edema, VF atrophy and mild thickening/edema of b/l VF, likely related to XRT, incomplete glottic closure, supraglottic compression. Mucosal wave is present but decreased.  - schedule swallow study (MBS and esophagram) - previously had swallow therapy, will consider another trial of swallow therapy  2. Chronic nasal congestion  -  start Flonase (nasal spray) and Zyrtec - start nasal saline rinses   3. Chronic GERD  - start Pepcid (Famotidine) for reflux  - Take Reflux Gourmet (natural supplement available on [employer]) to help with symptoms of chronic throat irritation   Update 03/02/23 Chronic dysphonia and dysphagia s/p XRT for SCCa of the L BOT, s/p cXRT EOT 2022.   Assessment and Plan    Dysphagia Chronic dysphagia post-radiation therapy for head and neck cancer. Esophagram showed slight slowing of barium passage but no frank aspiration. Speech therapy assessment indicated minor penetration and trace  aspiration. No significant weight loss or hx of pneumonia noted. Swallow function has not significantly worsened since post-treatment per report.  - refer for swallow therapy - Advised maintaining good oral hygiene and regular dental visits - Encouraged staying active and following speech therapy recommendations   Chronic Hoarseness  Discussed potential future consideration of vocal fold filler if hoarseness persists. - Repeat scope exam when he returns - Consider vocal fold injection augmentation in the future   Gastroesophageal Reflux Disease (GERD) GERD managed with omeprazole. No significant improvement with Pepcid. Discussed long-term side effects of omeprazole, including bone density reduction and increased risk of community-acquired pneumonia. Preference for Pepcid due to lower risk profile. Discussed dietary modifications and the use of Reflux Gourmet as an alternative approach. - Continue Pepcid 20 mg daily for now - Recommend trying Reflux Gourmet - Advise slowly weaning off Pepcid if symptoms are controlled or get better  Follow-up - Schedule follow-up appointment in three months - Scope exam at next visit to reassess vocal fold condition.   Assessment and Plan Assessment & Plan Voice changes post-radiation  Voice changes due to post-radiation effects on vocal cords and salivary glands, causing xerostomia and chronic dysphonia. - No lesions on flexible laryngoscopy today, near complete closure. - Continue speech therapy. - Advise avoiding straining when talking - medical management of GERD and post-nasal drainage  Dysphagia Difficulty swallowing large tablets, switched to smaller tablets to prevent recurrence. - Advise taking pills with yogurt or applesauce to ease swallowing. - Encourage focusing on swallowing techniques. - Discuss swallowing incident with speech therapist - he is still in speech therapy  Oral thrush Possible oral thrush due to inhaler use (white  residue along soft palate on exam today). - Prescribe nystatin swish and swallow for a couple of weeks.  Chronic nasal congestion and postnasal drip Nasal congestion and postnasal drainage likely due to allergies, with nasal inflammation and mucus streaks seen on nasal endoscopy today. - Prescribe doxycycline for presumed sinus infection and to help with post-nasal drainage - Advise daily nasal saline rinses. - Continue Zyrtec and Flonase.  Gastroesophageal reflux disease (GERD) Post-radiation changes and reflux observed. - Continue Pepcid.  Asthma Asthma well-managed with Trelegy inhaler. - Continue current asthma management regimen. -rinse after inhaler use  Quetzally Callas, MD Otolaryngology [facility] Phone: [phone] Fax: [phone]    06/01/2023, 12:08 PM

## 2023-06-03 ENCOUNTER — Telehealth (INDEPENDENT_AMBULATORY_CARE_PROVIDER_SITE_OTHER): Payer: Self-pay

## 2023-06-03 NOTE — Telephone Encounter (Signed)
 Patient called and had a question about the Nystatin, I told him per Dr Adelia Adolphus note he is to swish and swallow for the next 14 days

## 2023-06-10 ENCOUNTER — Ambulatory Visit: Payer: 59 | Attending: Otolaryngology

## 2023-06-10 DIAGNOSIS — R490 Dysphonia: Secondary | ICD-10-CM | POA: Insufficient documentation

## 2023-06-10 DIAGNOSIS — R1313 Dysphagia, pharyngeal phase: Secondary | ICD-10-CM | POA: Insufficient documentation

## 2023-06-10 NOTE — Therapy (Signed)
 OUTPATIENT SPEECH LANGUAGE PATHOLOGY ONCOLOGY TREATMENT  Patient Name: Alexander [name] MRN: [id number] DOB:1960-03-25, 63 y.o., male Today's Date: 06/10/2023  PCP: Ulysees Gander, MD REFERRING PROVIDER: Artice Last, MD  END OF SESSION:  End of Session - 06/10/23 1519     Visit Number 7    Number of Visits 13    Date for SLP Re-Evaluation 06/28/23    SLP Start Time 1405    SLP Stop Time  1441    SLP Time Calculation (min) 36 min    Activity Tolerance Patient tolerated treatment well                 Past Medical History:  Diagnosis Date   Asthma    Cancer (HCC)    GERD (gastroesophageal reflux disease)    Sleep apnea    CPAP- does not wear   Past Surgical History:  Procedure Laterality Date   BRONCHIAL NEEDLE ASPIRATION BIOPSY  05/22/2020   Procedure: BRONCHIAL NEEDLE ASPIRATION BIOPSIES;  Surgeon: Prudy Brownie, DO;  Location: [facility];  Service: Pulmonary;;   DIRECT LARYNGOSCOPY  05/09/2020   Procedure: DIRECT LARYNGOSCOPY;  Surgeon: Prescott Brodie, MD;  Location: [facility] OR;  Service: ENT;;   IR GASTROSTOMY TUBE MOD SED  09/13/2020   IR GASTROSTOMY TUBE REMOVAL  12/14/2020   IR IMAGING GUIDED PORT INSERTION  06/12/2020   IR REMOVAL TUN ACCESS W/ PORT W/O FL MOD SED  01/09/2023   MASS EXCISION N/A 05/09/2020   Procedure: EXCISION TONGUE MASS;  Surgeon: Prescott Brodie, MD;  Location: [facility] OR;  Service: ENT;  Laterality: N/A;   TONSILLECTOMY     TRACHEOSTOMY TUBE PLACEMENT  05/09/2020   Procedure: TRACHEOSTOMY;  Surgeon: Prescott Brodie, MD;  Location: [facility] OR;  Service: ENT;;   VIDEO BRONCHOSCOPY WITH ENDOBRONCHIAL ULTRASOUND N/A 05/22/2020   Procedure: VIDEO BRONCHOSCOPY WITH ENDOBRONCHIAL ULTRASOUND;  Surgeon: Prudy Brownie, DO;  Location: [facility];  Service: Pulmonary;  Laterality: N/A;   Patient Active Problem List   Diagnosis Date Noted   Moderate persistent asthma without complication 02/09/2023   Non-seasonal allergic rhinitis due  to pollen 02/09/2023   Right knee pain 12/23/2022   Encounter for Medicare annual wellness exam 12/23/2022   Phobia of dental procedure 08/06/2021   Chemotherapy-induced peripheral neuropathy (HCC) 05/28/2021   Encounter for dental examination and cleaning without abnormal findings 03/08/2021   Teeth missing 03/08/2021   Caries 03/08/2021   Defective dental restoration 03/08/2021   Excessive dental attrition 03/08/2021   Abfraction 03/08/2021   Diastema 03/08/2021   Malocclusion 03/08/2021   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   Incipient enamel caries 03/08/2021   Chronic periodontal disease 03/08/2021   Acquired lymphedema 01/11/2021   History of head and neck radiation 01/07/2021   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   Dysgeusia 01/07/2021   Accretions on teeth 01/07/2021   Pancytopenia, acquired (HCC) 12/07/2020   Dry mouth 12/07/2020   Leukopenia due to antineoplastic chemotherapy (HCC) 11/08/2020   Port-A-Cath in place 10/03/2020   Intractable hiccups 10/03/2020   Thrombocytopenia (HCC) 10/03/2020   Chemotherapy-induced fatigue 09/27/2020   Weight loss, unintentional 06/01/2020   Malnutrition of moderate degree 05/28/2020   GERD (gastroesophageal reflux disease)    Volume depletion    Hyperbilirubinemia    Hyponatremia    Mild protein-calorie malnutrition (HCC)    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   Pt accompanied by: self  PERTINENT HISTORY:  Alexander [name] is a 63 y.o. 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 Patient completed definitive chemoradiation to his base of tongue and subcarinal lymph node in September 2022. He has routine visits to ENT for check on status, last being stable, reporting the following: pt with postradiation changes noted in the neck and no palpable adenopathy. Patient endorses occasional aspiration, primarily with eating quickly. No recent history of pneumonia. He was prescribed Pepcid  by laryngology, which he has not been taking as he states rationale for the medication was not prescribed to him and he does not have symptoms of GERD. he was advised to take this to improve symptoms. He had an updated CT neck with contrast on 12/12/2022, which did not reveal any evidence of recurrent disease. He saw Dr. Soldatova on 11/06 for his dysphagia symptoms, and had video stroboscopy at that visit which demonstrated no evidence of masses or lesions, with moderate postcricoid edema, vocal fold atrophy and mild thickening and edema of bilateral vocal folds, secondary to XRT. Patient still experiences occasional symptoms of aspiration, which he states primarily occurs when he is eating quickly. He has not been treated for any recent episodes of pneumonia and his weight has been stable. MBS was completed 01/28/23.  PAIN:  Are you having pain? No  FALLS: Has patient fallen in last 6 months?  No  PATIENT GOALS: Improve swallowing  OBJECTIVE:  Note: Objective measures were completed at Evaluation unless otherwise noted. DIAGNOSTIC FINDINGS:  ENT -SOLDATOVA 03/02/23: Chronic dysphonia and dysphagia s/p XRT for SCCa of the L BOT, s/p cXRT EOT 2022.  Procedure: The patient was seated upright in the exam chair.   Topical lidocaine  and Afrin were applied to the nasal cavity. After adequate anesthesia had occurred, the flexible telescope was passed into the nasal cavity. The nasopharynx was patent without mass or lesion. The scope was passed behind the  soft palate and directed toward the base of tongue. The base of tongue was visualized and was symmetric with no apparent masses or abnormal appearing tissue. There were no signs of a mass or pooling of secretions in the piriform sinuses. The supraglottic structures were normal.   The true vocal cords are mobile. The medial edges were straight. Closure was incomplete with supraglottic compression. Periodicity present. The mucosal wave and amplitude were decreased. There is moderate interarytenoid pachydermia and post cricoid edema. The mucosa appears without lesions.   The laryngoscope was then slowly withdrawn and the patient tolerated the procedure well. There were no complications or blood loss.  Assessment and Plan Dysphagia Chronic dysphagia post-radiation therapy for head and neck cancer. Esophagram showed slight slowing of barium passage but no frank aspiration. Speech therapy assessment indicated minor penetration and trace aspiration. No significant weight loss or hx of pneumonia noted. Swallow function has not significantly worsened since post-treatment per report.  - refer for swallow therapy - Advised maintaining good oral hygiene and regular dental visits - Encouraged staying active and following speech therapy recommendations   Chronic Hoarseness  Discussed potential future consideration of vocal fold filler if hoarseness persists. -  Repeat scope exam when he returns - Consider vocal fold injection augmentation in the future    Gastroesophageal Reflux Disease (GERD) GERD managed with omeprazole. No significant improvement with Pepcid . Discussed long-term side effects of omeprazole, including bone density reduction and increased risk of community-acquired pneumonia. Preference for Pepcid  due to lower risk profile. Discussed dietary modifications and the use of Reflux Gourmet as an alternative approach. - Continue Pepcid  20 mg daily for now - Recommend trying Reflux Gourmet - Advise  slowly weaning off Pepcid  if symptoms are controlled or get better   Follow-up - Schedule follow-up appointment in three months - Scope exam at next visit to reassess vocal fold condition. Indications and consent:   The patient presents to the clinic with hoarseness. All the risks, benefits, and potential complications were reviewed with the patient preoperatively and informed verbal consent was obtained.    INSTRUMENTAL SWALLOW STUDY FINDINGS (MBSS) Clinical Impression Objective swallow impairments: Pt demonstrates a mild dysphagia following a history of base of tongue cancer with radiation therapy. Pt reports dry mouth, frequent coughing with liquids and sometimes solids, sensation of food clinging in throat and occasions of nasal regurgitation. He has not had pna or any overt choking episodes. Under fluoroscopy pt demonstrates delayed swallowing intiation (pyriform sinuses), loss of volume in the base of tongue and minimally visible epiglottis without inversion, decreased tension of the velum, reduced ROM of hyoid excursion and laryngeal elevation. Functionally these difference result in penetration before the swallow with partially open vesituble during the swallow. Also instances of trace lingual and base of tongue residue penetrating the airway post swallow. There is accumulation of silent penetrate and trace silent aspiration. Nectar thick penetration does elicit a cough. Best strategy during testing was a super supraglottic swallow and extra swallow (take a sip, hold it, hold your breath, swallow, cough, swallow again). Pt also required a liquid wash with solids due to moderate base of tongue/vallcular residue. There were some instances of trace backflow from the PES to the pyriform sinuses. Esophageal sweep unremarkable, but esophagram after MBS is a complete assessment. Recommend pt continue foods and drink of choice, but advaice f/u with OP SLP to train strategy and resume HEP for weakness and  ROM, education regarding pulmonary hygiene etc. Swallowing Evaluation Recommendations: Recommendations: PO diet PO Diet Recommendation: Regular; Thin liquids (Level 0) Liquid Administration via: Cup; Straw Medication Administration: Whole meds with liquid Supervision: Patient able to self-feed Swallowing strategies: Slow rate; Small bites/sips; Follow solids with liquids; Hold breath during swallow, followed by a cough (super supraglottic swallow)     PATIENT REPORTED OUTCOME MEASURES (PROM): EAT-10: administered 04/06/23 , with pt scoring himself 15/40 with lower scores indicating better QOL in light of swallowing ability.                                                                                                                            TREATMENT DATE:   06/10/23: Pt has been completing HEP  as prescribed - brought tracker in for SLP to see. SLP req'd to provide initial cues for tongue protrusion but afterwards was independent. He ate crackers and drank water  today without overt s/sx oral or pharyngeal deficits. Followed precautions from MBS without cues needed. No overt s/sx aspiration PNA observed or reported. SLP shared pt should cont with HEP as prescribed until next visit in May then decr to at least x3/week. He could d/c next session with PROM, and of s/sx difficulty swallowing for the future.  05/13/23: Pt told SLP he has completed HEP as directed 6-7 days out of the last 13. SLP reiterated 5/7 days per week pt needed to get 30 reps of each exercise in to have the best chance to improve his swallow status. Also reiterated rationale for HEP and how it is prescribed. Ernestina Headland completed 10 reps of each exercise independently. SLP suggested pt track his completion on his phone or with a written tracker sheet which SLP provided him today.  He followed swallow precautions independently. At this time pt and SLP agree pt should be seen once/month to verify he cont to perform exercises correctly,  and assess need for follow up MBS.   04/29/23: Prior to ST, pt stopped taking his multivitamin due to pharyngeal clearance issues. Now he has returned to taking his multivitamin again. Pt was independent with HEP as well as POs following precautions. He was provided with vocal hygiene handout today, to be reviewed next session.   04/20/23: S/Sx aspiration PNA provided today. Pt told SLP 3 with modified independence. With precautions (fig bar and water ) pt was independent. No overt s/sx difficulty today with POs. With HEP, pt was independent.  If things look the same next week he could decr frequency to once every other week.   04/13/23: Needs s/sx aspiration PNA next visit. Pt has performed HEP 4 days since last session. SLP again strongly reiterated at least 6 days/week, and 30 reps each exercise/day. Tongue press was independent; pt req'd rare min A for Masako for tongue protrusion. He again req'd initial cue for 1-2 drops of liquid for assistance triggering a swallow.  He ate peanut butter crackers with independence following strategies except for last bite of cracker took sip at the same time and coughed after 2 subsequent chews. SLP informed pt he should take 1-2 drops of liquid should he require it due to xerostomia and explained rationale for this to pt. Ryelan demonstrated understanding.   04/06/23: Pt has been doing HEP intermittently but not 6 days/week. Belinda reports Masako is more difficult due to xerostomia. SLP strongly reiterated HEP 6 days/week, 30 reps of each exercise. Today, pt req'd initial mod cues for tongue press as pt was pressing tip to roof of mouth, and initial min A for taking DROPS of liquid to achieve Masako safely. Pt independent by session end.  He ate peanut butter crackers and drank water  with initial cues for taking and holding breath with supersupraglottic. Independent by session end. Pt without additional questions.   03/16/23 (eval): Research states the risk for dysphagia  increases due to radiation and/or chemotherapy treatment due to a variety of factors, so SLP re-educated the pt that his is likely the reason for reduced/limited swallow safety/ability after rad tx. SLP informed pt why this would be detrimental to their swallowing status and to their pulmonary health. Pt demonstrated understanding of these things to SLP. SLP then initiated an individualized HEP for pt involving oral and pharyngeal strengthening and ROM based upon  the results from the [facility] in December 2024. Pt was provided with Masako and tongue press today and will be provided more exercises next time. Pt was re-instructed how to perform each of these exercises, including SLP demonstration. After SLP demonstration, pt return demonstrated and SLP ensured pt performance was correct. Pt required rare min cues faded to modified independent for Masako, was independent with model with tongue press, and was told he will have 2-3 other exercises added next session. Pt was instructed to complete this program 6/7 days/week, at least 30 reps/day for 12 weeks, and then x2 a week after that, indefinitely.   PATIENT EDUCATION: Education details: late effects head/neck radiation on swallow function and HEP procedure Person educated: Patient Education method: Explanation, Demonstration, Verbal cues, and Handouts Education comprehension: verbalized understanding, returned demonstration, verbal cues required, and needs further education   ASSESSMENT:  CLINICAL IMPRESSION: Patient is a 63 y.o. M who was seen today for treatment of swallowing after completing chemoradiation therapy in 2022. Today pt ate crackers and drank thin liquids with coughing as req'd for swallow precautions - no coughing due to s/sx aspiration or penetration. There were no other overt s/s oral or pharyngeal difficulty when using strategies from MBS. At this time pt swallowing appears to continue as WNL/WFL with these POs given he uses suggested  compensations above in MBS results. See "treatment date" for today's date for more details. There are no overt s/s aspiration PNA observed by SLP nor any reported by pt at this time. Pt will cont to need to be seen by SLP in order to assess the need for recommending any objective swallow assessment, and ensuring pt is correctly completing the individualized HEP. Pt will likely not require follow up MBS if he cont to perform swallow HEP as prescribed until next visit in May, then decr's frequency to at least x3/week.   OBJECTIVE IMPAIRMENTS: include voice disorder and dysphagia. These impairments are limiting patient from ADLs/IADLs, effectively communicating at home and in community, and safety when swallowing. Factors affecting potential to achieve goals and functional outcome are severity of impairments. Patient will benefit from skilled SLP services to address above impairments and improve overall function.  REHAB POTENTIAL: Good   GOALS: Goals reviewed with patient? Yes  SHORT TERM GOALS: Target date: 04/17/23, 04/25/23 (due to visits)  Pt will perform HEP with modified independence in two sessions Baseline: 04/20/23 Goal status: partially met  2.  Pt will perform swallow precautions with modified independence in two sessions Baseline: 04/20/23 Goal status: partially met  3.  Pt will tell SLP 3 overt s/sx aspiration PNA with mod I Baseline:  Goal status: met   LONG TERM GOALS: Target date: 05/15/23, 06/28/23  Pt will improve PROM from initial administration Baseline:  Goal status: INITIAL  Pt will perform HEP with independence in two sessions Baseline:04/29/23 Goal status: met  2.  Pt will perform swallow precautions with independence in two sessions Baseline: 04/29/23 Goal status: met  4.  Pt will demo understanding of good/optimal vocal hygiene with mod I in two sessions Baseline:  Goal status: INITIAL  5.  Pt will demo knowledge of how to decr frequency of HEP production   Baseline:  Goal status: INITIAL  PLAN:  SLP FREQUENCY:  once approx every 4 weeks  SLP DURATION:  8 total  sessions  PLANNED INTERVENTIONS: Aspiration precaution training, Pharyngeal strengthening exercises, Diet toleration management , Trials of upgraded texture/liquids, SLP instruction and feedback, Compensatory strategies, Patient/family education, [phone] Treatment of  speech (30 or 45 min) , and [id number] Treatment of swallowing function    Jazmon Kos, CCC-SLP 06/10/2023, 3:20 PM

## 2023-07-08 ENCOUNTER — Ambulatory Visit: Payer: 59 | Attending: Otolaryngology

## 2023-07-08 DIAGNOSIS — R490 Dysphonia: Secondary | ICD-10-CM | POA: Diagnosis present

## 2023-07-08 DIAGNOSIS — R1313 Dysphagia, pharyngeal phase: Secondary | ICD-10-CM | POA: Insufficient documentation

## 2023-07-08 NOTE — Therapy (Signed)
 OUTPATIENT SPEECH LANGUAGE PATHOLOGY ONCOLOGY TREATMENT  Patient Name: Alexander [name] MRN: [id number] DOB:October 08, 1960, 63 y.o., male Today's Date: 07/08/2023  PCP: Ulysees Gander, MD REFERRING PROVIDER: Artice Last, MD  END OF SESSION:        Past Medical History:  Diagnosis Date   Asthma    Cancer (HCC)    GERD (gastroesophageal reflux disease)    Sleep apnea    CPAP- does not wear   Past Surgical History:  Procedure Laterality Date   BRONCHIAL NEEDLE ASPIRATION BIOPSY  05/22/2020   Procedure: BRONCHIAL NEEDLE ASPIRATION BIOPSIES;  Surgeon: Prudy Brownie, DO;  Location: [facility];  Service: Pulmonary;;   DIRECT LARYNGOSCOPY  05/09/2020   Procedure: DIRECT LARYNGOSCOPY;  Surgeon: Prescott Brodie, MD;  Location: [facility] OR;  Service: ENT;;   IR GASTROSTOMY TUBE MOD SED  09/13/2020   IR GASTROSTOMY TUBE REMOVAL  12/14/2020   IR IMAGING GUIDED PORT INSERTION  06/12/2020   IR REMOVAL TUN ACCESS W/ PORT W/O FL MOD SED  01/09/2023   MASS EXCISION N/A 05/09/2020   Procedure: EXCISION TONGUE MASS;  Surgeon: Prescott Brodie, MD;  Location: [facility] OR;  Service: ENT;  Laterality: N/A;   TONSILLECTOMY     TRACHEOSTOMY TUBE PLACEMENT  05/09/2020   Procedure: TRACHEOSTOMY;  Surgeon: Prescott Brodie, MD;  Location: [facility] OR;  Service: ENT;;   VIDEO BRONCHOSCOPY WITH ENDOBRONCHIAL ULTRASOUND N/A 05/22/2020   Procedure: VIDEO BRONCHOSCOPY WITH ENDOBRONCHIAL ULTRASOUND;  Surgeon: Prudy Brownie, DO;  Location: [facility];  Service: Pulmonary;  Laterality: N/A;   Patient Active Problem List   Diagnosis Date Noted   Moderate persistent asthma without complication 02/09/2023   Non-seasonal allergic rhinitis due to pollen 02/09/2023   Right knee pain 12/23/2022   Encounter for Medicare annual wellness exam 12/23/2022   Phobia of dental procedure 08/06/2021   Chemotherapy-induced peripheral neuropathy (HCC) 05/28/2021   Encounter for dental examination and cleaning without  abnormal findings 03/08/2021   Teeth missing 03/08/2021   Caries 03/08/2021   Defective dental restoration 03/08/2021   Excessive dental attrition 03/08/2021   Abfraction 03/08/2021   Diastema 03/08/2021   Malocclusion 03/08/2021   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   Incipient enamel caries 03/08/2021   Chronic periodontal disease 03/08/2021   Acquired lymphedema 01/11/2021   History of head and neck radiation 01/07/2021   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   Dysgeusia 01/07/2021   Accretions on teeth 01/07/2021   Pancytopenia, acquired (HCC) 12/07/2020   Dry mouth 12/07/2020   Leukopenia due to antineoplastic chemotherapy (HCC) 11/08/2020   Port-A-Cath in place 10/03/2020   Intractable hiccups 10/03/2020   Thrombocytopenia (HCC) 10/03/2020   Chemotherapy-induced fatigue 09/27/2020   Weight loss, unintentional 06/01/2020   Malnutrition of moderate degree 05/28/2020   GERD (gastroesophageal reflux disease)    Volume depletion    Hyperbilirubinemia    Hyponatremia    Mild protein-calorie malnutrition (HCC)    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  Pt adhered to swallow precautions from [facility] 12/'24 with independence, and completed HEP with independence on last session today. He and SLP agree pt is appropriate for d/c today.    Remaining deficits: Mild dysphagia possible, not threatening pt's pulmonary health, but mitigated as pt follows  swallow precautions    Education / Equipment: See individual therapy session notes for details.    Patient agrees to discharge. Patient goals were partially met. Patient is being discharged due to being pleased with the current functional level..    ONSET DATE: 2022  but most recently, Fall 2024)   REFERRING DIAG: dysphagia, unspecified  THERAPY DIAG:  Dysphagia, pharyngeal phase  Hoarseness  Rationale for Evaluation and Treatment: Rehabilitation  SUBJECTIVE:   SUBJECTIVE STATEMENT: Pt has maintained HEP x5 days/week since last session except 2 days when he was at funeral.  Pt accompanied by: self  PERTINENT HISTORY:  Alexander [name] is a 63 y.o. 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 Patient completed definitive chemoradiation to his base of tongue and subcarinal lymph node in September 2022. He has routine visits to ENT for check on status, last being stable, reporting the following: pt with postradiation changes noted in the neck and no palpable adenopathy. Patient endorses occasional aspiration, primarily with eating quickly. No recent history of pneumonia. He was prescribed Pepcid  by laryngology, which he has not been taking as he states rationale for the medication was not prescribed to him and he does not have symptoms of GERD. he was advised to take this to improve symptoms. He had an updated CT neck with contrast on 12/12/2022, which did not reveal any evidence of recurrent disease. He saw Dr. Soldatova on 11/06 for his dysphagia symptoms, and had video stroboscopy at that visit which demonstrated no evidence of masses or lesions, with moderate postcricoid edema, vocal fold atrophy and mild thickening and edema of bilateral vocal folds, secondary to XRT. Patient still experiences occasional symptoms of aspiration, which he states primarily occurs when he is eating quickly. He has not been treated for any recent episodes of pneumonia and his weight has been stable. MBS was completed 01/28/23.  PAIN:  Are you having pain? No  FALLS: Has patient fallen in last 6 months?  No  PATIENT GOALS: Improve swallowing  OBJECTIVE:   Note: Objective measures were completed at Evaluation unless otherwise noted. DIAGNOSTIC FINDINGS:  ENT -SOLDATOVA 03/02/23: Chronic dysphonia and dysphagia s/p XRT for SCCa of the L BOT, s/p cXRT EOT 2022.  Procedure: The patient was seated upright in the exam chair.   Topical lidocaine  and Afrin were applied to the nasal cavity. After adequate anesthesia had occurred, the flexible telescope was passed into the nasal cavity. The nasopharynx was patent without mass or lesion. The scope was passed behind the soft palate and directed toward the base of tongue. The base of tongue was visualized and was symmetric with no apparent masses or abnormal appearing tissue. There were no signs of a mass or pooling of secretions in the piriform sinuses. The supraglottic structures were normal.   The true vocal cords are mobile. The medial edges were straight. Closure was incomplete with supraglottic compression. Periodicity present. The mucosal wave and amplitude were decreased. There is moderate interarytenoid pachydermia and post cricoid edema. The mucosa appears without lesions.   The laryngoscope was then slowly withdrawn and the patient tolerated the procedure well. There were no complications or blood loss.  Assessment and Plan Dysphagia Chronic dysphagia post-radiation therapy for head and neck cancer. Esophagram showed slight slowing of barium passage but no frank aspiration. Speech therapy assessment indicated minor penetration and trace aspiration. No significant  weight loss or hx of pneumonia noted. Swallow function has not significantly worsened since post-treatment per report.  - refer for swallow therapy - Advised maintaining good oral hygiene and regular dental visits - Encouraged staying active and following speech therapy recommendations   Chronic Hoarseness  Discussed potential future consideration of vocal fold filler if hoarseness persists. - Repeat scope exam when he returns - Consider  vocal fold injection augmentation in the future    Gastroesophageal Reflux Disease (GERD) GERD managed with omeprazole. No significant improvement with Pepcid . Discussed long-term side effects of omeprazole, including bone density reduction and increased risk of community-acquired pneumonia. Preference for Pepcid  due to lower risk profile. Discussed dietary modifications and the use of Reflux Gourmet as an alternative approach. - Continue Pepcid  20 mg daily for now - Recommend trying Reflux Gourmet - Advise slowly weaning off Pepcid  if symptoms are controlled or get better   Follow-up - Schedule follow-up appointment in three months - Scope exam at next visit to reassess vocal fold condition. Indications and consent:   The patient presents to the clinic with hoarseness. All the risks, benefits, and potential complications were reviewed with the patient preoperatively and informed verbal consent was obtained.    INSTRUMENTAL SWALLOW STUDY FINDINGS (MBSS) Clinical Impression Objective swallow impairments: Pt demonstrates a mild dysphagia following a history of base of tongue cancer with radiation therapy. Pt reports dry mouth, frequent coughing with liquids and sometimes solids, sensation of food clinging in throat and occasions of nasal regurgitation. He has not had pna or any overt choking episodes. Under fluoroscopy pt demonstrates delayed swallowing intiation (pyriform sinuses), loss of volume in the base of tongue and minimally visible epiglottis without inversion, decreased tension of the velum, reduced ROM of hyoid excursion and laryngeal elevation. Functionally these difference result in penetration before the swallow with partially open vesituble during the swallow. Also instances of trace lingual and base of tongue residue penetrating the airway post swallow. There is accumulation of silent penetrate and trace silent aspiration. Nectar thick penetration does elicit a cough. Best strategy during  testing was a super supraglottic swallow and extra swallow (take a sip, hold it, hold your breath, swallow, cough, swallow again). Pt also required a liquid wash with solids due to moderate base of tongue/vallcular residue. There were some instances of trace backflow from the PES to the pyriform sinuses. Esophageal sweep unremarkable, but esophagram after MBS is a complete assessment. Recommend pt continue foods and drink of choice, but advaice f/u with OP SLP to train strategy and resume HEP for weakness and ROM, education regarding pulmonary hygiene etc. Swallowing Evaluation Recommendations: Recommendations: PO diet PO Diet Recommendation: Regular; Thin liquids (Level 0) Liquid Administration via: Cup; Straw Medication Administration: Whole meds with liquid Supervision: Patient able to self-feed Swallowing strategies: Slow rate; Small bites/sips; Follow solids with liquids; Hold breath during swallow, followed by a cough (super supraglottic swallow)     PATIENT REPORTED OUTCOME MEASURES (PROM): EAT-10: administered 04/06/23, with pt scoring himself 15/40 with lower scores indicating better QOL in light of swallowing ability.  TREATMENT DATE:   07/08/23: Pt brought his tracker again for SLP to see - missed two days when traveling for a funeral but otherwise 100% keeping up with HEP. SLP communicated pt can decr to x3/week at this time indefinitely for swallowing health. HEP was completed with independence. SLP added Kathlen Para to his regimen and pt completed with independence 3/3 following SLP model. Pt followed precautions from MBS with independence with crackers and water  today. No coughing episodes with POs that he can recall in the last 4 weeks. SLP and pt agreed he is appropriate for d/c today. SLP will leave it to referring MD to order follow up MBS if she thinks it is  necessary. At this time pt is satisfied with his swallowing status following swallow precautions from latest MBS from December 2024.  06/10/23: Pt has been completing HEP as prescribed - brought tracker in for SLP to see. SLP req'd to provide initial cues for tongue protrusion but afterwards was independent. He ate crackers and drank water  today without overt s/sx oral or pharyngeal deficits. Followed precautions from MBS without cues needed. No overt s/sx aspiration PNA observed or reported. SLP shared pt should cont with HEP as prescribed until next visit in May then decr to at least x3/week. He could d/c next session with PROM, and of s/sx difficulty swallowing for the future.  05/13/23: Pt told SLP he has completed HEP as directed 6-7 days out of the last 13. SLP reiterated 5/7 days per week pt needed to get 30 reps of each exercise in to have the best chance to improve his swallow status. Also reiterated rationale for HEP and how it is prescribed. Alexander [name] completed 10 reps of each exercise independently. SLP suggested pt track his completion on his phone or with a written tracker sheet which SLP provided him today.  He followed swallow precautions independently. At this time pt and SLP agree pt should be seen once/month to verify he cont to perform exercises correctly, and assess need for follow up MBS.   04/29/23: Prior to ST, pt stopped taking his multivitamin due to pharyngeal clearance issues. Now he has returned to taking his multivitamin again. Pt was independent with HEP as well as POs following precautions. He was provided with vocal hygiene handout today, to be reviewed next session.   04/20/23: S/Sx aspiration PNA provided today. Pt told SLP 3 with modified independence. With precautions (fig bar and water ) pt was independent. No overt s/sx difficulty today with POs. With HEP, pt was independent.  If things look the same next week he could decr frequency to once every other week.   04/13/23:  Needs s/sx aspiration PNA next visit. Pt has performed HEP 4 days since last session. SLP again strongly reiterated at least 6 days/week, and 30 reps each exercise/day. Tongue press was independent; pt req'd rare min A for Masako for tongue protrusion. He again req'd initial cue for 1-2 drops of liquid for assistance triggering a swallow.  He ate peanut butter crackers with independence following strategies except for last bite of cracker took sip at the same time and coughed after 2 subsequent chews. SLP informed pt he should take 1-2 drops of liquid should he require it due to xerostomia and explained rationale for this to pt. Alexander [name] demonstrated understanding.   04/06/23: Pt has been doing HEP intermittently but not 6 days/week. Alexander [name] reports Masako is more difficult due to xerostomia. SLP strongly reiterated HEP 6 days/week, 30 reps of each exercise. Today, pt  req'd initial mod cues for tongue press as pt was pressing tip to roof of mouth, and initial min A for taking DROPS of liquid to achieve Masako safely. Pt independent by session end.  He ate peanut butter crackers and drank water  with initial cues for taking and holding breath with supersupraglottic. Independent by session end. Pt without additional questions.   03/16/23 (eval): Research states the risk for dysphagia increases due to radiation and/or chemotherapy treatment due to a variety of factors, so SLP re-educated the pt that his is likely the reason for reduced/limited swallow safety/ability after rad tx. SLP informed pt why this would be detrimental to their swallowing status and to their pulmonary health. Pt demonstrated understanding of these things to SLP. SLP then initiated an individualized HEP for pt involving oral and pharyngeal strengthening and ROM based upon the results from the MBS in December 2024. Pt was provided with Masako and tongue press today and will be provided more exercises next time. Pt was re-instructed how to perform  each of these exercises, including SLP demonstration. After SLP demonstration, pt return demonstrated and SLP ensured pt performance was correct. Pt required rare min cues faded to modified independent for Masako, was independent with model with tongue press, and was told he will have 2-3 other exercises added next session. Pt was instructed to complete this program 6/7 days/week, at least 30 reps/day for 12 weeks, and then x2 a week after that, indefinitely.   PATIENT EDUCATION: Education details: late effects head/neck radiation on swallow function and HEP procedure Person educated: Patient Education method: Explanation, Demonstration, Verbal cues, and Handouts Education comprehension: verbalized understanding, returned demonstration, verbal cues required, and needs further education   ASSESSMENT:  CLINICAL IMPRESSION: Patient is a 63 y.o. M who was seen today for treatment of swallowing after completing chemoradiation therapy in 2022. Today pt ate crackers and drank thin liquids with coughing as req'd for swallow precautions - no coughing due to s/sx aspiration or penetration. There were no other overt s/s oral or pharyngeal difficulty when using strategies from MBS. At this time pt swallowing appears to continue as WNL/WFL with these POs given he uses suggested compensations above in MBS results. See "treatment date" for today's date for more details. There are no overt s/s aspiration PNA observed by SLP nor any reported by pt at this time. Pt will cont to need to be seen by SLP in order to assess the need for recommending any objective swallow assessment, and ensuring pt is correctly completing the individualized HEP. Pt will likely not require follow up MBS if he cont to perform swallow HEP as prescribed until next visit in May, then decr's frequency to at least x3/week.   OBJECTIVE IMPAIRMENTS: include voice disorder and dysphagia. These impairments are limiting patient from ADLs/IADLs,  effectively communicating at home and in community, and safety when swallowing. Factors affecting potential to achieve goals and functional outcome are severity of impairments. Patient will benefit from skilled SLP services to address above impairments and improve overall function.  REHAB POTENTIAL: Good   GOALS: Goals reviewed with patient? Yes  SHORT TERM GOALS: Target date: 04/17/23, 04/25/23 (due to visits)  Pt will perform HEP with modified independence in two sessions Baseline: 04/20/23 Goal status: partially met  2.  Pt will perform swallow precautions with modified independence in two sessions Baseline: 04/20/23 Goal status: partially met  3.  Pt will tell SLP 3 overt s/sx aspiration PNA with mod I Baseline:  Goal status: met  LONG TERM GOALS: Target date: 05/15/23, 06/28/23  Pt will improve PROM from initial administration Baseline:  Goal status: INITIAL  Pt will perform HEP with independence in two sessions Baseline:04/29/23 Goal status: met  2.  Pt will perform swallow precautions with independence in two sessions Baseline: 04/29/23 Goal status: met  4.  Pt will demo understanding of good/optimal vocal hygiene with mod I in two sessions Baseline:  Goal status: partially met  5.  Pt will demo knowledge of how to decr frequency of HEP production  Baseline:  Goal status: met  PLAN:  d/c today   PLANNED INTERVENTIONS: Aspiration precaution training, Pharyngeal strengthening exercises, Diet toleration management , Trials of upgraded texture/liquids, SLP instruction and feedback, Compensatory strategies, Patient/family education, [id number] Treatment of speech (30 or 45 min) , and [id number] Treatment of swallowing function    Aniella Wandrey, CCC-SLP 07/08/2023, 2:12 PM

## 2023-07-08 NOTE — Patient Instructions (Signed)
 SWALLOWING EXERCISES You can use 1-2 drops of liquid to help you swallow, if your mouth gets dry  Mendelsohn Maneuver - "squeeze swallow" exercise - Swallow, and squeeze tight to keep your Adam's Apple up - Hold the squeeze for 5-7 seconds - then relax - Do at least 30 reps/day, in sets of 10

## 2023-10-20 ENCOUNTER — Other Ambulatory Visit: Payer: Self-pay | Admitting: Pulmonary Disease

## 2023-11-04 ENCOUNTER — Encounter: Payer: Self-pay | Admitting: Hematology and Oncology

## 2023-11-11 NOTE — Telephone Encounter (Signed)
 error

## 2023-11-12 ENCOUNTER — Other Ambulatory Visit (HOSPITAL_BASED_OUTPATIENT_CLINIC_OR_DEPARTMENT_OTHER): Payer: PRIVATE HEALTH INSURANCE

## 2023-11-12 ENCOUNTER — Other Ambulatory Visit (HOSPITAL_BASED_OUTPATIENT_CLINIC_OR_DEPARTMENT_OTHER): Payer: Self-pay | Admitting: *Deleted

## 2023-11-12 DIAGNOSIS — Z125 Encounter for screening for malignant neoplasm of prostate: Secondary | ICD-10-CM

## 2023-11-12 DIAGNOSIS — Z0000 Encounter for general adult medical examination without abnormal findings: Secondary | ICD-10-CM

## 2023-11-13 ENCOUNTER — Ambulatory Visit (HOSPITAL_BASED_OUTPATIENT_CLINIC_OR_DEPARTMENT_OTHER): Payer: Self-pay | Admitting: Family Medicine

## 2023-11-13 LAB — CBC WITH DIFFERENTIAL/PLATELET
Basophils Absolute: 0.1 x10E3/uL (ref 0.0–0.2)
Basos: 1 %
EOS (ABSOLUTE): 0.2 x10E3/uL (ref 0.0–0.4)
Eos: 3 %
Hematocrit: 44.4 % (ref 37.5–51.0)
Hemoglobin: 14.5 g/dL (ref 13.0–17.7)
Immature Grans (Abs): 0 x10E3/uL (ref 0.0–0.1)
Immature Granulocytes: 0 %
Lymphocytes Absolute: 1.1 x10E3/uL (ref 0.7–3.1)
Lymphs: 17 %
MCH: 31.1 pg (ref 26.6–33.0)
MCHC: 32.7 g/dL (ref 31.5–35.7)
MCV: 95 fL (ref 79–97)
Monocytes Absolute: 0.5 x10E3/uL (ref 0.1–0.9)
Monocytes: 8 %
Neutrophils Absolute: 4.6 x10E3/uL (ref 1.4–7.0)
Neutrophils: 71 %
Platelets: 153 x10E3/uL (ref 150–450)
RBC: 4.66 x10E6/uL (ref 4.14–5.80)
RDW: 13.8 % (ref 11.6–15.4)
WBC: 6.5 x10E3/uL (ref 3.4–10.8)

## 2023-11-13 LAB — COMPREHENSIVE METABOLIC PANEL WITH GFR
ALT: 15 IU/L (ref 0–44)
AST: 17 IU/L (ref 0–40)
Albumin: 4.1 g/dL (ref 3.9–4.9)
Alkaline Phosphatase: 56 IU/L (ref 47–123)
BUN/Creatinine Ratio: 12 (ref 10–24)
BUN: 16 mg/dL (ref 8–27)
Bilirubin Total: 0.5 mg/dL (ref 0.0–1.2)
CO2: 24 mmol/L (ref 20–29)
Calcium: 9.4 mg/dL (ref 8.6–10.2)
Chloride: 103 mmol/L (ref 96–106)
Creatinine, Ser: 1.36 mg/dL — ABNORMAL HIGH (ref 0.76–1.27)
Globulin, Total: 2.1 g/dL (ref 1.5–4.5)
Glucose: 96 mg/dL (ref 70–99)
Potassium: 4.3 mmol/L (ref 3.5–5.2)
Sodium: 140 mmol/L (ref 134–144)
Total Protein: 6.2 g/dL (ref 6.0–8.5)
eGFR: 58 mL/min/1.73 — ABNORMAL LOW (ref >59)

## 2023-11-13 LAB — LIPID PANEL
Chol/HDL Ratio: 2.5 ratio (ref 0.0–5.0)
Cholesterol, Total: 161 mg/dL (ref 100–199)
HDL: 65 mg/dL (ref >39)
LDL Chol Calc (NIH): 76 mg/dL (ref 0–99)
Triglycerides: 111 mg/dL (ref 0–149)
VLDL Cholesterol Cal: 20 mg/dL (ref 5–40)

## 2023-11-13 LAB — TSH RFX ON ABNORMAL TO FREE T4: TSH: 2.66 u[IU]/mL (ref 0.450–4.500)

## 2023-11-13 LAB — HEMOGLOBIN A1C
Est. average glucose Bld gHb Est-mCnc: 120 mg/dL
Hgb A1c MFr Bld: 5.8 % — ABNORMAL HIGH (ref 4.8–5.6)

## 2023-11-13 LAB — PSA TOTAL (REFLEX TO FREE): Prostate Specific Ag, Serum: 1.2 ng/mL (ref 0.0–4.0)

## 2023-11-19 ENCOUNTER — Ambulatory Visit (INDEPENDENT_AMBULATORY_CARE_PROVIDER_SITE_OTHER): Payer: PRIVATE HEALTH INSURANCE | Admitting: Family Medicine

## 2023-11-19 ENCOUNTER — Encounter (HOSPITAL_BASED_OUTPATIENT_CLINIC_OR_DEPARTMENT_OTHER): Payer: Self-pay | Admitting: Family Medicine

## 2023-11-19 VITALS — BP 118/72 | HR 77 | Ht 75.0 in | Wt 303.0 lb

## 2023-11-19 DIAGNOSIS — Z0000 Encounter for general adult medical examination without abnormal findings: Secondary | ICD-10-CM | POA: Diagnosis not present

## 2023-11-19 DIAGNOSIS — Z23 Encounter for immunization: Secondary | ICD-10-CM | POA: Diagnosis not present

## 2023-11-19 NOTE — Assessment & Plan Note (Addendum)
 Routine HCM labs reviewed. HCM reviewed/discussed. Anticipatory guidance regarding healthy weight, lifestyle and choices given. Recommend healthy diet.  Recommend approximately 150 minutes/week of moderate intensity exercise Recommend regular dental and vision exams Always use seatbelt/lap and shoulder restraints Recommend using smoke alarms and checking batteries at least twice a year Recommend using sunscreen when outside Discussed colon cancer screening recommendations, options.  Patient is UTD - due 2028 Discussed immunization recommendations

## 2023-11-19 NOTE — Patient Instructions (Signed)

## 2023-11-19 NOTE — Progress Notes (Signed)
 Subjective:    CC: Annual Physical Exam  HPI: Alexander [name] is a 63 y.o. presenting for annual physical  I reviewed the past medical history, family history, social history, surgical history, and allergies today and no changes were needed.  Please see the problem list section below in epic for further details.  Past Medical History: Past Medical History:  Diagnosis Date   Asthma    Cancer (HCC)    GERD (gastroesophageal reflux disease)    Sleep apnea    CPAP- does not wear   Past Surgical History: Past Surgical History:  Procedure Laterality Date   BRONCHIAL NEEDLE ASPIRATION BIOPSY  05/22/2020   Procedure: BRONCHIAL NEEDLE ASPIRATION BIOPSIES;  Surgeon: Brenna Adine CROME, DO;  Location: [facility];  Service: Pulmonary;;   DIRECT LARYNGOSCOPY  05/09/2020   Procedure: DIRECT LARYNGOSCOPY;  Surgeon: Ethyl Lonni BRAVO, MD;  Location: [facility] OR;  Service: ENT;;   IR GASTROSTOMY TUBE MOD SED  09/13/2020   IR GASTROSTOMY TUBE REMOVAL  12/14/2020   IR IMAGING GUIDED PORT INSERTION  06/12/2020   IR REMOVAL TUN ACCESS W/ PORT W/O FL MOD SED  01/09/2023   MASS EXCISION N/A 05/09/2020   Procedure: EXCISION TONGUE MASS;  Surgeon: Ethyl Lonni BRAVO, MD;  Location: [facility] OR;  Service: ENT;  Laterality: N/A;   TONSILLECTOMY     TRACHEOSTOMY TUBE PLACEMENT  05/09/2020   Procedure: TRACHEOSTOMY;  Surgeon: Ethyl Lonni BRAVO, MD;  Location: [facility] OR;  Service: ENT;;   VIDEO BRONCHOSCOPY WITH ENDOBRONCHIAL ULTRASOUND N/A 05/22/2020   Procedure: VIDEO BRONCHOSCOPY WITH ENDOBRONCHIAL ULTRASOUND;  Surgeon: Brenna Adine CROME, DO;  Location: [facility];  Service: Pulmonary;  Laterality: N/A;   Social History: Social History   Socioeconomic History   Marital status: Married    Spouse name: Not on file   Number of children: Not on file   Years of education: Not on file   Highest education level: Not on file  Occupational History   Not on file  Tobacco Use   Smoking status: Never    Passive  exposure: Never   Smokeless tobacco: Never  Vaping Use   Vaping status: Never Used  Substance and Sexual Activity   Alcohol use: Not Currently    Comment: 4-5 drinks a year for special ocassions 05/08/20   Drug use: Never   Sexual activity: Not Currently  Other Topics Concern   Not on file  Social History Narrative   Not on file   Social Drivers of Health   Financial Resource Strain: Low Risk  (12/23/2022)   Overall Financial Resource Strain (CARDIA)    Difficulty of Paying Living Expenses: Not hard at all  Food Insecurity: Low Risk  (06/17/2023)   Received from [facility]   Hunger Vital Sign    Within the past 12 months, you worried that your food would run out before you got money to buy more: Never true    Within the past 12 months, the food you bought just didn't last and you didn't have money to get more. : Never true  Transportation Needs: No Transportation Needs (06/17/2023)   Received from [facility]    In the past 12 months, has lack of reliable transportation kept you from medical appointments, meetings, work or from getting things needed for daily living? : No  Physical Activity: Insufficiently Active (12/23/2022)   Exercise Vital Sign    Days of Exercise per Week: 3 days    Minutes of Exercise per Session: 30  min  Stress: No Stress Concern Present (12/23/2022)   [employer] of Occupational Health - Occupational Stress Questionnaire    Feeling of Stress : Not at all  Social Connections: Socially Integrated (12/23/2022)   Social Connection and Isolation Panel    Frequency of Communication with Friends and Family: More than three times a week    Frequency of Social Gatherings with Friends and Family: More than three times a week    Attends Religious Services: More than 4 times per year    Active Member of [employer] or Organizations: Yes    Attends Engineer, structural: More than 4 times per year    Marital Status: Married   Family  History: Family History  Problem Relation Age of Onset   Breast cancer Mother    Cancer - Cervical Mother    Colon cancer Father    Colon cancer Brother    Allergies: Allergies  Allergen Reactions   Tessalon [Benzonatate]    Medications: See med rec.  Review of Systems: No headache, visual changes, nausea, vomiting, diarrhea, constipation, dizziness, abdominal pain, skin rash, fevers, chills, night sweats, swollen lymph nodes, weight loss, chest pain, body aches, joint swelling, muscle aches, shortness of breath, mood changes, visual or auditory hallucinations.  Objective:    BP 118/72 (BP Location: Left Arm, Patient Position: Sitting, Cuff Size: Large)   Pulse 77   Ht 6' 3 (1.905 m)   Wt (!) 303 lb (137.4 kg)   SpO2 95%   BMI 37.87 kg/m   General: Well Developed, well nourished, and in no acute distress.  Neuro: Alert and oriented x3, extra-ocular muscles intact, sensation grossly intact. Cranial nerves II through XII are intact, motor, sensory, and coordinative functions are all intact. HEENT: Normocephalic, atraumatic, pupils equal round reactive to light, neck supple, no masses, no lymphadenopathy, thyroid  nonpalpable. Oropharynx, nasopharynx, external ear canals are unremarkable. Skin: Warm and dry, no rashes noted.  Cardiac: Regular rate and rhythm, no murmurs rubs or gallops.  Respiratory: Clear to auscultation bilaterally. Not using accessory muscles, speaking in full sentences.  Abdominal: Soft, nontender, nondistended, positive bowel sounds, no masses, no organomegaly.  Musculoskeletal: Shoulder, elbow, wrist, hip, knee, ankle stable, and with full range of motion.  Impression and Recommendations:    Wellness examination Assessment & Plan: Routine HCM labs reviewed. HCM reviewed/discussed. Anticipatory guidance regarding healthy weight, lifestyle and choices given. Recommend healthy diet.  Recommend approximately 150 minutes/week of moderate intensity  exercise Recommend regular dental and vision exams Always use seatbelt/lap and shoulder restraints Recommend using smoke alarms and checking batteries at least twice a year Recommend using sunscreen when outside Discussed colon cancer screening recommendations, options.  Patient is UTD - due 2028 Discussed immunization recommendations   Encounter for immunization -     Flu vaccine trivalent PF, 6mos and older(Flulaval,Afluria,Fluarix,Fluzone)  Patient does have some questions today about medications to assist with weight loss.  He specifically has questions about GLP-1 receptor agonist and whether he would be a candidate for these.  We did reviewed general considerations related to weight loss medications.  Discussed indications.  He ultimately would be a candidate from a medical standpoint to utilize medication to help with weight loss.  Did discuss potential insurance limitations.  He does plan to contact his insurance company to identify if he does have weight loss benefit available.  Once he finds out this information, he may reach back out to us  if he is interested in starting medication and we can consider  doing so.  Return in about 6 months (around 05/19/2024).   ___________________________________________ Milia Warth de [place], MD, ABFM, CAQSM Primary Care and Sports Medicine [address]

## 2023-12-07 ENCOUNTER — Encounter: Payer: Self-pay | Admitting: Hematology and Oncology

## 2023-12-09 ENCOUNTER — Ambulatory Visit (HOSPITAL_COMMUNITY)
Admission: RE | Admit: 2023-12-09 | Discharge: 2023-12-09 | Disposition: A | Discharge status: Home / Self Care | Payer: 59 | Source: Ambulatory Visit | Attending: Hematology and Oncology | Admitting: Hematology and Oncology

## 2023-12-09 DIAGNOSIS — C7989 Secondary malignant neoplasm of other specified sites: Secondary | ICD-10-CM | POA: Diagnosis not present

## 2023-12-09 DIAGNOSIS — C01 Malignant neoplasm of base of tongue: Secondary | ICD-10-CM | POA: Diagnosis present

## 2023-12-09 MED ORDER — IOHEXOL 300 MG/ML  SOLN
75.0000 mL | Freq: Once | INTRAMUSCULAR | Status: AC | PRN
Start: 2023-12-09 — End: 2023-12-09
  Administered 2023-12-09: 75 mL via INTRAVENOUS

## 2023-12-12 ENCOUNTER — Ambulatory Visit: Payer: Self-pay | Admitting: Hematology and Oncology

## 2023-12-15 ENCOUNTER — Encounter: Payer: Self-pay | Admitting: Dermatology

## 2023-12-15 ENCOUNTER — Ambulatory Visit (INDEPENDENT_AMBULATORY_CARE_PROVIDER_SITE_OTHER): Admitting: Dermatology

## 2023-12-15 ENCOUNTER — Encounter: Payer: Self-pay | Admitting: Hematology and Oncology

## 2023-12-15 VITALS — BP 109/73

## 2023-12-15 DIAGNOSIS — L723 Sebaceous cyst: Secondary | ICD-10-CM | POA: Diagnosis not present

## 2023-12-15 DIAGNOSIS — L739 Follicular disorder, unspecified: Secondary | ICD-10-CM | POA: Diagnosis not present

## 2023-12-15 MED ORDER — TRIAMCINOLONE ACETONIDE 10 MG/ML IJ SUSP
2.0000 mg | Freq: Once | INTRAMUSCULAR | Status: AC
  Administered 2023-12-15: 2 mg

## 2023-12-15 MED ORDER — CLINDAMYCIN PHOSPHATE 1 % EX SWAB: 1.0000 | Freq: Every morning | CUTANEOUS | 2 refills | Status: AC

## 2023-12-15 MED ORDER — DOXYCYCLINE HYCLATE 100 MG PO TABS: ORAL_TABLET | ORAL | 2 refills | Status: AC

## 2023-12-15 NOTE — Patient Instructions (Addendum)
 Date: December 15, 2023  Hello Alexander [name],  Thank you for visiting us  today. We appreciate your commitment to improving your skin and hair health. Here is a summary of the key instructions from today's consultation:  - Prescription Medications:   - Doxycycline  100 mg: Take twice a day for flare-ups with food to avoid upset stomach   - Clindamycin swabs: Use on face every morning  - Skincare Routine:   - Wash face with CeraVe 4% benzoyl peroxide face wash morning and evening  - Apply Clindamycin swab every morning   - Do not touch or pick at the injection site   - The injection site will look bigger for the next 24 hours but will flatten over time  - Follow-Up: We will see you again in 4 months to assess your progress and make any necessary adjustments to your treatment plan. Keep your May appointment for follow-up to check if more injections are needed.  - Important Notes: Face wash samples are available. If the face wash doesn't irritate your skin, you can buy it at [facility] or [facility]. Your treatment plan will be available on MyChart.  We look forward to seeing improvement at your next visit. If you have any questions or concerns before then, please do not hesitate to contact our office through MyChart or by phone.  Warm regards,  Dr. Delon Lenis, Dermatology   Important Information  Due to recent changes in healthcare laws, you may see results of your pathology and/or laboratory studies on MyChart before the doctors have had a chance to review them. We understand that in some cases there may be results that are confusing or concerning to you. Please understand that not all results are received at the same time and often the doctors may need to interpret multiple results in order to provide you with the best plan of care or course of treatment. Therefore, we ask that you please give us  2 business days to thoroughly review all your results before contacting the office for clarification.  Should we see a critical lab result, you will be contacted sooner.   If You Need Anything After Your Visit  If you have any questions or concerns for your doctor, please call our main line at [phone] If no one answers, please leave a voicemail as directed and we will return your call as soon as possible. Messages left after 4 pm will be answered the following business day.   You may also send us  a message via MyChart. We typically respond to MyChart messages within 1-2 business days.  For prescription refills, please ask your pharmacy to contact our office. Our fax number is [phone].  If you have an urgent issue when the clinic is closed that cannot wait until the next business day, you can page your doctor at the number below.    Please note that while we do our best to be available for urgent issues outside of office hours, we are not available 24/7.   If you have an urgent issue and are unable to reach us , you may choose to seek medical care at your doctor's office, retail clinic, urgent care center, or emergency room.  If you have a medical emergency, please immediately call 911 or go to the emergency department. In the event of inclement weather, please call our main line at [phone] for an update on the status of any delays or closures.  Dermatology Medication Tips: Please keep the boxes that topical medications come in in  order to help keep track of the instructions about where and how to use these. Pharmacies typically print the medication instructions only on the boxes and not directly on the medication tubes.   If your medication is too expensive, please contact our office at [phone] or send us  a message through MyChart.   We are unable to tell what your co-pay for medications will be in advance as this is different depending on your insurance coverage. However, we may be able to find a substitute medication at lower cost or fill out paperwork to get insurance to  cover a needed medication.   If a prior authorization is required to get your medication covered by your insurance company, please allow us  1-2 business days to complete this process.  Drug prices often vary depending on where the prescription is filled and some pharmacies may offer cheaper prices.  The website www.goodrx.com contains coupons for medications through different pharmacies. The prices here do not account for what the cost may be with help from insurance (it may be cheaper with your insurance), but the website can give you the price if you did not use any insurance.  - You can print the associated coupon and take it with your prescription to the pharmacy.  - You may also stop by our office during regular business hours and pick up a GoodRx coupon card.  - If you need your prescription sent electronically to a different pharmacy, notify our office through [facility] or by phone at [phone]
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
## 2023-12-22 ENCOUNTER — Inpatient Hospital Stay: Payer: 59 | Attending: Hematology and Oncology | Admitting: Hematology and Oncology

## 2023-12-22 VITALS — BP 121/79 | HR 85 | Temp 98.3°F | Resp 18 | Wt 285.0 lb

## 2023-12-22 DIAGNOSIS — R590 Localized enlarged lymph nodes: Secondary | ICD-10-CM

## 2023-12-22 DIAGNOSIS — Z923 Personal history of irradiation: Secondary | ICD-10-CM | POA: Insufficient documentation

## 2023-12-22 DIAGNOSIS — C01 Malignant neoplasm of base of tongue: Secondary | ICD-10-CM | POA: Diagnosis not present

## 2023-12-22 DIAGNOSIS — Z9221 Personal history of antineoplastic chemotherapy: Secondary | ICD-10-CM | POA: Insufficient documentation

## 2023-12-22 DIAGNOSIS — Z85810 Personal history of malignant neoplasm of tongue: Secondary | ICD-10-CM | POA: Insufficient documentation

## 2023-12-22 NOTE — Assessment & Plan Note (Signed)
 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    He completed 4 cycles of 5-FU/carbo/Keytruda  for induction.   He had remarkable response and no toxicity to report He was discussed in the head and neck tumor board and the plan was to consider concurrent chemoradiation to the neck and radiation to the mediastinum given oligometastatic disease.   He clearly understands this is not necessarily curative in intent however may give him prolonged progression free interval.   He started weekly cisplatin  09/19/2020 He received 6 weekly cycles of cisplatin  concomintant with radiation. EOT PET with complete response  He is here for follow up. He had scans recently once again indicating no evidence of disease. We discussed about imaging every 6 months until year 5 given M1 disease After 5 yrs of no disease activity, we can do once a yr imaging He is good with this plan No concern for hypothyroidism, last TSH in Sep normal. FU in 1 yr or sooner as needed.   Amber Stalls MD'

## 2023-12-22 NOTE — Progress Notes (Signed)
 [name] OFFICE PROGRESS NOTE  Patient Care Team: de [place], Quintin PARAS, MD as PCP - General (Family Medicine) Malmfelt, Delon CROME, RN as Oncology Nurse Navigator Loretha Ash, MD as Consulting Physician (Hematology and Oncology) Ethyl Lonni BRAVO, MD (Inactive) as Consulting Physician (Otolaryngology) Izell Domino, MD as Consulting Physician (Radiation Oncology) Llewellyn Gerard LABOR, DO as Consulting Physician (Otolaryngology)  ASSESSMENT & PLAN:   Squamous cell carcinoma of base of tongue [name]) This is a very pleasant 63 year old male patient with past medical history significant for obstructive sleep apnea, otherwise in excellent health who was recently diagnosed with squamous cell carcinoma of the base of the tongue, p16 positive, 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    He completed 4 cycles of 5-FU/carbo/Keytruda  for induction.   He had remarkable response and no toxicity to report He was discussed in the head and neck tumor board and the plan was to consider concurrent chemoradiation to the neck and radiation to the mediastinum given oligometastatic disease.   He clearly understands this is not necessarily curative in intent however may give him prolonged progression free interval.   He started weekly cisplatin  09/19/2020 He received 6 weekly cycles of cisplatin  concomintant with radiation. EOT PET with complete response  He is here for follow up. He had scans recently once again indicating no evidence of disease. We discussed about imaging every 6 months until year 5 given M1 disease After 5 yrs of no disease activity, we can do once a yr imaging He is good with this plan No concern for hypothyroidism, last TSH in Sep normal. FU in 1 yr or sooner as needed.   Ash Loretha MD'   Orders Placed This Encounter  Procedures   CT  Chest W Contrast    Standing Status:   Future    Expected Date:   06/20/2024    Expiration Date:   12/21/2024    If indicated for the ordered procedure, I authorize the administration of contrast media per Radiology protocol:   Yes    Does the patient have a contrast media/X-ray dye allergy?:   No    Preferred imaging location?:   [facility]   CT Soft Tissue Neck W Contrast    Standing Status:   Future    Expected Date:   06/20/2024    Expiration Date:   12/21/2024    If indicated for the ordered procedure, I authorize the administration of contrast media per Radiology protocol:   Yes    Does the patient have a contrast media/X-ray dye allergy?:   No    Preferred imaging location?:   [facility]   Ash Loretha, MD 12/22/2023 3:10 PM  INTERVAL HISTORY:  Ms. Oros is here for follow-up.    History of Present Illness    Discussed the use of AI scribe software for clinical note transcription with the patient, who gave verbal consent to proceed.  History of Present Illness Alexander [name] is a 63 year old male in remission from head and neck cancer who presents for routine follow-up.  He is currently in remission from cancer, marking three years since his initial treatment. He has been following up with ENT specialists every six months, with the most recent visit occurring one to two weeks ago.  He experiences a recurrent bump on his head, which comes and goes. A dermatologist previously suggested it might  be a sebaceous cyst.  Earlier this year, he had a gastrointestinal illness during a cruise, causing him to miss one port due to being confined to his cabin for 24 hours. The illness was severe, involving 'projectile' symptoms, but he recovered and enjoyed the remainder of the cruise.  He has a history of nasal polyps, which fluctuate in size. He is currently using budesonide irrigation to manage this condition. No new symptoms or issues with his bowels or urination. He  is also under monitoring for thyroid  function, with the last check occurring in September.  His social history includes recent travel on a cruise.   ALLERGIES:  is allergic to tessalon [benzonatate].  MEDICATIONS:  Current Outpatient Medications  Medication Sig Dispense Refill   albuterol  (PROVENTIL ) (2.5 MG/3ML) 0.083% nebulizer solution Take 3 mLs (2.5 mg total) by nebulization every 6 (six) hours as needed for wheezing or shortness of breath. 120 mL 12   albuterol  (VENTOLIN  HFA) 108 (90 Base) MCG/ACT inhaler Inhale 2 puffs into the lungs as needed for wheezing or shortness of breath. 24 g 4   budesonide (PULMICORT) 0.5 MG/2ML nebulizer solution Take 0.5 mg by nebulization 2 (two) times daily.     cetirizine  (ZYRTEC ) 10 MG tablet Take 1 tablet (10 mg total) by mouth daily. 90 tablet 11   clindamycin (CLEOCIN T) 1 % SWAB Apply 1 Application topically in the morning. 60 each 2   cyclobenzaprine  (FLEXERIL ) 10 MG tablet Take 1 tablet (10 mg total) by mouth 2 (two) times daily as needed for muscle spasms. 30 tablet 2   doxycycline  (VIBRA -TABS) 100 MG tablet Take by mouth BID for 7 days for flares. 14 tablet 2   fluticasone  (FLONASE ) 50 MCG/ACT nasal spray Place 2 sprays into both nostrils daily. 16 g 6   Fluticasone -Umeclidin-Vilant (TRELEGY ELLIPTA ) 100-62.5-25 MCG/ACT AEPB Inhale into the lungs.     montelukast (SINGULAIR) 10 MG tablet Take 10 mg by mouth at bedtime.     omeprazole (PRILOSEC) 40 MG capsule Take 40 mg by mouth daily.     No current facility-administered medications for this visit.   Facility-Administered Medications Ordered in Other Visits  Medication Dose Route Frequency Provider Last Rate Last Admin   magnesium  sulfate 2 GM/50ML IVPB             SUMMARY OF ONCOLOGIC HISTORY: Oncology History  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)  05/17/2020 Initial Diagnosis   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)   05/18/2020 Cancer Staging   Staging form:  Pharynx - HPV-Mediated Oropharynx, AJCC 8th Edition - Clinical stage from 05/18/2020: Stage IV (cT4, cN3, cM1, p16+) - Signed by Izell Domino, MD on 12/10/2020   06/01/2020 Cancer Staging   Staging form: Pharynx - HPV-Mediated Oropharynx, AJCC 8th Edition - Pathologic stage from 06/01/2020: No Stage Recommended (cT4, cN3, cM1, p16+) - Signed by Deyja Sochacki, MD on 06/01/2020 Stage prefix: Initial diagnosis   06/12/2020 - 08/21/2020 Chemotherapy         09/10/2020 - 09/10/2020 Chemotherapy         09/19/2020 - 11/09/2020 Chemotherapy   Patient is on Treatment Plan : HEAD/NECK Cisplatin  q7d     02/15/2021 Imaging   Improved findings when compared to prior PET-CT suggesting a good response to treatment. Small focus of residual hypermetabolism in the anterior aspect of the floor the mouth and small residual slightly hypermetabolic level 2 lymph nodes. Recommend continued surveillance. No findings for metastatic disease involving the chest, abdomen or  pelvis.Small patchy infiltrates in the right lung.      PHYSICAL EXAMINATION: ECOG PERFORMANCE STATUS: 1 - Symptomatic but completely ambulatory  Vitals:   12/22/23 1407  BP: 121/79  Pulse: 85  Resp: 18  Temp: 98.3 F (36.8 C)  SpO2: 98%   Filed Weights   12/22/23 1407  Weight: 285 lb (129.3 kg)    Physical Exam Constitutional:      Appearance: Normal appearance.  HENT:     Head: Normocephalic and atraumatic.  Eyes:     Pupils: Pupils are equal, round, and reactive to light.  Cardiovascular:     Rate and Rhythm: Normal rate and regular rhythm.     Pulses: Normal pulses.     Heart sounds: Normal heart sounds.  Pulmonary:     Effort: Pulmonary effort is normal.     Breath sounds: Normal breath sounds.  Abdominal:     General: Abdomen is flat. Bowel sounds are normal.  Musculoskeletal:        General: Normal range of motion.     Cervical back: Normal range of motion and neck supple. No rigidity.  Lymphadenopathy:      Cervical: No cervical adenopathy.  Skin:    General: Skin is warm and dry.  Neurological:     General: No focal deficit present.     Mental Status: He is alert.      LABORATORY DATA:  I have reviewed the data as listed    Component Value Date/Time   NA 140 11/12/2023 0935   K 4.3 11/12/2023 0935   CL 103 11/12/2023 0935   CO2 24 11/12/2023 0935   GLUCOSE 96 11/12/2023 0935   GLUCOSE 115 (H) 12/18/2022 1331   BUN 16 11/12/2023 0935   CREATININE 1.36 (H) 11/12/2023 0935   CREATININE 1.33 (H) 12/18/2022 1331   CALCIUM 9.4 11/12/2023 0935   PROT 6.2 11/12/2023 0935   ALBUMIN  4.1 11/12/2023 0935   AST 17 11/12/2023 0935   AST 15 12/18/2022 1331   ALT 15 11/12/2023 0935   ALT 12 12/18/2022 1331   ALKPHOS 56 11/12/2023 0935   BILITOT 0.5 11/12/2023 0935   BILITOT 0.9 12/18/2022 1331   GFRNONAA >60 12/18/2022 1331    No results found for: SPEP, UPEP  Lab Results  Component Value Date   WBC 6.5 11/12/2023   NEUTROABS 4.6 11/12/2023   HGB 14.5 11/12/2023   HCT 44.4 11/12/2023   MCV 95 11/12/2023   PLT 153 11/12/2023      Chemistry      Component Value Date/Time   NA 140 11/12/2023 0935   K 4.3 11/12/2023 0935   CL 103 11/12/2023 0935   CO2 24 11/12/2023 0935   BUN 16 11/12/2023 0935   CREATININE 1.36 (H) 11/12/2023 0935   CREATININE 1.33 (H) 12/18/2022 1331      Component Value Date/Time   CALCIUM 9.4 11/12/2023 0935   ALKPHOS 56 11/12/2023 0935   AST 17 11/12/2023 0935   AST 15 12/18/2022 1331   ALT 15 11/12/2023 0935   ALT 12 12/18/2022 1331   BILITOT 0.5 11/12/2023 0935   BILITOT 0.9 12/18/2022 1331     I spent 30 minutes in the care of this patient including history, review of records, physical exam, counseling and coordination of care.  Amber Stalls MD

## 2023-12-29 ENCOUNTER — Encounter (HOSPITAL_BASED_OUTPATIENT_CLINIC_OR_DEPARTMENT_OTHER): Payer: PRIVATE HEALTH INSURANCE | Admitting: Family Medicine

## 2024-01-11 ENCOUNTER — Other Ambulatory Visit (INDEPENDENT_AMBULATORY_CARE_PROVIDER_SITE_OTHER): Payer: Self-pay | Admitting: Otolaryngology

## 2024-01-12 ENCOUNTER — Encounter (HOSPITAL_BASED_OUTPATIENT_CLINIC_OR_DEPARTMENT_OTHER): Admitting: Family Medicine

## 2024-01-12 ENCOUNTER — Ambulatory Visit (INDEPENDENT_AMBULATORY_CARE_PROVIDER_SITE_OTHER): Payer: PRIVATE HEALTH INSURANCE | Admitting: *Deleted

## 2024-01-12 ENCOUNTER — Encounter (HOSPITAL_BASED_OUTPATIENT_CLINIC_OR_DEPARTMENT_OTHER): Payer: Self-pay

## 2024-01-12 VITALS — Ht 75.0 in | Wt 285.0 lb

## 2024-01-12 DIAGNOSIS — Z0000 Encounter for general adult medical examination without abnormal findings: Secondary | ICD-10-CM

## 2024-01-12 NOTE — Patient Instructions (Signed)
 [name],  Thank you for taking the time for your Medicare Wellness Visit. I appreciate your continued commitment to your health goals. Please review the care plan we discussed, and feel free to reach out if I can assist you further.  Please note that Annual Wellness Visits do not include a physical exam. Some assessments may be limited, especially if the visit was conducted virtually. If needed, we may recommend an in-person follow-up with your provider.  Ongoing Care Seeing your primary care provider every 3 to 6 months helps us  monitor your health and provide consistent, personalized care.   Referrals If a referral was made during today's visit and you haven't received any updates within two weeks, please contact the referred provider directly to check on the status.  Recommended Screenings:  Health Maintenance  Topic Date Due   DTaP/Tdap/Td vaccine (1 - Tdap) Never done   COVID-19 Vaccine (5 - 2025-26 season) 10/19/2023   Medicare Annual Wellness Visit  01/11/2025   Colon Cancer Screening  03/29/2026   Pneumococcal Vaccine for age over 29  Completed   Flu Shot  Completed   Hepatitis C Screening  Completed   HIV Screening  Completed   Zoster (Shingles) Vaccine  Completed   Hepatitis B Vaccine  Aged Out   HPV Vaccine  Aged Out   Meningitis B Vaccine  Aged Out       01/12/2024    1:56 PM  Advanced Directives  Does Patient Have a Medical Advance Directive? No  Would patient like information on creating a medical advance directive? No - Patient declined    Vision: Annual vision screenings are recommended for early detection of glaucoma, cataracts, and diabetic retinopathy. These exams can also reveal signs of chronic conditions such as diabetes and high blood pressure.  Dental: Annual dental screenings help detect early signs of oral cancer, gum disease, and other conditions linked to overall health, including heart disease and diabetes.  Please see the attached documents  for additional preventive care recommendations.   [name] , Thank you for taking time to come for your Medicare Wellness Visit. I appreciate your ongoing commitment to your health goals. Please review the following plan we discussed and let me know if I can assist you in the future.   Screening recommendations/referrals: Colonoscopy:  Recommended yearly ophthalmology/optometry visit for glaucoma screening and checkup Recommended yearly dental visit for hygiene and checkup  Vaccinations: Influenza vaccine:  Pneumococcal vaccine:  Tdap vaccine:  Shingles vaccine:       Preventive Care 40-64 Years, Male Preventive care refers to lifestyle choices and visits with your health care provider that can promote health and wellness. What does preventive care include? A yearly physical exam. This is also called an annual well check. Dental exams once or twice a year. Routine eye exams. Ask your health care provider how often you should have your eyes checked. Personal lifestyle choices, including: Daily care of your teeth and gums. Regular physical activity. Eating a healthy diet. Avoiding tobacco and drug use. Limiting alcohol use. Practicing safe sex. Taking low-dose aspirin every day starting at age 67. What happens during an annual well check? The services and screenings done by your health care provider during your annual well check will depend on your age, overall health, lifestyle risk factors, and family history of disease. Counseling  Your health care provider may ask you questions about your: Alcohol use. Tobacco use. Drug use. Emotional well-being. Home and relationship well-being. Sexual activity. Eating habits. Work  and work environment. Screening  You may have the following tests or measurements: Height, weight, and BMI. Blood pressure. Lipid and cholesterol levels. These may be checked every 5 years, or more frequently if you are over 49 years old. Skin  check. Lung cancer screening. You may have this screening every year starting at age 83 if you have a 30-pack-year history of smoking and currently smoke or have quit within the past 15 years. Fecal occult blood test (FOBT) of the stool. You may have this test every year starting at age 11. Flexible sigmoidoscopy or colonoscopy. You may have a sigmoidoscopy every 5 years or a colonoscopy every 10 years starting at age 66. Prostate cancer screening. Recommendations will vary depending on your family history and other risks. Hepatitis C blood test. Hepatitis B blood test. Sexually transmitted disease (STD) testing. Diabetes screening. This is done by checking your blood sugar (glucose) after you have not eaten for a while (fasting). You may have this done every 1-3 years. Discuss your test results, treatment options, and if necessary, the need for more tests with your health care provider. Vaccines  Your health care provider may recommend certain vaccines, such as: Influenza vaccine. This is recommended every year. Tetanus, diphtheria, and acellular pertussis (Tdap, Td) vaccine. You may need a Td booster every 10 years. Zoster vaccine. You may need this after age 75. Pneumococcal 13-valent conjugate (PCV13) vaccine. You may need this if you have certain conditions and have not been vaccinated. Pneumococcal polysaccharide (PPSV23) vaccine. You may need one or two doses if you smoke cigarettes or if you have certain conditions. Talk to your health care provider about which screenings and vaccines you need and how often you need them. This information is not intended to replace advice given to you by your health care provider. Make sure you discuss any questions you have with your health care provider. Document Released: 03/02/2015 Document Revised: 10/24/2015 Document Reviewed: 12/05/2014 Elsevier Interactive Patient Education  2017 [employer].  Fall Prevention in the Home Falls can cause  injuries. They can happen to people of all ages. There are many things you can do to make your home safe and to help prevent falls. What can I do on the outside of my home? Regularly fix the edges of walkways and driveways and fix any cracks. Remove anything that might make you trip as you walk through a door, such as a raised step or threshold. Trim any bushes or trees on the path to your home. Use bright outdoor lighting. Clear any walking paths of anything that might make someone trip, such as rocks or tools. Regularly check to see if handrails are loose or broken. Make sure that both sides of any steps have handrails. Any raised decks and porches should have guardrails on the edges. Have any leaves, snow, or ice cleared regularly. Use sand or salt on walking paths during winter. Clean up any spills in your garage right away. This includes oil or grease spills. What can I do in the bathroom? Use night lights. Install grab bars by the toilet and in the tub and shower. Do not use towel bars as grab bars. Use non-skid mats or decals in the tub or shower. If you need to sit down in the shower, use a plastic, non-slip stool. Keep the floor dry. Clean up any water  that spills on the floor as soon as it happens. Remove soap buildup in the tub or shower regularly. Attach bath mats securely with double-sided  non-slip rug tape. Do not have throw rugs and other things on the floor that can make you trip. What can I do in the bedroom? Use night lights. Make sure that you have a light by your bed that is easy to reach. Do not use any sheets or blankets that are too big for your bed. They should not hang down onto the floor. Have a firm chair that has side arms. You can use this for support while you get dressed. Do not have throw rugs and other things on the floor that can make you trip. What can I do in the kitchen? Clean up any spills right away. Avoid walking on wet floors. Keep items that you  use a lot in easy-to-reach places. If you need to reach something above you, use a strong step stool that has a grab bar. Keep electrical cords out of the way. Do not use floor polish or wax that makes floors slippery. If you must use wax, use non-skid floor wax. Do not have throw rugs and other things on the floor that can make you trip. What can I do with my stairs? Do not leave any items on the stairs. Make sure that there are handrails on both sides of the stairs and use them. Fix handrails that are broken or loose. Make sure that handrails are as long as the stairways. Check any carpeting to make sure that it is firmly attached to the stairs. Fix any carpet that is loose or worn. Avoid having throw rugs at the top or bottom of the stairs. If you do have throw rugs, attach them to the floor with carpet tape. Make sure that you have a light switch at the top of the stairs and the bottom of the stairs. If you do not have them, ask someone to add them for you. What else can I do to help prevent falls? Wear shoes that: Do not have high heels. Have rubber bottoms. Are comfortable and fit you well. Are closed at the toe. Do not wear sandals. If you use a stepladder: Make sure that it is fully opened. Do not climb a closed stepladder. Make sure that both sides of the stepladder are locked into place. Ask someone to hold it for you, if possible. Clearly mark and make sure that you can see: Any grab bars or handrails. First and last steps. Where the edge of each step is. Use tools that help you move around (mobility aids) if they are needed. These include: Canes. Walkers. Scooters. Crutches. Turn on the lights when you go into a dark area. Replace any light bulbs as soon as they burn out. Set up your furniture so you have a clear path. Avoid moving your furniture around. If any of your floors are uneven, fix them. If there are any pets around you, be aware of where they are. Review your  medicines with your doctor. Some medicines can make you feel dizzy. This can increase your chance of falling. Ask your doctor what other things that you can do to help prevent falls. This information is not intended to replace advice given to you by your health care provider. Make sure you discuss any questions you have with your health care provider. Document Released: 11/30/2008 Document Revised: 07/12/2015 Document Reviewed: 03/10/2014 Elsevier Interactive Patient Education  2017 [employer].

## 2024-01-12 NOTE — Progress Notes (Signed)
 Chief Complaint  Patient presents with   Medicare Wellness     Subjective:   Alexander [name] is a 63 y.o. male who presents for a Medicare Annual Wellness Visit.  Allergies (verified) Tessalon [benzonatate]   History: Past Medical History:  Diagnosis Date   Asthma    Cancer (HCC)    GERD (gastroesophageal reflux disease)    Sleep apnea    CPAP- does not wear   Past Surgical History:  Procedure Laterality Date   BRONCHIAL NEEDLE ASPIRATION BIOPSY  05/22/2020   Procedure: BRONCHIAL NEEDLE ASPIRATION BIOPSIES;  Surgeon: Brenna Adine CROME, DO;  Location: [facility];  Service: Pulmonary;;   DIRECT LARYNGOSCOPY  05/09/2020   Procedure: DIRECT LARYNGOSCOPY;  Surgeon: Ethyl Lonni BRAVO, MD;  Location: [facility] OR;  Service: ENT;;   IR GASTROSTOMY TUBE MOD SED  09/13/2020   IR GASTROSTOMY TUBE REMOVAL  12/14/2020   IR IMAGING GUIDED PORT INSERTION  06/12/2020   IR REMOVAL TUN ACCESS W/ PORT W/O FL MOD SED  01/09/2023   MASS EXCISION N/A 05/09/2020   Procedure: EXCISION TONGUE MASS;  Surgeon: Ethyl Lonni BRAVO, MD;  Location: [facility] OR;  Service: ENT;  Laterality: N/A;   TONSILLECTOMY     TRACHEOSTOMY TUBE PLACEMENT  05/09/2020   Procedure: TRACHEOSTOMY;  Surgeon: Ethyl Lonni BRAVO, MD;  Location: [facility] OR;  Service: ENT;;   VIDEO BRONCHOSCOPY WITH ENDOBRONCHIAL ULTRASOUND N/A 05/22/2020   Procedure: VIDEO BRONCHOSCOPY WITH ENDOBRONCHIAL ULTRASOUND;  Surgeon: Brenna Adine CROME, DO;  Location: [facility];  Service: Pulmonary;  Laterality: N/A;   Family History  Problem Relation Age of Onset   Breast cancer Mother    Cancer - Cervical Mother    Colon cancer Father    Colon cancer Brother    Social History   Occupational History   Not on file  Tobacco Use   Smoking status: Never    Passive exposure: Never   Smokeless tobacco: Never  Vaping Use   Vaping status: Never Used  Substance and Sexual Activity   Alcohol use: Not Currently    Comment: 4-5 drinks a year for special  ocassions 05/08/20   Drug use: Never   Sexual activity: Not Currently   Tobacco Counseling Counseling given: Not Answered  SDOH Screenings   Food Insecurity: No Food Insecurity (01/12/2024)  Housing: Unknown (01/12/2024)  Transportation Needs: No Transportation Needs (01/12/2024)  Utilities: Not At Risk (01/12/2024)  Alcohol Screen: Low Risk  (12/23/2022)  Depression (PHQ2-9): Low Risk  (01/12/2024)  Financial Resource Strain: Low Risk  (12/23/2022)  Physical Activity: Insufficiently Active (01/12/2024)  Social Connections: Socially Integrated (01/12/2024)  Stress: No Stress Concern Present (01/12/2024)  Tobacco Use: Low Risk  (01/12/2024)  Health Literacy: Adequate Health Literacy (01/12/2024)   See flowsheets for full screening details  Depression Screen PHQ 2 & 9 Depression Scale- Over the past 2 weeks, how often have you been bothered by any of the following problems? Little interest or pleasure in doing things: 0 Feeling down, depressed, or hopeless (PHQ Adolescent also includes...irritable): 0 PHQ-2 Total Score: 0 Trouble falling or staying asleep, or sleeping too much: 0 Feeling tired or having little energy: 0 Poor appetite or overeating (PHQ Adolescent also includes...weight loss): 0 Feeling bad about yourself - or that you are a failure or have let yourself or your family down: 0 Trouble concentrating on things, such as reading the newspaper or watching television (PHQ Adolescent also includes...like school work): 0 Moving or speaking so slowly that other people could have noticed.  Or the opposite - being so fidgety or restless that you have been moving around a lot more than usual: 0 Thoughts that you would be better off dead, or of hurting yourself in some way: 0 PHQ-9 Total Score: 0 If you checked off any problems, how difficult have these problems made it for you to do your work, take care of things at home, or get along with other people?: Not difficult at all      Goals Addressed             This Visit's Progress    Weight (lb) < 200 lb (90.7 kg)         Visit info / Clinical Intake: Medicare Wellness Visit Type:: Subsequent Annual Wellness Visit Persons participating in visit:: patient Medicare Wellness Visit Mode:: Telephone If telephone:: video declined Because this visit was a virtual/telehealth visit:: unable to obtan vitals due to lack of equipment If Telephone or Video please confirm:: I connected with the patient using audio enabled telemedicine application and verified that I am speaking with the correct person using two identifiers; I discussed the limitations of evaluation and management by telemedicine; The patient expressed understanding and agreed to proceed Patient Location:: home Provider Location:: home Information given by:: patient Interpreter Needed?: No Pre-visit prep was completed: no AWV questionnaire completed by patient prior to visit?: no Living arrangements:: lives with spouse/significant other Patient's Overall Health Status Rating: good Typical amount of pain: none Does pain affect daily life?: no Are you currently prescribed opioids?: no  Dietary Habits and Nutritional Risks How many meals a day?: 2 Eats fruit and vegetables daily?: yes Most meals are obtained by: preparing own meals; eating out In the last 2 weeks, have you had any of the following?: none Diabetic:: no  Functional Status Activities of Daily Living (to include ambulation/medication): Independent Ambulation: Independent Medication Administration: Independent Home Management: Independent Primary transportation is: driving Concerns about vision?: no *vision screening is required for WTM* Concerns about hearing?: no  Fall Screening Falls in the past year?: 0 Number of falls in past year: 0 Was there an injury with Fall?: 0 Fall Risk Category Calculator: 0 Patient Fall Risk Level: Low Fall Risk  Fall Risk Patient at Risk for Falls  Due to: No Fall Risks Fall risk Follow up: Falls evaluation completed; Education provided; Falls prevention discussed  Home and Transportation Safety: All rugs have non-skid backing?: yes All stairs or steps have railings?: yes Grab bars in the bathtub or shower?: (!) no Have non-skid surface in bathtub or shower?: yes Good home lighting?: yes Regular seat belt use?: yes Hospital stays in the last year:: no  Cognitive Assessment Difficulty concentrating, remembering, or making decisions? : no Will 6CIT or Mini Cog be Completed: yes What year is it?: 0 points What month is it?: 0 points Give patient an address phrase to remember (5 components): its very sunny outside today in November About what time is it?: 0 points Count backwards from 20 to 1: 0 points Say the months of the year in reverse: 0 points Repeat the address phrase from earlier: 0 points 6 CIT Score: 0 points  Advance Directives (For Healthcare) Does Patient Have a Medical Advance Directive?: No Would patient like information on creating a medical advance directive?: No - Patient declined  Reviewed/Updated  Reviewed/Updated: Reviewed All (Medical, Surgical, Family, Medications, Allergies, Care Teams, Patient Goals); Surgical History; Family History; Medications; Allergies; Care Teams; Patient Goals; Medical History  Objective:    Today's Vitals   01/12/24 1353  Weight: 285 lb (129.3 kg)  Height: 6' 3 (1.905 m)   Body mass index is 35.62 kg/m.  Current Medications (verified) Outpatient Encounter Medications as of 01/12/2024  Medication Sig   albuterol  (PROVENTIL ) (2.5 MG/3ML) 0.083% nebulizer solution Take 3 mLs (2.5 mg total) by nebulization every 6 (six) hours as needed for wheezing or shortness of breath.   albuterol  (VENTOLIN  HFA) 108 (90 Base) MCG/ACT inhaler Inhale 2 puffs into the lungs as needed for wheezing or shortness of breath.   budesonide (PULMICORT) 0.5 MG/2ML nebulizer solution Take  0.5 mg by nebulization 2 (two) times daily.   cetirizine  (ZYRTEC ) 10 MG tablet TAKE 1 TABLET DAILY   clindamycin  (CLEOCIN  T) 1 % SWAB Apply 1 Application topically in the morning.   cyclobenzaprine  (FLEXERIL ) 10 MG tablet Take 1 tablet (10 mg total) by mouth 2 (two) times daily as needed for muscle spasms.   doxycycline  (VIBRA -TABS) 100 MG tablet Take by mouth BID for 7 days for flares.   fluticasone  (FLONASE ) 50 MCG/ACT nasal spray Place 2 sprays into both nostrils daily.   Fluticasone -Umeclidin-Vilant (TRELEGY ELLIPTA ) 100-62.5-25 MCG/ACT AEPB Inhale into the lungs.   montelukast (SINGULAIR) 10 MG tablet Take 10 mg by mouth at bedtime.   omeprazole (PRILOSEC) 40 MG capsule Take 40 mg by mouth daily.   Facility-Administered Encounter Medications as of 01/12/2024  Medication   magnesium  sulfate 2 GM/50ML IVPB   Hearing/Vision screen Hearing Screening - Comments:: No trouble hearing Vision Screening - Comments:: [facility]  Up to date Immunizations and Health Maintenance Health Maintenance  Topic Date Due   DTaP/Tdap/Td (1 - Tdap) Never done   COVID-19 Vaccine (5 - 2025-26 season) 10/19/2023   Medicare Annual Wellness (AWV)  01/11/2025   Colonoscopy  03/29/2026   Pneumococcal Vaccine: 50+ Years  Completed   Influenza Vaccine  Completed   Hepatitis C Screening  Completed   HIV Screening  Completed   Zoster Vaccines- Shingrix  Completed   Hepatitis B Vaccines 19-59 Average Risk  Aged Out   HPV VACCINES  Aged Out   Meningococcal B Vaccine  Aged Out        Assessment/Plan:  This is a routine wellness examination for Alexander [name].  Patient Care Team: [place], Quintin PARAS, MD as PCP - General (Family Medicine) Malmfelt, Delon CROME, RN as Oncology Nurse Navigator Loretha Ash, MD as Consulting Physician (Hematology and Oncology) Ethyl Lonni BRAVO, MD (Inactive) as Consulting Physician (Otolaryngology) Izell Domino, MD as Consulting Physician (Radiation Oncology) Llewellyn Gerard LABOR,  DO as Consulting Physician (Otolaryngology)  I have personally reviewed and noted the following in the patient's chart:   Medical and social history Use of alcohol, tobacco or illicit drugs  Current medications and supplements including opioid prescriptions. Functional ability and status Nutritional status Physical activity Advanced directives List of other physicians Hospitalizations, surgeries, and ER visits in previous 12 months Vitals Screenings to include cognitive, depression, and falls Referrals and appointments  No orders of the defined types were placed in this encounter.  In addition, I have reviewed and discussed with patient certain preventive protocols, quality metrics, and best practice recommendations. A written personalized care plan for preventive services as well as general preventive health recommendations were provided to patient.   Mliss Graff, LPN   [id number]   Return in 1 year (on 01/11/2025).  After Visit Summary: (MyChart) Due to this being a telephonic visit, the after visit summary with patients personalized plan  was offered to patient via MyChart   Nurse Notes:

## 2024-01-18 ENCOUNTER — Encounter (HOSPITAL_BASED_OUTPATIENT_CLINIC_OR_DEPARTMENT_OTHER): Admitting: Family Medicine

## 2024-01-27 ENCOUNTER — Ambulatory Visit (INDEPENDENT_AMBULATORY_CARE_PROVIDER_SITE_OTHER): Payer: PRIVATE HEALTH INSURANCE | Admitting: Family Medicine

## 2024-01-27 ENCOUNTER — Other Ambulatory Visit (HOSPITAL_BASED_OUTPATIENT_CLINIC_OR_DEPARTMENT_OTHER): Payer: Self-pay

## 2024-01-27 DIAGNOSIS — Z23 Encounter for immunization: Secondary | ICD-10-CM | POA: Diagnosis not present

## 2024-01-27 MED ORDER — COMIRNATY 30 MCG/0.3ML IM SUSY
0.3000 mL | PREFILLED_SYRINGE | Freq: Once | INTRAMUSCULAR | 0 refills | Status: AC
  Filled 2024-01-27: qty 0.3, 1d supply, fill #0

## 2024-01-27 NOTE — Progress Notes (Signed)
 Patient is in office today for a nurse visit for Tdap Immunization. Patient Injection was given in the  Left deltoid. Patient tolerated injection well.

## 2024-05-19 ENCOUNTER — Ambulatory Visit (HOSPITAL_BASED_OUTPATIENT_CLINIC_OR_DEPARTMENT_OTHER): Payer: PRIVATE HEALTH INSURANCE | Admitting: Family Medicine

## 2024-06-20 ENCOUNTER — Ambulatory Visit: Payer: PRIVATE HEALTH INSURANCE | Admitting: Dermatology

## 2024-06-21 ENCOUNTER — Inpatient Hospital Stay: Payer: PRIVATE HEALTH INSURANCE | Admitting: Hematology and Oncology
